# Patient Record
Sex: Male | Born: 1950 | Race: Black or African American | Hispanic: No | Marital: Married | Smoking: Former smoker
Health system: Southern US, Community
[De-identification: ages and names within clinical notes are randomized; demographics above are authoritative.]

## PROBLEM LIST (undated history)

## (undated) DIAGNOSIS — N189 Chronic kidney disease, unspecified: Secondary | ICD-10-CM

## (undated) DIAGNOSIS — I5030 Unspecified diastolic (congestive) heart failure: Secondary | ICD-10-CM

## (undated) DIAGNOSIS — G4733 Obstructive sleep apnea (adult) (pediatric): Secondary | ICD-10-CM

## (undated) DIAGNOSIS — I10 Essential (primary) hypertension: Secondary | ICD-10-CM

## (undated) DIAGNOSIS — I2510 Atherosclerotic heart disease of native coronary artery without angina pectoris: Secondary | ICD-10-CM

## (undated) DIAGNOSIS — R9431 Abnormal electrocardiogram [ECG] [EKG]: Secondary | ICD-10-CM

## (undated) DIAGNOSIS — R37 Sexual dysfunction, unspecified: Secondary | ICD-10-CM

## (undated) HISTORY — PX: CARDIAC CATHETERIZATION: SHX172

## (undated) HISTORY — DX: Unspecified diastolic (congestive) heart failure: I50.30

## (undated) HISTORY — DX: Essential (primary) hypertension: I10

## (undated) HISTORY — DX: Obstructive sleep apnea (adult) (pediatric): G47.33

## (undated) HISTORY — DX: Atherosclerotic heart disease of native coronary artery without angina pectoris: I25.10

## (undated) HISTORY — DX: Abnormal electrocardiogram (ECG) (EKG): R94.31

## (undated) HISTORY — DX: Sexual dysfunction, unspecified: R37

## (undated) HISTORY — PX: CORONARY ANGIOPLASTY: SHX604

## (undated) HISTORY — DX: Chronic kidney disease, unspecified: N18.9

---
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
## 2004-08-20 ENCOUNTER — Ambulatory Visit: Payer: Self-pay | Admitting: Family Medicine

## 2009-03-04 DIAGNOSIS — R9431 Abnormal electrocardiogram [ECG] [EKG]: Secondary | ICD-10-CM

## 2009-03-04 HISTORY — DX: Abnormal electrocardiogram (ECG) (EKG): R94.31

## 2009-03-06 ENCOUNTER — Ambulatory Visit: Payer: Self-pay | Admitting: Cardiovascular Disease

## 2010-04-15 ENCOUNTER — Encounter: Payer: Self-pay | Admitting: Cardiovascular Disease

## 2010-06-16 NOTE — Assessment & Plan Note (Signed)
[facility] OFFICE NOTE   Villa, Douglas                    MRN:          [id number]  DATE:03/06/2009                            DOB:          1950-05-31    CHIEF COMPLAINT:  Shortness of breath and abnormal EKG.   HISTORY OF PRESENT ILLNESS:  Mr. Douglas Villa is a 60 year old black male  with past medical history significant for hypertension, hyperlipidemia,  presenting upon referral from primary care with abnormal EKG and  occasional shortness of breath with exertion.  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  There is no associated chest  discomfort, diaphoresis, or nausea.  He was seen by primary care, who  noticed T-wave inversions in some of the precordial leads and  questionable Q-waves inferiorly.   PAST MEDICAL HISTORY:  As above in HPI.  The patient has hypogonadism  and is pre-diabetic.   SOCIAL HISTORY:  The patient smokes about a pack of cigarettes a day.  He states he was able to quit for 5 years in the past, but then  restarted smoking.  Alcohol; minimal alcohol use.   FAMILY HISTORY:  Negative for premature coronary artery disease.   ALLERGIES:  PENICILLIN.   MEDICATIONS:  1. Aspirin 325 daily.  2. Zocor 40 mg nightly.  3. Hydrochlorothiazide 25 mg daily.  4. Amlodipine/?10/20 mg daily.  5. Potassium 10 mg daily.  6. Multivitamins daily.   REVIEW OF SYSTEMS:  As in HPI.  The patient also has endorsed erectile  problems and poor diet.  Other systems are negative.   PHYSICAL EXAMINATION:  VITAL SIGNS:  His blood pressure 157/96 in the  right arm, 149/80 in the left arm, pulse 63, sating 98% on room air, and  he weighs 217 pounds.  GENERAL:  No acute distress.  HEENT:  Normocephalic, atraumatic.  NECK:  Supple.  HEART:  Regular rate and rhythm without murmur, rub, or gallop.  LUNGS:   Clear bilaterally.  ABDOMEN:  Soft, nontender, nondistended.  EXTREMITIES:  Without edema.  SKIN:  Warm and dry.   EKG independently interpreted by myself demonstrates normal sinus  rhythm.  There is T-wave inversion in V4-V5 and baseline artifact in V6.   ASSESSMENT:  This is a 60 year old gentleman with a multiple risk  factors for coronary artery disease presenting with a nonspecific  findings on EKG that could represent ischemia.  Symptomatically, the  patient has some dyspnea which is likely secondary to deconditioning.   PLAN:  We will proceed with a exercise stress echocardiogram in order to  rule out inducible ischemia and ensure normal left ventricular systolic  function.  The patient is advised that if he were to experience any new-  onset chest, arm, or jaw discomfort or if he were to become acutely  short of breath, he should call 9-1-1.  We will contact the patient once  the results of the stress test are obtained.     Arlee Muslim, MD  Electronically Signed    SGA/MedQ  DD: 03/06/2009  DT: 03/07/2009  Job #: [id number]

## 2010-06-16 NOTE — Letter (Signed)
March 06, 2009    Dr. Reinaldo Meeker  [address], [place]  [address]   RE:  Douglas Villa, Douglas Villa  MRN:  [id number]  /  DOB:  05-Sep-1950   Dear Dr. Henrene Pastor:   I had the pleasure of seeing Douglas Villa in clinic this morning.  As  you know he is a 60 year old gentleman with multiple risk factors for  coronary disease who has some T-wave inversions in the precordial leads  on his EKG.  In speaking with the patient he does get dyspneic with  exertion but denies any chest discomfort.  I suspect that his dyspnea is  from physical deconditioning.  Because of the abnormal EKG I will  proceed with exercise echocardiogram in order to assure normal left  ventricular systolic function and rule out inducible ischemia.   Thank you for the referral of this patient and please contact my office  if I can be of further assistance.    Sincerely,      Arlee Muslim, MD  Electronically Signed    SGA/MedQ  DD: 03/06/2009  DT: 03/06/2009  Job #: [id number]

## 2017-06-03 ENCOUNTER — Ambulatory Visit (INDEPENDENT_AMBULATORY_CARE_PROVIDER_SITE_OTHER): Payer: Self-pay | Admitting: Cardiology

## 2017-06-03 ENCOUNTER — Encounter: Payer: Self-pay | Admitting: Cardiology

## 2017-06-03 VITALS — BP 118/72 | HR 50 | Ht 67.0 in | Wt 198.1 lb

## 2017-06-03 DIAGNOSIS — E785 Hyperlipidemia, unspecified: Secondary | ICD-10-CM | POA: Insufficient documentation

## 2017-06-03 DIAGNOSIS — Z87891 Personal history of nicotine dependence: Secondary | ICD-10-CM

## 2017-06-03 DIAGNOSIS — N183 Chronic kidney disease, stage 3 unspecified: Secondary | ICD-10-CM | POA: Insufficient documentation

## 2017-06-03 DIAGNOSIS — C61 Malignant neoplasm of prostate: Secondary | ICD-10-CM

## 2017-06-03 DIAGNOSIS — I10 Essential (primary) hypertension: Secondary | ICD-10-CM | POA: Insufficient documentation

## 2017-06-03 DIAGNOSIS — R0602 Shortness of breath: Secondary | ICD-10-CM

## 2017-06-03 DIAGNOSIS — E782 Mixed hyperlipidemia: Secondary | ICD-10-CM | POA: Insufficient documentation

## 2017-06-03 DIAGNOSIS — I5023 Acute on chronic systolic (congestive) heart failure: Secondary | ICD-10-CM

## 2017-06-03 DIAGNOSIS — I2510 Atherosclerotic heart disease of native coronary artery without angina pectoris: Secondary | ICD-10-CM

## 2017-06-03 DIAGNOSIS — I25110 Atherosclerotic heart disease of native coronary artery with unstable angina pectoris: Secondary | ICD-10-CM | POA: Insufficient documentation

## 2017-06-03 NOTE — Addendum Note (Signed)
Addended by: Mattie Marlin on: 06/03/2017 10:48 AM   Modules accepted: Orders

## 2017-06-03 NOTE — Patient Instructions (Signed)
Medication Instructions:  Your physician recommends that you continue on your current medications as directed. Please refer to the Current Medication list given to you today.  Labwork: None  Testing/Procedures: None  Follow-Up: Your physician recommends that you schedule a follow-up appointment in: 4 weeks  Any Other Special Instructions Will Be Listed Below (If Applicable).   Referral order has been placed for pulmonary. Their office should be contacting you regarding an appointment. If you do not hear from their office within one week please feel free to call our office.  If you need a refill on your cardiac medications before your next appointment, please call your pharmacy.   [place], RN, BSN

## 2017-06-03 NOTE — Progress Notes (Signed)
Cardiology Office Note:    Date:  06/03/2017   ID:  Douglas Villa, DOB May 04, 1950, MRN [id number]  PCP:  Lillard Anes, MD  Cardiologist:  Jenean Lindau, MD   Referring MD: Lillard Anes,*    ASSESSMENT:    1. Hyperlipidemia, unspecified hyperlipidemia type   2. Hypercholesteremia   3. Hypertension, unspecified type   4. Acute on chronic systolic heart failure ([place])   5. Chronic kidney disease, stage 3 ([place])   6. Hypokalemia   7. 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[place])    PLAN:    In order of problems listed above:  1. Patient has multiple complex medical problems.  Secondary prevention stressed to the patient.  Importance of compliance with diet and medications stressed and he vocalized understanding.  His blood pressure is stable.  I told him never to go back to smoking.  Diet was discussed with dyslipidemia diabetes mellitus and obesity she was followed by his primary care physician. 2. He is chronically insufficiency again this is monitored closely by his primary care doctor.  The patient has had his diuretic changed to furosemide for shortness of breath and pedal edema and is already feeling much better. 3. At this time I do not see any reason for any acute intervention.  I will try to get copy of the cardiac records from the hospital where he underwent stenting.  I would also like to see if there is an echocardiogram to assess his wall motion and ejection fraction. 4. He will be seen in follow-up appointment in 3 to 4 weeks or earlier if he has any concerns.  Congestive heart failure education was given and also diet and salt indications were discussed.  He knows to go to the nearest emergency room for any concerning symptoms. 5. He has a long-standing history of smoking and has shortness of breath on exertion and that would like to see a pulmonologist and we will make him an appointment with pulmonologist..  He is agreeable to this.   Medication  Adjustments/Labs and Tests Ordered: Current medicines are reviewed at length with the patient today.  Concerns regarding medicines are outlined above.  No orders of the defined types were placed in this encounter.  No orders of the defined types were placed in this encounter.    History of Present Illness:    Douglas Villa is a 67 y.o. male who is being seen today for the evaluation of coronary artery disease post recent coronary stenting in March of this year.  At the request of Lillard Anes,*.  Patient is a pleasant 67 year old male.  He has past medical history of essential hypertension, dyslipidemia, diabetes mellitus and smoking.  He tells me that he quit smoking in the month of February or March.  He went to the hospital admitted and underwent coronary stenting.  He also has significant renal insufficiency.  He is here for follow-up for this and to get established.  No chest pain or orthopnea or PND.  He has long-standing bilateral pedal edema.  He has some shortness of breath on exertion.  His medicine was changed from hydrochlorothiazide to furosemide and he is taken his first dose this morning and feels much better.  At the time of my evaluation, the patient is alert awake oriented and in no distress.  Past Medical History:  Diagnosis Date  . Abnormal EKG 03/2009  . Hypertension   . Sexual dysfunction   . SOB (shortness of breath)  Past Surgical History:  Procedure Laterality Date  . APPENDECTOMY  1969    Current Medications: Current Meds  Medication Sig  . ACCU-CHEK FASTCLIX LANCETS MISC by Does not apply route.  Marland Kitchen aspirin 325 MG tablet Take 325 mg by mouth daily.    Marland Kitchen atorvastatin (LIPITOR) 40 MG tablet Take 1 tablet by mouth daily.  . Blood Glucose Monitoring Suppl (ACCU-CHEK AVIVA PLUS) w/Device KIT by Does not apply route.  . carvedilol (COREG) 12.5 MG tablet Take 1 tablet by mouth daily.  . furosemide (LASIX) 40 MG tablet Take 1 tablet by mouth 2 (two)  times daily.   Marland Kitchen glucose blood test strip 1 each by Other route as needed. Use as instructed  . Multiple Vitamin (MULTIVITAMIN) capsule Take 1 capsule by mouth daily.    . nitroGLYCERIN (NITROSTAT) 0.4 MG SL tablet Place 0.4 mg under the tongue every 5 (five) minutes as needed for chest pain.  . [DISCONTINUED] amLODipine-benazepril (LOTREL) 10-20 MG per capsule Take 1 capsule by mouth daily.    . [DISCONTINUED] hydrochlorothiazide (HYDRODIURIL) 25 MG tablet Take 25 mg by mouth daily.  . [DISCONTINUED] lisinopril (PRINIVIL,ZESTRIL) 10 MG tablet Take 10 mg by mouth daily.     Allergies:   Penicillins   Social History   Socioeconomic History  . Marital status: Single    Spouse name: Not on file  . Number of children: Not on file  . Years of education: Not on file  . Highest education level: Not on file  Occupational History  . Not on file  Social Needs  . Financial resource strain: Not on file  . Food insecurity:    Worry: Not on file    Inability: Not on file  . Transportation needs:    Medical: Not on file    Non-medical: Not on file  Tobacco Use  . Smoking status: Former Smoker    Packs/day: 1.00    Years: 40.00    Pack years: 40.00    Types: Cigarettes  . Smokeless tobacco: Never Used  Substance and Sexual Activity  . Alcohol use: No  . Drug use: No  . Sexual activity: Not on file  Lifestyle  . Physical activity:    Days per week: Not on file    Minutes per session: Not on file  . Stress: Not on file  Relationships  . Social connections:    Talks on phone: Not on file    Gets together: Not on file    Attends religious service: Not on file    Active member of club or organization: Not on file    Attends meetings of clubs or organizations: Not on file    Relationship status: Not on file  Other Topics Concern  . Not on file  Social History Narrative  . Not on file     Family History: The patient's family history includes Cancer (age of onset: 58) in his  mother; Stroke (age of onset: 78) in his father.  ROS:   Please see the history of present illness.    All other systems reviewed and are negative.  EKGs/Labs/Other Studies Reviewed:    The following studies were reviewed today: EKG revealed sinus rhythm and nonspecific ST-T changes.   Recent Labs: No results found for requested labs within last 8760 hours.  Recent Lipid Panel No results found for: CHOL, TRIG, HDL, CHOLHDL, VLDL, LDLCALC, LDLDIRECT  Physical Exam:    VS:  BP 118/72 (BP Location: Left Arm, Patient Position: Sitting, Cuff  Size: Normal)   Pulse (!) 50   Ht _0  (1.702 m)   Wt 198 lb 1.9 oz (89.9 kg)   SpO2 98%   BMI 31.03 kg/m     Wt Readings from Last 3 Encounters:  06/03/17 198 lb 1.9 oz (89.9 kg)     GEN: Patient is in no acute distress HEENT: Normal NECK: No JVD; No carotid bruits LYMPHATICS: No lymphadenopathy CARDIAC: S1 S2 regular, 2/6 systolic murmur at the apex. RESPIRATORY:  Clear to auscultation without rales, wheezing or rhonchi  ABDOMEN: Soft, non-tender, non-distended MUSCULOSKELETAL:  No edema; No deformity  SKIN: Warm and dry NEUROLOGIC:  Alert and oriented x 3 PSYCHIATRIC:  Normal affect    Signed, Jenean Lindau, MD  06/03/2017 10:38 AM    [facility]

## 2017-06-30 ENCOUNTER — Ambulatory Visit (INDEPENDENT_AMBULATORY_CARE_PROVIDER_SITE_OTHER): Payer: Self-pay | Admitting: Pulmonary Disease

## 2017-06-30 ENCOUNTER — Encounter: Payer: Self-pay | Admitting: Pulmonary Disease

## 2017-06-30 ENCOUNTER — Ambulatory Visit (HOSPITAL_BASED_OUTPATIENT_CLINIC_OR_DEPARTMENT_OTHER)
Admission: RE | Admit: 2017-06-30 | Discharge: 2017-06-30 | Disposition: A | Discharge status: Home / Self Care | Payer: Self-pay | Source: Ambulatory Visit | Attending: Pulmonary Disease | Admitting: Pulmonary Disease

## 2017-06-30 VITALS — BP 112/74 | HR 81 | Ht 67.0 in | Wt 184.0 lb

## 2017-06-30 DIAGNOSIS — R0609 Other forms of dyspnea: Secondary | ICD-10-CM

## 2017-06-30 DIAGNOSIS — J90 Pleural effusion, not elsewhere classified: Secondary | ICD-10-CM | POA: Insufficient documentation

## 2017-06-30 DIAGNOSIS — R0602 Shortness of breath: Secondary | ICD-10-CM | POA: Insufficient documentation

## 2017-06-30 DIAGNOSIS — Z87891 Personal history of nicotine dependence: Secondary | ICD-10-CM | POA: Insufficient documentation

## 2017-06-30 DIAGNOSIS — I509 Heart failure, unspecified: Secondary | ICD-10-CM | POA: Insufficient documentation

## 2017-06-30 DIAGNOSIS — R918 Other nonspecific abnormal finding of lung field: Secondary | ICD-10-CM | POA: Insufficient documentation

## 2017-06-30 NOTE — Patient Instructions (Signed)
Will arrange for chest xray and pulmonary function test Please get a copy of your sleep study and most recent CPAP report Follow up in 2 weeks with Dr. Halford Chessman or Nurse Practitioner

## 2017-06-30 NOTE — Progress Notes (Signed)
   Subjective:    Patient ID: Douglas Villa, male    DOB: 1950/03/02, 67 y.o.   MRN: [id number]  HPI    Review of Systems  Constitutional: Positive for appetite change and unexpected weight change. Negative for fever.  HENT: Positive for congestion, sinus pressure and sneezing. Negative for dental problem, ear pain, nosebleeds, postnasal drip, rhinorrhea, sore throat and trouble swallowing.   Eyes: Negative for redness and itching.  Respiratory: Positive for cough, chest tightness, shortness of breath and wheezing.   Cardiovascular: Positive for leg swelling. Negative for palpitations.  Gastrointestinal: Negative for nausea and vomiting.  Genitourinary: Negative for dysuria.  Musculoskeletal: Positive for joint swelling.  Skin: Negative for rash.  Allergic/Immunologic: Positive for environmental allergies. Negative for food allergies and immunocompromised state.  Neurological: Negative for headaches.  Hematological: Does not bruise/bleed easily.  Psychiatric/Behavioral: Negative for dysphoric mood. The patient is not nervous/anxious.        Objective:   Physical Exam        Assessment & Plan:

## 2017-06-30 NOTE — Progress Notes (Signed)
[facility]  Chief Complaint  Patient presents with  . pulm consult    Pt referred by Dr. Jyl Heinz MD for SOB. Pt has increase of SOB, wheezing, productive cough- not sure of color, and some chest tightness.     Vital signs: BP 112/74 (BP Location: Left Arm, Cuff Size: Normal)   Pulse 81   Ht _0  (1.702 m)   Wt 184 lb (83.5 kg)   SpO2 98%   BMI 28.82 kg/m   History of Present Illness: Douglas Villa is a 67 y.o. male former smoker with dyspnea.  He has noticed trouble with his breathing for a while.  He used to work as Administrator.  He was in [place].  He developed severe shortness of breath.  He was hospitalized for CHF from March 1 through the beginning of April.  He had cardiac cath with stent placement.  He is still having trouble with his breathing after returning home.  He quit smoking when he was in hospital.  He is not aware of history of asthma or allergies.  Hasn't had PFT before.  He gets episodes of cough, wheeze, and chest tightness.  He brings up milky sputum.  Denies hemoptysis.  Hasn't tried inhalers before.  He is from [place].  Never in [employer] [place]  Has pet dog.  Had pneumonia with "lung collapse" about 2 yrs ago.  No history of TB.  He had sleep study through his work about 5 yrs ago.  Found to have sleep apnea.  He has been using CPAP.  He isn't sure about settings.  He tries to use nightly.  He still wakes up at night feeling like he can't catch his breath.   Physical Exam:  General - pleasant Eyes - pupils reactive ENT - no sinus tenderness, no oral exudate, no LAN Cardiac - regular, no murmur Chest - no wheeze, rales Abd - soft, non tender Ext - 2+ edema, wearing compression stockings Skin - no rashes Neuro - normal strength Psych - normal mood   Discussion: He has prior history of smoking.  He has persistent symptoms of cough, wheeze, sputum, and dyspnea on exertion.  I am concerned he could  have obstructive lung disease.  He also has history of sleep apnea on CPAP.  He is still having difficulty with his breathing while asleep.    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 - will arrange for Chest xray and pulmonary function testing  Obstructive sleep apnea. - will try to get copy of his sleep study and most recent CPAP download  HFpEF, CAD, HLD. - he has f/u with cardiology later this month   Patient Instructions  Will arrange for chest xray and pulmonary function test Please get a copy of your sleep study and most recent CPAP report Follow up in 2 weeks with Dr. Halford Chessman or Nurse Practitioner    Chesley Mires, MD [place] 06/30/2017, 10:47 AM  Flow Sheet  Pulmonary tests:  Sleep tests:  Cardiac tests:  Review of Systems: Constitutional: Positive for appetite change and unexpected weight change. Negative for fever.  HENT: Positive for congestion, sinus pressure and sneezing. Negative for dental problem, ear pain, nosebleeds, postnasal drip, rhinorrhea, sore throat and trouble swallowing.   Eyes: Negative for redness and itching.  Respiratory: Positive for cough, chest tightness, shortness of breath and wheezing.   Cardiovascular: Positive for leg swelling. Negative for palpitations.  Gastrointestinal: Negative  for nausea and vomiting.  Genitourinary: Negative for dysuria.  Musculoskeletal: Positive for joint swelling.  Skin: Negative for rash.  Allergic/Immunologic: Positive for environmental allergies. Negative for food allergies and immunocompromised state.  Neurological: Negative for headaches.  Hematological: Does not bruise/bleed easily.  Psychiatric/Behavioral: Negative for dysphoric mood. The patient is not nervous/anxious.    Past Medical History: He  has a past medical history of Abnormal EKG (03/2009), CAD (coronary artery disease), CKD (chronic kidney disease), Heart failure with  preserved left ventricular function (HFpEF) ([place]), Hypertension, OSA (obstructive sleep apnea), and Sexual dysfunction.  Past Surgical History: He  has a past surgical history that includes Appendectomy (1969).  Family History: His family history includes Cancer (age of onset: 31) in his mother; Stroke (age of onset: 55) in his father.  Social History: He  reports that he quit smoking about 2 months ago. His smoking use included cigarettes. He has a 50.00 pack-year smoking history. He has never used smokeless tobacco. He reports that he does not drink alcohol or use drugs.  Medications: Allergies as of 06/30/2017      Reactions   Penicillins    Sweating      Medication List        Accurate as of 06/30/17 10:47 AM. Always use your most recent med list.          ACCU-CHEK AVIVA PLUS w/Device Kit by Does not apply route.   ACCU-CHEK FASTCLIX LANCETS Misc by Does not apply route.   aspirin 325 MG tablet Take 325 mg by mouth daily.   atorvastatin 40 MG tablet Commonly known as:  LIPITOR Take 1 tablet by mouth daily.   carvedilol 12.5 MG tablet Commonly known as:  COREG Take 1 tablet by mouth daily.   furosemide 40 MG tablet Commonly known as:  LASIX Take 1 tablet by mouth 2 (two) times daily.   glucose blood test strip 1 each by Other route as needed. Use as instructed   multivitamin capsule Take 1 capsule by mouth daily.   nitroGLYCERIN 0.4 MG SL tablet Commonly known as:  NITROSTAT Place 0.4 mg under the tongue every 5 (five) minutes as needed for chest pain.

## 2017-07-04 ENCOUNTER — Ambulatory Visit (INDEPENDENT_AMBULATORY_CARE_PROVIDER_SITE_OTHER): Payer: Self-pay | Admitting: Cardiology

## 2017-07-04 ENCOUNTER — Encounter: Payer: Self-pay | Admitting: Cardiology

## 2017-07-04 VITALS — BP 130/82 | HR 72 | Ht 67.0 in | Wt 190.8 lb

## 2017-07-04 DIAGNOSIS — I5023 Acute on chronic systolic (congestive) heart failure: Secondary | ICD-10-CM

## 2017-07-04 DIAGNOSIS — I10 Essential (primary) hypertension: Secondary | ICD-10-CM

## 2017-07-04 DIAGNOSIS — Z87891 Personal history of nicotine dependence: Secondary | ICD-10-CM

## 2017-07-04 DIAGNOSIS — E782 Mixed hyperlipidemia: Secondary | ICD-10-CM

## 2017-07-04 DIAGNOSIS — N183 Chronic kidney disease, stage 3 unspecified: Secondary | ICD-10-CM

## 2017-07-04 DIAGNOSIS — I2510 Atherosclerotic heart disease of native coronary artery without angina pectoris: Secondary | ICD-10-CM

## 2017-07-04 NOTE — Progress Notes (Signed)
Cardiology Office Note:    Date:  07/04/2017   ID:  Douglas Villa, DOB 12-04-1950, MRN [id number]  PCP:  Lillard Anes, MD  Cardiologist:  Jenean Lindau, MD   Referring MD: Lillard Anes,*    ASSESSMENT:    1. Acute on chronic systolic heart failure ([place])   2. Essential hypertension   3. Coronary artery disease involving native coronary artery of native heart without angina pectoris   4. Chronic kidney disease, stage 3 ([place])   5. Mixed hyperlipidemia   6. Mixed dyslipidemia   7. Ex-smoker    PLAN:    In order of problems listed above:  1. Secondary prevention stressed with the patient.  Importance of compliance with diet and medications stressed and he vocalized understanding.  Congestive heart failure education was given to the patient. 2. After the patient left my office I received records from the hospital in [place] and reviewed them at length.  He has undergone coronary stenting for coronary artery disease.  His systolic function is severely depressed at 20 to 25%.  We will get the patient back for an echocardiogram to assess if there is any improvement from the month of March.  Based on this we will evaluate him for referral for electrophysiologic studies. 3. Be back for a Chem-7, blood pressure and weight check on Friday.  He knows to go to the nearest emergency room for any significant concerns.   Medication Adjustments/Labs and Tests Ordered: Current medicines are reviewed at length with the patient today.  Concerns regarding medicines are outlined above.  Orders Placed This Encounter  Procedures  . ECHOCARDIOGRAM COMPLETE   No orders of the defined types were placed in this encounter.    Chief Complaint  Patient presents with  . Follow-up    1 month follow up      History of Present Illness:    CHRSITOPHER Villa is a 67 y.o. male.  The patient was evaluated by me for symptoms suggesting of congestive heart failure.  I saw the patient  today.  He went to the emergency room and his diuretic was increased.  He had some issues of shortness of breath he took his first dose today and is feeling better.  He denies any chest pain orthopnea or PND.  He clinically appears comfortable.  Past Medical History:  Diagnosis Date  . Abnormal EKG 03/2009  . CAD (coronary artery disease)   . CKD (chronic kidney disease)   . Heart failure with preserved left ventricular function (HFpEF) ([place])   . Hypertension   . OSA (obstructive sleep apnea)   . Sexual dysfunction     Past Surgical History:  Procedure Laterality Date  . APPENDECTOMY  1969  . CARDIAC CATHETERIZATION    . CORONARY ANGIOPLASTY      Current Medications: Current Meds  Medication Sig  . ACCU-CHEK FASTCLIX LANCETS MISC by Does not apply route.  Marland Kitchen aspirin 325 MG tablet Take 325 mg by mouth daily.    Marland Kitchen atorvastatin (LIPITOR) 40 MG tablet Take 1 tablet by mouth daily.  . Blood Glucose Monitoring Suppl (ACCU-CHEK AVIVA PLUS) w/Device KIT by Does not apply route.  . carvedilol (COREG) 12.5 MG tablet Take 1 tablet by mouth daily.  . furosemide (LASIX) 40 MG tablet Take 2 tablets by mouth 2 (two) times daily.   Marland Kitchen glucose blood test strip 1 each by Other route as needed. Use as instructed  . Multiple Vitamin (MULTIVITAMIN) capsule Take 1 capsule  by mouth daily.    . nitroGLYCERIN (NITROSTAT) 0.4 MG SL tablet Place 0.4 mg under the tongue every 5 (five) minutes as needed for chest pain.  . potassium chloride SA (K-DUR,KLOR-CON) 20 MEQ tablet Take 40 mEq by mouth daily.     Allergies:   Penicillins   Social History   Socioeconomic History  . Marital status: Single    Spouse name: Not on file  . Number of children: Not on file  . Years of education: Not on file  . Highest education level: Not on file  Occupational History  . Not on file  Social Needs  . Financial resource strain: Not on file  . Food insecurity:    Worry: Not on file    Inability: Not on file  .  Transportation needs:    Medical: Not on file    Non-medical: Not on file  Tobacco Use  . Smoking status: Former Smoker    Packs/day: 1.00    Years: 50.00    Pack years: 50.00    Types: Cigarettes    Last attempt to quit: 03/31/2017    Years since quitting: 0.2  . Smokeless tobacco: Never Used  Substance and Sexual Activity  . Alcohol use: No  . Drug use: No  . Sexual activity: Not on file  Lifestyle  . Physical activity:    Days per week: Not on file    Minutes per session: Not on file  . Stress: Not on file  Relationships  . Social connections:    Talks on phone: Not on file    Gets together: Not on file    Attends religious service: Not on file    Active member of club or organization: Not on file    Attends meetings of clubs or organizations: Not on file    Relationship status: Not on file  Other Topics Concern  . Not on file  Social History Narrative  . Not on file     Family History: The patient's family history includes Cancer (age of onset: 31) in his mother; Stroke (age of onset: 77) in his father.  ROS:   Please see the history of present illness.    All other systems reviewed and are negative.  EKGs/Labs/Other Studies Reviewed:    The following studies were reviewed today: I discussed the findings of my evaluation with the patient today.   Recent Labs: No results found for requested labs within last 8760 hours.  Recent Lipid Panel No results found for: CHOL, TRIG, HDL, CHOLHDL, VLDL, LDLCALC, LDLDIRECT  Physical Exam:    VS:  BP 130/82 (BP Location: Left Arm, Patient Position: Sitting, Cuff Size: Normal)   Pulse 72   Ht '5\' 7"'$  (1.702 m)   Wt 190 lb 12.8 oz (86.5 kg)   SpO2 99%   BMI 29.88 kg/m     Wt Readings from Last 3 Encounters:  07/04/17 190 lb 12.8 oz (86.5 kg)  06/30/17 184 lb (83.5 kg)  06/03/17 198 lb 1.9 oz (89.9 kg)     GEN: Patient is in no acute distress HEENT: Normal NECK: No JVD; No carotid bruits LYMPHATICS: No  lymphadenopathy CARDIAC: Hear sounds regular, 2/6 systolic murmur at the apex. RESPIRATORY:  Clear to auscultation without rales, wheezing or rhonchi  ABDOMEN: Soft, non-tender, non-distended MUSCULOSKELETAL:  No edema; No deformity  SKIN: Warm and dry NEUROLOGIC:  Alert and oriented x 3 PSYCHIATRIC:  Normal affect   Signed, Jenean Lindau, MD  07/04/2017 11:33 AM  [facility]

## 2017-07-04 NOTE — Patient Instructions (Addendum)
Medication Instructions:  Your physician recommends that you continue on your current medications as directed. Please refer to the Current Medication list given to you today.  Labwork: None  Testing/Procedures: Your physician has requested that you have an echocardiogram. Echocardiography is a painless test that uses sound waves to create images of your heart. It provides your doctor with information about the size and shape of your heart and how well your heart's chambers and valves are working. This procedure takes approximately one hour. There are no restrictions for this procedure.  Follow-Up: Your physician recommends that you schedule a follow-up appointment in: 1 month  Please return on Thursday for pulse, blood pressure, weight, and BMP  Any Other Special Instructions Will Be Listed Below (If Applicable).     If you need a refill on your cardiac medications before your next appointment, please call your pharmacy.   [place], RN, BSN  Echocardiogram An echocardiogram, or echocardiography, uses sound waves (ultrasound) to produce an image of your heart. The echocardiogram is simple, painless, obtained within a short period of time, and offers valuable information to your health care provider. The images from an echocardiogram can provide information such as:  Evidence of coronary artery disease (CAD).  Heart size.  Heart muscle function.  Heart valve function.  Aneurysm detection.  Evidence of a past heart attack.  Fluid buildup around the heart.  Heart muscle thickening.  Assess heart valve function.  Tell a health care provider about:  Any allergies you have.  All medicines you are taking, including vitamins, herbs, eye drops, creams, and over-the-counter medicines.  Any problems you or family members have had with anesthetic medicines.  Any blood disorders you have.  Any surgeries you have had.  Any medical conditions you  have.  Whether you are pregnant or may be pregnant. What happens before the procedure? No special preparation is needed. Eat and drink normally. What happens during the procedure?  In order to produce an image of your heart, gel will be applied to your chest and a wand-like tool (transducer) will be moved over your chest. The gel will help transmit the sound waves from the transducer. The sound waves will harmlessly bounce off your heart to allow the heart images to be captured in real-time motion. These images will then be recorded.  You may need an IV to receive a medicine that improves the quality of the pictures. What happens after the procedure? You may return to your normal schedule including diet, activities, and medicines, unless your health care provider tells you otherwise. This information is not intended to replace advice given to you by your health care provider. Make sure you discuss any questions you have with your health care provider. Document Released: 01/16/2000 Document Revised: 09/06/2015 Document Reviewed: 09/25/2012 Elsevier Interactive Patient Education  2017 [employer].

## 2017-07-07 ENCOUNTER — Ambulatory Visit (INDEPENDENT_AMBULATORY_CARE_PROVIDER_SITE_OTHER): Payer: Self-pay | Admitting: Cardiology

## 2017-07-07 ENCOUNTER — Ambulatory Visit: Payer: Self-pay

## 2017-07-07 VITALS — BP 126/72 | HR 82 | Wt 191.0 lb

## 2017-07-07 DIAGNOSIS — I10 Essential (primary) hypertension: Secondary | ICD-10-CM

## 2017-07-07 NOTE — Patient Instructions (Signed)
Medication Instructions:  Your physician recommends that you continue on your current medications as directed. Please refer to the Current Medication list given to you today.   Labwork: Your physician recommends that you return for lab work today: BMP   Testing/Procedures: None  Follow-Up: Your physician recommends that you schedule a follow-up appointment in: Keep current appointment    Any Other Special Instructions Will Be Listed Below (If Applicable).     If you need a refill on your cardiac medications before your next appointment, please call your pharmacy.     Cooking With Less Salt Cooking with less salt is one way to reduce the amount of sodium you get from food. Depending on your condition and overall health, your health care provider or diet and nutrition specialist (dietitian) may recommend that you reduce your sodium intake. Most people should have less than 2,300 milligrams (mg) of sodium each day. If you have high blood pressure (hypertension), you may need to limit your sodium to 1,500 mg each day. Follow the tips below to help reduce your sodium intake. What do I need to know about cooking with less salt? Shopping  Buy sodium-free or low-sodium products. Look for the following words on food labels: ? Low-sodium. ? Sodium-free. ? Reduced-sodium. ? No salt added. ? Unsalted.  Buy fresh or frozen vegetables. Avoid canned vegetables.  Avoid buying meats or protein foods that have been injected with broth or saline solution.  Avoid cured or smoked meats, such as hot dogs, bacon, salami, ham, and bologna. Reading food labels  Check the food label before buying or using packaged ingredients.  Look for products with no more than 140 mg of sodium in one serving.  Do not choose foods with salt as one of the first three ingredients on the ingredients list. If salt is one of the first three ingredients, it usually means the item is high in sodium, because  ingredients are listed in order of amount in the food item. Cooking  Use herbs, seasonings without salt, and spices as substitutes for salt in foods.  Use sodium-free baking soda when baking.  Grill, braise, or roast foods to add flavor with less salt.  Avoid adding salt to pasta, rice, or hot cereals while cooking.  Drain and rinse canned vegetables before use.  Avoid adding salt when cooking sweets and desserts.  Cook with low-sodium ingredients. What are some salt alternatives? The following are herbs, seasonings, and spices that can be used instead of salt to give taste to your food. Herbs should be fresh or dried. Do not choose packaged mixes. Next to the name of the herb, spice, or seasoning are some examples of foods you can pair it with. Herbs  Bay leaves - Soups, meat and vegetable dishes, and spaghetti sauce.  Basil - [employer], soups, pasta, and fish dishes.  Cilantro - Meat, poultry, and vegetable dishes.  Chili powder - Marinades and Mexican dishes.  Chives - Salad dressings and potato dishes.  Cumin - Mexican dishes, couscous, and meat dishes.  Dill - Fish dishes, sauces, and salads.  Fennel - Meat and vegetable dishes, breads, and cookies.  Garlic (do not use garlic salt) - [place] dishes, meat dishes, salad dressings, and sauces.  Marjoram - Soups, potato dishes, and meat dishes.  Oregano - Pizza and spaghetti sauce.  Parsley - Salads, soups, pasta, and meat dishes.  Rosemary - [place] dishes, salad dressings, soups, and red meats.  Saffron - Fish dishes, pasta, and some poultry  dishes.  Sage - Stuffings and sauces.  Tarragon - Fish and [employer].  Thyme - Stuffing, meat, and fish dishes. Seasonings  Lemon juice - Fish dishes, poultry dishes, vegetables, and salads.  Vinegar - Salad dressings, vegetables, and fish dishes. Spices  Cinnamon - Sweet dishes, such as cakes, cookies, and puddings.  Cloves - Gingerbread, puddings, and  marinades for meats.  Curry - Vegetable dishes, fish and poultry dishes, and stir-fry dishes.  Ginger - Vegetables dishes, fish dishes, and stir-fry dishes.  Nutmeg - Pasta, vegetables, poultry, fish dishes, and custard. What are some low-sodium ingredients and foods?  Fresh or frozen fruits and vegetables with no sauce added.  Fresh or frozen whole meats, poultry, and fish with no sauce added.  Eggs.  Noodles, pasta, quinoa, rice.  Shredded or puffed wheat or puffed rice.  Regular or quick oats.  Milk, yogurt, hard cheeses, and low-sodium cheeses. Good cheese choices include Swiss, [place]. Always check the label for the serving size and sodium content.  Unsalted butter or margarine.  Unsalted nuts.  Sherbet or ice cream (keep to  cup per serving).  Homemade pudding.  Sodium-free baking soda and baking powder. This is not a complete list of low-sodium ingredients and foods. Contact your dietitian for more options. Summary  Cooking with less salt is one way to reduce the amount of sodium that you get from food.  Buy sodium-free or low-sodium products.  Check the food label before using or buying packaged ingredients.  Use herbs, seasonings without salt, and spices as substitutes for salt in foods. This information is not intended to replace advice given to you by your health care provider. Make sure you discuss any questions you have with your health care provider. Document Released: 01/18/2005 Document Revised: 01/27/2016 Document Reviewed: 01/27/2016 Elsevier Interactive Patient Education  2017 [place] DASH stands for "Dietary Approaches to Stop Hypertension." The DASH eating plan is a healthy eating plan that has been shown to reduce high blood pressure (hypertension). It may also reduce your risk for type 2 diabetes, heart disease, and stroke. The DASH eating plan may also help with weight loss. What are tips for  following this plan? General guidelines  Avoid eating more than 2,300 mg (milligrams) of salt (sodium) a day. If you have hypertension, you may need to reduce your sodium intake to 1,500 mg a day.  Limit alcohol intake to no more than 1 drink a day for nonpregnant women and 2 drinks a day for men. One drink equals 12 oz of beer, 5 oz of wine, or 1 oz of hard liquor.  Work with your health care provider to maintain a healthy body weight or to lose weight. Ask what an ideal weight is for you.  Get at least 30 minutes of exercise that causes your heart to beat faster (aerobic exercise) most days of the week. Activities may include walking, swimming, or biking.  Work with your health care provider or diet and nutrition specialist (dietitian) to adjust your eating plan to your individual calorie needs. Reading food labels  Check food labels for the amount of sodium per serving. Choose foods with less than 5 percent of the Daily Value of sodium. Generally, foods with less than 300 mg of sodium per serving fit into this eating plan.  To find whole grains, look for the word "whole" as the first word in the ingredient list. Shopping  Buy products labeled as "low-sodium" or "  no salt added."  Buy fresh foods. Avoid canned foods and premade or frozen meals. Cooking  Avoid adding salt when cooking. Use salt-free seasonings or herbs instead of table salt or sea salt. Check with your health care provider or pharmacist before using salt substitutes.  Do not fry foods. Cook foods using healthy methods such as baking, boiling, grilling, and broiling instead.  Cook with heart-healthy oils, such as olive, canola, soybean, or sunflower oil. Meal planning   Eat a balanced diet that includes: ? 5 or more servings of fruits and vegetables each day. At each meal, try to fill half of your plate with fruits and vegetables. ? Up to 6-8 servings of whole grains each day. ? Less than 6 oz of lean meat,  poultry, or fish each day. A 3-oz serving of meat is about the same size as a deck of cards. One egg equals 1 oz. ? 2 servings of low-fat dairy each day. ? A serving of nuts, seeds, or beans 5 times each week. ? Heart-healthy fats. Healthy fats called Omega-3 fatty acids are found in foods such as flaxseeds and coldwater fish, like sardines, salmon, and mackerel.  Limit how much you eat of the following: ? Canned or prepackaged foods. ? Food that is high in trans fat, such as fried foods. ? Food that is high in saturated fat, such as fatty meat. ? Sweets, desserts, sugary drinks, and other foods with added sugar. ? Full-fat dairy products.  Do not salt foods before eating.  Try to eat at least 2 vegetarian meals each week.  Eat more home-cooked food and less restaurant, buffet, and fast food.  When eating at a restaurant, ask that your food be prepared with less salt or no salt, if possible. What foods are recommended? The items listed may not be a complete list. Talk with your dietitian about what dietary choices are best for you. Grains Whole-grain or whole-wheat bread. Whole-grain or whole-wheat pasta. Brown rice. [place]. Bulgur. Whole-grain and low-sodium cereals. Pita bread. Low-fat, low-sodium crackers. Whole-wheat flour tortillas. Vegetables Fresh or frozen vegetables (raw, steamed, roasted, or grilled). Low-sodium or reduced-sodium tomato and vegetable juice. Low-sodium or reduced-sodium tomato sauce and tomato paste. Low-sodium or reduced-sodium canned vegetables. Fruits All fresh, dried, or frozen fruit. Canned fruit in natural juice (without added sugar). Meat and other protein foods Skinless chicken or [place]. Ground chicken or [place]. Pork with fat trimmed off. Fish and seafood. Egg whites. Dried beans, peas, or lentils. Unsalted nuts, nut butters, and seeds. Unsalted canned beans. Lean cuts of beef with fat trimmed off. Low-sodium, lean deli meat. Dairy Low-fat  (1%) or fat-free (skim) milk. Fat-free, low-fat, or reduced-fat cheeses. Nonfat, low-sodium ricotta or cottage cheese. Low-fat or nonfat yogurt. Low-fat, low-sodium cheese. Fats and oils Soft margarine without trans fats. Vegetable oil. Low-fat, reduced-fat, or light mayonnaise and salad dressings (reduced-sodium). Canola, safflower, olive, soybean, and sunflower oils. Avocado. Seasoning and other foods Herbs. Spices. Seasoning mixes without salt. Unsalted popcorn and pretzels. Fat-free sweets. What foods are not recommended? The items listed may not be a complete list. Talk with your dietitian about what dietary choices are best for you. Grains Baked goods made with fat, such as croissants, muffins, or some breads. Dry pasta or rice meal packs. Vegetables Creamed or fried vegetables. Vegetables in a cheese sauce. Regular canned vegetables (not low-sodium or reduced-sodium). Regular canned tomato sauce and paste (not low-sodium or reduced-sodium). Regular tomato and vegetable juice (not low-sodium or reduced-sodium). Angie Fava. Olives.  Fruits Canned fruit in a light or heavy syrup. Fried fruit. Fruit in cream or butter sauce. Meat and other protein foods Fatty cuts of meat. Ribs. Fried meat. Berniece Salines. Sausage. Bologna and other processed lunch meats. Salami. Fatback. Hotdogs. Bratwurst. Salted nuts and seeds. Canned beans with added salt. Canned or smoked fish. Whole eggs or egg yolks. Chicken or [place] with skin. Dairy Whole or 2% milk, cream, and half-and-half. Whole or full-fat cream cheese. Whole-fat or sweetened yogurt. Full-fat cheese. Nondairy creamers. Whipped toppings. Processed cheese and cheese spreads. Fats and oils Butter. Stick margarine. Lard. Shortening. Ghee. Bacon fat. Tropical oils, such as coconut, palm kernel, or palm oil. Seasoning and other foods Salted popcorn and pretzels. Onion salt, garlic salt, seasoned salt, table salt, and sea salt. Worcestershire sauce. Tartar sauce.  Barbecue sauce. Teriyaki sauce. Soy sauce, including reduced-sodium. Steak sauce. Canned and packaged gravies. Fish sauce. Oyster sauce. Cocktail sauce. Horseradish that you find on the shelf. Ketchup. Mustard. Meat flavorings and tenderizers. Bouillon cubes. Hot sauce and Tabasco sauce. Premade or packaged marinades. Premade or packaged taco seasonings. Relishes. Regular salad dressings. Where to find more information:  National Heart, Lung, and [place]: https://wilson-eaton.com/  American Heart Association: www.heart.org Summary  The DASH eating plan is a healthy eating plan that has been shown to reduce high blood pressure (hypertension). It may also reduce your risk for type 2 diabetes, heart disease, and stroke.  With the DASH eating plan, you should limit salt (sodium) intake to 2,300 mg a day. If you have hypertension, you may need to reduce your sodium intake to 1,500 mg a day.  When on the DASH eating plan, aim to eat more fresh fruits and vegetables, whole grains, lean proteins, low-fat dairy, and heart-healthy fats.  Work with your health care provider or diet and nutrition specialist (dietitian) to adjust your eating plan to your individual calorie needs. This information is not intended to replace advice given to you by your health care provider. Make sure you discuss any questions you have with your health care provider. Document Released: 01/07/2011 Document Revised: 01/12/2016 Document Reviewed: 01/12/2016 Elsevier Interactive Patient Education  [employer].

## 2017-07-07 NOTE — Progress Notes (Signed)
Patient was present today for blood pressure, Pulse and weight check due to lasix therapy. Reviewed with Dr. Bennye Alm he  would like to keep current therapy and also patient received education about salt restriction and keep his current follow up appointment and labs were also drawn today.

## 2017-07-08 ENCOUNTER — Telehealth: Payer: Self-pay

## 2017-07-08 ENCOUNTER — Telehealth: Payer: Self-pay | Admitting: Pulmonary Disease

## 2017-07-08 DIAGNOSIS — R0609 Other forms of dyspnea: Principal | ICD-10-CM

## 2017-07-08 DIAGNOSIS — I2510 Atherosclerotic heart disease of native coronary artery without angina pectoris: Secondary | ICD-10-CM

## 2017-07-08 LAB — BASIC METABOLIC PANEL
BUN/Creatinine Ratio: 11 (ref 10–24)
BUN: 23 mg/dL (ref 8–27)
CHLORIDE: 102 mmol/L (ref 96–106)
CO2: 29 mmol/L (ref 20–29)
CREATININE: 2.06 mg/dL — AB (ref 0.76–1.27)
Calcium: 8.8 mg/dL (ref 8.6–10.2)
GFR calc Af Amer: 37 mL/min/{1.73_m2} — ABNORMAL LOW (ref >59)
GFR calc non Af Amer: 32 mL/min/{1.73_m2} — ABNORMAL LOW (ref >59)
Glucose: 96 mg/dL (ref 65–99)
POTASSIUM: 3.3 mmol/L — AB (ref 3.5–5.2)
SODIUM: 146 mmol/L — AB (ref 134–144)

## 2017-07-08 NOTE — Telephone Encounter (Signed)
Left message to return call for lab results.

## 2017-07-08 NOTE — Telephone Encounter (Signed)
Dg Chest 2 View  Result Date: 06/30/2017 CLINICAL DATA:  Shortness of breath. EXAM: CHEST - 2 VIEW COMPARISON:  Radiograph of May 27, 2017. FINDINGS: Stable cardiomegaly. No pneumothorax is noted. Mild right basilar subsegmental atelectasis or scarring is noted. Small pleural effusion is noted. Left lung is clear. Bony thorax is unremarkable. IMPRESSION: Mild right basilar subsegmental atelectasis or scarring. Small right pleural effusion is noted. Electronically Signed   By: Marijo Conception, M.D.   On: 06/30/2017 13:56     Please let him know that CXR shows small fluid build up around right lung.  Likely related to heart failure.  Will need f/u CXR at next visit.

## 2017-07-08 NOTE — Telephone Encounter (Signed)
-----   Message from Jenean Lindau, MD sent at 07/08/2017  8:32 AM EDT ----- The results of the study is unremarkable. Please inform patient.  Let him increase his potassium to 40 mEq one day alternating with 20 mEq the next day.  Chem-7 in 1 week.  I will discuss in detail at next appointment. Cc  primary care/referring physician Jenean Lindau, MD 07/08/2017 8:32 AM

## 2017-07-11 MED ORDER — POTASSIUM CHLORIDE CRYS ER 20 MEQ PO TBCR: 80.0000 meq | EXTENDED_RELEASE_TABLET | Freq: Every day | ORAL | 3 refills | Status: DC

## 2017-07-11 NOTE — Telephone Encounter (Signed)
Patient states that his primary care physician told him about 2 weeks ago to start taking 2 tablets a day. Sometimes patient remembered to do that and sometimes he did not. Patient states that he will take potassium 2 tablets daily, and recheck his lab work in 1 week at Dr. Blanch Media office. Patient wanted to go with Dr. Blanch Media instruction first. Patient verbalized understanding of taking medication as prescribed. No further questions.

## 2017-07-11 NOTE — Addendum Note (Signed)
Addended by: Warner Mccreedy E on: 07/11/2017 04:27 PM   Modules accepted: Orders

## 2017-07-12 ENCOUNTER — Other Ambulatory Visit: Payer: Self-pay | Admitting: Pulmonary Disease

## 2017-07-12 ENCOUNTER — Other Ambulatory Visit: Payer: Self-pay

## 2017-07-12 DIAGNOSIS — J90 Pleural effusion, not elsewhere classified: Secondary | ICD-10-CM

## 2017-07-12 DIAGNOSIS — R0609 Other forms of dyspnea: Principal | ICD-10-CM

## 2017-07-12 NOTE — Progress Notes (Signed)
_0  ID: Douglas Villa, male    DOB: Jun 25, 1950, 67 y.o.   MRN: [id number]  Chief Complaint  Patient presents with  . Follow-up    ER 6/1 CHF. Cant sleep lying flat due to SOB everytime he lays flat. Feels worse on left side.     Referring provider: Lillard Villa  HPI: 67 year old male patient of Dr. Tamala Villa being followed for obstructive sleep apnea Past medical history- chronic kidney disease, heart failure with preserved ejection fraction, hypertension  Former smoker quit in February/2019.  50-pack-year history.  Recent Douglas Villa Pulmonary Encounters:   06/30/17-intial-Dr. Halford Villa Will arrange for chest xray and pulmonary function test Please get a copy of your sleep study and most recent CPAP report Follow up in 2 weeks with Dr. Halford Villa or Nurse Practitioner  Tests:  Imaging:   Cardiac:   Labs:   Micro:   Chart Review:   07/13/17  OV  Pleasant 67 year old patient seen in office today for follow-up visit.  Unfortunately patient got lost trying to find our office and was unable to complete his pulmonary function test which she was scheduled to do today.  Patient still to be seen as patient was recently in the emergency room on 07/02/2017.  The patient still reporting orthopnea, lower extremity swelling.  Patient admits he is adherent to his Lasix but needs improvement with low salt diet.  Patient is also not weighing himself daily.  Chart review showing extensive discussion with cardiologist at last week's appointment about lifestyle modifications needed to manage his congestive heart failure.  Patient reporting he will see his cardiologist next week and will also complete an echocardiogram at that time.   Allergies  Allergen Reactions  . Penicillins     Sweating     There is no immunization history on file for this patient.  Past Medical History:  Diagnosis Date  . Abnormal EKG 03/2009  . CAD (coronary artery disease)   . CKD (chronic kidney disease)    . Heart failure with preserved left ventricular function (HFpEF) ([place])   . Hypertension   . OSA (obstructive sleep apnea)   . Sexual dysfunction     Tobacco History: Social History   Tobacco Use  Smoking Status Former Smoker  . Packs/day: 1.00  . Years: 45.00  . Pack years: 45.00  . Types: Cigarettes  . Last attempt to quit: 03/31/2017  . Years since quitting: 0.2  Smokeless Tobacco Never Used   Counseling given: Yes Continue not smoking.  Reviewed smoking history with patient.  Patient knows that they smoked for about 50 years with 5 of those when he quit.  Edited tobacco history today.  Will refer patient to lung cancer screening program based off of his 45-pack-year history.  Outpatient Encounter Medications as of 07/13/2017  Medication Sig  . ACCU-CHEK FASTCLIX LANCETS MISC by Does not apply route.  Marland Kitchen aspirin 325 MG tablet Take 325 mg by mouth daily.    Marland Kitchen atorvastatin (LIPITOR) 40 MG tablet Take 1 tablet by mouth daily.  . Blood Glucose Monitoring Suppl (ACCU-CHEK AVIVA PLUS) w/Device KIT by Does not apply route.  . carvedilol (COREG) 12.5 MG tablet Take 1 tablet by mouth daily.  . furosemide (LASIX) 40 MG tablet Take 2 tablets by mouth 2 (two) times daily.   Marland Kitchen glucose blood test strip 1 each by Other route as needed. Use as instructed  . Multiple Vitamin (MULTIVITAMIN) capsule Take 1 capsule by mouth daily.    . nitroGLYCERIN (NITROSTAT) 0.4  MG SL tablet Place 0.4 mg under the tongue every 5 (five) minutes as needed for chest pain.  . potassium chloride SA (K-DUR,KLOR-CON) 20 MEQ tablet Take 4 tablets (80 mEq total) by mouth daily.   No facility-administered encounter medications on file as of 07/13/2017.      Review of Systems  Constitutional:  +fatigue  No  weight loss, night sweats,  fevers, chills HEENT:   No headaches,  Difficulty swallowing,  Tooth/dental problems, or  Sore throat, No sneezing, itching, ear ache, nasal congestion, post nasal drip  CV: +orthopnea,  LE swelling bilaterally No chest pain,  orthopnea, PND, swelling in lower extremities, anasarca, dizziness, palpitations, syncope  GI: No heartburn, indigestion, abdominal pain, nausea, vomiting, diarrhea, change in bowel habits, loss of appetite, bloody stools Resp: +sob when laying flat  No excess mucus, no productive cough,  No non-productive cough,  No coughing up of blood.  No change in color of mucus.  No wheezing.  No chest wall deformity Skin: no rash, lesions, no skin changes. GU: no dysuria, change in color of urine, no urgency or frequency.  No flank pain, no hematuria  MS:  No joint pain or swelling.  No decreased range of motion.  No back pain. Psych:  No change in mood or affect. No depression or anxiety.  No memory loss.   Physical Exam  BP 132/68   Pulse 64   Ht _0  (1.702 m)   Wt 189 lb 9.6 oz (86 kg)   SpO2 98%   BMI 29.70 kg/m    Wt Readings from Last 3 Encounters:  07/13/17 189 lb 9.6 oz (86 kg)  07/07/17 191 lb (86.6 kg)  07/04/17 190 lb 12.8 oz (86.5 kg)     GEN: A/Ox3; pleasant , NAD, well nourished   HEENT:  Douglas Villa/AT,  EACs-clear, TMs-wnl, NOSE-clear, THROAT-clear, no lesions, no postnasal drip or exudate noted.  NECK:  Supple w/ fair ROM; no JVD; normal carotid impulses w/o bruits;  no lymphadenopathy.   RESP:  Clear  P & A; w/o, wheezes/ rales/ or rhonchi. no accessory muscle use, no dullness to percussion CARD:  +LE bilateral swelling - pt wearing compression stockings, systolic murmur RRR, a, pulses intact, no cyanosis or clubbing. GI:   Soft & nt; nml bowel sounds; no organomegaly or masses detected.  Musco: Warm bil, no deformities or joint swelling noted. Ambulates freely in the room Neuro: alert, no focal deficits noted.   Skin: Warm, no lesions or rashes    Lab Results:  CBC No results found for: WBC, RBC, HGB, HCT, PLT, MCV, MCH, MCHC, RDW, LYMPHSABS, MONOABS, EOSABS, BASOSABS  BMET    Component Value Date/Time   NA 146 (H) 07/07/2017  1405   K 3.3 (L) 07/07/2017 1405   CL 102 07/07/2017 1405   CO2 29 07/07/2017 1405   GLUCOSE 96 07/07/2017 1405   BUN 23 07/07/2017 1405   CREATININE 2.06 (H) 07/07/2017 1405   CALCIUM 8.8 07/07/2017 1405   GFRNONAA 32 (L) 07/07/2017 1405   GFRAA 37 (L) 07/07/2017 1405    BNP No results found for: BNP  ProBNP No results found for: PROBNP  Imaging: Dg Chest 2 View  Result Date: 07/13/2017 CLINICAL DATA:  Pleural effusion, follow-up, dyspnea, recent stent placement, history CHF, hypertension, coronary artery disease, former smoker EXAM: CHEST - 2 VIEW COMPARISON:  07/02/2017 FINDINGS: Enlargement of cardiac silhouette. Calcified tortuous aorta. Mediastinal contours and pulmonary vascularity otherwise normal. Small RIGHT pleural effusion with atelectasis at  mid to lower RIGHT chest unchanged. Portion of the opacity at the lower RIGHT chest is somewhat nodular in appearance but unchanged. No acute infiltrate, LEFT pleural effusion or pneumothorax. Bones demineralized. IMPRESSION: Enlargement of cardiac silhouette with small RIGHT pleural effusion and chronic RIGHT basilar atelectasis. **Due to the somewhat nodular appearance of the opacity at the RIGHT lung base, recommend follow-up radiographs until resolution to exclude underlying pulmonary nodule.** Electronically Signed   By: Lavonia Dana M.D.   On: 07/13/2017 11:10   Dg Chest 2 View  Result Date: 06/30/2017 CLINICAL DATA:  Shortness of breath. EXAM: CHEST - 2 VIEW COMPARISON:  Radiograph of May 27, 2017. FINDINGS: Stable cardiomegaly. No pneumothorax is noted. Mild right basilar subsegmental atelectasis or scarring is noted. Small pleural effusion is noted. Left lung is clear. Bony thorax is unremarkable. IMPRESSION: Mild right basilar subsegmental atelectasis or scarring. Small right pleural effusion is noted. Electronically Signed   By: Marijo Conception, M.D.   On: 06/30/2017 13:56     Assessment & Plan:   67 year old patient seen  in office today.  Continued lower extremity edema being followed by cardiology.  Will see cardiology next week as well as get an echocardiogram performed.  Emphasized the importance of sticking to a low-sodium diet as well as daily weights, adherence to diuretic regimen, leg elevation, and compression stockings.  We will reschedule pulmonary function test with office visit.  Patient agrees.  Due to 45-pack-year smoking history we will also refer to low-dose CT/lung cancer screening program with Eric Form NP.  Patient slightly apprehensive with padding another appointment on.  I discussed with patient and said that we can place the referral today and we can focus on pulmonary function testing try to get this completed ideally in August.  Patient agreed.    Patient to follow-up with primary care and double check to see if they have had the pneumonia vaccines.  If he has not received patient can receive these in office at next visit.  Acute on chronic systolic heart failure (HCC) Continue Lasix  Follow up with Cardiology next week as scheduled on 07/20/17 >>>echocardiogram  >>>discuss nutritionist referral  >>> Follow-up with cardiology to discuss your orthopnea (trouble breathing when lying flat)  Start weighing yourself daily, do this in the morning try to go to the bathroom, the knob scale, before eating or drinking anything. >>> Record these results and track how your weight is doing >>> If you notice a 24h weight increase greater than 3 pounds, or weight increase greater than 5 pounds over 3 days notify your cardiologist  Chronic kidney disease, stage 3 ([place]) Discussed and reviewed lab results with patient and chart.  Showing chronic kidney disease stage IIIb based off of GFR.  Patient can decide and discuss with cardiology if they would like to start seeing a nephrologist for outpatient management of his chronic kidney disease.  Continue Lasix as prescribed.  Ex-smoker  We will refer  to Lung Cancer Screening Program due to you 45 pack year history  >>>With our Nurse Practitioner Eric Form NP   We will reschedule your pulmonary function testing >>> We will schedule for follow-up appointment after this      Lauraine Rinne, NP 07/13/2017

## 2017-07-13 ENCOUNTER — Encounter: Payer: Self-pay | Admitting: Pulmonary Disease

## 2017-07-13 ENCOUNTER — Ambulatory Visit (INDEPENDENT_AMBULATORY_CARE_PROVIDER_SITE_OTHER)
Admission: RE | Admit: 2017-07-13 | Discharge: 2017-07-13 | Disposition: A | Discharge status: Home / Self Care | Payer: Self-pay | Source: Ambulatory Visit | Attending: Pulmonary Disease | Admitting: Pulmonary Disease

## 2017-07-13 ENCOUNTER — Ambulatory Visit (INDEPENDENT_AMBULATORY_CARE_PROVIDER_SITE_OTHER): Payer: Self-pay | Admitting: Pulmonary Disease

## 2017-07-13 DIAGNOSIS — J90 Pleural effusion, not elsewhere classified: Secondary | ICD-10-CM

## 2017-07-13 DIAGNOSIS — Z87891 Personal history of nicotine dependence: Secondary | ICD-10-CM

## 2017-07-13 DIAGNOSIS — I5023 Acute on chronic systolic (congestive) heart failure: Secondary | ICD-10-CM

## 2017-07-13 DIAGNOSIS — N183 Chronic kidney disease, stage 3 unspecified: Secondary | ICD-10-CM

## 2017-07-13 NOTE — Assessment & Plan Note (Signed)
  We will refer to Lung Cancer Screening Program due to you 45 pack year history  >>>With our Nurse Practitioner Eric Form NP   We will reschedule your pulmonary function testing >>> We will schedule for follow-up appointment after this

## 2017-07-13 NOTE — Telephone Encounter (Signed)
Attempted to call pt. I did not receive an answer. I have left a message for pt to return our call.  

## 2017-07-13 NOTE — Assessment & Plan Note (Signed)
Discussed and reviewed lab results with patient and chart.  Showing chronic kidney disease stage IIIb based off of GFR.  Patient can decide and discuss with cardiology if they would like to start seeing a nephrologist for outpatient management of his chronic kidney disease.  Continue Lasix as prescribed.

## 2017-07-13 NOTE — Assessment & Plan Note (Signed)
Continue Lasix  Follow up with Cardiology next week as scheduled on 07/20/17 >>>echocardiogram  >>>discuss nutritionist referral  >>> Follow-up with cardiology to discuss your orthopnea (trouble breathing when lying flat)  Start weighing yourself daily, do this in the morning try to go to the bathroom, the knob scale, before eating or drinking anything. >>> Record these results and track how your weight is doing >>> If you notice a 24h weight increase greater than 3 pounds, or weight increase greater than 5 pounds over 3 days notify your cardiologist

## 2017-07-13 NOTE — Progress Notes (Signed)
Reviewed and agree with assessment/plan.   Aunica Dauphinee, MD [facility] 01/28/2016, 12:24 PM Pager:  [phone]  

## 2017-07-13 NOTE — Patient Instructions (Addendum)
Follow up with Cardiology next week as scheduled on 07/20/17 >>>echocardiogram  >>>discuss nutritionist referral  >>> Follow-up with cardiology to discuss your orthopnea (trouble breathing when lying flat)  We will refer to Lung Cancer Screening Program due to you 45 pack year history  >>>With our Nurse Practitioner Eric Form NP   We will reschedule your pulmonary function testing >>> We will schedule for follow-up appointment after this    Start weighing yourself daily, do this in the morning try to go to the bathroom, the knob scale, before eating or drinking anything. >>> Record these results and track how your weight is doing >>> If you notice a 24h weight increase greater than 3 pounds, or weight increase greater than 5 pounds over 3 days notify your cardiologist     Please contact the office if your symptoms worsen or you have concerns that you are not improving.   Thank you for choosing [facility] for your healthcare, and for allowing [place] to partner with you on your healthcare journey. I am thankful to be able to provide care to you today.   Wyn Quaker FNP-C    DASH Eating Plan DASH stands for "Dietary Approaches to Stop Hypertension." The DASH eating plan is a healthy eating plan that has been shown to reduce high blood pressure (hypertension). It may also reduce your risk for type 2 diabetes, heart disease, and stroke. The DASH eating plan may also help with weight loss. What are tips for following this plan? General guidelines  Avoid eating more than 2,300 mg (milligrams) of salt (sodium) a day. If you have hypertension, you may need to reduce your sodium intake to 1,500 mg a day.  Limit alcohol intake to no more than 1 drink a day for nonpregnant women and 2 drinks a day for men. One drink equals 12 oz of beer, 5 oz of wine, or 1 oz of hard liquor.  Work with your health care provider to maintain a healthy body weight or to lose weight. Ask what an ideal  weight is for you.  Get at least 30 minutes of exercise that causes your heart to beat faster (aerobic exercise) most days of the week. Activities may include walking, swimming, or biking.  Work with your health care provider or diet and nutrition specialist (dietitian) to adjust your eating plan to your individual calorie needs. Reading food labels  Check food labels for the amount of sodium per serving. Choose foods with less than 5 percent of the Daily Value of sodium. Generally, foods with less than 300 mg of sodium per serving fit into this eating plan.  To find whole grains, look for the word "whole" as the first word in the ingredient list. Shopping  Buy products labeled as "low-sodium" or "no salt added."  Buy fresh foods. Avoid canned foods and premade or frozen meals. Cooking  Avoid adding salt when cooking. Use salt-free seasonings or herbs instead of table salt or sea salt. Check with your health care provider or pharmacist before using salt substitutes.  Do not fry foods. Cook foods using healthy methods such as baking, boiling, grilling, and broiling instead.  Cook with heart-healthy oils, such as olive, canola, soybean, or sunflower oil. Meal planning   Eat a balanced diet that includes: ? 5 or more servings of fruits and vegetables each day. At each meal, try to fill half of your plate with fruits and vegetables. ? Up to 6-8 servings of whole grains each day. ?  Less than 6 oz of lean meat, poultry, or fish each day. A 3-oz serving of meat is about the same size as a deck of cards. One egg equals 1 oz. ? 2 servings of low-fat dairy each day. ? A serving of nuts, seeds, or beans 5 times each week. ? Heart-healthy fats. Healthy fats called Omega-3 fatty acids are found in foods such as flaxseeds and coldwater fish, like sardines, salmon, and mackerel.  Limit how much you eat of the following: ? Canned or prepackaged foods. ? Food that is high in trans fat, such as  fried foods. ? Food that is high in saturated fat, such as fatty meat. ? Sweets, desserts, sugary drinks, and other foods with added sugar. ? Full-fat dairy products.  Do not salt foods before eating.  Try to eat at least 2 vegetarian meals each week.  Eat more home-cooked food and less restaurant, buffet, and fast food.  When eating at a restaurant, ask that your food be prepared with less salt or no salt, if possible. What foods are recommended? The items listed may not be a complete list. Talk with your dietitian about what dietary choices are best for you. Grains Whole-grain or whole-wheat bread. Whole-grain or whole-wheat pasta. Brown rice. [place]. Bulgur. Whole-grain and low-sodium cereals. Pita bread. Low-fat, low-sodium crackers. Whole-wheat flour tortillas. Vegetables Fresh or frozen vegetables (raw, steamed, roasted, or grilled). Low-sodium or reduced-sodium tomato and vegetable juice. Low-sodium or reduced-sodium tomato sauce and tomato paste. Low-sodium or reduced-sodium canned vegetables. Fruits All fresh, dried, or frozen fruit. Canned fruit in natural juice (without added sugar). Meat and other protein foods Skinless chicken or [place]. Ground chicken or [place]. Pork with fat trimmed off. Fish and seafood. Egg whites. Dried beans, peas, or lentils. Unsalted nuts, nut butters, and seeds. Unsalted canned beans. Lean cuts of beef with fat trimmed off. Low-sodium, lean deli meat. Dairy Low-fat (1%) or fat-free (skim) milk. Fat-free, low-fat, or reduced-fat cheeses. Nonfat, low-sodium ricotta or cottage cheese. Low-fat or nonfat yogurt. Low-fat, low-sodium cheese. Fats and oils Soft margarine without trans fats. Vegetable oil. Low-fat, reduced-fat, or light mayonnaise and salad dressings (reduced-sodium). Canola, safflower, olive, soybean, and sunflower oils. Avocado. Seasoning and other foods Herbs. Spices. Seasoning mixes without salt. Unsalted popcorn and pretzels.  Fat-free sweets. What foods are not recommended? The items listed may not be a complete list. Talk with your dietitian about what dietary choices are best for you. Grains Baked goods made with fat, such as croissants, muffins, or some breads. Dry pasta or rice meal packs. Vegetables Creamed or fried vegetables. Vegetables in a cheese sauce. Regular canned vegetables (not low-sodium or reduced-sodium). Regular canned tomato sauce and paste (not low-sodium or reduced-sodium). Regular tomato and vegetable juice (not low-sodium or reduced-sodium). Angie Fava. Olives. Fruits Canned fruit in a light or heavy syrup. Fried fruit. Fruit in cream or butter sauce. Meat and other protein foods Fatty cuts of meat. Ribs. Fried meat. Berniece Salines. Sausage. Bologna and other processed lunch meats. Salami. Fatback. Hotdogs. Bratwurst. Salted nuts and seeds. Canned beans with added salt. Canned or smoked fish. Whole eggs or egg yolks. Chicken or [place] with skin. Dairy Whole or 2% milk, cream, and half-and-half. Whole or full-fat cream cheese. Whole-fat or sweetened yogurt. Full-fat cheese. Nondairy creamers. Whipped toppings. Processed cheese and cheese spreads. Fats and oils Butter. Stick margarine. Lard. Shortening. Ghee. Bacon fat. Tropical oils, such as coconut, palm kernel, or palm oil. Seasoning and other foods Salted popcorn and pretzels. Onion salt,  garlic salt, seasoned salt, table salt, and sea salt. Worcestershire sauce. Tartar sauce. Barbecue sauce. Teriyaki sauce. Soy sauce, including reduced-sodium. Steak sauce. Canned and packaged gravies. Fish sauce. Oyster sauce. Cocktail sauce. Horseradish that you find on the shelf. Ketchup. Mustard. Meat flavorings and tenderizers. Bouillon cubes. Hot sauce and Tabasco sauce. Premade or packaged marinades. Premade or packaged taco seasonings. Relishes. Regular salad dressings. Where to find more information:  National Heart, Lung, and [place]:  https://wilson-eaton.com/  American Heart Association: www.heart.org Summary  The DASH eating plan is a healthy eating plan that has been shown to reduce high blood pressure (hypertension). It may also reduce your risk for type 2 diabetes, heart disease, and stroke.  With the DASH eating plan, you should limit salt (sodium) intake to 2,300 mg a day. If you have hypertension, you may need to reduce your sodium intake to 1,500 mg a day.  When on the DASH eating plan, aim to eat more fresh fruits and vegetables, whole grains, lean proteins, low-fat dairy, and heart-healthy fats.  Work with your health care provider or diet and nutrition specialist (dietitian) to adjust your eating plan to your individual calorie needs. This information is not intended to replace advice given to you by your health care provider. Make sure you discuss any questions you have with your health care provider. Document Released: 01/07/2011 Document Revised: 01/12/2016 Document Reviewed: 01/12/2016 Elsevier Interactive Patient Education  [employer].

## 2017-07-14 NOTE — Addendum Note (Signed)
Addended by: Lauraine Rinne on: 07/14/2017 01:41 PM   Modules accepted: Orders

## 2017-07-14 NOTE — Progress Notes (Signed)
Chest x-ray from yesterday still showing a right pleural effusion.  There is a slight portion of the right chest that shows a somewhat nodular appearance.  Radiology is requesting that we repeat this chest x-ray.  We will repeat it at your 08/01/2017 appointment.  You can present to the basement to complete this chest x-ray prior to your pulmonary function test and our appointment.   Wyn Quaker FNP

## 2017-07-14 NOTE — Telephone Encounter (Signed)
Called and spoke with patient regarding results.  Informed the patient of results and recommendations today. Placed order for CXR; explained to pt to have CXR prior to [place] on 08/01/2017. Placed note on appt note to remind front desk pt needs CXR prior to OV same day.  Pt verbalized understanding and denied any questions or concerns at this time.  Nothing further needed.

## 2017-07-21 ENCOUNTER — Ambulatory Visit (HOSPITAL_BASED_OUTPATIENT_CLINIC_OR_DEPARTMENT_OTHER)
Admission: RE | Admit: 2017-07-21 | Discharge: 2017-07-21 | Disposition: A | Discharge status: Home / Self Care | Payer: Self-pay | Source: Ambulatory Visit | Attending: Legal Medicine | Admitting: Legal Medicine

## 2017-07-21 DIAGNOSIS — Z87891 Personal history of nicotine dependence: Secondary | ICD-10-CM | POA: Insufficient documentation

## 2017-07-21 DIAGNOSIS — I2510 Atherosclerotic heart disease of native coronary artery without angina pectoris: Secondary | ICD-10-CM | POA: Insufficient documentation

## 2017-07-21 DIAGNOSIS — I5023 Acute on chronic systolic (congestive) heart failure: Secondary | ICD-10-CM | POA: Insufficient documentation

## 2017-07-21 DIAGNOSIS — I130 Hypertensive heart and chronic kidney disease with heart failure and stage 1 through stage 4 chronic kidney disease, or unspecified chronic kidney disease: Secondary | ICD-10-CM | POA: Insufficient documentation

## 2017-07-21 DIAGNOSIS — I272 Pulmonary hypertension, unspecified: Secondary | ICD-10-CM | POA: Insufficient documentation

## 2017-07-21 DIAGNOSIS — I081 Rheumatic disorders of both mitral and tricuspid valves: Secondary | ICD-10-CM | POA: Insufficient documentation

## 2017-07-21 DIAGNOSIS — E785 Hyperlipidemia, unspecified: Secondary | ICD-10-CM | POA: Insufficient documentation

## 2017-07-21 NOTE — Progress Notes (Signed)
  Echocardiogram 2D Echocardiogram has been performed.  Douglas Villa 07/21/2017, 1:45 PM

## 2017-07-22 ENCOUNTER — Other Ambulatory Visit: Payer: Self-pay

## 2017-07-22 DIAGNOSIS — I5023 Acute on chronic systolic (congestive) heart failure: Secondary | ICD-10-CM

## 2017-08-01 ENCOUNTER — Ambulatory Visit: Payer: Self-pay | Admitting: Pulmonary Disease

## 2017-08-03 NOTE — Progress Notes (Signed)
$'@Patient'Y$  ID: Douglas Villa, male    DOB: 1950-05-25, 67 y.o.   MRN: [id number]  Chief Complaint  Patient presents with  . Follow-up    PFT today, difficulty breathing when laying down, SOB with exertion and stress    Referring provider: Lillard Villa  HPI:  67 year old male patient of Douglas Villa being followed for obstructive sleep apnea Past medical history- chronic kidney disease, heart failure with preserved ejection fraction, hypertension  Former smoker quit in February/2019.  50-pack-year history.  Recent [facility] Encounters:   06/30/17-intial-Douglas Villa Will arrange for chest xray and pulmonary function test Please get a copy of your sleep study and most recent CPAP report Follow up in 2 weeks with Douglas Villa or Nurse Practitioner  07/13/17  OV Pleasant 67 year old patient seen in office today for follow-up visit.  Unfortunately patient got lost trying to find our office and was unable to complete his pulmonary function test which she was scheduled to do today.  Patient still to be seen as patient was recently in the emergency room on 07/02/2017. The patient still reporting orthopnea, lower extremity swelling.  Patient admits he is adherent to his Lasix but needs improvement with low salt diet.  Patient is also not weighing himself daily.  Chart review showing extensive discussion with cardiologist at last week's appointment about lifestyle modifications needed to manage his congestive heart failure. Patient reporting he will see his cardiologist next week and will also complete an echocardiogram at that time. Plan: Follow-up to complete pulmonary function test that was missed today, continue close follow-up with cardiology and follow low-sodium diet, referral to lung cancer screening program, discussed kidney functioning with cardiology, follow-up with primary care regarding pneumonia vaccines   Tests:   Imaging:  07/13/2017-chest x-ray- enlargement of  cardiac silhouette with small right pleural effusion chronic right basilar atelectasis, due to somewhat nodular appearance of the opacity at the right lung base recommend follow-up radiographs until resolution to exclude underlying pulmonary nodule  Cardiac:  07/21/2017-echocardiogram-LV ejection fraction 25 to 86%, systolic function was severely reduced, grade 3 diastolic dysfunction moderate pulmonary hypertension, PA peak pressure 57  Labs:   Micro:   Chart Review:        08/05/17 OV - With PFT  Patient presenting today for office visit after completing pulmonary function test.  Patient reporting continued to have issues with his breathing and feeling increasingly short of breath.  Patient is also brought his CPAP to our office for compliance download.    Pulmonary function test today showing moderate obstruction, bronchodilator response, small airway disease with reversibility, and severely diminished DLCO of 17.  Concavity and flow volume loops.  Patient is not currently on any controller therapies at this time.  Patient does not have a rescue inhaler at this time.  CPAP compliance report showing 19 of the last 30 days with CPAP use.  For an average of 63%.  Average usage on days used is 4 hours and 35 minutes.  Only 33% of the time is patient getting greater than 4 hours to use based off of report.  AHI is 7.6.   Patient walked in office today.  Oxygen saturations remained above 98%.  On room air.  Patient reporting he is continuing to not smoke.  Unfortunately patient has friends and family who still smoke.     Allergies  Allergen Reactions  . Penicillins     Sweating      There is no immunization history on file  for this patient. Patient has not had pneumonia vaccine before.  Patient does not have insurance at this time.  Unsure of cost to patient.  Will research.  Patient may need to go to health department to obtain this vaccine at a more reasonable cost.   Past  Medical History:  Diagnosis Date  . Abnormal EKG 03/2009  . CAD (coronary artery disease)   . CKD (chronic kidney disease)   . Heart failure with preserved left ventricular function (HFpEF) ([place])   . Hypertension   . OSA (obstructive sleep apnea)   . Sexual dysfunction     Tobacco History: Social History   Tobacco Use  Smoking Status Former Smoker  . Packs/day: 1.00  . Years: 45.00  . Pack years: 45.00  . Types: Cigarettes  . Last attempt to quit: 03/31/2017  . Years since quitting: 0.3  Smokeless Tobacco Never Used   Counseling given: Yes Patient continues to not smoke.  Patient's niece does smoke.  Discussed at length with patient and patient's niece to limit tobacco exposure, not to smoke in the house, not to smoke in the car, continue to smoke smoke outside.  Encourage patient's niece to also stop smoking.  Outpatient Encounter Medications as of 08/05/2017  Medication Sig  . ACCU-CHEK FASTCLIX LANCETS MISC by Does not apply route.  Marland Kitchen aspirin 325 MG tablet Take 325 mg by mouth daily.    Marland Kitchen atorvastatin (LIPITOR) 40 MG tablet Take 1 tablet by mouth daily.  . Blood Glucose Monitoring Suppl (ACCU-CHEK AVIVA PLUS) w/Device KIT by Does not apply route.  . carvedilol (COREG) 12.5 MG tablet Take 1 tablet by mouth daily.  . furosemide (LASIX) 40 MG tablet Take 2 tablets by mouth 2 (two) times daily.   Marland Kitchen glucose blood test strip 1 each by Other route as needed. Use as instructed  . Multiple Vitamin (MULTIVITAMIN) capsule Take 1 capsule by mouth daily.    . nitroGLYCERIN (NITROSTAT) 0.4 MG SL tablet Place 0.4 mg under the tongue every 5 (five) minutes as needed for chest pain.  . potassium chloride SA (K-DUR,KLOR-CON) 20 MEQ tablet Take 4 tablets (80 mEq total) by mouth daily.  Marland Kitchen albuterol (PROVENTIL HFA;VENTOLIN HFA) 108 (90 Base) MCG/ACT inhaler Inhale 2 puffs into the lungs every 6 (six) hours as needed for wheezing or shortness of breath.  . umeclidinium-vilanterol (ANORO ELLIPTA)  62.5-25 MCG/INH AEPB Inhale 1 puff into the lungs daily.   No facility-administered encounter medications on file as of 08/05/2017.      Review of Systems  Review of Systems  Constitutional: Positive for fatigue. Negative for fever and unexpected weight change.  HENT: Negative for congestion, sinus pressure, sinus pain, sneezing and sore throat.   Respiratory: Positive for cough and shortness of breath. Negative for choking, chest tightness and wheezing.   Cardiovascular: Positive for leg swelling (chronic, improved since last exam). Negative for chest pain and palpitations.  Gastrointestinal: Negative for abdominal pain, diarrhea, nausea and vomiting.  Genitourinary: Negative for difficulty urinating and hematuria.  Musculoskeletal: Negative for back pain, gait problem and joint swelling.  Skin: Negative for pallor.  Allergic/Immunologic: Positive for environmental allergies.  Neurological: Negative for dizziness, light-headedness and headaches.  Psychiatric/Behavioral: Negative for dysphoric mood. The patient is not nervous/anxious.        Physical Exam  BP 128/84 (BP Location: Right Arm, Cuff Size: Normal)   Pulse 70   Ht 5' 4.96" (1.65 m)   Wt 190 lb 6.4 oz (86.4 kg)   SpO2  100%   BMI 31.72 kg/m   Wt Readings from Last 5 Encounters:  08/05/17 190 lb 6.4 oz (86.4 kg)  07/13/17 189 lb 9.6 oz (86 kg)  07/07/17 191 lb (86.6 kg)  07/04/17 190 lb 12.8 oz (86.5 kg)  06/30/17 184 lb (83.5 kg)     Physical Exam  Constitutional: He is oriented to person, place, and time and well-developed, well-nourished, and in no distress. No distress.  HENT:  Head: Normocephalic.  Right Ear: External ear normal.  Left Ear: External ear normal.  Nose: Nose normal.  Mouth/Throat: Oropharynx is clear and moist. No oropharyngeal exudate.  Eyes: EOM are normal. Right eye exhibits no discharge. Left eye exhibits no discharge.  Neck: Normal range of motion. Neck supple. No JVD present.    Cardiovascular: Normal rate, regular rhythm and normal heart sounds.  Pulmonary/Chest: Effort normal. No accessory muscle usage or stridor. No respiratory distress. He has no decreased breath sounds. He has no wheezes.  Abdominal: Soft. Bowel sounds are normal. He exhibits no distension. There is no tenderness.  Musculoskeletal: Normal range of motion. He exhibits edema (LE bilaterally, improved from prev exams - 1+ today ).  Lymphadenopathy:    He has no cervical adenopathy.  Neurological: He is alert and oriented to person, place, and time. Gait normal.  Skin: Skin is warm and dry. He is not diaphoretic.  Psychiatric: Mood, memory, affect and judgment normal.  Nursing note and vitals reviewed.       Lab Results:  CBC No results found for: WBC, RBC, HGB, HCT, PLT, MCV, MCH, MCHC, RDW, LYMPHSABS, MONOABS, EOSABS, BASOSABS  BMET    Component Value Date/Time   NA 146 (H) 07/07/2017 1405   K 3.3 (L) 07/07/2017 1405   CL 102 07/07/2017 1405   CO2 29 07/07/2017 1405   GLUCOSE 96 07/07/2017 1405   BUN 23 07/07/2017 1405   CREATININE 2.06 (H) 07/07/2017 1405   CALCIUM 8.8 07/07/2017 1405   GFRNONAA 32 (L) 07/07/2017 1405   GFRAA 37 (L) 07/07/2017 1405    BNP No results found for: BNP  ProBNP No results found for: PROBNP  Imaging: Dg Chest 2 View  Result Date: 08/05/2017 CLINICAL DATA:  Shortness of breath for 6 months. EXAM: CHEST - 2 VIEW COMPARISON:  07/13/2017, 07/02/2017, 06/30/2017 and 05/27/2017 FINDINGS: Chronic cardiomegaly. Pulmonary vascularity is normal. There are multiple old right lateral rib fractures with adjacent pleural thickening and chronic blunting of the right costophrenic angle. The nodular appearing density at the right lung base is less prominent I suspect this represents loculated pleural fluid. Left lung is clear. Small pericardial fat pad at the left ventricular apex. IMPRESSION: No significant change in the appearance of the chest. Multiple old right  lateral rib fractures with adjacent pleural thickening. Chronic cardiomegaly. Electronically Signed   By: Lorriane Shire M.D.   On: 08/05/2017 10:59   Dg Chest 2 View  Result Date: 07/13/2017 CLINICAL DATA:  Pleural effusion, follow-up, dyspnea, recent stent placement, history CHF, hypertension, coronary artery disease, former smoker EXAM: CHEST - 2 VIEW COMPARISON:  07/02/2017 FINDINGS: Enlargement of cardiac silhouette. Calcified tortuous aorta. Mediastinal contours and pulmonary vascularity otherwise normal. Small RIGHT pleural effusion with atelectasis at mid to lower RIGHT chest unchanged. Portion of the opacity at the lower RIGHT chest is somewhat nodular in appearance but unchanged. No acute infiltrate, LEFT pleural effusion or pneumothorax. Bones demineralized. IMPRESSION: Enlargement of cardiac silhouette with small RIGHT pleural effusion and chronic RIGHT basilar atelectasis. **Due to  the somewhat nodular appearance of the opacity at the RIGHT lung base, recommend follow-up radiographs until resolution to exclude underlying pulmonary nodule.** Electronically Signed   By: Lavonia Dana M.D.   On: 07/13/2017 11:10     Assessment & Plan:   Pleasant 67 year old patient seen office today.  Based off pulmonary function test results we will give patient therapeutic trial of Anoro Ellipta inhaler.  Patient to call when completed with 1 pack of the Anoro Ellipta to let us know how he is doing.  Then we can place an order for more if this is going well.   With bronchodilator response we will also ensure patient has rescue inhaler to use.  Patient does not have insurance so printed good Rx coupon for patient to use.  Patient to follow-up with our office if there is any issues obtaining this medication.  Emphasized the importance of patient following up with cardiology, they report that his next appointment is later on this month.  Patient agrees to make sure they follow his kidney  functioning.  Follow-up in our office in 6 weeks.  Can further address CPAP use at that time.  Requested previous home sleep study results.  Patient does need to obtain pneumonia vaccines.  Due to obstructive lung disease as well as congestive heart failure with reduced EF.  Unfortunately patient does not have any insurance.  Will follow-up with our billing and patient care coordinators in our office to discover which is the best option for him.  I suspect he may need to go to the health department in order to obtain these vaccines at the most cost effective price.  Obstructive sleep apnea Continue CPAP use   . Keep up the hard work using your device.  . Do not drive or operate heavy machinery if tired or drowsy.  . Please notify the supply company and office if you are unable to use your device regularly due to missing supplies or machine being broken.  . Work on maintaining a healthy weight and following your recommended nutrition plan  . Maintain proper daily exercise and movement  . Maintaining proper use of your device can also help improve management of other chronic illnesses such as: Blood pressure, blood sugars, and weight management.    Will request home sleep study results.  Not on file in our system.  Hard to address AHI without knowing what initial AHI was.  Follow-up with our office in 6 weeks  COPD (chronic obstructive pulmonary disease) (HCC)   Anoro Ellipta Inhaler today  >>>1 puff daily no matter what when you wake up   Continue follow up to Cardiology  >>> repeat kidney lab work   We will place a prescription for rescue inhaler >>> Coupon printed today >>> Prescription printed >>> Use this as needed every 6 hours for shortness of breath and wheezing  Follow-up with our office in 6 weeks  Ex-smoker Continue not smoking  Avoid being around others who smoke, exam smoke outside your house, do not allow them to smoke in your car  With your lung and cardiac  history.  As well as being a former smoker.  I do highly recommend that we get you vaccinated with the pneumonia vaccines.  We will look to see which would be the most affordable option for you.  I suspect since you are without insurance that it would be the health department.  We will follow-up with you that way you can obtain these vaccines.  Acute on  chronic systolic heart failure (HCC) Discussed ejection fraction with patient today.  Encourage patient to keep follow-up appointment with cardiology.  Continue to avoid high sodium meals, continue to follow low-sodium diet Continue daily weights     Lauraine Rinne, NP 08/05/2017

## 2017-08-05 ENCOUNTER — Ambulatory Visit (INDEPENDENT_AMBULATORY_CARE_PROVIDER_SITE_OTHER): Payer: Self-pay | Admitting: Pulmonary Disease

## 2017-08-05 ENCOUNTER — Ambulatory Visit (INDEPENDENT_AMBULATORY_CARE_PROVIDER_SITE_OTHER)
Admission: RE | Admit: 2017-08-05 | Discharge: 2017-08-05 | Disposition: A | Discharge status: Home / Self Care | Payer: Self-pay | Source: Ambulatory Visit | Attending: Pulmonary Disease | Admitting: Pulmonary Disease

## 2017-08-05 ENCOUNTER — Encounter: Payer: Self-pay | Admitting: Pulmonary Disease

## 2017-08-05 ENCOUNTER — Telehealth: Payer: Self-pay | Admitting: Pulmonary Disease

## 2017-08-05 DIAGNOSIS — R0609 Other forms of dyspnea: Secondary | ICD-10-CM

## 2017-08-05 DIAGNOSIS — G4733 Obstructive sleep apnea (adult) (pediatric): Secondary | ICD-10-CM

## 2017-08-05 DIAGNOSIS — J449 Chronic obstructive pulmonary disease, unspecified: Secondary | ICD-10-CM

## 2017-08-05 DIAGNOSIS — Z87891 Personal history of nicotine dependence: Secondary | ICD-10-CM

## 2017-08-05 DIAGNOSIS — I5023 Acute on chronic systolic (congestive) heart failure: Secondary | ICD-10-CM

## 2017-08-05 LAB — PULMONARY FUNCTION TEST
DL/VA % pred: 96 %
DL/VA: 4.08 ml/min/mmHg/L
DLCO unc % pred: 17 %
DLCO unc: 4.58 ml/min/mmHg
FEF 25-75 Post: 1.25 L/sec
FEF 25-75 Pre: 0.77 L/sec
FEF2575-%CHANGE-POST: 61 %
FEF2575-%PRED-PRE: 37 %
FEF2575-%Pred-Post: 60 %
FEV1-%CHANGE-POST: 11 %
FEV1-%PRED-POST: 61 %
FEV1-%PRED-PRE: 55 %
FEV1-PRE: 1.29 L
FEV1-Post: 1.44 L
FEV1FVC-%CHANGE-POST: -2 %
FEV1FVC-%Pred-Pre: 91 %
FEV6-%Change-Post: 15 %
FEV6-%PRED-PRE: 61 %
FEV6-%Pred-Post: 71 %
FEV6-Post: 2.1 L
FEV6-Pre: 1.83 L
FEV6FVC-%Change-Post: 0 %
FEV6FVC-%Pred-Post: 104 %
FEV6FVC-%Pred-Pre: 104 %
FVC-%Change-Post: 15 %
FVC-%PRED-PRE: 58 %
FVC-%Pred-Post: 67 %
FVC-POST: 2.11 L
FVC-PRE: 1.83 L
POST FEV6/FVC RATIO: 99 %
PRE FEV6/FVC RATIO: 100 %
Post FEV1/FVC ratio: 68 %
Pre FEV1/FVC ratio: 70 %
RV % pred: 256 %
RV: 5.54 L
TLC % pred: 123 %
TLC: 7.43 L

## 2017-08-05 MED ORDER — ALBUTEROL SULFATE HFA 108 (90 BASE) MCG/ACT IN AERS: 2.0000 | INHALATION_SPRAY | Freq: Four times a day (QID) | RESPIRATORY_TRACT | 2 refills | Status: AC | PRN

## 2017-08-05 MED ORDER — UMECLIDINIUM-VILANTEROL 62.5-25 MCG/INH IN AEPB: 1.0000 | INHALATION_SPRAY | Freq: Every day | RESPIRATORY_TRACT | 0 refills | Status: DC

## 2017-08-05 NOTE — Assessment & Plan Note (Signed)
   Anoro Ellipta Inhaler today  >>>1 puff daily no matter what when you wake up   Continue follow up to Cardiology  >>> repeat kidney lab work   We will place a prescription for rescue inhaler >>> Coupon printed today >>> Prescription printed >>> Use this as needed every 6 hours for shortness of breath and wheezing  Follow-up with our office in 6 weeks

## 2017-08-05 NOTE — Assessment & Plan Note (Signed)
Continue not smoking  Avoid being around others who smoke, exam smoke outside your house, do not allow them to smoke in your car  With your lung and cardiac history.  As well as being a former smoker.  I do highly recommend that we get you vaccinated with the pneumonia vaccines.  We will look to see which would be the most affordable option for you.  I suspect since you are without insurance that it would be the health department.  We will follow-up with you that way you can obtain these vaccines.

## 2017-08-05 NOTE — Assessment & Plan Note (Signed)
Continue CPAP use   . Keep up the hard work using your device.  . Do not drive or operate heavy machinery if tired or drowsy.  . Please notify the supply company and office if you are unable to use your device regularly due to missing supplies or machine being broken.  . Work on maintaining a healthy weight and following your recommended nutrition plan  . Maintain proper daily exercise and movement  . Maintaining proper use of your device can also help improve management of other chronic illnesses such as: Blood pressure, blood sugars, and weight management.    Will request home sleep study results.  Not on file in our system.  Hard to address AHI without knowing what initial AHI was.  Follow-up with our office in 6 weeks

## 2017-08-05 NOTE — Progress Notes (Signed)
PFT completed today. 08/05/17  

## 2017-08-05 NOTE — Telephone Encounter (Signed)
Noted.  We will route to Brittney so she is aware as well.  Wyn Quaker FNP

## 2017-08-05 NOTE — Telephone Encounter (Signed)
Attempted to contact patient to make him aware that he will need to have a sleep study, a family member stated he was not home and to try back later.

## 2017-08-05 NOTE — Progress Notes (Signed)
Reviewed and agree with assessment/plan.   Sabrina Keough, MD [facility] 01/28/2016, 12:24 PM Pager:  [phone]  

## 2017-08-05 NOTE — Assessment & Plan Note (Signed)
Discussed ejection fraction with patient today.  Encourage patient to keep follow-up appointment with cardiology.  Continue to avoid high sodium meals, continue to follow low-sodium diet Continue daily weights

## 2017-08-05 NOTE — Patient Instructions (Addendum)
Continue CPAP use   . Keep up the hard work using your device.  . Do not drive or operate heavy machinery if tired or drowsy.  . Please notify the supply company and office if you are unable to use your device regularly due to missing supplies or machine being broken.  . Work on maintaining a healthy weight and following your recommended nutrition plan  . Maintain proper daily exercise and movement  . Maintaining proper use of your device can also help improve management of other chronic illnesses such as: Blood pressure, blood sugars, and weight management.     Anoro Ellipta Inhaler today  >>>1 puff daily no matter what when you wake up   Continue follow up to Cardiology  >>> repeat kidney lab work   We will place a prescription for rescue inhaler >>> Coupon printed today >>> Prescription printed >>> Use this as needed every 6 hours for shortness of breath and wheezing  Follow-up with our office in 6 weeks  Please contact the office if your symptoms worsen or you have concerns that you are not improving.   Thank you for choosing [facility] for your healthcare, and for allowing [place] to partner with you on your healthcare journey. I am thankful to be able to provide care to you today.   Wyn Quaker FNP-C

## 2017-08-05 NOTE — Telephone Encounter (Signed)
[facility] and spoke with Mel Almond who stated a different company was at the same location as [facility] before they were at their current location and pt might have had a sleep study done at a different location.  Per Mel Almond, pt had a cpap compliance report done but did not have a sleep study done at [facility].  Routing to [employer] as an [employer]

## 2017-08-08 ENCOUNTER — Ambulatory Visit: Payer: Self-pay | Admitting: Cardiology

## 2017-08-09 ENCOUNTER — Telehealth: Payer: Self-pay | Admitting: Pulmonary Disease

## 2017-08-09 NOTE — Telephone Encounter (Signed)
Called patient and made aware that when requesting records for his sleep study from [facility], we were informed the company was no longer there and had resolved and that he would need to do another sleep study. Patient expressed that he had concerns regarding not having insurance and he would like to call and see if he would be able to get more information and then he would give [place] a call back. Patient was informed there are patient assistance programs for Cpap machine and supplies. Patient voices that he will contact office with more information or how he will proceed forward.   Douglas Villa P: [phone] F; [phone]

## 2017-08-17 ENCOUNTER — Telehealth: Payer: Self-pay | Admitting: Pulmonary Disease

## 2017-08-17 MED ORDER — UMECLIDINIUM-VILANTEROL 62.5-25 MCG/INH IN AEPB: 1.0000 | INHALATION_SPRAY | Freq: Every day | RESPIRATORY_TRACT | 5 refills | Status: DC

## 2017-08-17 NOTE — Telephone Encounter (Signed)
Spoke with pt. He states that Anoro is working well. Pt would like to have a prescription sent in. Rx has been sent in. Nothing further was needed.

## 2017-08-19 ENCOUNTER — Encounter: Payer: Self-pay | Admitting: Cardiology

## 2017-08-19 ENCOUNTER — Ambulatory Visit (INDEPENDENT_AMBULATORY_CARE_PROVIDER_SITE_OTHER): Payer: Self-pay | Admitting: Cardiology

## 2017-08-19 VITALS — BP 120/80 | HR 71 | Ht 64.96 in | Wt 191.0 lb

## 2017-08-19 DIAGNOSIS — I10 Essential (primary) hypertension: Secondary | ICD-10-CM

## 2017-08-19 DIAGNOSIS — N183 Chronic kidney disease, stage 3 unspecified: Secondary | ICD-10-CM

## 2017-08-19 DIAGNOSIS — G4733 Obstructive sleep apnea (adult) (pediatric): Secondary | ICD-10-CM

## 2017-08-19 DIAGNOSIS — I429 Cardiomyopathy, unspecified: Secondary | ICD-10-CM

## 2017-08-19 DIAGNOSIS — I2510 Atherosclerotic heart disease of native coronary artery without angina pectoris: Secondary | ICD-10-CM

## 2017-08-19 DIAGNOSIS — E782 Mixed hyperlipidemia: Secondary | ICD-10-CM

## 2017-08-19 DIAGNOSIS — J431 Panlobular emphysema: Secondary | ICD-10-CM

## 2017-08-19 LAB — BASIC METABOLIC PANEL
BUN / CREAT RATIO: 12 (ref 10–24)
BUN: 24 mg/dL (ref 8–27)
CALCIUM: 8.9 mg/dL (ref 8.6–10.2)
CHLORIDE: 102 mmol/L (ref 96–106)
CO2: 24 mmol/L (ref 20–29)
CREATININE: 2 mg/dL — AB (ref 0.76–1.27)
GFR calc Af Amer: 38 mL/min/{1.73_m2} — ABNORMAL LOW (ref >59)
GFR calc non Af Amer: 33 mL/min/{1.73_m2} — ABNORMAL LOW (ref >59)
GLUCOSE: 95 mg/dL (ref 65–99)
Potassium: 3.5 mmol/L (ref 3.5–5.2)
Sodium: 145 mmol/L — ABNORMAL HIGH (ref 134–144)

## 2017-08-19 LAB — LIPID PANEL
CHOLESTEROL TOTAL: 110 mg/dL (ref 100–199)
Chol/HDL Ratio: 2.5 ratio (ref 0.0–5.0)
HDL: 44 mg/dL (ref >39)
LDL CALC: 52 mg/dL (ref 0–99)
Triglycerides: 68 mg/dL (ref 0–149)
VLDL CHOLESTEROL CAL: 14 mg/dL (ref 5–40)

## 2017-08-19 LAB — HEPATIC FUNCTION PANEL
ALK PHOS: 89 IU/L (ref 39–117)
ALT: 12 IU/L (ref 0–44)
AST: 14 IU/L (ref 0–40)
Albumin: 4.1 g/dL (ref 3.6–4.8)
BILIRUBIN TOTAL: 1.6 mg/dL — AB (ref 0.0–1.2)
BILIRUBIN, DIRECT: 0.78 mg/dL — AB (ref 0.00–0.40)
Total Protein: 6.8 g/dL (ref 6.0–8.5)

## 2017-08-19 NOTE — Patient Instructions (Signed)
Medication Instructions:  Your physician recommends that you continue on your current medications as directed. Please refer to the Current Medication list given to you today.  Labwork: Your physician recommends that you have the following labs drawn: BMP, liver and lipid panel.  Testing/Procedures: None  Follow-Up: Your physician recommends that you schedule a follow-up appointment in: 3 months  Any Other Special Instructions Will Be Listed Below (If Applicable).   Referral Orders     Ambulatory referral to Pulmonology Their office should be contacting you for an appointment within one week, please call our office if they do not.   If you need a refill on your cardiac medications before your next appointment, please call your pharmacy.   [place], RN, BSN

## 2017-08-19 NOTE — Progress Notes (Signed)
Cardiology Office Note:    Date:  08/19/2017   ID:  Douglas Villa, DOB [birth year], MRN [id number]  PCP:  Lillard Anes, MD  Cardiologist:  Jenean Lindau, MD   Referring MD: Lillard Anes,*    ASSESSMENT:    1. Coronary artery disease involving native coronary artery of native heart without angina pectoris   2. Essential hypertension   3. Chronic kidney disease, stage 3 (HCC)   4. Panlobular emphysema ([place])   5. Mixed dyslipidemia   6. Obstructive sleep apnea    PLAN:    In order of problems listed above:  1. Secondary prevention stressed with the patient.  Importance of compliance with diet and medications stressed and he vocalized understanding. 2. Congestive heart failure education was given to the patient.  Diet was discussed.  He was advised to weigh himself on a regular basis. 3. Echocardiogram report was discussed.  The patient's ejection fraction continues to be low.  He is not taking great care of himself.  He has not exercised on a regular basis he has had history of sleep apnea but I am not sure that has been addressed I discussed with him these issues at extensive length.  For this reason I will refer him to my colleague who is a pulmonologist and a sleep study specialist for attending to these 2 issues. 4. He will have a Chem-7 liver lipid check today.  In view of depressed ejection fraction I discussed with him sudden cardiac death primary prevention.  He is not interested.  Benefits of this therapy discussed.  Potential risks of not having it including sudden cardiac death discussed he vocalized understanding and he is still not working.  He feels changes his mind he will let us know.  He was advised never to go back to smoking. 5. Patient will be seen in follow-up appointment in 6 months or earlier if the patient has any concerns    Medication Adjustments/Labs and Tests Ordered: Current medicines are reviewed at length with the patient today.   Concerns regarding medicines are outlined above.  No orders of the defined types were placed in this encounter.  No orders of the defined types were placed in this encounter.    Chief Complaint  Patient presents with  . Follow-up     History of Present Illness:    Douglas Villa is a 67 y.o. male.  The patient has history of advanced cardiomyopathy.  He denies any problems at this time and takes care of activities of daily living.  No chest pain orthopnea or PND.  He leads a sedentary lifestyle.  Past Medical History:  Diagnosis Date  . Abnormal EKG 03/2009  . CAD (coronary artery disease)   . CKD (chronic kidney disease)   . Heart failure with preserved left ventricular function (HFpEF) ([place])   . Hypertension   . OSA (obstructive sleep apnea)   . Sexual dysfunction     Past Surgical History:  Procedure Laterality Date  . APPENDECTOMY  1969  . CARDIAC CATHETERIZATION    . CORONARY ANGIOPLASTY      Current Medications: Current Meds  Medication Sig  . ACCU-CHEK FASTCLIX LANCETS MISC by Does not apply route.  Marland Kitchen albuterol (PROVENTIL HFA;VENTOLIN HFA) 108 (90 Base) MCG/ACT inhaler Inhale 2 puffs into the lungs every 6 (six) hours as needed for wheezing or shortness of breath.  Marland Kitchen aspirin 325 MG tablet Take 325 mg by mouth daily.    Marland Kitchen atorvastatin (LIPITOR)  40 MG tablet Take 1 tablet by mouth daily.  . Blood Glucose Monitoring Suppl (ACCU-CHEK AVIVA PLUS) w/Device KIT by Does not apply route.  . carvedilol (COREG) 12.5 MG tablet Take 1 tablet by mouth daily.  . furosemide (LASIX) 40 MG tablet Take 2 tablets by mouth 2 (two) times daily.   . glucose blood test strip 1 each by Other route as needed. Use as instructed  . Multiple Vitamin (MULTIVITAMIN) capsule Take 1 capsule by mouth daily.    . nitroGLYCERIN (NITROSTAT) 0.4 MG SL tablet Place 0.4 mg under the tongue every 5 (five) minutes as needed for chest pain.  . potassium chloride SA (K-DUR,KLOR-CON) 20 MEQ tablet Take 4  tablets (80 mEq total) by mouth daily.  . umeclidinium-vilanterol (ANORO ELLIPTA) 62.5-25 MCG/INH AEPB Inhale 1 puff into the lungs daily.     Allergies:   Penicillins   Social History   Socioeconomic History  . Marital status: Single    Spouse name: Not on file  . Number of children: Not on file  . Years of education: Not on file  . Highest education level: Not on file  Occupational History  . Not on file  Social Needs  . Financial resource strain: Not on file  . Food insecurity:    Worry: Not on file    Inability: Not on file  . Transportation needs:    Medical: Not on file    Non-medical: Not on file  Tobacco Use  . Smoking status: Former Smoker    Packs/day: 1.00    Years: 45.00    Pack years: 45.00    Types: Cigarettes    Last attempt to quit: 03/31/2017    Years since quitting: 0.3  . Smokeless tobacco: Never Used  Substance and Sexual Activity  . Alcohol use: No  . Drug use: No  . Sexual activity: Not on file  Lifestyle  . Physical activity:    Days per week: Not on file    Minutes per session: Not on file  . Stress: Not on file  Relationships  . Social connections:    Talks on phone: Not on file    Gets together: Not on file    Attends religious service: Not on file    Active member of club or organization: Not on file    Attends meetings of clubs or organizations: Not on file    Relationship status: Not on file  Other Topics Concern  . Not on file  Social History Narrative  . Not on file     Family History: The patient's family history includes Cancer (age of onset: 73) in his mother; Stroke (age of onset: 64) in his father.  ROS:   Please see the history of present illness.    All other systems reviewed and are negative.  EKGs/Labs/Other Studies Reviewed:    The following studies were reviewed today: Echocardiogram report was discussed with the patient at length.   Recent Labs: 07/07/2017: BUN 23; Creatinine, Ser 2.06; Potassium 3.3; Sodium  146  Recent Lipid Panel No results found for: CHOL, TRIG, HDL, CHOLHDL, VLDL, LDLCALC, LDLDIRECT  Physical Exam:    VS:  BP 120/80 (BP Location: Right Arm, Patient Position: Sitting, Cuff Size: Normal)   Pulse 71   Ht 5' 4.96" (1.65 m)   Wt 191 lb (86.6 kg)   SpO2 98%   BMI 31.82 kg/m     Wt Readings from Last 3 Encounters:  08/19/17 191 lb (86.6 kg)  08/05/17   190 lb 6.4 oz (86.4 kg)  07/13/17 189 lb 9.6 oz (86 kg)     GEN: Patient is in no acute distress HEENT: Normal NECK: No JVD; No carotid bruits LYMPHATICS: No lymphadenopathy CARDIAC: Hear sounds regular, 2/6 systolic murmur at the apex. RESPIRATORY:  Clear to auscultation without rales, wheezing or rhonchi  ABDOMEN: Soft, non-tender, non-distended MUSCULOSKELETAL:  No edema; No deformity  SKIN: Warm and dry NEUROLOGIC:  Alert and oriented x 3 PSYCHIATRIC:  Normal affect   Signed, Jenean Lindau, MD  08/19/2017 8:34 AM    [place]

## 2017-08-22 ENCOUNTER — Encounter: Payer: Self-pay | Admitting: Pulmonary Disease

## 2017-08-30 ENCOUNTER — Telehealth (HOSPITAL_COMMUNITY): Payer: Self-pay | Admitting: Vascular Surgery

## 2017-08-30 NOTE — Telephone Encounter (Signed)
Returned pt call to reschedule NEW PT appt 09/02/17

## 2017-09-02 ENCOUNTER — Encounter (HOSPITAL_COMMUNITY): Payer: Self-pay | Admitting: Cardiology

## 2017-09-02 ENCOUNTER — Ambulatory Visit (HOSPITAL_COMMUNITY)
Admission: RE | Admit: 2017-09-02 | Discharge: 2017-09-02 | Disposition: A | Discharge status: Home / Self Care | Payer: Self-pay | Source: Ambulatory Visit | Attending: Cardiology | Admitting: Cardiology

## 2017-09-02 ENCOUNTER — Telehealth (HOSPITAL_COMMUNITY): Payer: Self-pay

## 2017-09-02 VITALS — BP 116/78 | HR 69 | Wt 188.2 lb

## 2017-09-02 DIAGNOSIS — I2510 Atherosclerotic heart disease of native coronary artery without angina pectoris: Secondary | ICD-10-CM | POA: Insufficient documentation

## 2017-09-02 DIAGNOSIS — Z955 Presence of coronary angioplasty implant and graft: Secondary | ICD-10-CM | POA: Insufficient documentation

## 2017-09-02 DIAGNOSIS — Z79899 Other long term (current) drug therapy: Secondary | ICD-10-CM | POA: Insufficient documentation

## 2017-09-02 DIAGNOSIS — G4733 Obstructive sleep apnea (adult) (pediatric): Secondary | ICD-10-CM | POA: Insufficient documentation

## 2017-09-02 DIAGNOSIS — Z87891 Personal history of nicotine dependence: Secondary | ICD-10-CM | POA: Insufficient documentation

## 2017-09-02 DIAGNOSIS — I429 Cardiomyopathy, unspecified: Secondary | ICD-10-CM | POA: Insufficient documentation

## 2017-09-02 DIAGNOSIS — E785 Hyperlipidemia, unspecified: Secondary | ICD-10-CM | POA: Insufficient documentation

## 2017-09-02 DIAGNOSIS — Z8673 Personal history of transient ischemic attack (TIA), and cerebral infarction without residual deficits: Secondary | ICD-10-CM | POA: Insufficient documentation

## 2017-09-02 DIAGNOSIS — N183 Chronic kidney disease, stage 3 unspecified: Secondary | ICD-10-CM

## 2017-09-02 DIAGNOSIS — I130 Hypertensive heart and chronic kidney disease with heart failure and stage 1 through stage 4 chronic kidney disease, or unspecified chronic kidney disease: Secondary | ICD-10-CM | POA: Insufficient documentation

## 2017-09-02 DIAGNOSIS — I5022 Chronic systolic (congestive) heart failure: Secondary | ICD-10-CM | POA: Insufficient documentation

## 2017-09-02 DIAGNOSIS — Z7902 Long term (current) use of antithrombotics/antiplatelets: Secondary | ICD-10-CM | POA: Insufficient documentation

## 2017-09-02 DIAGNOSIS — Z7982 Long term (current) use of aspirin: Secondary | ICD-10-CM | POA: Insufficient documentation

## 2017-09-02 DIAGNOSIS — J449 Chronic obstructive pulmonary disease, unspecified: Secondary | ICD-10-CM | POA: Insufficient documentation

## 2017-09-02 LAB — BASIC METABOLIC PANEL
Anion gap: 10 (ref 5–15)
BUN: 25 mg/dL — ABNORMAL HIGH (ref 8–23)
CHLORIDE: 103 mmol/L (ref 98–111)
CO2: 27 mmol/L (ref 22–32)
CREATININE: 1.85 mg/dL — AB (ref 0.61–1.24)
Calcium: 8.4 mg/dL — ABNORMAL LOW (ref 8.9–10.3)
GFR, EST AFRICAN AMERICAN: 41 mL/min — AB (ref >60)
GFR, EST NON AFRICAN AMERICAN: 36 mL/min — AB (ref >60)
Glucose, Bld: 216 mg/dL — ABNORMAL HIGH (ref 70–99)
POTASSIUM: 2.7 mmol/L — AB (ref 3.5–5.1)
SODIUM: 140 mmol/L (ref 135–145)

## 2017-09-02 LAB — BRAIN NATRIURETIC PEPTIDE: B NATRIURETIC PEPTIDE 5: 1943.8 pg/mL — AB (ref 0.0–100.0)

## 2017-09-02 MED ORDER — ASPIRIN 81 MG PO TABS: 81.0000 mg | ORAL_TABLET | Freq: Every day | ORAL | 3 refills | Status: DC

## 2017-09-02 MED ORDER — TORSEMIDE 20 MG PO TABS: 80.0000 mg | ORAL_TABLET | Freq: Two times a day (BID) | ORAL | 11 refills | Status: DC

## 2017-09-02 MED ORDER — ISOSORBIDE MONONITRATE ER 30 MG PO TB24: 30.0000 mg | ORAL_TABLET | Freq: Every day | ORAL | 11 refills | Status: DC

## 2017-09-02 MED ORDER — HYDRALAZINE HCL 25 MG PO TABS: 25.0000 mg | ORAL_TABLET | Freq: Three times a day (TID) | ORAL | 11 refills | Status: DC

## 2017-09-02 MED ORDER — CARVEDILOL 6.25 MG PO TABS: 6.2500 mg | ORAL_TABLET | Freq: Every day | ORAL | 11 refills | Status: DC

## 2017-09-02 MED ORDER — ATORVASTATIN CALCIUM 80 MG PO TABS: 80.0000 mg | ORAL_TABLET | Freq: Every day | ORAL | 3 refills | Status: DC

## 2017-09-02 MED ORDER — CLOPIDOGREL BISULFATE 75 MG PO TABS: 75.0000 mg | ORAL_TABLET | Freq: Every day | ORAL | 3 refills | Status: DC

## 2017-09-02 NOTE — Telephone Encounter (Signed)
Critical serum K received from patient's visit with Dr. Aundra Dubin in [place] clinic today. Patient does report missing past two days. Educated on importance of med compliance and potassium levels. Patient will take 40 TID per Dr. Aundra Dubin VO, then 40 bid until recheck next week.  Renee Pain, RN

## 2017-09-02 NOTE — Patient Instructions (Signed)
STOP Aspirin 325 mg tablet.  START Aspirin 81 mg tablet once daily.  START Clopidogril (Plavix) 75 mg tablet once daily.  CHANGE Carvedilol (Coreg) to 6.25 mg twice daily. Can half current 12.5 mg tablets you have at home (Take 1/2 tablet twice daily). New Rx has been sent to your pharmacy for 6.25 mg tablets (Take 1 tab twice daily).  CONTINUE Atorvastattin (Lipitor) 80 mg once daily. Can continue taking two of the 40 mg tablets you have at home. Will send in 80 mg tablet to your pharmacy (Take 1 tab once daily).  START Hydralazine 25 mg tablet three times daily (Once every 8 hours).\ Set alarm on your smart phone to help remind you of each dose due time.  START Imdur 30 mg tablet once daily.  STOP Lasix.  START Torsemide 80 mg (4 tabs) once every morning.  Will refer you to electrophysiology at [facility]. Address: [address] ([place]), [place], [place] [address]  Phone: [phone] Their office will call you to schedule.  Follow up 1-2 weeks with Dr. Claris Gladden NP/PA.  _______________________________________________________________ [place] Roberts Code: 1500  Follow up next month with Dr. Aundra Dubin.  ________________________________________________________________ Marin Roberts Code: 8882  Take all medication as prescribed the day of your appointment. Bring all medications with you to your appointment.  Do the following things EVERYDAY: 1) Weigh yourself in the morning before breakfast. Write it down and keep it in a log. 2) Take your medicines as prescribed 3) Eat low salt foods-Limit salt (sodium) to 2000 mg per day.  4) Stay as active as you can everyday 5) Limit all fluids for the day to less than 2 liters

## 2017-09-03 LAB — HIV ANTIBODY (ROUTINE TESTING W REFLEX): HIV SCREEN 4TH GENERATION: NONREACTIVE

## 2017-09-03 NOTE — Progress Notes (Signed)
PCP: Dr. Henrene Pastor Cardiology: Dr. Geraldo Pitter HF Cardiology: Dr. Aundra Dubin  67 yo with history of CAD and cardiomyopathy referred by Dr. Geraldo Pitter for evaluation of CHF.  Patient has had a history of HTN and smoking (quit smoking in 2/19) with COPD.  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  He had had one prior episode of CHF in 2016 where he was admitted at [facility] (no records available).  At the hospital in [place], he had LHC showed occluded PLV and 80% stenosis proximal LCx.  EF was 25-35% by LV-gram.  He had DES placed to pLCx.  He is not talking clopidogrel or ticagrelor.  He does not think he ever got it after his stent was placed.   Echo was done in 6/19.  This showed EF 25-30% with moderate-severe MR, moderate-severe TR.    Since 3/19, he has had exertional dyspnea.  He is short of breath walking for 50-100 yards or walking up an incline.  Stairs are difficult.  He has orthopnea, cannot lie flat in bed.  Occasional episodes of PND.  No chest pain or tightness. No palpitations.  At times, short of breath with dressing/showering.    Labs (7/19): LDL 52, HDL 44, K 3.5, creatinine 2  ECG (personally reviewed): NSR, 1st degree AVB, nonspecific TW flattening, QRS 114 msec  PMH: 1. HTN 2. CKD stage 3 3. COPD: Quit smoking in 2/19.  4. OSA 5. Hyperlipidemia 6. 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   - LHC (3/19): Done in [place].  Totally occluded PLV, 80% pLCx stenosis => DES to LCx.  LV-gram with EF 25-35%. - Echo (6/19): EF 25-30%, grade III diastolic dysfunction, moderate-severe MR, moderate-severe TR, PASP 57 mmHg.   SH: Lives alone in [place], [place].  Quit smoking in 2/19. Rare ETOH.   Family History  Problem Relation Age of Onset  . Cancer Mother 36  . Stroke Father 80   ROS: All systems reviewed and negative except as per  HPI.  Current Outpatient Medications  Medication Sig Dispense Refill  . ACCU-CHEK FASTCLIX LANCETS MISC by Does not apply route.    Marland Kitchen albuterol (PROVENTIL HFA;VENTOLIN HFA) 108 (90 Base) MCG/ACT inhaler Inhale 2 puffs into the lungs every 6 (six) hours as needed for wheezing or shortness of breath. 1 Inhaler 2  . aspirin 81 MG tablet Take 1 tablet (81 mg total) by mouth daily. 90 tablet 3  . atorvastatin (LIPITOR) 80 MG tablet Take 1 tablet (80 mg total) by mouth daily. 90 tablet 3  . Blood Glucose Monitoring Suppl (ACCU-CHEK AVIVA PLUS) w/Device KIT by Does not apply route.    . carvedilol (COREG) 6.25 MG tablet Take 1 tablet (6.25 mg total) by mouth daily. 60 tablet 11  . glucose blood test strip 1 each by Other route as needed. Use as instructed    . Multiple Vitamin (MULTIVITAMIN) capsule Take 1 capsule by mouth daily.      . nitroGLYCERIN (NITROSTAT) 0.4 MG SL tablet Place 0.4 mg under the tongue every 5 (five) minutes as needed for chest pain.    . potassium chloride SA (K-DUR,KLOR-CON) 20 MEQ tablet Take 4 tablets (80 mEq total) by mouth daily. 90 tablet 3  . umeclidinium-vilanterol (ANORO ELLIPTA) 62.5-25 MCG/INH AEPB Inhale 1 puff into the lungs daily. 60 each 5  . clopidogrel (PLAVIX) 75 MG tablet Take 1 tablet (75 mg total) by  mouth daily. 90 tablet 3  . hydrALAZINE (APRESOLINE) 25 MG tablet Take 1 tablet (25 mg total) by mouth 3 (three) times daily. 90 tablet 11  . isosorbide mononitrate (IMDUR) 30 MG 24 hr tablet Take 1 tablet (30 mg total) by mouth daily. 30 tablet 11  . torsemide (DEMADEX) 20 MG tablet Take 4 tablets (80 mg total) by mouth 2 (two) times daily. 120 tablet 11   No current facility-administered medications for this encounter.    BP 116/78   Pulse 69   Wt 188 lb 3.2 oz (85.4 kg)   SpO2 100%   BMI 31.36 kg/m  General: NAD Neck: JVP 12 cm, no thyromegaly or thyroid nodule.  Lungs: Distant breath sounds. CV: Nondisplaced PMI.  Heart regular S1/S2, no S3/S4, 2/6  HSM LLSB/apex.  1+ edema to knees bilaterally.  No carotid bruit.  Normal pedal pulses.  Abdomen: Soft, nontender, no hepatosplenomegaly, no distention.  Skin: Intact without lesions or rashes.  Neurologic: Alert and oriented x 3.  Psych: Normal affect. Extremities: No clubbing or cyanosis.  HEENT: Normal.   Assessment/Plan: 1. Chronic systolic CHF: Echo 3/34 with EF 25-30%.  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  LV dysfunction is out of proportion to degree of coronary disease.  NYHA class III symptoms. He is volume overloaded on exam.  - Stop Lasix and start torsemide 80 mg daily.  BMET today and repeat in 10 days.  - Change Coreg to 6.25 mg bid (rather than 12.5 mg daily).  - Start hydralazine 25 mg tid and Imdur 30 daily (he does not have insurance coverage for Bidil). - Watch creatinine closely, hopefully can eventually start Entresto and spironolactone but want to recheck creatinine first, was 2 most recently.  - Narrow QRS, not CRT candidate. With persistently low EF, will refer to EP for ICD consideration.  - We discussed 1500 mg Na restriction and < 2L po fluid.  - Check HIV and myeloma panel.  - CPX when medications and volume optimized.  2. CAD: DES to pLCx in 3/19.  He has not been on Plavix or ticagrelor.  - He needs to start Plavix, will give him prescription to start today.  - He can decrease ASA to 81 mg daily.   - Continue atorvastatin 80 mg daily, good lipids in 7/19.  3. CKD: Stage 3.  BMET today, will need to watch closely with diuresis.   Followup 1 week with APP and 1 month with me. Will need close followup.   Loralie Champagne 09/03/2017

## 2017-09-06 LAB — MULTIPLE MYELOMA PANEL, SERUM
ALBUMIN/GLOB SERPL: 1.1 (ref 0.7–1.7)
ALPHA 1: 0.3 g/dL (ref 0.0–0.4)
ALPHA2 GLOB SERPL ELPH-MCNC: 0.5 g/dL (ref 0.4–1.0)
Albumin SerPl Elph-Mcnc: 3.4 g/dL (ref 2.9–4.4)
B-Globulin SerPl Elph-Mcnc: 0.9 g/dL (ref 0.7–1.3)
Gamma Glob SerPl Elph-Mcnc: 1.4 g/dL (ref 0.4–1.8)
Globulin, Total: 3.1 g/dL (ref 2.2–3.9)
IGG (IMMUNOGLOBIN G), SERUM: 1627 mg/dL — AB (ref 700–1600)
IgA: 289 mg/dL (ref 61–437)
IgM (Immunoglobulin M), Srm: 30 mg/dL (ref 20–172)
TOTAL PROTEIN ELP: 6.5 g/dL (ref 6.0–8.5)

## 2017-09-09 NOTE — Progress Notes (Signed)
Advanced Heart Failure Clinic Note   PCP: Dr. Henrene Pastor Cardiology: Dr. Geraldo Pitter HF Cardiology: Dr. Vivi Ferns is a 67 y.o. male with history of CAD and cardiomyopathy referred by Dr. Geraldo Pitter for evaluation of CHF.  Patient has had a history of HTN and smoking (quit smoking in 2/19) with COPD.  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  He had had one prior episode of CHF in 2016 where he was admitted at [facility] (no records available).  At the hospital in [place], he had LHC showed occluded PLV and 80% stenosis proximal LCx.  EF was 25-35% by LV-gram.  He had DES placed to pLCx.  He was not talking clopidogrel or ticagrelor.  He does not think he ever got it after his stent was placed.   Echo was done in 6/19.  This showed EF 25-30% with moderate-severe MR, moderate-severe TR.    He presents today for 1 week follow up. Last visit was volume overloaded. Lasix was changed to torsemide 80 mg daily. Coreg was decreased and plavix was started. He was also started on hydralazine and imdur. Overall doing much better. Torsemide was called in as 80 mg BID instead of daily, so he has been taking BID. Weight is down 16 lbs in 10 days. SOB is much improved. Able to walk uphill with only mild SOB. No problems with grocery shopping. No orthopnea, PND, or edema. Denies CP or dizziness. He still has a productive cough with white/clear sputum. No fever/chills. He is not weighing at home. Taking all medications.   Labs (7/19): LDL 52, HDL 44, K 3.5, creatinine 2  ECG (personally reviewed): NSR, 1st degree AVB, nonspecific TW flattening, QRS 114 msec  PMH: 1. HTN 2. CKD stage 3 3. COPD: Quit smoking in 2/19.  4. OSA 5. Hyperlipidemia 6. 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   - LHC (3/19): Done in [place].  Totally occluded PLV, 80% pLCx stenosis => DES  to LCx.  LV-gram with EF 25-35%. - Echo (6/19): EF 25-30%, grade III diastolic dysfunction, moderate-severe MR, moderate-severe TR, PASP 57 mmHg.   SH: Lives alone in [place], [place].  Quit smoking in 2/19. Rare ETOH.   Family History  Problem Relation Age of Onset  . Cancer Mother 33  . Stroke Father 53   Review of systems complete and found to be negative unless listed in HPI.   Current Outpatient Medications  Medication Sig Dispense Refill  . ACCU-CHEK FASTCLIX LANCETS MISC by Does not apply route.    Marland Kitchen albuterol (PROVENTIL HFA;VENTOLIN HFA) 108 (90 Base) MCG/ACT inhaler Inhale 2 puffs into the lungs every 6 (six) hours as needed for wheezing or shortness of breath. 1 Inhaler 2  . aspirin 81 MG tablet Take 1 tablet (81 mg total) by mouth daily. 90 tablet 3  . atorvastatin (LIPITOR) 80 MG tablet Take 1 tablet (80 mg total) by mouth daily. 90 tablet 3  . Blood Glucose Monitoring Suppl (ACCU-CHEK AVIVA PLUS) w/Device KIT by Does not apply route.    . carvedilol (COREG) 6.25 MG tablet Take 1 tablet (6.25 mg total) by mouth daily. 60 tablet 11  . clopidogrel (PLAVIX) 75 MG tablet Take 1 tablet (75 mg total) by mouth daily. 90 tablet 3  . glucose blood test strip 1 each by Other route as needed. Use as instructed    . hydrALAZINE (APRESOLINE)  25 MG tablet Take 25 mg by mouth. 50 mg in the AM and 25 mg in the PM    . isosorbide mononitrate (IMDUR) 30 MG 24 hr tablet Take 1 tablet (30 mg total) by mouth daily. 30 tablet 11  . Multiple Vitamin (MULTIVITAMIN) capsule Take 1 capsule by mouth daily.      . nitroGLYCERIN (NITROSTAT) 0.4 MG SL tablet Place 0.4 mg under the tongue every 5 (five) minutes as needed for chest pain.    . potassium chloride SA (K-DUR,KLOR-CON) 20 MEQ tablet Take 4 tablets (80 mEq total) by mouth daily. 90 tablet 3  . torsemide (DEMADEX) 20 MG tablet Take 4 tablets (80 mg total) by mouth 2 (two) times daily. 120 tablet 11  . umeclidinium-vilanterol (ANORO ELLIPTA)  62.5-25 MCG/INH AEPB Inhale 1 puff into the lungs daily. 60 each 5   No current facility-administered medications for this encounter.    BP 120/74   Pulse 82   Wt 78 kg (172 lb)   SpO2 98%   BMI 28.66 kg/m   Wt Readings from Last 3 Encounters:  09/12/17 78 kg (172 lb)  09/02/17 85.4 kg (188 lb 3.2 oz)  08/19/17 86.6 kg (191 lb)   General:  No resp difficulty. HEENT: Normal Neck: Supple. JVP 6-7. Carotids 2+ bilat; no bruits. No thyromegaly or nodule noted. Cor: PMI nondisplaced. RRR, 2/6 HSM LLSB/apex Lungs: diminished Abdomen: Soft, non-tender, non-distended, no HSM. No bruits or masses. +BS  Extremities: No cyanosis, clubbing, or rash. R and LLE no edema.  Neuro: Alert & orientedx3, cranial nerves grossly intact. moves all 4 extremities w/o difficulty. Affect pleasant  ReDS clip: 42%  Assessment/Plan: 1. Chronic systolic CHF: Echo 5/72 with EF 25-30%.  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  LV dysfunction is out of proportion to degree of coronary disease.   - Improved NYHA class II symptoms.  - Volume status much improved, but still elevated on ReDS clip (42%) - Continue torsemide 80 mg BID (was supposed to take torsemide 80 mg daily, but was called in as 80 mg BID). Continue for now. BMET today. - Continue Coreg 6.25 mg bid  - Continue hydralazine 25 mg TID and Imdur 30 daily. - Consider spiro and Entresto once creatinine improves.   - With persistently low EF, has been referred to EP for ICD consideration. Narrow QRS, not CRT candidate. I do not see that he has seen EP yet. Will need to follow up on this next visit.  - HIV and myeloma panel negative.  - CPX when medications and volume optimized. Consider scheduling next visit.  2. CAD: DES to pLCx in 3/19.   - Continue plavix, ASA - Continue atorvastatin 80 mg daily, good lipids in 7/19. - No s/s ischemia.   3. CKD: Stage 3.   - BMET today 4. Hypokalemia - K 2.7 on 8/2. Supp increased. Check BMET  today  BMET today Start spiro 12.5 mg qHS if BMET comes back okay. Will call and let him know either way. Creatinine 1.85 on 8/2 BMET 7 days Keep torsemide at 80 mg BID. May have to cut back if creatinine worse.  Follow up in 3 weeks with APP Keep follow up with Dr Aundra Dubin end of September  Georgiana Shore, NP 09/12/2017  Greater than 50% of the 25 minute visit was spent in counseling/coordination of care regarding disease state education, salt/fluid restriction, sliding scale diuretics, and medication compliance.

## 2017-09-12 ENCOUNTER — Ambulatory Visit (HOSPITAL_COMMUNITY)
Admission: RE | Admit: 2017-09-12 | Discharge: 2017-09-12 | Disposition: A | Discharge status: Home / Self Care | Payer: Self-pay | Source: Ambulatory Visit | Attending: Cardiology | Admitting: Cardiology

## 2017-09-12 ENCOUNTER — Encounter (HOSPITAL_COMMUNITY): Payer: Self-pay

## 2017-09-12 VITALS — BP 120/74 | HR 82 | Wt 172.0 lb

## 2017-09-12 DIAGNOSIS — Z7902 Long term (current) use of antithrombotics/antiplatelets: Secondary | ICD-10-CM | POA: Insufficient documentation

## 2017-09-12 DIAGNOSIS — I130 Hypertensive heart and chronic kidney disease with heart failure and stage 1 through stage 4 chronic kidney disease, or unspecified chronic kidney disease: Secondary | ICD-10-CM | POA: Insufficient documentation

## 2017-09-12 DIAGNOSIS — Z09 Encounter for follow-up examination after completed treatment for conditions other than malignant neoplasm: Secondary | ICD-10-CM | POA: Insufficient documentation

## 2017-09-12 DIAGNOSIS — E785 Hyperlipidemia, unspecified: Secondary | ICD-10-CM | POA: Insufficient documentation

## 2017-09-12 DIAGNOSIS — Z7982 Long term (current) use of aspirin: Secondary | ICD-10-CM | POA: Insufficient documentation

## 2017-09-12 DIAGNOSIS — G4733 Obstructive sleep apnea (adult) (pediatric): Secondary | ICD-10-CM | POA: Insufficient documentation

## 2017-09-12 DIAGNOSIS — N183 Chronic kidney disease, stage 3 unspecified: Secondary | ICD-10-CM

## 2017-09-12 DIAGNOSIS — I5022 Chronic systolic (congestive) heart failure: Secondary | ICD-10-CM | POA: Insufficient documentation

## 2017-09-12 DIAGNOSIS — Z809 Family history of malignant neoplasm, unspecified: Secondary | ICD-10-CM | POA: Insufficient documentation

## 2017-09-12 DIAGNOSIS — E876 Hypokalemia: Secondary | ICD-10-CM | POA: Insufficient documentation

## 2017-09-12 DIAGNOSIS — Z79899 Other long term (current) drug therapy: Secondary | ICD-10-CM | POA: Insufficient documentation

## 2017-09-12 DIAGNOSIS — I2510 Atherosclerotic heart disease of native coronary artery without angina pectoris: Secondary | ICD-10-CM | POA: Insufficient documentation

## 2017-09-12 DIAGNOSIS — I429 Cardiomyopathy, unspecified: Secondary | ICD-10-CM | POA: Insufficient documentation

## 2017-09-12 DIAGNOSIS — Z823 Family history of stroke: Secondary | ICD-10-CM | POA: Insufficient documentation

## 2017-09-12 DIAGNOSIS — Z87891 Personal history of nicotine dependence: Secondary | ICD-10-CM | POA: Insufficient documentation

## 2017-09-12 DIAGNOSIS — J449 Chronic obstructive pulmonary disease, unspecified: Secondary | ICD-10-CM | POA: Insufficient documentation

## 2017-09-12 LAB — BASIC METABOLIC PANEL
Anion gap: 10 (ref 5–15)
BUN: 22 mg/dL (ref 8–23)
CHLORIDE: 105 mmol/L (ref 98–111)
CO2: 27 mmol/L (ref 22–32)
CREATININE: 1.76 mg/dL — AB (ref 0.61–1.24)
Calcium: 8.7 mg/dL — ABNORMAL LOW (ref 8.9–10.3)
GFR calc Af Amer: 44 mL/min — ABNORMAL LOW (ref >60)
GFR calc non Af Amer: 38 mL/min — ABNORMAL LOW (ref >60)
GLUCOSE: 144 mg/dL — AB (ref 70–99)
Potassium: 3.5 mmol/L (ref 3.5–5.1)
Sodium: 142 mmol/L (ref 135–145)

## 2017-09-12 MED ORDER — SPIRONOLACTONE 25 MG PO TABS: 12.5000 mg | ORAL_TABLET | Freq: Every day | ORAL | 3 refills | Status: DC

## 2017-09-12 NOTE — Progress Notes (Signed)
ReDS Vest - 09/12/17 1100      ReDS Vest   Fitting Posture  Sitting    Height Marker  --   Station C   Ruler Value  27    ReDS Value  42

## 2017-09-12 NOTE — Patient Instructions (Addendum)
Start Spironolactone 12.5 mg (1/2 tab) daily  Labs today  Labs in 1 week  Your physician recommends that you schedule a follow-up appointment in: 3 weeks

## 2017-09-15 NOTE — Progress Notes (Signed)
$'@Patient'O$  ID: Douglas Villa, male    DOB: Sep 21, 1950, 67 y.o.   MRN: [id number]  Chief Complaint  Patient presents with  . Follow-up    SOB / Follow up     Referring provider: Lillard Anes  HPI: 67 year old male former smoker followed in our office for obstructive sleep apnea managed with CPAP.  PMH: Chronic kidney disease, heart failure with reduced ejection fraction (25 to 30%), hypertension Smoker/ Smoking History: Former smoker.  Quit in February/2019.  50-pack-year smoking history. Maintenance: Anoro Ellipta Pt of: Dr. Halford Chessman  Recent [facility] Encounters:   08/05/17 OV - With PFT  Patient presenting today for office visit after completing pulmonary function test.  Patient reporting continued to have issues with his breathing and feeling increasingly short of breath.  Patient is also brought his CPAP to our office for compliance download.  Pulmonary function test today showing moderate obstruction, bronchodilator response, small airway disease with reversibility, and severely diminished DLCO of 17.  Concavity and flow volume loops.  Patient is not currently on any controller therapies at this time.  Patient does not have a rescue inhaler at this time.CPAP compliance report showing 19 of the last 30 days with CPAP use.  For an average of 63%.  Average usage on days used is 4 hours and 35 minutes.  Only 33% of the time is patient getting greater than 4 hours to use based off of report.  AHI is 7.6.  Patient walked in office today.  Oxygen saturations remained above 98%.  On room air.Patient reporting he is continuing to not smoke.  Unfortunately patient has friends and family who still smoke.  Plan: Continue CPAP, therapeutic trial of Anoro Ellipta patient needs pneumonia vaccines    Tests:   08/05/2017-pulmonary function test- ratio 68, FEV1 61, significant bronchodilator response, FVC 58 (+15 postbronchodilator to 67), DLCO 17 >>>moderate obstruction with small  airway disease, positive bronchodilator response, severe diffusion defect  Imaging:  07/13/2017-chest x-ray- enlargement of cardiac silhouette with small right pleural effusion chronic right basilar atelectasis, due to somewhat nodular appearance of the opacity at the right lung base recommend follow-up radiographs until resolution to exclude underlying pulmonary nodule  Cardiac:  07/21/2017-echocardiogram-LV ejection fraction 25 to 18%, systolic function was severely reduced, grade 3 diastolic dysfunction, moderate pulmonary hypertension, PA peak pressure 57  Labs:   Micro:    Chart Review:  >>>We have been unable to obtain sleep study records.  Explained to patient he will probably need a sleep study.  Patient states he does not have insurance and cannot afford this.      09/16/17 OV  67 year old patient following up with our office.  Patient has not seen heart failure specialist as well as cardiologist.  New changes to his diuretic regimen as well as his cardiac medications have the patient feeling quite well.  Patient reports he is lost 24 pounds since last being seen in the office visit.  Patient also reports that Anoro Ellipta inhaler has worked well for him.  Patient is requesting samples and financial assistance with paying for Anoro Ellipta.  Patient reports Anoro Ellipta cost $400.  Patient has been adherent to Anoro Ellipta.  Patient has not been wearing his CPAP.  Patient reports that with his weight loss he has been sleeping much better.  Attempts to obtain CPAP downloads as well as sleep study records have been unsuccessful.    Allergies  Allergen Reactions  . Penicillins     Sweating  There is no immunization history on file for this patient. Patient needs pneumonia vaccines as well as flu vaccine.  Discussed with patient he will need to follow-up with health department in order to obtain these vaccines.  As he has no health insurance.  Patient will need Prevnar  13 first and Pneumovax 23.  Patient can get high-dose flu vaccine when available.   Past Medical History:  Diagnosis Date  . Abnormal EKG 03/2009  . CAD (coronary artery disease)   . CKD (chronic kidney disease)   . Heart failure with preserved left ventricular function (HFpEF) ([place])   . Hypertension   . OSA (obstructive sleep apnea)   . Sexual dysfunction     Tobacco History: Social History   Tobacco Use  Smoking Status Former Smoker  . Packs/day: 1.00  . Years: 45.00  . Pack years: 45.00  . Types: Cigarettes  . Last attempt to quit: 03/31/2017  . Years since quitting: 0.4  Smokeless Tobacco Never Used   Counseling given: Yes Continue not smoking.  Outpatient Encounter Medications as of 09/16/2017  Medication Sig  . ACCU-CHEK FASTCLIX LANCETS MISC by Does not apply route.  Marland Kitchen albuterol (PROVENTIL HFA;VENTOLIN HFA) 108 (90 Base) MCG/ACT inhaler Inhale 2 puffs into the lungs every 6 (six) hours as needed for wheezing or shortness of breath.  Marland Kitchen aspirin 81 MG tablet Take 1 tablet (81 mg total) by mouth daily.  Marland Kitchen atorvastatin (LIPITOR) 80 MG tablet Take 1 tablet (80 mg total) by mouth daily.  . Blood Glucose Monitoring Suppl (ACCU-CHEK AVIVA PLUS) w/Device KIT by Does not apply route.  . carvedilol (COREG) 6.25 MG tablet Take 1 tablet (6.25 mg total) by mouth daily.  . clopidogrel (PLAVIX) 75 MG tablet Take 1 tablet (75 mg total) by mouth daily.  Marland Kitchen glucose blood test strip 1 each by Other route as needed. Use as instructed  . hydrALAZINE (APRESOLINE) 25 MG tablet Take 25 mg by mouth. 50 mg in the AM and 25 mg in the PM  . isosorbide mononitrate (IMDUR) 30 MG 24 hr tablet Take 1 tablet (30 mg total) by mouth daily.  . Multiple Vitamin (MULTIVITAMIN) capsule Take 1 capsule by mouth daily.    . nitroGLYCERIN (NITROSTAT) 0.4 MG SL tablet Place 0.4 mg under the tongue every 5 (five) minutes as needed for chest pain.  . potassium chloride SA (K-DUR,KLOR-CON) 20 MEQ tablet Take 4 tablets  (80 mEq total) by mouth daily.  Marland Kitchen spironolactone (ALDACTONE) 25 MG tablet Take 0.5 tablets (12.5 mg total) by mouth daily.  Marland Kitchen torsemide (DEMADEX) 20 MG tablet Take 4 tablets (80 mg total) by mouth 2 (two) times daily.  Marland Kitchen umeclidinium-vilanterol (ANORO ELLIPTA) 62.5-25 MCG/INH AEPB Inhale 1 puff into the lungs daily.  . [DISCONTINUED] umeclidinium-vilanterol (ANORO ELLIPTA) 62.5-25 MCG/INH AEPB Inhale 1 puff into the lungs daily.  . [DISCONTINUED] umeclidinium-vilanterol (ANORO ELLIPTA) 62.5-25 MCG/INH AEPB Inhale 1 puff into the lungs daily.   No facility-administered encounter medications on file as of 09/16/2017.      Review of Systems  Review of Systems  Constitutional: Negative for activity change, chills, fatigue, fever and unexpected weight change.  HENT: Negative for postnasal drip, rhinorrhea, sinus pressure, sinus pain, sneezing and sore throat.   Respiratory: Negative for cough, shortness of breath and wheezing.   Cardiovascular: Negative for chest pain and palpitations.  Gastrointestinal: Negative for constipation, diarrhea, nausea and vomiting.  Genitourinary: Negative for hematuria and urgency.  Musculoskeletal: Negative for arthralgias.  Skin: Negative for color  change.  Neurological: Negative for dizziness and headaches.  Psychiatric/Behavioral: Negative for dysphoric mood and sleep disturbance (sleeping better ). The patient is not nervous/anxious.   All other systems reviewed and are negative.   MMRC - Breathlessness Score 2 - on level ground, I walk slower than people of the same age because of breathlessness, or have to stop for breathe when walking to my own pace   Physical Exam  BP 126/74 (BP Location: Left Arm, Cuff Size: Normal)   Pulse 80   Ht '5\' 5"'$  (1.651 m)   Wt 166 lb 3.2 oz (75.4 kg)   SpO2 100%   BMI 27.66 kg/m   Wt Readings from Last 5 Encounters:  09/16/17 166 lb 3.2 oz (75.4 kg)  09/12/17 172 lb (78 kg)  09/02/17 188 lb 3.2 oz (85.4 kg)    08/19/17 191 lb (86.6 kg)  08/05/17 190 lb 6.4 oz (86.4 kg)     Physical Exam  Constitutional: He is oriented to person, place, and time and well-developed, well-nourished, and in no distress. No distress.  HENT:  Head: Normocephalic and atraumatic.  Right Ear: Hearing, tympanic membrane, external ear and ear canal normal.  Left Ear: Hearing, tympanic membrane, external ear and ear canal normal.  Nose: Nose normal. Right sinus exhibits no maxillary sinus tenderness and no frontal sinus tenderness. Left sinus exhibits no maxillary sinus tenderness and no frontal sinus tenderness.  Mouth/Throat: Uvula is midline and oropharynx is clear and moist. No oropharyngeal exudate.  Eyes: Pupils are equal, round, and reactive to light.  Neck: Normal range of motion. Neck supple. No JVD present.  Cardiovascular: Normal rate, regular rhythm and normal heart sounds.  Pulmonary/Chest: Effort normal and breath sounds normal. No accessory muscle usage. No respiratory distress. He has no decreased breath sounds. He has no wheezes. He has no rhonchi.  Abdominal: Soft. Bowel sounds are normal. There is no tenderness.  Musculoskeletal: Normal range of motion. He exhibits no edema.  Lymphadenopathy:    He has no cervical adenopathy.  Neurological: He is alert and oriented to person, place, and time. Gait normal.  Skin: Skin is warm and dry. He is not diaphoretic. No erythema.  Psychiatric: Mood, memory, affect and judgment normal.  Nursing note and vitals reviewed.     Lab Results:  CBC No results found for: WBC, RBC, HGB, HCT, PLT, MCV, MCH, MCHC, RDW, LYMPHSABS, MONOABS, EOSABS, BASOSABS  BMET    Component Value Date/Time   NA 142 09/12/2017 1142   NA 145 (H) 08/19/2017 0848   K 3.5 09/12/2017 1142   CL 105 09/12/2017 1142   CO2 27 09/12/2017 1142   GLUCOSE 144 (H) 09/12/2017 1142   BUN 22 09/12/2017 1142   BUN 24 08/19/2017 0848   CREATININE 1.76 (H) 09/12/2017 1142   CALCIUM 8.7 (L)  09/12/2017 1142   GFRNONAA 38 (L) 09/12/2017 1142   GFRAA 44 (L) 09/12/2017 1142    BNP    Component Value Date/Time   BNP 1,943.8 (H) 09/02/2017 1127    ProBNP No results found for: PROBNP  Imaging: No results found.   Assessment & Plan:   Pleasant 67 year old patient seen for office visit today.  This is a very stable interval for Douglas Villa.  Patient is doing quite well.  Patient needs to resume using CPAP.  Discussed that with the patient he agrees.  We will have patient follow-up in 6 weeks to see Dr. Halford Chessman.  Patient to continue follow-up with cardiology as well as  heart failure clinic.  GOLD COPD II B Continue Anoro Ellipta  >>> Take 1 puff daily in the morning right when you wake up >>>Rinse your mouth out after use >>>This is a daily maintenance inhaler, NOT a rescue inhaler >>>Contact our office if you are having difficulties affording or obtaining this medication >>>It is important for you to be able to take this daily and not miss any doses   >>> coupon, samples, and patient assitance provided today   FINANCIAL RESOURCES:  [place] [phone]   Patient Financial Assistance [phone]    Obstructive sleep apnea  We recommend that you continue using your CPAP daily >>>Keep up the hard work using your device >>> Goal should be wearing this for the entire night that you are sleeping, at least 4 to 6 hours  Remember:  . Do not drive or operate heavy machinery if tired or drowsy.  . Please notify the supply company and office if you are unable to use your device regularly due to missing supplies or machine being broken.  . Work on maintaining a healthy weight and following your recommended nutrition plan  . Maintain proper daily exercise and movement  . Maintaining proper use of your device can also help improve management of other chronic illnesses such as: Blood pressure, blood sugars, and weight management.    FINANCIAL  RESOURCES:  [place] [phone]   Patient Financial Assistance [phone]   Follow up with Dr. Halford Chessman in 6 weeks  >>> Bring CPAP with you   Acute on chronic systolic heart failure ([place])  Keep follow up with HF team - 09/19/17 >>>complete BMET lab work   Keep follow Cards - SEPT/2019  Continue low-sodium diet Continue with daily weights   FINANCIAL RESOURCES:  [place] [phone]   Patient Financial Assistance [phone]   Ex-smoker Continue non-smoking  Healthcare maintenance Instructed patient to present to health department to receive pneumonia vaccines.  Patient needs Prevnar 13 in a year later will need Pneumovax 23  Patient will also need flu vaccines when they are available  Continue low-sodium diet Continue with daily weights     Lauraine Rinne, NP 09/16/2017

## 2017-09-16 ENCOUNTER — Encounter (HOSPITAL_COMMUNITY): Payer: Self-pay | Admitting: Cardiology

## 2017-09-16 ENCOUNTER — Encounter: Payer: Self-pay | Admitting: Pulmonary Disease

## 2017-09-16 ENCOUNTER — Ambulatory Visit (INDEPENDENT_AMBULATORY_CARE_PROVIDER_SITE_OTHER): Payer: Self-pay | Admitting: Pulmonary Disease

## 2017-09-16 VITALS — BP 126/74 | HR 80 | Ht 65.0 in | Wt 166.2 lb

## 2017-09-16 DIAGNOSIS — Z0000 Encounter for general adult medical examination without abnormal findings: Secondary | ICD-10-CM

## 2017-09-16 DIAGNOSIS — Z87891 Personal history of nicotine dependence: Secondary | ICD-10-CM

## 2017-09-16 DIAGNOSIS — I5023 Acute on chronic systolic (congestive) heart failure: Secondary | ICD-10-CM

## 2017-09-16 DIAGNOSIS — G4733 Obstructive sleep apnea (adult) (pediatric): Secondary | ICD-10-CM

## 2017-09-16 DIAGNOSIS — J449 Chronic obstructive pulmonary disease, unspecified: Secondary | ICD-10-CM

## 2017-09-16 MED ORDER — UMECLIDINIUM-VILANTEROL 62.5-25 MCG/INH IN AEPB: 1.0000 | INHALATION_SPRAY | Freq: Every day | RESPIRATORY_TRACT | 5 refills | Status: DC

## 2017-09-16 NOTE — Assessment & Plan Note (Addendum)
  Keep follow up with HF team - 09/19/17 >>>complete BMET lab work   Keep follow Cards - SEPT/2019  Continue low-sodium diet Continue with daily weights   FINANCIAL RESOURCES:  [place] [phone]   Patient Financial Assistance [phone]

## 2017-09-16 NOTE — Assessment & Plan Note (Signed)
Instructed patient to present to health department to receive pneumonia vaccines.  Patient needs Prevnar 13 in a year later will need Pneumovax 23  Patient will also need flu vaccines when they are available  Continue low-sodium diet Continue with daily weights

## 2017-09-16 NOTE — Progress Notes (Signed)
Reviewed and agree with assessment/plan.   Tymel Conely, MD [facility] 01/28/2016, 12:24 PM Pager:  [phone]  

## 2017-09-16 NOTE — Assessment & Plan Note (Signed)
Continue Anoro Ellipta  >>> Take 1 puff daily in the morning right when you wake up >>>Rinse your mouth out after use >>>This is a daily maintenance inhaler, NOT a rescue inhaler >>>Contact our office if you are having difficulties affording or obtaining this medication >>>It is important for you to be able to take this daily and not miss any doses   >>> coupon, samples, and patient assitance provided today   FINANCIAL RESOURCES:  [place] [phone]   Patient Financial Assistance [phone]

## 2017-09-16 NOTE — Patient Instructions (Addendum)
Continue Anoro Ellipta  >>> Take 1 puff daily in the morning right when you wake up >>>Rinse your mouth out after use >>>This is a daily maintenance inhaler, NOT a rescue inhaler >>>Contact our office if you are having difficulties affording or obtaining this medication >>>It is important for you to be able to take this daily and not miss any doses   >>> coupon, samples, and patient assitance provided today    We recommend that you continue using your CPAP daily >>>Keep up the hard work using your device >>> Goal should be wearing this for the entire night that you are sleeping, at least 4 to 6 hours  Remember:  . Do not drive or operate heavy machinery if tired or drowsy.  . Please notify the supply company and office if you are unable to use your device regularly due to missing supplies or machine being broken.  . Work on maintaining a healthy weight and following your recommended nutrition plan  . Maintain proper daily exercise and movement  . Maintaining proper use of your device can also help improve management of other chronic illnesses such as: Blood pressure, blood sugars, and weight management.   BiPAP/ CPAP Cleaning:  >>>Clean weekly, with Dawn soap, and bottle brush.  Set up to air dry.    Keep follow up with HF team - 09/19/17 >>>complete BMET lab work   Keep follow Cards - SEPT/2019   FINANCIAL RESOURCES:  [place] [phone]   Patient Financial Assistance [phone]   Follow up with Dr. Halford Chessman in 6 weeks  >>> Bring CPAP with you    Please contact the office if your symptoms worsen or you have concerns that you are not improving.   Thank you for choosing [facility] for your healthcare, and for allowing [place] to partner with you on your healthcare journey. I am thankful to be able to provide care to you today.   Wyn Quaker FNP-C

## 2017-09-16 NOTE — Assessment & Plan Note (Signed)
  We recommend that you continue using your CPAP daily >>>Keep up the hard work using your device >>> Goal should be wearing this for the entire night that you are sleeping, at least 4 to 6 hours  Remember:  . Do not drive or operate heavy machinery if tired or drowsy.  . Please notify the supply company and office if you are unable to use your device regularly due to missing supplies or machine being broken.  . Work on maintaining a healthy weight and following your recommended nutrition plan  . Maintain proper daily exercise and movement  . Maintaining proper use of your device can also help improve management of other chronic illnesses such as: Blood pressure, blood sugars, and weight management.    FINANCIAL RESOURCES:  [place] [phone]   Patient Financial Assistance [phone]   Follow up with Dr. Halford Chessman in 6 weeks  >>> Bring CPAP with you

## 2017-09-16 NOTE — Assessment & Plan Note (Signed)
Continue non-smoking

## 2017-09-19 ENCOUNTER — Ambulatory Visit (HOSPITAL_COMMUNITY)
Admission: RE | Admit: 2017-09-19 | Discharge: 2017-09-19 | Disposition: A | Discharge status: Home / Self Care | Payer: Self-pay | Source: Ambulatory Visit | Attending: Internal Medicine | Admitting: Internal Medicine

## 2017-09-19 ENCOUNTER — Telehealth: Payer: Self-pay | Admitting: Pulmonary Disease

## 2017-09-19 DIAGNOSIS — I5022 Chronic systolic (congestive) heart failure: Secondary | ICD-10-CM | POA: Insufficient documentation

## 2017-09-19 LAB — BASIC METABOLIC PANEL
Anion gap: 9 (ref 5–15)
BUN: 35 mg/dL — ABNORMAL HIGH (ref 8–23)
CHLORIDE: 104 mmol/L (ref 98–111)
CO2: 26 mmol/L (ref 22–32)
Calcium: 9.1 mg/dL (ref 8.9–10.3)
Creatinine, Ser: 2.11 mg/dL — ABNORMAL HIGH (ref 0.61–1.24)
GFR calc Af Amer: 35 mL/min — ABNORMAL LOW (ref >60)
GFR, EST NON AFRICAN AMERICAN: 30 mL/min — AB (ref >60)
Glucose, Bld: 107 mg/dL — ABNORMAL HIGH (ref 70–99)
POTASSIUM: 4.1 mmol/L (ref 3.5–5.1)
SODIUM: 139 mmol/L (ref 135–145)

## 2017-09-19 NOTE — Telephone Encounter (Signed)
cpap download printed and placed in VS's cubby- pt notes that he has only worn the cpap machine the past 2 nights.  Pt also wants to know if his pressure can be adjusted.  Pt is not set up through a DME company- pt has this cpap through his previous employer.  Pt also notes that he does not have health insurance, so he would prefer to keep same machine and avoid a new sleep study if possible.  VS please advise.  Thanks.

## 2017-09-21 NOTE — Telephone Encounter (Signed)
Attempted to call pt. I did not receive an answer. I have left a message for pt to return our call.  

## 2017-09-21 NOTE — Telephone Encounter (Signed)
Auto CPAP 03/23/17 to 09/18/17 >> used on 34 of 180 nights with average 4 hrs 44 min.  Average AHI 12.1 with mean CPAP 8 and 90 th percentile CPAP 10 cm H2O.    Please let him know his CPAP download shows reasonable control of sleep apnea with current settings.  Please find out why he hasn't applied for medicare.  Since he is over 67 years old, he should be eligible.

## 2017-09-22 NOTE — Telephone Encounter (Signed)
Spoke with pt. He is aware of his download report. States that he has Medicare part A but not part B or D. Pt is going to pursue getting these added. Nothing further was needed.

## 2017-09-30 ENCOUNTER — Encounter: Payer: Self-pay | Admitting: Internal Medicine

## 2017-09-30 ENCOUNTER — Ambulatory Visit (INDEPENDENT_AMBULATORY_CARE_PROVIDER_SITE_OTHER): Payer: Self-pay | Admitting: Internal Medicine

## 2017-09-30 VITALS — BP 128/88 | HR 68 | Ht 65.0 in | Wt 164.4 lb

## 2017-09-30 DIAGNOSIS — I5022 Chronic systolic (congestive) heart failure: Secondary | ICD-10-CM

## 2017-09-30 DIAGNOSIS — I493 Ventricular premature depolarization: Secondary | ICD-10-CM

## 2017-09-30 NOTE — Patient Instructions (Addendum)
Medication Instructions:  Your physician recommends that you continue on your current medications as directed. Please refer to the Current Medication list given to you today.  * If you need a refill on your cardiac medications before your next appointment, please call your pharmacy.   Labwork: None ordered  Testing/Procedures: Your physician has recommended that you wear a 24 hour holter monitor. Holter monitors are medical devices that record the heart's electrical activity. Doctors most often use these monitors to diagnose arrhythmias. Arrhythmias are problems with the speed or rhythm of the heartbeat. The monitor is a small, portable device. You can wear one while you do your normal daily activities. This is usually used to diagnose what is causing palpitations/syncope (passing out).    Follow-Up: To be determined  Keep your scheduled follow up appointments, next month,  with the heart failure clinic.    Thank you for choosing [facility]!!     Any Other Special Instructions Will Be Listed Below (If Applicable).   Cardioverter Defibrillator Implantation An implantable cardioverter defibrillator (ICD) is a small device that is placed under the skin in the chest or abdomen. An ICD consists of a battery, a small computer (pulse generator), and wires (leads) that go into the heart. An ICD is used to detect and correct two types of dangerous irregular heartbeats (arrhythmias):  A rapid heart rhythm (tachycardia).  An arrhythmia in which the lower chambers of the heart (ventricles) contract in an uncoordinated way (fibrillation).  When an ICD detects tachycardia, it sends a low-energy shock to the heart to restore the heartbeat to normal (cardioversion). This signal is usually painless. If cardioversion does not work or if the ICD detects fibrillation, it delivers a high-energy shock to the heart (defibrillation) to restart the heart. This shock may feel like a strong jolt in the  chest. Your health care provider may prescribe an ICD if:  You have had an arrhythmia that originated in the ventricles.  Your heart has been damaged by a disease or heart condition.  Sometimes, ICDs are programmed to act as a device called a pacemaker. Pacemakers can be used to treat a slow heartbeat (bradycardia) or tachycardia by taking over the heart rate with electrical impulses. Tell a health care provider about:  Any allergies you have.  All medicines you are taking, including vitamins, herbs, eye drops, creams, and over-the-counter medicines.  Any problems you or family members have had with anesthetic medicines.  Any blood disorders you have.  Any surgeries you have had.  Any medical conditions you have.  Whether you are pregnant or may be pregnant. What are the risks? Generally, this is a safe procedure. However, problems may occur, including:  Swelling, bleeding, or bruising.  Infection.  Blood clots.  Damage to other structures or organs, such as nerves, blood vessels, or the heart.  Allergic reactions to medicines used during the procedure.  What happens before the procedure? Staying hydrated Follow instructions from your health care provider about hydration, which may include:  Up to 2 hours before the procedure - you may continue to drink clear liquids, such as water, clear fruit juice, black coffee, and plain tea.  Eating and drinking restrictions Follow instructions from your health care provider about eating and drinking, which may include:  8 hours before the procedure - stop eating heavy meals or foods such as meat, fried foods, or fatty foods.  6 hours before the procedure - stop eating light meals or foods, such as toast or cereal.  6 hours before the procedure - stop drinking milk or drinks that contain milk.  2 hours before the procedure - stop drinking clear liquids.  Medicine Ask your health care provider about:  Changing or stopping  your normal medicines. This is important if you take diabetes medicines or blood thinners.  Taking medicines such as aspirin and ibuprofen. These medicines can thin your blood. Do not take these medicines before your procedure if your doctor tells you not to.  Tests  You may have blood tests.  You may have a test to check the electrical signals in your heart (electrocardiogram, ECG).  You may have imaging tests, such as a chest X-ray. General instructions  For 24 hours before the procedure, stop using products that contain nicotine or tobacco, such as cigarettes and e-cigarettes. If you need help quitting, ask your health care provider.  Plan to have someone take you home from the hospital or clinic.  You may be asked to shower with a germ-killing soap. What happens during the procedure?  To reduce your risk of infection: ? Your health care team will wash or sanitize their hands. ? Your skin will be washed with soap. ? Hair may be removed from the surgical area.  Small monitors will be put on your body. They will be used to check your heart, blood pressure, and oxygen level.  An IV tube will be inserted into one of your veins.  You will be given one or more of the following: ? A medicine to help you relax (sedative). ? A medicine to numb the area (local anesthetic). ? A medicine to make you fall asleep (general anesthetic).  Leads will be guided through a blood vessel into your heart and attached to your heart muscles. Depending on the ICD, the leads may go into one ventricle or they may go into both ventricles and into an upper chamber of the heart. An X-ray machine (fluoroscope) will be usedto help guide the leads.  A small incision will be made to create a deep pocket under your skin.  The pulse generator will be placed into the pocket.  The ICD will be tested.  The incision will be closed with stitches (sutures), skin glue, or staples.  A bandage (dressing) will be  placed over the incision. This procedure may vary among health care providers and hospitals. What happens after the procedure?  Your blood pressure, heart rate, breathing rate, and blood oxygen level will be monitored often until the medicines you were given have worn off.  A chest X-ray will be taken to check that the ICD is in the right place.  You will need to stay in the hospital for 1-2 days so your health care provider can make sure your ICD is working.  Do not drive for 24 hours if you received a sedative. Ask your health care provider when it is safe for you to drive.  You may be given an identification card explaining that you have an ICD. Summary  An implantable cardioverter defibrillator (ICD) is a small device that is placed under the skin in the chest or abdomen. It is used to detect and correct dangerous irregular heartbeats (arrhythmias).  An ICD consists of a battery, a small computer (pulse generator), and wires (leads) that go into the heart.  When an ICD detects rapid heart rhythm (tachycardia), it sends a low-energy shock to the heart to restore the heartbeat to normal (cardioversion). If cardioversion does not work or if the ICD  detects uncoordinated heart contractions (fibrillation), it delivers a high-energy shock to the heart (defibrillation) to restart the heart.  You will need to stay in the hospital for 1-2 days to make sure your ICD is working. This information is not intended to replace advice given to you by your health care provider. Make sure you discuss any questions you have with your health care provider. Document Released: 10/10/2001 Document Revised: 01/28/2016 Document Reviewed: 01/28/2016 Elsevier Interactive Patient Education  2017 [employer]      Subcutaneous Cardioverter Defibrillator Implantation A subcutaneous implantable cardioverter defibrillator (S-ICD) is a device that identifies and corrects abnormal heart rhythms. Subcutaneous  cardioverter defibrillator implantation is a surgery to place an S-ICD under the skin in the chest. An S-ICD has a battery, a small computer (pulse generator), and a device that moves electrical currents (electrode). The S-ICD detects and corrects two types of dangerous irregular heartbeats (arrhythmias):  A rapid heart rhythm (ventricular tachycardia).  The lower chambers of the heart (ventricles) contracting in an uncoordinated way (ventricular fibrillation).  When the S-ICD detects tachycardia or fibrillation, it sends a shock to the heart that attempts to restore the heartbeat to normal (defibrillation). The shock may feel like a strong jolt in the chest. Your health care provider may recommend S-ICD implantation if:  You had an arrhythmia that started in the ventricles.  Your heart has damage from disease or a heart condition.  Your heart muscle is weak.  There is another type of ICD called the traditional ICD (transvenous ICD) that you and your health care provider may consider. With a transvenous ICD, electrodes are placed through a large blood vessel and into the heart wall, and the shock is delivered directly to the heart tissue. This is different from an S-ICD. With an S-ICD, electrodes do not touch the blood vessels or heart, and the shock is absorbed through the chest. Your health care provider may recommend implantation of an S-ICD instead of a traditional ICD if:  You have vascular disease (blood vessel disorder).  You have medical conditions that increase your risk for infection.  You do not need a pacemaker.  Talk with your health care provider about the benefits of S-ICD. Tell a health care provider about:  Any allergies you have.  All medicines you are taking, including vitamins, herbs, eye drops, creams, and over-the-counter medicines.  Any problems you or family members have had with anesthetic medicines.  Any blood disorders you have.  Any surgeries you have  had.  Any medical conditions you have.  Whether you are pregnant or may be pregnant. What are the risks? Generally, this is a safe procedure. However, problems may occur, including:  Infection.  Bleeding.  Allergic reactions to medicines or dyes.  Failure to shock or correct the arrhythmia.  Swelling and bruising.  Blood clots.  What happens before the procedure? Staying hydrated Follow instructions from your health care provider about hydration.  Up to 2 hours before the procedure - you may continue to drink clear liquids, such as water, clear fruit juice, black coffee, and plain tea.  Eating and drinking restrictions Follow instructions from your health care provider about eating and drinking, which may include:  8 hours before the procedure - stop eating heavy meals or foods such as meat, fried foods, or fatty foods.  6 hours before the procedure - stop eating light meals or foods, such as toast or cereal.  6 hours before the procedure - stop drinking milk or drinks that  contain milk.  2 hours before the procedure - stop drinking clear liquids.  Medicines  Ask your health care provider about: ? Changing or stopping your regular medicines. This is especially important if you are taking diabetes medicines or blood thinners. ? Taking over-the-counter medicines, vitamins, herbs, and supplements. ? Taking medicines such as aspirin and ibuprofen. These medicines can thin your blood. Do not take these medicines unless your health care provider tells you to take them.  You may be prescribed antibiotic medicine to take before the procedure to help prevent infection. If so, take the antibiotic as told by your health care provider. Do not stop taking the antibiotic even if you start to feel better. General instructions  Do not use any products that contain nicotine or tobacco, such as cigarettes and e-cigarettes. If you need help quitting, ask your health care provider.  You  may have blood tests.  You may have a test to check the electrical signals in your heart (electrocardiogram, ECG).  You may have imaging tests, such as a chest X-ray.  You may be asked to shower with a germ-killing soap.  Plan to have a responsible adult care for you for at least 24 hours after you leave the hospital or clinic. This is important. What happens during the procedure?  To lower your risk of infection: ? Your health care team will wash or sanitize their hands. ? Your skin will be washed with soap. ? Hair may be removed from the surgical area.  An IV will be inserted into one of your veins.  You will be given one or more of the following: ? A medicine to help you relax (sedative). ? A medicine to make you fall asleep (general anesthetic).  A small incision will be made on the left side of your chest near your rib cage, under your arm.  A pocket (pouch) will be made in the skin on the lower part of your chest. The S-ICD pulse generator will be inserted into this pocket.  An electrode will be placed under the skin in your chest, near your breastbone (sternum).  The electrode will be attached to the S-ICD pulse generator.  The S-ICD will be tested to make sure it is working.  Your incision will be closed with stitches (sutures).  A bandage (dressing) may be placed over your incision. The procedure may vary among health care providers and hospitals. What happens after the procedure?  Your blood pressure, heart rate, breathing rate, and blood oxygen level will be monitored until the medicines you were given have worn off.  You will be given pain medicine as needed.  You may have a chest X-ray to check the S-ICD.  You will get an identification card explaining that you have an S-ICD. You will need to keep this card with you at all times.  Do not drive until your heart care provider approves. Summary  A subcutaneous cardioverter defibrillator implantation is a  type of surgery to implant a device that corrects dangerous irregular heartbeats (arrhythmias).  During the surgery, a subcutaneous implantable cardioverter defibrillator (S-ICD) is placed under the skin in the chest.  The S-ICD electrodes rest in the chest, but they do not directly touch the heart or blood vessels. This information is not intended to replace advice given to you by your health care provider. Make sure you discuss any questions you have with your health care provider. Document Released: 04/22/2016 Document Revised: 04/22/2016 Document Reviewed: 04/22/2016 Elsevier Interactive Patient Education  2018 Elsevier Inc.  

## 2017-09-30 NOTE — Progress Notes (Signed)
ELECTROPHYSIOLOGY CONSULT NOTE  Patient ID: Douglas Villa, MRN: [id number], DOB/AGE: [birth year] 67 y.o. Admit date: (Not on file) Date of Consult: 09/30/2017  Primary Physician: Lillard Anes, MD Primary Cardiologist: DM     Douglas Villa is a 67 y.o. male who is being seen today for the evaluation of ICD at the request of DM .    HPI Douglas Villa is a 67 y.o. 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  He was hospitalized.  Catheterization demonstrated two-vessel coronary disease and he underwent stenting of his circumflex.  LV dysfunction was thought to be out of proportion To his coronary disease.  He had significant problems with volume overload and has undergone a 40 pound interval diuresis.  Following his first visit with Dr. DM, medication adjustments resulted in marked improvement in exercise tolerance.  He is now able to walk up and down the hill in his driveway without dyspnea.  He has no nocturnal dyspnea orthopnea.  Does have some peripheral edema which is mostly scant  No chest pain.  No history of syncope.  No palpitations.  DATE TEST EF   3/19 LHC  25-35 % PLV occ Cx 80%>>DES  6/19 Echo   25-30 %  MR moderate-severe TR moderate-severe        Date Cr K Hgb  3/19   12.5  8/19 2.11 4.1              Past Medical History:  Diagnosis Date  . Abnormal EKG 03/2009  . CAD (coronary artery disease)   . CKD (chronic kidney disease)   . Heart failure with preserved left ventricular function (HFpEF) ([place])   . Hypertension   . OSA (obstructive sleep apnea)   . Sexual dysfunction       Surgical History:  Past Surgical History:  Procedure Laterality Date  . APPENDECTOMY  1969  . CARDIAC CATHETERIZATION    . CORONARY ANGIOPLASTY       Home Meds: Prior to Admission medications   Medication Sig Start Date End Date Taking? Authorizing  Provider  ACCU-CHEK FASTCLIX LANCETS MISC by Does not apply route.   Yes [provider]  albuterol (PROVENTIL HFA;VENTOLIN HFA) 108 (90 Base) MCG/ACT inhaler Inhale 2 puffs into the lungs every 6 (six) hours as needed for wheezing or shortness of breath. 08/05/17  Yes Lauraine Rinne, NP  aspirin 81 MG tablet Take 1 tablet (81 mg total) by mouth daily. 09/02/17  Yes Larey Dresser, MD  atorvastatin (LIPITOR) 80 MG tablet Take 1 tablet (80 mg total) by mouth daily. 09/02/17  Yes Larey Dresser, MD  Blood Glucose Monitoring Suppl (ACCU-CHEK AVIVA PLUS) w/Device KIT by Does not apply route.   Yes [provider]  carvedilol (COREG) 6.25 MG tablet Take 1 tablet (6.25 mg total) by mouth daily. 09/02/17  Yes Larey Dresser, MD  clopidogrel (PLAVIX) 75 MG tablet Take 1 tablet (75 mg total) by mouth daily. 09/02/17  Yes Larey Dresser, MD  glucose blood test strip 1 each by Other route as needed. Use as instructed   Yes [provider]  hydrALAZINE (APRESOLINE) 25 MG tablet Take 25 mg by mouth. 50 mg in the AM and 25 mg in the PM   Yes [provider]  isosorbide mononitrate (IMDUR) 30 MG 24 hr tablet Take 1 tablet (30 mg total) by  mouth daily. 09/02/17 12/01/17 Yes Larey Dresser, MD  Multiple Vitamin (MULTIVITAMIN) capsule Take 1 capsule by mouth daily.     Yes [provider]  nitroGLYCERIN (NITROSTAT) 0.4 MG SL tablet Place 0.4 mg under the tongue every 5 (five) minutes as needed for chest pain.   Yes [provider]  potassium chloride SA (K-DUR,KLOR-CON) 20 MEQ tablet Take 4 tablets (80 mEq total) by mouth daily. 07/11/17  Yes Revankar, Reita Cliche, MD  spironolactone (ALDACTONE) 25 MG tablet Take 0.5 tablets (12.5 mg total) by mouth daily. 09/12/17 12/11/17 Yes Georgiana Shore, NP  torsemide (DEMADEX) 20 MG tablet Take 4 tablets (80 mg total) by mouth 2 (two) times daily. 09/02/17 12/01/17 Yes Larey Dresser, MD  umeclidinium-vilanterol [facility] ELLIPTA)  62.5-25 MCG/INH AEPB Inhale 1 puff into the lungs daily. 09/16/17  Yes Lauraine Rinne, NP    Allergies:  Allergies  Allergen Reactions  . Penicillins     Sweating     Social History   Socioeconomic History  . Marital status: Single    Spouse name: Not on file  . Number of children: Not on file  . Years of education: Not on file  . Highest education level: Not on file  Occupational History  . Not on file  Social Needs  . Financial resource strain: Not on file  . Food insecurity:    Worry: Not on file    Inability: Not on file  . Transportation needs:    Medical: Not on file    Non-medical: Not on file  Tobacco Use  . Smoking status: Former Smoker    Packs/day: 1.00    Years: 45.00    Pack years: 45.00    Types: Cigarettes    Last attempt to quit: 03/31/2017    Years since quitting: 0.5  . Smokeless tobacco: Never Used  Substance and Sexual Activity  . Alcohol use: No  . Drug use: No  . Sexual activity: Not on file  Lifestyle  . Physical activity:    Days per week: Not on file    Minutes per session: Not on file  . Stress: Not on file  Relationships  . Social connections:    Talks on phone: Not on file    Gets together: Not on file    Attends religious service: Not on file    Active member of club or organization: Not on file    Attends meetings of clubs or organizations: Not on file    Relationship status: Not on file  . Intimate partner violence:    Fear of current or ex partner: Not on file    Emotionally abused: Not on file    Physically abused: Not on file    Forced sexual activity: Not on file  Other Topics Concern  . Not on file  Social History Narrative  . Not on file     Family History  Problem Relation Age of Onset  . Cancer Mother 25  . Stroke Father 60     ROS:  Please see the history of present illness.     All other systems reviewed and negative.    Physical Exam:  Blood pressure 128/88, pulse 68, height '5\' 5"'$  (1.651 m), weight 164  lb 6.4 oz (74.6 kg). General: Well developed, well nourished male in no acute distress. Head: Normocephalic, atraumatic, sclera non-icteric, no xanthomas, nares are without discharge. EENT: normal  Lymph Nodes:  none Neck: Negative for carotid bruits. JVD not elevated.  Back:without scoliosis kyphosis  Lungs: Clear bilaterally to auscultation without wheezes, rales, or rhonchi. Breathing is unlabored. Heart: irregular RR with S1 S2.  2/6 systolic  murmur . No rubs, or gallops appreciated. Abdomen: Soft, non-tender, non-distended with normoactive bowel sounds. No hepatomegaly. No rebound/guarding. No obvious abdominal masses. Msk:  Strength and tone appear normal for age. Extremities: No clubbing or cyanosis. No  edema.  Distal pedal pulses are 2+ and equal bilaterally. Skin: Warm and Dry Neuro: Alert and oriented X 3. CN III-XII intact Grossly normal sensory and motor function . Psych:  Responds to questions appropriately with a normal affect.      Labs: Cardiac Enzymes No results for input(s): CKTOTAL, CKMB, TROPONINI in the last 72 hours. CBC No results found for: WBC, HGB, HCT, MCV, PLT PROTIME: No results for input(s): LABPROT, INR in the last 72 hours. Chemistry No results for input(s): NA, K, CL, CO2, BUN, CREATININE, CALCIUM, PROT, BILITOT, ALKPHOS, ALT, AST, GLUCOSE in the last 168 hours.  Invalid input(s): LABALBU Lipids Lab Results  Component Value Date   CHOL 110 08/19/2017   HDL 44 08/19/2017   LDLCALC 52 08/19/2017   TRIG 68 08/19/2017   BNP No results found for: PROBNP Thyroid Function Tests: No results for input(s): TSH, T4TOTAL, T3FREE, THYROIDAB in the last 72 hours.  Invalid input(s): FREET3 Miscellaneous No results found for: DDIMER  Radiology/Studies:  No results found.  EKG: Sinus at 68 Intervals 20/11/45 Axis XX 9 Low voltage PVC isolated   Assessment and Plan:  Cardiomyopathy ischemic/nonischemic  Coronary artery disease status post  stenting  PVC  CHF class 2b  Chronic kidney disease class 3  GFR > 34   2 out of 3 ECGs demonstrated isolated PVC.  Furthermore, ECG from [facility] 6/19 also reports PVCs.  It is worth quantitating PVC burden to see if these could potentially be contributing to his nonischemic cardiomyopathy.  We will do Holter.    In the event that there is a significant burden, elimination of PVCs prior to making a decision regarding ICD implantation would be appropriate given the nonischemic component of his cardiomyopathy  He is appropriately considered for an ICD given his mixed cardiomyopathy.  The [place] trial would suggest that the benefits are reduced to the degree that this is nonischemic.   We have discussed the relative benefits and merits of transvenous versus subcutaneous ICD implantation.  The advantages of the former including battery longevity, history derived from use in randomized controlled trials and perhaps a somewhat lower rate of inappropriate ICD discharges. Advantages of the latter  include the fact that it is extravascular,  Resulting different implications of device infection and it being non-transvalvular.    He would like to think about this and discussed with Dr. Aundra Dubin prior to a final decision   Virl Axe

## 2017-10-05 ENCOUNTER — Ambulatory Visit (INDEPENDENT_AMBULATORY_CARE_PROVIDER_SITE_OTHER): Payer: Self-pay

## 2017-10-05 ENCOUNTER — Encounter (HOSPITAL_COMMUNITY): Payer: Self-pay

## 2017-10-05 DIAGNOSIS — I493 Ventricular premature depolarization: Secondary | ICD-10-CM

## 2017-10-06 NOTE — Progress Notes (Signed)
Advanced Heart Failure Clinic Note   PCP: Dr. Henrene Pastor Cardiology: Dr. Geraldo Pitter HF Cardiology: Dr. Vivi Ferns is a 67 y.o. male with history of CAD and cardiomyopathy referred by Dr. Geraldo Pitter for evaluation of CHF.  Patient has had a history of HTN and smoking (quit smoking in 2/19) with COPD.  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  He had had one prior episode of CHF in 2016 where he was admitted at [facility] (no records available).  At the hospital in [place], he had LHC showed occluded PLV and 80% stenosis proximal LCx.  EF was 25-35% by LV-gram.  He had DES placed to pLCx.  He was not talking clopidogrel or ticagrelor.  He does not think he ever got it after his stent was placed.   Echo was done in 6/19.  This showed EF 25-30% with moderate-severe MR, moderate-severe TR.    He presents today for regular follow up. Last visit spiro was added. Overall doing well. Had a few missed medications over the last few days due to dealing with family issues, but plans to get back on track. Denies SOB. Stays busy taking care of sister and niece, but not much formal exercise. No orthopnea, PND, or edema. No dizziness or CP. Wearing CPAP qHS. No bleeding on ASA/plavix. Not weighing at home, but weight is down 7 lbs on our scale.   Labs (7/19): LDL 52, HDL 44, K 3.5, creatinine 2  ECG (personally reviewed): NSR, 1st degree AVB, nonspecific TW flattening, QRS 114 msec  PMH: 1. HTN 2. CKD stage 3 3. COPD: Quit smoking in 2/19.  4. OSA 5. Hyperlipidemia 6. 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   - LHC (3/19): Done in [place].  Totally occluded PLV, 80% pLCx stenosis => DES to LCx.  LV-gram with EF 25-35%. - Echo (6/19): EF 25-30%, grade III diastolic dysfunction, moderate-severe MR, moderate-severe TR, PASP 57 mmHg.   SH: Lives alone in  [place], [place].  Quit smoking in 2/19. Rare ETOH.   Review of systems complete and found to be negative unless listed in HPI.   Family History  Problem Relation Age of Onset  . Cancer Mother 39  . Stroke Father 99    Current Outpatient Medications  Medication Sig Dispense Refill  . ACCU-CHEK FASTCLIX LANCETS MISC by Does not apply route.    Marland Kitchen albuterol (PROVENTIL HFA;VENTOLIN HFA) 108 (90 Base) MCG/ACT inhaler Inhale 2 puffs into the lungs every 6 (six) hours as needed for wheezing or shortness of breath. 1 Inhaler 2  . aspirin 81 MG tablet Take 1 tablet (81 mg total) by mouth daily. 90 tablet 3  . atorvastatin (LIPITOR) 80 MG tablet Take 1 tablet (80 mg total) by mouth daily. 90 tablet 3  . Blood Glucose Monitoring Suppl (ACCU-CHEK AVIVA PLUS) w/Device KIT by Does not apply route.    . carvedilol (COREG) 6.25 MG tablet Take 1 tablet (6.25 mg total) by mouth daily. 60 tablet 11  . clopidogrel (PLAVIX) 75 MG tablet Take 1 tablet (75 mg total) by mouth daily. 90 tablet 3  . glucose blood test strip 1 each by Other route as needed. Use as instructed    . hydrALAZINE (APRESOLINE) 25 MG tablet Take 25 mg by mouth 3 (three) times daily.    . isosorbide mononitrate (IMDUR) 30 MG 24 hr tablet Take 1 tablet (30  mg total) by mouth daily. 30 tablet 11  . Multiple Vitamin (MULTIVITAMIN) capsule Take 1 capsule by mouth daily.      . nitroGLYCERIN (NITROSTAT) 0.4 MG SL tablet Place 0.4 mg under the tongue every 5 (five) minutes as needed for chest pain.    . potassium chloride SA (K-DUR,KLOR-CON) 20 MEQ tablet Take 4 tablets (80 mEq total) by mouth daily. 90 tablet 3  . spironolactone (ALDACTONE) 25 MG tablet Take 0.5 tablets (12.5 mg total) by mouth daily. 15 tablet 3  . torsemide (DEMADEX) 20 MG tablet Take 4 tablets (80 mg total) by mouth 2 (two) times daily. 120 tablet 11  . umeclidinium-vilanterol (ANORO ELLIPTA) 62.5-25 MCG/INH AEPB Inhale 1 puff into the lungs daily. 60 each 5   No  current facility-administered medications for this encounter.    BP 138/82   Pulse 80   Wt 74.8 kg (165 lb)   SpO2 99%   BMI 27.46 kg/m   Wt Readings from Last 3 Encounters:  10/07/17 74.8 kg (165 lb)  09/30/17 74.6 kg (164 lb 6.4 oz)  09/16/17 75.4 kg (166 lb 3.2 oz)   General: No resp difficulty. HEENT: Normal Neck: Supple. JVP flat. Carotids 2+ bilat; no bruits. No thyromegaly or nodule noted. Cor: PMI nondisplaced. RRR, 2/6 HSM LLSB/apex  Lungs: CTAB, normal effort. Abdomen: Soft, non-tender, non-distended, no HSM. No bruits or masses. +BS  Extremities: No cyanosis, clubbing, or rash. R and LLE no edema.  Neuro: Alert & orientedx3, cranial nerves grossly intact. moves all 4 extremities w/o difficulty. Affect pleasant  ReDS clip: 28% (42% last time)  Assessment/Plan: 1. Chronic systolic CHF: Echo 9/76 with EF 25-30%.  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  LV dysfunction is out of proportion to degree of coronary disease.   - Improved NYHA class II symptoms.  - Volume status dry on exam.  - Decrease torsemide to 80 mg daily. Okay to take an extra 40 mg PRN. BMET today.  - Continue Coreg 6.25 mg bid  - Continue hydralazine 25 mg TID and Imdur 30 daily. - Continue spiro 12.5 mg daily. - Hold off on ARB/Entresto with creatinine 2.11 - Dr Caryl Comes saw him last week. He noticed that he has frequent PVCs and would like to quantify burden and treat, if indicated, prior to implanting ICD. He has ordered a Holter monitor to quantify PVC burden. - HIV and myeloma panel negative.  - Schedule CPX prior to appointment with Dr Aundra Dubin. 2. CAD: DES to pLCx in 3/19.   - Continue plavix, ASA - Continue atorvastatin 80 mg daily, good lipids in 7/19. - No s/s ischemia 3. CKD: Stage 3.   - BMET today 4. Hypokalemia - K 4.1 on 8/19. Resolved.  5. Frequent PVCs - Dr Caryl Comes has ordered Holter monitor to see if PVCs are contributing to cardiomyopathy. If >10% PVC burden,  would benefit from treatment and seeing if EF increases.  Will not adjust HF meds since he has not taken yet today.   Keep follow up with Dr Aundra Dubin end of September Set up CPX BMET today Clip 28%. Cut torsemide back to 80 mg daily with extra 40 mg PRN  Georgiana Shore, NP 10/07/2017  Greater than 50% of the 25 minute visit was spent in counseling/coordination of care regarding disease state education, salt/fluid restriction, sliding scale diuretics, and medication compliance.

## 2017-10-07 ENCOUNTER — Ambulatory Visit (HOSPITAL_COMMUNITY)
Admission: RE | Admit: 2017-10-07 | Discharge: 2017-10-07 | Disposition: A | Discharge status: Home / Self Care | Payer: Self-pay | Source: Ambulatory Visit | Attending: Cardiology | Admitting: Cardiology

## 2017-10-07 ENCOUNTER — Telehealth (HOSPITAL_COMMUNITY): Payer: Self-pay

## 2017-10-07 VITALS — BP 138/82 | HR 80 | Wt 165.0 lb

## 2017-10-07 DIAGNOSIS — E785 Hyperlipidemia, unspecified: Secondary | ICD-10-CM | POA: Insufficient documentation

## 2017-10-07 DIAGNOSIS — I429 Cardiomyopathy, unspecified: Secondary | ICD-10-CM | POA: Insufficient documentation

## 2017-10-07 DIAGNOSIS — I493 Ventricular premature depolarization: Secondary | ICD-10-CM | POA: Insufficient documentation

## 2017-10-07 DIAGNOSIS — G4733 Obstructive sleep apnea (adult) (pediatric): Secondary | ICD-10-CM | POA: Insufficient documentation

## 2017-10-07 DIAGNOSIS — Z7902 Long term (current) use of antithrombotics/antiplatelets: Secondary | ICD-10-CM | POA: Insufficient documentation

## 2017-10-07 DIAGNOSIS — N183 Chronic kidney disease, stage 3 unspecified: Secondary | ICD-10-CM

## 2017-10-07 DIAGNOSIS — Z79899 Other long term (current) drug therapy: Secondary | ICD-10-CM | POA: Insufficient documentation

## 2017-10-07 DIAGNOSIS — I130 Hypertensive heart and chronic kidney disease with heart failure and stage 1 through stage 4 chronic kidney disease, or unspecified chronic kidney disease: Secondary | ICD-10-CM | POA: Insufficient documentation

## 2017-10-07 DIAGNOSIS — E876 Hypokalemia: Secondary | ICD-10-CM | POA: Insufficient documentation

## 2017-10-07 DIAGNOSIS — I5022 Chronic systolic (congestive) heart failure: Secondary | ICD-10-CM | POA: Insufficient documentation

## 2017-10-07 DIAGNOSIS — J449 Chronic obstructive pulmonary disease, unspecified: Secondary | ICD-10-CM | POA: Insufficient documentation

## 2017-10-07 DIAGNOSIS — Z87891 Personal history of nicotine dependence: Secondary | ICD-10-CM | POA: Insufficient documentation

## 2017-10-07 DIAGNOSIS — I2510 Atherosclerotic heart disease of native coronary artery without angina pectoris: Secondary | ICD-10-CM | POA: Insufficient documentation

## 2017-10-07 DIAGNOSIS — Z7982 Long term (current) use of aspirin: Secondary | ICD-10-CM | POA: Insufficient documentation

## 2017-10-07 LAB — BASIC METABOLIC PANEL
Anion gap: 10 (ref 5–15)
BUN: 35 mg/dL — AB (ref 8–23)
CHLORIDE: 103 mmol/L (ref 98–111)
CO2: 27 mmol/L (ref 22–32)
CREATININE: 2.09 mg/dL — AB (ref 0.61–1.24)
Calcium: 9.2 mg/dL (ref 8.9–10.3)
GFR calc Af Amer: 36 mL/min — ABNORMAL LOW (ref >60)
GFR calc non Af Amer: 31 mL/min — ABNORMAL LOW (ref >60)
Glucose, Bld: 251 mg/dL — ABNORMAL HIGH (ref 70–99)
Potassium: 3 mmol/L — ABNORMAL LOW (ref 3.5–5.1)
SODIUM: 140 mmol/L (ref 135–145)

## 2017-10-07 MED ORDER — TORSEMIDE 20 MG PO TABS: 80.0000 mg | ORAL_TABLET | Freq: Every day | ORAL | 11 refills | Status: DC

## 2017-10-07 MED ORDER — POTASSIUM CHLORIDE CRYS ER 20 MEQ PO TBCR: 40.0000 meq | EXTENDED_RELEASE_TABLET | Freq: Two times a day (BID) | ORAL | 11 refills | Status: DC

## 2017-10-07 NOTE — Progress Notes (Signed)
ReDS Vest - 10/07/17 1000      ReDS Vest   Fitting Posture  Sitting    Height Marker  --   station A   Ruler Value  30    ReDS Value  28

## 2017-10-07 NOTE — Patient Instructions (Addendum)
Decrease Torsemide to 80 mg daily, can take an extra 40 mg if needed for swelling  Lab today  Your physician has recommended that you have a cardiopulmonary stress test (CPX). CPX testing is a non-invasive measurement of heart and lung function. It replaces a traditional treadmill stress test. This type of test provides a tremendous amount of information that relates not only to your present condition but also for future outcomes. This test combines measurements of you ventilation, respiratory gas exchange in the lungs, electrocardiogram (EKG), blood pressure and physical response before, during, and following an exercise protocol.  As scheduled with Dr Aundra Dubin in 3 weeks

## 2017-10-07 NOTE — Telephone Encounter (Signed)
Result Notes for Basic metabolic panel   Notes recorded by Effie Berkshire, RN on 10/07/2017 at 3:53 PM EDT Done, lab appt added on to cpx appt in 10 days ------  Notes recorded by Georgiana Shore, NP on 10/07/2017 at 1:45 PM EDT Please call and make sure he is taking Potassium 80 meq daily. Have him take an additional 60 meq today. We decreased torsemide today, so I think he can continue previous dose of potassium, but see if he can split it up to 40 meq BID for better absorption. Recheck BMET 1 week. Thanks

## 2017-10-17 ENCOUNTER — Other Ambulatory Visit (HOSPITAL_COMMUNITY): Payer: Self-pay

## 2017-10-17 ENCOUNTER — Institutional Professional Consult (permissible substitution): Payer: Self-pay | Admitting: Internal Medicine

## 2017-10-19 ENCOUNTER — Other Ambulatory Visit (HOSPITAL_COMMUNITY): Payer: Self-pay | Admitting: *Deleted

## 2017-10-19 ENCOUNTER — Ambulatory Visit (HOSPITAL_COMMUNITY)
Admission: RE | Admit: 2017-10-19 | Discharge: 2017-10-19 | Disposition: A | Discharge status: Home / Self Care | Payer: Self-pay | Source: Ambulatory Visit | Attending: Internal Medicine | Admitting: Internal Medicine

## 2017-10-19 ENCOUNTER — Ambulatory Visit (HOSPITAL_COMMUNITY): Payer: Self-pay

## 2017-10-19 DIAGNOSIS — I5022 Chronic systolic (congestive) heart failure: Secondary | ICD-10-CM

## 2017-10-19 DIAGNOSIS — I5023 Acute on chronic systolic (congestive) heart failure: Secondary | ICD-10-CM | POA: Insufficient documentation

## 2017-10-19 LAB — BASIC METABOLIC PANEL
ANION GAP: 9 (ref 5–15)
BUN: 55 mg/dL — ABNORMAL HIGH (ref 8–23)
CALCIUM: 9.7 mg/dL (ref 8.9–10.3)
CO2: 28 mmol/L (ref 22–32)
Chloride: 104 mmol/L (ref 98–111)
Creatinine, Ser: 2.51 mg/dL — ABNORMAL HIGH (ref 0.61–1.24)
GFR calc Af Amer: 28 mL/min — ABNORMAL LOW (ref >60)
GFR calc non Af Amer: 25 mL/min — ABNORMAL LOW (ref >60)
GLUCOSE: 108 mg/dL — AB (ref 70–99)
Potassium: 4.5 mmol/L (ref 3.5–5.1)
Sodium: 141 mmol/L (ref 135–145)

## 2017-10-25 ENCOUNTER — Telehealth (HOSPITAL_COMMUNITY): Payer: Self-pay | Admitting: Cardiology

## 2017-10-25 MED ORDER — TORSEMIDE 20 MG PO TABS: 60.0000 mg | ORAL_TABLET | Freq: Every day | ORAL | 11 refills | Status: DC

## 2017-10-25 MED ORDER — POTASSIUM CHLORIDE CRYS ER 20 MEQ PO TBCR: EXTENDED_RELEASE_TABLET | ORAL | 11 refills | Status: DC

## 2017-10-25 NOTE — Telephone Encounter (Signed)
Recheck labs at follow up x 1 week    Notes recorded by Kerry Dory, CMA on 10/25/2017 at 2:14 PM EDT Patient aware. Patient voiced understanding.   ------  Notes recorded by Georgiana Shore, NP on 10/25/2017 at 12:19 PM EDT Please try calling again. Have him also hold his potassium when he holds the torsemide. Thhanks ------  Notes recorded by Kerry Dory, CMA on 10/20/2017 at 3:34 PM EDT Left message for patient to call back.  [phone] (H) ------  Notes recorded by Kerry Dory, CMA on 10/19/2017 at 1:45 PM EDT Left message for patient to call back.  [phone] (H) ------  Notes recorded by Georgiana Shore, NP on 10/19/2017 at 11:57 AM EDT Potassium is stable now. Creatinine is up. Please make sure he decreased torsemide to 80 mg daily. Have him told torsemide and spiro for 2 days, then resume torsemide at 60 mg daily and spiro at previous dose 12.5 mg daily. He should also decrease potassium to 40 meq am, 20 meq pm. Need to recheck BMET in 1 week.

## 2017-10-28 ENCOUNTER — Ambulatory Visit (HOSPITAL_COMMUNITY)
Admission: RE | Admit: 2017-10-28 | Discharge: 2017-10-28 | Disposition: A | Discharge status: Home / Self Care | Payer: Self-pay | Source: Ambulatory Visit | Attending: Cardiology | Admitting: Cardiology

## 2017-10-28 VITALS — BP 110/70 | HR 68 | Wt 168.2 lb

## 2017-10-28 DIAGNOSIS — I130 Hypertensive heart and chronic kidney disease with heart failure and stage 1 through stage 4 chronic kidney disease, or unspecified chronic kidney disease: Secondary | ICD-10-CM | POA: Insufficient documentation

## 2017-10-28 DIAGNOSIS — Z79899 Other long term (current) drug therapy: Secondary | ICD-10-CM | POA: Insufficient documentation

## 2017-10-28 DIAGNOSIS — N183 Chronic kidney disease, stage 3 unspecified: Secondary | ICD-10-CM

## 2017-10-28 DIAGNOSIS — G4733 Obstructive sleep apnea (adult) (pediatric): Secondary | ICD-10-CM | POA: Insufficient documentation

## 2017-10-28 DIAGNOSIS — J449 Chronic obstructive pulmonary disease, unspecified: Secondary | ICD-10-CM | POA: Insufficient documentation

## 2017-10-28 DIAGNOSIS — I493 Ventricular premature depolarization: Secondary | ICD-10-CM | POA: Insufficient documentation

## 2017-10-28 DIAGNOSIS — I2510 Atherosclerotic heart disease of native coronary artery without angina pectoris: Secondary | ICD-10-CM | POA: Insufficient documentation

## 2017-10-28 DIAGNOSIS — Z7982 Long term (current) use of aspirin: Secondary | ICD-10-CM | POA: Insufficient documentation

## 2017-10-28 DIAGNOSIS — Z7902 Long term (current) use of antithrombotics/antiplatelets: Secondary | ICD-10-CM | POA: Insufficient documentation

## 2017-10-28 DIAGNOSIS — I5022 Chronic systolic (congestive) heart failure: Secondary | ICD-10-CM | POA: Insufficient documentation

## 2017-10-28 DIAGNOSIS — E785 Hyperlipidemia, unspecified: Secondary | ICD-10-CM | POA: Insufficient documentation

## 2017-10-28 DIAGNOSIS — Z87891 Personal history of nicotine dependence: Secondary | ICD-10-CM | POA: Insufficient documentation

## 2017-10-28 LAB — BASIC METABOLIC PANEL
Anion gap: 9 (ref 5–15)
BUN: 27 mg/dL — AB (ref 8–23)
CHLORIDE: 108 mmol/L (ref 98–111)
CO2: 23 mmol/L (ref 22–32)
Calcium: 9.7 mg/dL (ref 8.9–10.3)
Creatinine, Ser: 1.82 mg/dL — ABNORMAL HIGH (ref 0.61–1.24)
GFR calc Af Amer: 42 mL/min — ABNORMAL LOW (ref >60)
GFR calc non Af Amer: 36 mL/min — ABNORMAL LOW (ref >60)
GLUCOSE: 96 mg/dL (ref 70–99)
POTASSIUM: 5.1 mmol/L (ref 3.5–5.1)
Sodium: 140 mmol/L (ref 135–145)

## 2017-10-28 MED ORDER — CARVEDILOL 6.25 MG PO TABS: 9.3750 mg | ORAL_TABLET | Freq: Two times a day (BID) | ORAL | 6 refills | Status: DC

## 2017-10-28 NOTE — Patient Instructions (Signed)
Increase Carvedilol to 9.375 mg (1 & 1/2 tabs) Twice daily   Take Torsemide 60 mg (3 tabs) DAILY, ALL TOGETHER AT THE SAME TIME  Labs today  Labs in 1 week  You have been referred to [facility], they will contact you to schedule an appointment  Your physician recommends that you schedule a follow-up appointment in: 3 weeks with APP  Your physician recommends that you schedule a follow-up appointment in: 3 months with Dr Aundra Dubin

## 2017-10-29 NOTE — Progress Notes (Signed)
PCP: Dr. Henrene Pastor Cardiology: Dr. Geraldo Pitter HF Cardiology: Dr. Aundra Dubin  67 yo with history of CAD and cardiomyopathy referred by Dr. Geraldo Pitter for evaluation of CHF.  Patient has had a history of HTN and smoking (quit smoking in 2/19) with COPD.  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  He had had one prior episode of CHF in 2016 where he was admitted at [facility] (no records available).  At the hospital in [place], he had LHC showed occluded PLV and 80% stenosis proximal LCx.  EF was 25-35% by LV-gram.  He had DES placed to pLCx.  He is not talking clopidogrel or ticagrelor.  He does not think he ever got it after his stent was placed.   Echo was done in 6/19.  This showed EF 25-30% with moderate-severe MR, moderate-severe TR. He saw Dr. Caryl Comes for ICD consideration, given frequent PVCs a holter was placed in 9/19 => 2% PVCs.  CPX was done in 9/19 showing moderate functional impairment from heart failure.    Patient returns for followup of CHF.  He has been doing well symptomatically.  He is not short of breath walking on flat ground.  He has no problems walking through [employer].  No chest pain. No lightheadedness/syncope.  No palpitations.  Creatinine was up on last labs, he has not taken torsemide for 3 days.    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  Labs (9/19): K 4.5, creatinine 2.5  PMH: 1. HTN 2. CKD stage 3 3. COPD: Quit smoking in 2/19.  4. OSA 5. Hyperlipidemia 6. 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   - LHC (3/19): Done in [place].  Totally occluded PLV, 80% pLCx stenosis => DES to LCx.  LV-gram with EF 25-35%. - Echo (6/19): EF 25-30%, grade III diastolic dysfunction, moderate-severe MR, moderate-severe TR, PASP 57 mmHg.  - CPX (9/19): with peak VO2 17.8, VE/VCO2  slope 38, RER 1.11 => moderate functional impairment.  7. CAD: LHC (3/19): Done in [place].  Totally occluded PLV, 80% pLCx stenosis => DES to LCx.  8. PVCs: Holter 9/19 with 2% PVCs.   SH: Lives alone in [place], [place].  Quit smoking in 2/19. Rare ETOH.   Family History  Problem Relation Age of Onset  . Cancer Mother 47  . Stroke Father 44   ROS: All systems reviewed and negative except as per HPI.  Current Outpatient Medications  Medication Sig Dispense Refill  . ACCU-CHEK FASTCLIX LANCETS MISC by Does not apply route.    Marland Kitchen albuterol (PROVENTIL HFA;VENTOLIN HFA) 108 (90 Base) MCG/ACT inhaler Inhale 2 puffs into the lungs every 6 (six) hours as needed for wheezing or shortness of breath. 1 Inhaler 2  . aspirin 81 MG tablet Take 1 tablet (81 mg total) by mouth daily. 90 tablet 3  . atorvastatin (LIPITOR) 80 MG tablet Take 1 tablet (80 mg total) by mouth daily. 90 tablet 3  . Blood Glucose Monitoring Suppl (ACCU-CHEK AVIVA PLUS) w/Device KIT by Does not apply route.    . carvedilol (COREG) 6.25 MG tablet Take 1.5 tablets (9.375 mg total) by mouth 2 (two) times daily. 90 tablet 6  . clopidogrel (PLAVIX) 75 MG tablet Take 1 tablet (75 mg total) by mouth daily. 90 tablet 3  . glucose blood test strip 1 each by Other route as  needed. Use as instructed    . hydrALAZINE (APRESOLINE) 25 MG tablet Take 25 mg by mouth 3 (three) times daily.    . isosorbide mononitrate (IMDUR) 30 MG 24 hr tablet Take 1 tablet (30 mg total) by mouth daily. 30 tablet 11  . Multiple Vitamin (MULTIVITAMIN) capsule Take 1 capsule by mouth daily.      . nitroGLYCERIN (NITROSTAT) 0.4 MG SL tablet Place 0.4 mg under the tongue every 5 (five) minutes as needed for chest pain.    . potassium chloride SA (K-DUR,KLOR-CON) 20 MEQ tablet Take 2 tablets (40 mEq total) by mouth every morning AND 1 tablet (20 mEq total) every evening. 120 tablet 11  . spironolactone (ALDACTONE) 25 MG tablet Take 0.5 tablets (12.5 mg total)  by mouth daily. 15 tablet 3  . torsemide (DEMADEX) 20 MG tablet Take 3 tablets (60 mg total) by mouth daily. Can take an extra 40 mg as as needed for swelling 120 tablet 11  . umeclidinium-vilanterol (ANORO ELLIPTA) 62.5-25 MCG/INH AEPB Inhale 1 puff into the lungs daily. 60 each 5   No current facility-administered medications for this encounter.    BP 110/70   Pulse 68   Wt 76.3 kg (168 lb 3.2 oz)   SpO2 97%   BMI 27.99 kg/m  General: NAD Neck: JVP 7-8 cm, no thyromegaly or thyroid nodule.  Lungs: Clear to auscultation bilaterally with normal respiratory effort. CV: Nondisplaced PMI.  Heart regular S1/S2, no S3/S4, no murmur.  1+ ankle edema.   Abdomen: Soft, nontender, no hepatosplenomegaly, no distention.  Skin: Intact without lesions or rashes.  Neurologic: Alert and oriented x 3.  Psych: Normal affect. Extremities: No clubbing or cyanosis.  HEENT: Normal.   Assessment/Plan: 1. Chronic systolic CHF: Echo 0/17 with EF 25-30%.  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  LV dysfunction is out of proportion to degree of coronary disease.  Moderate functional impairment on 9/19 CPX.  NYHA class II symptoms. He is mildly volume overloaded on exam after holding torsemide for 3 days.  - Restart torsemide at 60 mg daily (had been taking torsemide 80 mg daily).  BMET today and in 10 days.   - Increase Coreg to 9.375 mg bid. - Continue hydralazine 25 mg tid and Imdur 30 daily (he does not have insurance coverage for Bidil). - Continue spironolactone 12.5 daily.  - Creatinine too high for ACEI/ARB/ARNI.   - Narrow QRS, not CRT candidate. With persistently low EF, I referred to EP for ICD.  He wants to think further about ICD before committing, will readdress next appt.  2. CAD: DES to pLCx in 3/19.   - Continue ASA 81 and Plavix.   - Continue atorvastatin 80 mg daily, good lipids in 7/19.  3. CKD: Stage 3.  BMET today, will need to watch closely with diuresis.  - Will make  nephrology referral.  4. PVCs: Not enough PVCs on Holter to cause cardiomyopathy.   Followup 3 weeks with APP and 3 months with me.   Loralie Champagne 10/29/2017

## 2017-11-02 ENCOUNTER — Telehealth (HOSPITAL_COMMUNITY): Payer: Self-pay | Admitting: *Deleted

## 2017-11-02 ENCOUNTER — Encounter (HOSPITAL_COMMUNITY): Payer: Self-pay | Admitting: *Deleted

## 2017-11-02 MED ORDER — POTASSIUM CHLORIDE CRYS ER 20 MEQ PO TBCR: 40.0000 meq | EXTENDED_RELEASE_TABLET | Freq: Every day | ORAL | 11 refills | Status: DC

## 2017-11-02 NOTE — Telephone Encounter (Signed)
Notes recorded by Scarlette Calico, RN on 11/02/2017 at 1:46 PM EDT Pt aware, agreeable and verbalizes understanding ------  Notes recorded by Kerry Dory, CMA on 10/28/2017 at 3:49 PM EDT Left message for patient to call back.  [phone] (H) ------  Notes recorded by Larey Dresser, MD on 10/28/2017 at 3:36 PM EDT Decrease KCl to 40 daily. BMET 1 week.      Pt already sch for bmet on 10/7

## 2017-11-02 NOTE — Progress Notes (Signed)
Referral and records faxed to [facility]

## 2017-11-07 ENCOUNTER — Ambulatory Visit (HOSPITAL_COMMUNITY)
Admission: RE | Admit: 2017-11-07 | Discharge: 2017-11-07 | Disposition: A | Discharge status: Home / Self Care | Payer: Self-pay | Source: Ambulatory Visit | Attending: Internal Medicine | Admitting: Internal Medicine

## 2017-11-07 ENCOUNTER — Telehealth (HOSPITAL_COMMUNITY): Payer: Self-pay | Admitting: *Deleted

## 2017-11-07 DIAGNOSIS — I5022 Chronic systolic (congestive) heart failure: Secondary | ICD-10-CM | POA: Insufficient documentation

## 2017-11-07 LAB — BASIC METABOLIC PANEL
ANION GAP: 10 (ref 5–15)
BUN: 27 mg/dL — ABNORMAL HIGH (ref 8–23)
CO2: 24 mmol/L (ref 22–32)
Calcium: 8.6 mg/dL — ABNORMAL LOW (ref 8.9–10.3)
Chloride: 105 mmol/L (ref 98–111)
Creatinine, Ser: 2.03 mg/dL — ABNORMAL HIGH (ref 0.61–1.24)
GFR calc non Af Amer: 32 mL/min — ABNORMAL LOW (ref >60)
GFR, EST AFRICAN AMERICAN: 37 mL/min — AB (ref >60)
GLUCOSE: 186 mg/dL — AB (ref 70–99)
POTASSIUM: 3.1 mmol/L — AB (ref 3.5–5.1)
Sodium: 139 mmol/L (ref 135–145)

## 2017-11-07 MED ORDER — POTASSIUM CHLORIDE CRYS ER 20 MEQ PO TBCR: 60.0000 meq | EXTENDED_RELEASE_TABLET | Freq: Every day | ORAL | 3 refills | Status: DC

## 2017-11-07 NOTE — Telephone Encounter (Signed)
-----   Message from Larey Dresser, MD sent at 11/07/2017  1:53 PM EDT ----- Needs to add 20 mEq daily KCl to current regimen.  BMET 1 week.

## 2017-11-07 NOTE — Telephone Encounter (Signed)
Notes recorded by Harvie Junior, CMA on 11/07/2017 at 3:52 PM EDT Pt aware and agreeable with plan. Meds updated in chart and new rx sent to pharmacy. ------  Notes recorded by Larey Dresser, MD on 11/07/2017 at 1:53 PM EDT Needs to add 20 mEq daily KCl to current regimen. BMET 1 week.

## 2017-11-09 ENCOUNTER — Ambulatory Visit (INDEPENDENT_AMBULATORY_CARE_PROVIDER_SITE_OTHER): Payer: Self-pay | Admitting: Pulmonary Disease

## 2017-11-09 ENCOUNTER — Encounter: Payer: Self-pay | Admitting: Pulmonary Disease

## 2017-11-09 VITALS — BP 110/78 | HR 68 | Ht 65.0 in | Wt 168.0 lb

## 2017-11-09 DIAGNOSIS — J439 Emphysema, unspecified: Secondary | ICD-10-CM

## 2017-11-09 DIAGNOSIS — Z720 Tobacco use: Secondary | ICD-10-CM

## 2017-11-09 DIAGNOSIS — G4733 Obstructive sleep apnea (adult) (pediatric): Secondary | ICD-10-CM

## 2017-11-09 NOTE — Progress Notes (Signed)
[facility]  Chief Complaint  Patient presents with  . Follow-up    Pt is doing well since last ov.     Constitutional:  BP 110/78 (BP Location: Left Arm, Cuff Size: Normal)   Pulse 68   Ht _0  (1.651 m)   Wt 168 lb (76.2 kg)   SpO2 98%   BMI 27.96 kg/m   Past Medical History:  CAD, CKD, Systolic CHF, Mitral regurgitation, HTN  Brief Summary:  Douglas Villa is a 67 y.o. male smoker with COPD/emphysema, and obstructive sleep apnea.  He started smoking again a few weeks.  He was able to quit on his own before, and plans to do so again.  His niece is going to quit smoking and he will try quitting with her.  He is not having cough, wheeze, sputum, chest pain, fever, hemoptysis, or leg swelling.  He uses anoro and this helps.  Doesn't need to use albuterol much.  He is using CPAP.  No luck in tracking down prior sleep studies.  He isn't sure if he can get supplies from his previous DME based out of [place].     Physical Exam:   Appearance - well kempt  ENMT - clear nasal mucosa, midline nasal septum, no oral exudates, no LAN, trachea midline Respiratory - normal chest wall, normal respiratory effort, no accessory muscle use, no wheeze/rales CV - s1s2 regular rate and rhythm, 2/6 systolic murmur, no peripheral edema, radial pulses symmetric GI - soft, non tender, no masses Lymph - no adenopathy noted in neck and axillary areas MSK - normal muscle strength and tone, normal gait Ext - no cyanosis, clubbing, or joint inflammation noted Skin - no rashes, lesions, or ulcers Neuro - oriented to person, place, and time Psych - normal mood and affect   Assessment/Plan:   COPD with emphysema. - continue anoro and prn albuterol - had extensive discussion about benefit for influenza vaccination >> he will think about this  Tobacco abuse. - reviewed options to assist with smoking cessation - he has a plan to quit on his  own  Lung cancer screening. - he is at higher risk for lung cancer given smoking history - he will call if he wants to get set up for lung cancer screening program  Obstructive sleep apnea. - he will call if he wants to repeat home sleep study so he can get new DME in [place] set up    Patient Instructions  Call if you want to set up another sleep study, lung cancer screening program or get a flu shot  Follow up in 6 months    Chesley Mires, MD [place] Pager: [phone] 11/09/2017, 11:44 AM  Flow Sheet     Pulmonary tests:  PFT 08/05/17 >> FEV1 1.44 (61%), FEV1% 68, TLC 7.43 (123%), DLCO 17%, +BD  Sleep tests:    Cardiac tests:  Echo 07/21/17 >> EF 25 to 30%, grade 3 DD, PAS 57 mmHg, mod/severe MR   Medications:   Allergies as of 11/09/2017      Reactions   Penicillins    Sweating      Medication List        Accurate as of 11/09/17 11:44 AM. Always use your most recent med list.          ACCU-CHEK AVIVA PLUS w/Device Kit by Does not apply route.   ACCU-CHEK FASTCLIX LANCETS Misc by Does not apply route.   albuterol 108 (90  Base) MCG/ACT inhaler Commonly known as:  PROVENTIL HFA;VENTOLIN HFA Inhale 2 puffs into the lungs every 6 (six) hours as needed for wheezing or shortness of breath.   aspirin 81 MG tablet Take 1 tablet (81 mg total) by mouth daily.   atorvastatin 80 MG tablet Commonly known as:  LIPITOR Take 1 tablet (80 mg total) by mouth daily.   carvedilol 6.25 MG tablet Commonly known as:  COREG Take 1.5 tablets (9.375 mg total) by mouth 2 (two) times daily.   clopidogrel 75 MG tablet Commonly known as:  PLAVIX Take 1 tablet (75 mg total) by mouth daily.   glucose blood test strip 1 each by Other route as needed. Use as instructed   hydrALAZINE 25 MG tablet Commonly known as:  APRESOLINE Take 25 mg by mouth 3 (three) times daily.   isosorbide mononitrate 30 MG 24 hr tablet Commonly known as:  IMDUR Take 1  tablet (30 mg total) by mouth daily.   multivitamin capsule Take 1 capsule by mouth daily.   nitroGLYCERIN 0.4 MG SL tablet Commonly known as:  NITROSTAT Place 0.4 mg under the tongue every 5 (five) minutes as needed for chest pain.   potassium chloride SA 20 MEQ tablet Commonly known as:  K-DUR,KLOR-CON Take 3 tablets (60 mEq total) by mouth daily.   spironolactone 25 MG tablet Commonly known as:  ALDACTONE Take 0.5 tablets (12.5 mg total) by mouth daily.   torsemide 20 MG tablet Commonly known as:  DEMADEX Take 3 tablets (60 mg total) by mouth daily. Can take an extra 40 mg as as needed for swelling   umeclidinium-vilanterol 62.5-25 MCG/INH Aepb Commonly known as:  ANORO ELLIPTA Inhale 1 puff into the lungs daily.       Past Surgical History:  He  has a past surgical history that includes Appendectomy (1969); Cardiac catheterization; and Coronary angioplasty.  Family History:  His family history includes Cancer (age of onset: 42) in his mother; Stroke (age of onset: 47) in his father.  Social History:  He  reports that he has been smoking cigarettes. He has a 45.00 pack-year smoking history. He has never used smokeless tobacco. He reports that he does not drink alcohol or use drugs.

## 2017-11-09 NOTE — Patient Instructions (Signed)
Call if you want to set up another sleep study, lung cancer screening program or get a flu shot  Follow up in 6 months

## 2017-11-17 NOTE — Progress Notes (Signed)
Advanced Heart Failure Clinic Note  PCP: Dr. Henrene Pastor Cardiology: Dr. Geraldo Pitter HF Cardiology: Dr. Aundra Dubin  67 y.o. with history of CAD and cardiomyopathy referred by Dr. Geraldo Pitter for evaluation of CHF.  Patient has had a history of HTN and smoking (quit smoking in 2/19) with COPD.  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  He had had one prior episode of CHF in 2016 where he was admitted at [facility] (no records available).  At the hospital in [place], he had LHC showed occluded PLV and 80% stenosis proximal LCx.  EF was 25-35% by LV-gram.  He had DES placed to pLCx.  He is not talking clopidogrel or ticagrelor.  He does not think he ever got it after his stent was placed.   Echo was done in 6/19.  This showed EF 25-30% with moderate-severe MR, moderate-severe TR. He saw Dr. Caryl Comes for ICD consideration, given frequent PVCs a holter was placed in 9/19 => 2% PVCs.  CPX was done in 9/19 showing moderate functional impairment from heart failure.    He returns today for HF follow up. Last visit coreg was increased and torsemide was restarted at lower dose. Overall doing "great". He had some dizziness on the first night after increasing his coreg, but none since. Denies SOB. Able to walk up stairs and hills with no problems. No orthopnea, PND, or edema. He can tell when his fluid goes up and down based on how well his ring fits him and it is loose today. No CP. He is still unsure whether he wants an ICD. Sees nephrology next week. Misses a medication every once in a while, but typically compliant. Not weighing regularly.   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  Labs (9/19): K 4.5, creatinine 2.5  PMH: 1. HTN 2. CKD stage 3 3. COPD: Quit smoking in 2/19.  4. OSA 5. Hyperlipidemia 6. 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   - LHC (3/19): Done in [place].  Totally occluded PLV, 80% pLCx stenosis => DES to LCx.  LV-gram with EF 25-35%. - Echo (6/19): EF 25-30%, grade III diastolic dysfunction, moderate-severe MR, moderate-severe TR, PASP 57 mmHg.  - CPX (9/19): with peak VO2 17.8, VE/VCO2 slope 38, RER 1.11 => moderate functional impairment.  7. CAD: LHC (3/19): Done in [place].  Totally occluded PLV, 80% pLCx stenosis => DES to LCx.  8. PVCs: Holter 9/19 with 2% PVCs.   SH: Lives alone in [place], [place].  Quit smoking in 2/19. Rare ETOH.   Family History  Problem Relation Age of Onset  . Cancer Mother 8  . Stroke Father 77   Review of systems complete and found to be negative unless listed in HPI.   Current Outpatient Medications  Medication Sig Dispense Refill  . ACCU-CHEK FASTCLIX LANCETS MISC by Does not apply route.    Marland Kitchen albuterol (PROVENTIL HFA;VENTOLIN HFA) 108 (90 Base) MCG/ACT inhaler Inhale 2 puffs into the lungs every 6 (six) hours as needed for wheezing or shortness of breath. 1 Inhaler 2  . aspirin 81 MG tablet Take 1 tablet (81 mg total) by mouth daily. 90 tablet 3  . atorvastatin (LIPITOR) 80 MG tablet Take 1 tablet (80 mg total) by mouth daily. 90 tablet 3  . Blood Glucose Monitoring Suppl (ACCU-CHEK AVIVA PLUS) w/Device KIT by  Does not apply route.    . carvedilol (COREG) 6.25 MG tablet Take 1.5 tablets (9.375 mg total) by mouth 2 (two) times daily. 90 tablet 6  . clopidogrel (PLAVIX) 75 MG tablet Take 1 tablet (75 mg total) by mouth daily. 90 tablet 3  . glucose blood test strip 1 each by Other route as needed. Use as instructed    . hydrALAZINE (APRESOLINE) 25 MG tablet Take 25 mg by mouth 3 (three) times daily.    . isosorbide mononitrate (IMDUR) 30 MG 24 hr tablet Take 1 tablet (30 mg total) by mouth daily. 30 tablet 11  . Multiple Vitamin (MULTIVITAMIN) capsule Take 1 capsule by mouth daily.      . nitroGLYCERIN (NITROSTAT) 0.4 MG SL  tablet Place 0.4 mg under the tongue every 5 (five) minutes as needed for chest pain.    . potassium chloride SA (K-DUR,KLOR-CON) 20 MEQ tablet Take 3 tablets (60 mEq total) by mouth daily. 90 tablet 3  . spironolactone (ALDACTONE) 25 MG tablet Take 0.5 tablets (12.5 mg total) by mouth daily. 15 tablet 3  . torsemide (DEMADEX) 20 MG tablet Take 3 tablets (60 mg total) by mouth daily. Can take an extra 40 mg as as needed for swelling 120 tablet 11  . umeclidinium-vilanterol (ANORO ELLIPTA) 62.5-25 MCG/INH AEPB Inhale 1 puff into the lungs daily. 60 each 5   No current facility-administered medications for this encounter.    BP 110/70   Pulse 71   Wt 76.9 kg (169 lb 9.6 oz)   SpO2 96%   BMI 28.22 kg/m  General: Well appearing. No resp difficulty. HEENT: Normal Neck: Supple. JVP flat. Carotids 2+ bilat; no bruits. No thyromegaly or nodule noted. Cor: PMI nondisplaced. RRR, No M/G/R noted Lungs: CTAB, normal effort. Abdomen: Soft, non-tender, non-distended, no HSM. No bruits or masses. +BS  Extremities: No cyanosis, clubbing, or rash. R and LLE no edema.  Neuro: Alert & orientedx3, cranial nerves grossly intact. moves all 4 extremities w/o difficulty. Affect pleasant  Assessment/Plan: 1. Chronic systolic CHF: Echo 8/78 with EF 25-30%.  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  LV dysfunction is out of proportion to degree of coronary disease.  Moderate functional impairment on 9/19 CPX.  NYHA class II symptoms. Volume stable on exam.  - Continue torsemide 60 mg daily. BMET today.    - Continue Coreg 9.375 mg bid. - Continue hydralazine 25 mg tid and Imdur 30 daily (he does not have insurance coverage for Bidil). - Continue spironolactone 12.5 daily. Will not increase with recent AKI to 2.5.  - Creatinine too high for ACEI/ARB/ARNI.   - Narrow QRS, not CRT candidate. With persistently low EF, would like to refer to EP for ICD.  He wants to think further about ICD before  committing. We discussed again today. He is still unsure, but will let us know if he wants referral. Discussed benefit again today.  - Encouraged him to monitor daily weights and to call us for weight gain or change in symptoms. 2. CAD: DES to pLCx in 3/19.   - Continue ASA 81 and Plavix. Continue BB. - Continue atorvastatin 80 mg daily, good lipids in 7/19.  - No s/s ischemia 3. CKD: Stage 3.  BMET today. - He has first appointment with [facility] on Monday. Sees Dr Jonnie Finner and asks that labwork be sent to him. I will forward today once resulted. 4. PVCs: Not enough PVCs on Holter to cause cardiomyopathy. No change. 5. COPD - Follows with Dr  Sood   Keep 2 month f/u with Dr Aundra Dubin BMET today. Will forward to Dr Bonnita Nasuti [place] 11/18/2017  Greater than 50% of the 25 minute visit was spent in counseling/coordination of care regarding disease state education, salt/fluid restriction, sliding scale diuretics, and medication compliance.

## 2017-11-18 ENCOUNTER — Other Ambulatory Visit: Payer: Self-pay

## 2017-11-18 ENCOUNTER — Ambulatory Visit (HOSPITAL_COMMUNITY)
Admission: RE | Admit: 2017-11-18 | Discharge: 2017-11-18 | Disposition: A | Discharge status: Home / Self Care | Payer: Self-pay | Source: Ambulatory Visit | Attending: Internal Medicine | Admitting: Internal Medicine

## 2017-11-18 ENCOUNTER — Encounter (HOSPITAL_COMMUNITY): Payer: Self-pay

## 2017-11-18 VITALS — BP 110/70 | HR 71 | Wt 169.6 lb

## 2017-11-18 DIAGNOSIS — I130 Hypertensive heart and chronic kidney disease with heart failure and stage 1 through stage 4 chronic kidney disease, or unspecified chronic kidney disease: Secondary | ICD-10-CM | POA: Insufficient documentation

## 2017-11-18 DIAGNOSIS — G4733 Obstructive sleep apnea (adult) (pediatric): Secondary | ICD-10-CM | POA: Insufficient documentation

## 2017-11-18 DIAGNOSIS — N183 Chronic kidney disease, stage 3 unspecified: Secondary | ICD-10-CM

## 2017-11-18 DIAGNOSIS — Z87891 Personal history of nicotine dependence: Secondary | ICD-10-CM | POA: Insufficient documentation

## 2017-11-18 DIAGNOSIS — I5022 Chronic systolic (congestive) heart failure: Secondary | ICD-10-CM | POA: Insufficient documentation

## 2017-11-18 DIAGNOSIS — I493 Ventricular premature depolarization: Secondary | ICD-10-CM

## 2017-11-18 DIAGNOSIS — J449 Chronic obstructive pulmonary disease, unspecified: Secondary | ICD-10-CM | POA: Insufficient documentation

## 2017-11-18 DIAGNOSIS — Z7902 Long term (current) use of antithrombotics/antiplatelets: Secondary | ICD-10-CM | POA: Insufficient documentation

## 2017-11-18 DIAGNOSIS — Z7982 Long term (current) use of aspirin: Secondary | ICD-10-CM | POA: Insufficient documentation

## 2017-11-18 DIAGNOSIS — E785 Hyperlipidemia, unspecified: Secondary | ICD-10-CM | POA: Insufficient documentation

## 2017-11-18 DIAGNOSIS — Z79899 Other long term (current) drug therapy: Secondary | ICD-10-CM | POA: Insufficient documentation

## 2017-11-18 DIAGNOSIS — I2510 Atherosclerotic heart disease of native coronary artery without angina pectoris: Secondary | ICD-10-CM | POA: Insufficient documentation

## 2017-11-18 LAB — BASIC METABOLIC PANEL
Anion gap: 8 (ref 5–15)
BUN: 22 mg/dL (ref 8–23)
CALCIUM: 9.1 mg/dL (ref 8.9–10.3)
CHLORIDE: 105 mmol/L (ref 98–111)
CO2: 28 mmol/L (ref 22–32)
CREATININE: 1.92 mg/dL — AB (ref 0.61–1.24)
GFR calc non Af Amer: 34 mL/min — ABNORMAL LOW (ref >60)
GFR, EST AFRICAN AMERICAN: 39 mL/min — AB (ref >60)
Glucose, Bld: 161 mg/dL — ABNORMAL HIGH (ref 70–99)
Potassium: 3.7 mmol/L (ref 3.5–5.1)
Sodium: 141 mmol/L (ref 135–145)

## 2017-11-18 NOTE — Patient Instructions (Addendum)
Labs today We will only contact you if something comes back abnormal or we need to make some changes. Otherwise no news is good news!   Your physician recommends that you schedule a follow-up appointment in: Keep follow up that's scheduled in 2 months with Dr Aundra Dubin   Do the following things EVERYDAY: 1) Weigh yourself in the morning before breakfast. Write it down and keep it in a log. 2) Take your medicines as prescribed 3) Eat low salt foods-Limit salt (sodium) to 2000 mg per day.  4) Stay as active as you can everyday 5) Limit all fluids for the day to less than 2 liters

## 2018-01-30 ENCOUNTER — Ambulatory Visit (HOSPITAL_COMMUNITY)
Admission: RE | Admit: 2018-01-30 | Discharge: 2018-01-30 | Disposition: A | Discharge status: Home / Self Care | Payer: Self-pay | Source: Ambulatory Visit | Attending: Cardiology | Admitting: Cardiology

## 2018-01-30 VITALS — BP 122/88 | HR 69 | Wt 174.0 lb

## 2018-01-30 DIAGNOSIS — I130 Hypertensive heart and chronic kidney disease with heart failure and stage 1 through stage 4 chronic kidney disease, or unspecified chronic kidney disease: Secondary | ICD-10-CM | POA: Insufficient documentation

## 2018-01-30 DIAGNOSIS — I429 Cardiomyopathy, unspecified: Secondary | ICD-10-CM | POA: Insufficient documentation

## 2018-01-30 DIAGNOSIS — I493 Ventricular premature depolarization: Secondary | ICD-10-CM | POA: Insufficient documentation

## 2018-01-30 DIAGNOSIS — E785 Hyperlipidemia, unspecified: Secondary | ICD-10-CM | POA: Insufficient documentation

## 2018-01-30 DIAGNOSIS — Z7982 Long term (current) use of aspirin: Secondary | ICD-10-CM | POA: Insufficient documentation

## 2018-01-30 DIAGNOSIS — N183 Chronic kidney disease, stage 3 unspecified: Secondary | ICD-10-CM

## 2018-01-30 DIAGNOSIS — G4733 Obstructive sleep apnea (adult) (pediatric): Secondary | ICD-10-CM | POA: Insufficient documentation

## 2018-01-30 DIAGNOSIS — F17210 Nicotine dependence, cigarettes, uncomplicated: Secondary | ICD-10-CM | POA: Insufficient documentation

## 2018-01-30 DIAGNOSIS — J449 Chronic obstructive pulmonary disease, unspecified: Secondary | ICD-10-CM | POA: Insufficient documentation

## 2018-01-30 DIAGNOSIS — I5022 Chronic systolic (congestive) heart failure: Secondary | ICD-10-CM | POA: Insufficient documentation

## 2018-01-30 DIAGNOSIS — Z7902 Long term (current) use of antithrombotics/antiplatelets: Secondary | ICD-10-CM | POA: Insufficient documentation

## 2018-01-30 DIAGNOSIS — Z79899 Other long term (current) drug therapy: Secondary | ICD-10-CM | POA: Insufficient documentation

## 2018-01-30 DIAGNOSIS — I2510 Atherosclerotic heart disease of native coronary artery without angina pectoris: Secondary | ICD-10-CM | POA: Insufficient documentation

## 2018-01-30 DIAGNOSIS — I5023 Acute on chronic systolic (congestive) heart failure: Secondary | ICD-10-CM | POA: Insufficient documentation

## 2018-01-30 LAB — BASIC METABOLIC PANEL
ANION GAP: 12 (ref 5–15)
BUN: 24 mg/dL — AB (ref 8–23)
CO2: 23 mmol/L (ref 22–32)
Calcium: 8.8 mg/dL — ABNORMAL LOW (ref 8.9–10.3)
Chloride: 104 mmol/L (ref 98–111)
Creatinine, Ser: 1.81 mg/dL — ABNORMAL HIGH (ref 0.61–1.24)
GFR calc Af Amer: 43 mL/min — ABNORMAL LOW (ref >60)
GFR calc non Af Amer: 37 mL/min — ABNORMAL LOW (ref >60)
GLUCOSE: 116 mg/dL — AB (ref 70–99)
POTASSIUM: 3.7 mmol/L (ref 3.5–5.1)
Sodium: 139 mmol/L (ref 135–145)

## 2018-01-30 MED ORDER — CARVEDILOL 6.25 MG PO TABS: 12.5000 mg | ORAL_TABLET | Freq: Two times a day (BID) | ORAL | 6 refills | Status: DC

## 2018-01-30 MED ORDER — SPIRONOLACTONE 25 MG PO TABS: 25.0000 mg | ORAL_TABLET | Freq: Every day | ORAL | 3 refills | Status: DC

## 2018-01-30 MED ORDER — CLOPIDOGREL BISULFATE 75 MG PO TABS: 75.0000 mg | ORAL_TABLET | Freq: Every day | ORAL | 3 refills | Status: DC

## 2018-01-30 NOTE — Patient Instructions (Signed)
Today you have been seen at the Heart failure clinic at [facility]   Medication changes: Increase Coreg to  12.5mg  BID  Increase spironolactone to 25mg  daily   You had an EKG done today   You had  Lab work done today:  Scheduled Lab work: in 10days  We will call you if your lab work is abnormal.. No news is good news!!   Follow up with Dr. Aundra Dubin in 6 weeks  You have the following test scheduled for: ECHO    Your physician has requested that you have an echocardiogram. Echocardiography is a painless test that uses sound waves to create images of your heart. It provides your doctor with information about the size and shape of your heart and how well your heart's chambers and valves are working. This procedure takes approximately one hour. There are no restrictions for this procedure.

## 2018-01-30 NOTE — Progress Notes (Signed)
PCP: Dr. Henrene Pastor Cardiology: Dr. Geraldo Pitter HF Cardiology: Dr. Aundra Dubin  67 yo with history of CAD and cardiomyopathy referred by Dr. Geraldo Pitter for evaluation of CHF.  Patient has had a history of HTN and smoking (quit smoking in 2/19) with COPD.  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  He had had one prior episode of CHF in 2016 where he was admitted at [facility] (no records available).  At the hospital in [place], he had LHC showed occluded PLV and 80% stenosis proximal LCx.  EF was 25-35% by LV-gram.  He had DES placed to pLCx.  He is not talking clopidogrel or ticagrelor.  He does not think he ever got it after his stent was placed.   Echo was done in 6/19.  This showed EF 25-30% with moderate-severe MR, moderate-severe TR. He saw Dr. Caryl Comes for ICD consideration, given frequent PVCs a holter was placed in 9/19 => 2% PVCs.  CPX was done in 9/19 showing moderate functional impairment from heart failure.    Patient returns for followup of CHF.  He still smokes 1 ppd.  Says he would have a hard time stopping right now due to stress.  No chest pain.  Breathing is doing ok, dyspneic only with heavy exertion.  No orthopnea/PND.  No lightheadedness.  Weight is up about 5 lbs.   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  Labs (9/19): K 4.5, creatinine 2.5 Labs (10/19): K 3.7, creatinine 1.92  ECG (personally reviewed): NSR, nonspecific T wave flattening  PMH: 1. HTN 2. CKD stage 3 3. COPD: Still smoking.   4. OSA 5. Hyperlipidemia 6. 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   - LHC (3/19): Done in [place].  Totally occluded PLV, 80% pLCx stenosis => DES to LCx.  LV-gram with EF 25-35%. - Echo (6/19): EF 25-30%, grade III diastolic dysfunction, moderate-severe MR, moderate-severe TR,  PASP 57 mmHg.  - CPX (9/19): with peak VO2 17.8, VE/VCO2 slope 38, RER 1.11 => moderate functional impairment.  7. CAD: LHC (3/19): Done in [place].  Totally occluded PLV, 80% pLCx stenosis => DES to LCx.  8. PVCs: Holter 9/19 with 2% PVCs.   SH: Lives alone in [place], [place].  Quit smoking in 2/19 but restarted and now smokes 1 ppd. Rare ETOH.   Family History  Problem Relation Age of Onset  . Cancer Mother 81  . Stroke Father 55   ROS: All systems reviewed and negative except as per HPI.  Current Outpatient Medications  Medication Sig Dispense Refill  . ACCU-CHEK FASTCLIX LANCETS MISC by Does not apply route.    Marland Kitchen albuterol (PROVENTIL HFA;VENTOLIN HFA) 108 (90 Base) MCG/ACT inhaler Inhale 2 puffs into the lungs every 6 (six) hours as needed for wheezing or shortness of breath. 1 Inhaler 2  . aspirin 81 MG tablet Take 1 tablet (81 mg total) by mouth daily. 90 tablet 3  . atorvastatin (LIPITOR) 80 MG tablet Take 1 tablet (80 mg total) by mouth daily. 90 tablet 3  . clopidogrel (PLAVIX) 75 MG tablet Take 1 tablet (75 mg total) by mouth daily. 90 tablet 3  . hydrALAZINE (APRESOLINE) 25 MG tablet Take 25 mg by mouth 3 (three) times daily.    . Multiple Vitamin (MULTIVITAMIN) capsule Take 1 capsule by mouth daily.      Marland Kitchen  potassium chloride SA (K-DUR,KLOR-CON) 20 MEQ tablet Take 3 tablets (60 mEq total) by mouth daily. 90 tablet 3  . spironolactone (ALDACTONE) 25 MG tablet Take 1 tablet (25 mg total) by mouth daily. 15 tablet 3  . torsemide (DEMADEX) 20 MG tablet Take 3 tablets (60 mg total) by mouth daily. Can take an extra 40 mg as as needed for swelling 120 tablet 11  . Blood Glucose Monitoring Suppl (ACCU-CHEK AVIVA PLUS) w/Device KIT by Does not apply route.    . carvedilol (COREG) 6.25 MG tablet Take 2 tablets (12.5 mg total) by mouth 2 (two) times daily. 90 tablet 6  . glucose blood test strip 1 each by Other route as needed. Use as instructed    . isosorbide mononitrate  (IMDUR) 30 MG 24 hr tablet Take 1 tablet (30 mg total) by mouth daily. 30 tablet 11  . nitroGLYCERIN (NITROSTAT) 0.4 MG SL tablet Place 0.4 mg under the tongue every 5 (five) minutes as needed for chest pain.    Marland Kitchen umeclidinium-vilanterol (ANORO ELLIPTA) 62.5-25 MCG/INH AEPB Inhale 1 puff into the lungs daily. (Patient not taking: Reported on 01/30/2018) 60 each 5   No current facility-administered medications for this encounter.    BP 122/88   Pulse 69   Wt 78.9 kg (174 lb)   SpO2 99%   BMI 28.96 kg/m  General: NAD Neck: No JVD, no thyromegaly or thyroid nodule.  Lungs: Clear to auscultation bilaterally with normal respiratory effort. CV: Nondisplaced PMI.  Heart regular S1/S2, no S3/S4, no murmur.  No peripheral edema.  No carotid bruit.  Normal pedal pulses.  Abdomen: Soft, nontender, no hepatosplenomegaly, no distention.  Skin: Intact without lesions or rashes.  Neurologic: Alert and oriented x 3.  Psych: Normal affect. Extremities: No clubbing or cyanosis.  HEENT: Normal.   Assessment/Plan: 1. Chronic systolic CHF: Echo 5/40 with EF 25-30%.  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  LV dysfunction is out of proportion to degree of coronary disease.  Moderate functional impairment on 9/19 CPX.  NYHA class II symptoms. Weight is up a bit but he is not volume overloaded on exam.  - Continue torsemide 60 mg daily. - Increase Coreg to 18.75 mg bid. - Continue hydralazine 25 mg tid and Imdur 30 daily (he does not have insurance coverage for Bidil). - Increase spironolactone to 25 mg daily.  BMET today and again in 10 days.   - Have held off on ACEI/ARB/ARNI with elevated creatinine.   - Narrow QRS, not CRT candidate. He is still reluctant to get an ICD.  I will repeat an echo, if EF remains low, would encourage ICD (has component of ischemic cardiomyopathy).  2. CAD: DES to pLCx in 3/19.   - Continue ASA 81 and Plavix.  He is not totally sure that he is taking  clopidogrel.  I will send in a refill and he will start if he is not taking.  - Continue atorvastatin 80 mg daily, good lipids in 7/19.  3. CKD: Stage 3.  He follows with nephrology. 4. PVCs: Not enough PVCs on Holter to cause cardiomyopathy.  5. COPD: He is still smoking 1 ppd.  I encourage him to quit and offered Chantix if he decides that he wants to stop. He says that he will contact me when ready.  Followup in 6 wks with me.  He has some trouble keeping track of his meds.  We went over his meds extensively today and he will have a list at discharge.  Unfortunately, unable to participate in paramedicine program as he lives in [place].    Loralie Champagne 01/30/2018

## 2018-02-09 ENCOUNTER — Ambulatory Visit (HOSPITAL_COMMUNITY)
Admission: RE | Admit: 2018-02-09 | Discharge: 2018-02-09 | Disposition: A | Discharge status: Home / Self Care | Payer: Self-pay | Source: Ambulatory Visit | Attending: Internal Medicine | Admitting: Internal Medicine

## 2018-02-09 ENCOUNTER — Ambulatory Visit (HOSPITAL_COMMUNITY)
Admission: RE | Admit: 2018-02-09 | Discharge: 2018-02-09 | Disposition: A | Discharge status: Home / Self Care | Payer: Self-pay | Source: Ambulatory Visit | Attending: Cardiology | Admitting: Cardiology

## 2018-02-09 DIAGNOSIS — E782 Mixed hyperlipidemia: Secondary | ICD-10-CM

## 2018-02-09 DIAGNOSIS — I5023 Acute on chronic systolic (congestive) heart failure: Secondary | ICD-10-CM | POA: Insufficient documentation

## 2018-02-09 LAB — BASIC METABOLIC PANEL
Anion gap: 12 (ref 5–15)
BUN: 32 mg/dL — ABNORMAL HIGH (ref 8–23)
CHLORIDE: 101 mmol/L (ref 98–111)
CO2: 22 mmol/L (ref 22–32)
CREATININE: 2.35 mg/dL — AB (ref 0.61–1.24)
Calcium: 9.2 mg/dL (ref 8.9–10.3)
GFR calc Af Amer: 32 mL/min — ABNORMAL LOW (ref >60)
GFR calc non Af Amer: 27 mL/min — ABNORMAL LOW (ref >60)
Glucose, Bld: 121 mg/dL — ABNORMAL HIGH (ref 70–99)
Potassium: 3.7 mmol/L (ref 3.5–5.1)
Sodium: 135 mmol/L (ref 135–145)

## 2018-02-09 NOTE — Progress Notes (Signed)
  Echocardiogram 2D Echocardiogram has been performed.  Douglas Villa 02/09/2018, 3:52 PM

## 2018-02-10 ENCOUNTER — Telehealth (HOSPITAL_COMMUNITY): Payer: Self-pay

## 2018-02-10 DIAGNOSIS — I5022 Chronic systolic (congestive) heart failure: Secondary | ICD-10-CM

## 2018-02-10 MED ORDER — TORSEMIDE 20 MG PO TABS: 40.0000 mg | ORAL_TABLET | Freq: Every day | ORAL | 11 refills | Status: DC

## 2018-02-10 NOTE — Telephone Encounter (Signed)
-----   Message from Larey Dresser, MD sent at 02/10/2018  4:16 PM EST ----- Creatinine higher, decrease torsemide to 40 mg daily.  With persistently low EF, recommend referral to EP to get ICD.  We talked about this last appointment.

## 2018-02-10 NOTE — Telephone Encounter (Signed)
Spoke with Niece Verdene Lennert as patient not available. Given results of blood work. Advised to decrease torsemide to 40mg  daily. She was able to repeat back instructions.  Referral placed for EP.  Niece was made aware of same as well. States understanding.

## 2018-02-13 ENCOUNTER — Telehealth (HOSPITAL_COMMUNITY): Payer: Self-pay

## 2018-02-13 NOTE — Telephone Encounter (Signed)
error 

## 2018-02-13 NOTE — Telephone Encounter (Signed)
Pt is not available at this time, message was left for pt to call back to get ECHO results

## 2018-03-21 ENCOUNTER — Encounter (HOSPITAL_COMMUNITY): Payer: Self-pay | Admitting: Cardiology

## 2018-04-21 ENCOUNTER — Other Ambulatory Visit (HOSPITAL_COMMUNITY): Payer: Self-pay | Admitting: Cardiology

## 2018-05-05 ENCOUNTER — Other Ambulatory Visit (HOSPITAL_COMMUNITY): Payer: Self-pay | Admitting: *Deleted

## 2018-05-05 MED ORDER — SPIRONOLACTONE 25 MG PO TABS: 25.0000 mg | ORAL_TABLET | Freq: Every day | ORAL | 6 refills | Status: DC

## 2018-05-12 ENCOUNTER — Encounter (HOSPITAL_COMMUNITY): Payer: Self-pay | Admitting: Cardiology

## 2018-05-16 ENCOUNTER — Other Ambulatory Visit: Payer: Self-pay

## 2018-05-16 ENCOUNTER — Ambulatory Visit (HOSPITAL_COMMUNITY)
Admission: RE | Admit: 2018-05-16 | Discharge: 2018-05-16 | Disposition: A | Discharge status: Home / Self Care | Payer: Self-pay | Source: Ambulatory Visit | Attending: Cardiology | Admitting: Cardiology

## 2018-05-16 DIAGNOSIS — Z7902 Long term (current) use of antithrombotics/antiplatelets: Secondary | ICD-10-CM | POA: Insufficient documentation

## 2018-05-16 DIAGNOSIS — F17210 Nicotine dependence, cigarettes, uncomplicated: Secondary | ICD-10-CM | POA: Insufficient documentation

## 2018-05-16 DIAGNOSIS — I5022 Chronic systolic (congestive) heart failure: Secondary | ICD-10-CM

## 2018-05-16 DIAGNOSIS — I493 Ventricular premature depolarization: Secondary | ICD-10-CM | POA: Insufficient documentation

## 2018-05-16 DIAGNOSIS — I2510 Atherosclerotic heart disease of native coronary artery without angina pectoris: Secondary | ICD-10-CM

## 2018-05-16 DIAGNOSIS — N183 Chronic kidney disease, stage 3 unspecified: Secondary | ICD-10-CM

## 2018-05-16 DIAGNOSIS — M109 Gout, unspecified: Secondary | ICD-10-CM | POA: Insufficient documentation

## 2018-05-16 DIAGNOSIS — Z7982 Long term (current) use of aspirin: Secondary | ICD-10-CM | POA: Insufficient documentation

## 2018-05-16 DIAGNOSIS — Z955 Presence of coronary angioplasty implant and graft: Secondary | ICD-10-CM

## 2018-05-16 DIAGNOSIS — J449 Chronic obstructive pulmonary disease, unspecified: Secondary | ICD-10-CM | POA: Insufficient documentation

## 2018-05-16 DIAGNOSIS — Z880 Allergy status to penicillin: Secondary | ICD-10-CM | POA: Insufficient documentation

## 2018-05-16 DIAGNOSIS — I5023 Acute on chronic systolic (congestive) heart failure: Secondary | ICD-10-CM

## 2018-05-16 DIAGNOSIS — G4733 Obstructive sleep apnea (adult) (pediatric): Secondary | ICD-10-CM | POA: Insufficient documentation

## 2018-05-16 DIAGNOSIS — I429 Cardiomyopathy, unspecified: Secondary | ICD-10-CM | POA: Insufficient documentation

## 2018-05-16 DIAGNOSIS — E785 Hyperlipidemia, unspecified: Secondary | ICD-10-CM | POA: Insufficient documentation

## 2018-05-16 DIAGNOSIS — I130 Hypertensive heart and chronic kidney disease with heart failure and stage 1 through stage 4 chronic kidney disease, or unspecified chronic kidney disease: Secondary | ICD-10-CM | POA: Insufficient documentation

## 2018-05-16 DIAGNOSIS — Z79899 Other long term (current) drug therapy: Secondary | ICD-10-CM | POA: Insufficient documentation

## 2018-05-16 MED ORDER — COLCHICINE 0.6 MG PO CAPS: 0.6000 mg | ORAL_CAPSULE | Freq: Every day | ORAL | 0 refills | Status: DC

## 2018-05-16 MED ORDER — TORSEMIDE 20 MG PO TABS: 40.0000 mg | ORAL_TABLET | Freq: Every day | ORAL | 0 refills | Status: DC

## 2018-05-16 MED ORDER — HYDRALAZINE HCL 25 MG PO TABS: 37.5000 mg | ORAL_TABLET | Freq: Three times a day (TID) | ORAL | 0 refills | Status: DC

## 2018-05-16 NOTE — Patient Instructions (Signed)
1. Increase hydralazine to 37.5 mg tid /script sent 2. Increase torsemide to 60 daily alternating with 40 daily. /script sent 3. BMET + uric acid level in 1 week. See if this can be done at PCP's office and faxed to [place]. Kayleen Memos places 4. Refer to EP for ICD evaluation. /ref placed 5. Prescription for colchicine 0.6 mg bid x 2 days then daily after that as long as he has toe pain (for gout flare)./script sent

## 2018-05-17 NOTE — Progress Notes (Signed)
Heart Failure TeleHealth Note  Due to national recommendations of social distancing due to [place] 19, Audio/video telehealth visit is felt to be most appropriate for this patient at this time.  See MyChart message from today for patient consent regarding telehealth for [facility].  Date:  05/17/2018   ID:  Douglas Villa, DOB 1950-10-29, MRN [id number]  Location: Home  Provider location: [place] [facility] Type of Visit: Established patient  PCP:  Lillard Anes, MD  Cardiologist:  Dr. Aundra Dubin  Chief Complaint: Exertional shortness of breath   History of Present Illness: Douglas Villa is a 68 y.o. male who presents via audio/video conferencing for a telehealth visit today.  We attempted to get a video conference organized but were unable to coordinate the sound and picture on his device so switched to phone only.   he denies symptoms worrisome for COVID 19.   Patient has a history of CAD, cardiomyopathy, HTN, and smoking (quit smoking in 2/19) with COPD.  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  He had had one prior episode of CHF in 2016 where he was admitted at [facility] (no records available).  At the hospital in [place], he had LHC showed occluded PLV and 80% stenosis proximal LCx.  EF was 25-35% by LV-gram.  He had DES placed to pLCx.    Echo was done in 6/19.  This showed EF 25-30% with moderate-severe MR, moderate-severe TR. He saw Dr. Caryl Comes for ICD consideration, given frequent PVCs a holter was placed in 9/19 => 2% PVCs.  CPX was done in 9/19 showing moderate functional impairment from heart failure. Repeat echo in 1/20 showed EF 20-25% with severe LVH, mild MR, normal RV.   He continues to smoke 1 ppd.  He gets short of breath with heavy lifting/carrying as well as doing yardwork on a hot day, dyspnea mildly worse recently.  No dyspnea walking on flat ground.  No chest  pain.  No claudication.  He has left 1st MTP joint pain, the joint is warm as well. No edema.  Weight is up about 6 lbs.   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  Labs (9/19): K 4.5, creatinine 2.5 Labs (10/19): K 3.7, creatinine 1.92 Labs (1/20): K 3.7, creatinine 2.35  PMH: 1. HTN 2. CKD stage 3 3. COPD: Still smoking.   4. OSA 5. Hyperlipidemia 6. 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   - LHC (3/19): Done in [place].  Totally occluded PLV, 80% pLCx stenosis => DES to LCx.  LV-gram with EF 25-35%. - Echo (6/19): EF 25-30%, grade III diastolic dysfunction, moderate-severe MR, moderate-severe TR, PASP 57 mmHg.  - CPX (9/19): with peak VO2 17.8, VE/VCO2 slope 38, RER 1.11 => moderate functional impairment.  - Echo (1/20): EF 20-25%, severe LVH, mild MR, normal RV size and systolic function, PASP 33 mmHg.  7. CAD: LHC (3/19): Done in [place].  Totally occluded PLV, 80% pLCx stenosis => DES to LCx.  8. PVCs: Holter 9/19 with 2% PVCs.  9. Gout   Current Outpatient Medications  Medication Sig Dispense Refill  . ACCU-CHEK FASTCLIX LANCETS MISC by Does not apply route.    Marland Kitchen albuterol (PROVENTIL HFA;VENTOLIN HFA) 108 (90 Base) MCG/ACT inhaler Inhale 2 puffs into the lungs every 6 (six) hours  as needed for wheezing or shortness of breath. 1 Inhaler 2  . aspirin 81 MG tablet Take 1 tablet (81 mg total) by mouth daily. 90 tablet 3  . atorvastatin (LIPITOR) 80 MG tablet Take 1 tablet (80 mg total) by mouth daily. 90 tablet 3  . Blood Glucose Monitoring Suppl (ACCU-CHEK AVIVA PLUS) w/Device KIT by Does not apply route.    . carvedilol (COREG) 6.25 MG tablet Take 2 tablets (12.5 mg total) by mouth 2 (two) times daily. 90 tablet 6  . clopidogrel (PLAVIX) 75 MG tablet Take 1 tablet (75 mg total) by mouth daily. 90 tablet 3  . Colchicine 0.6 MG CAPS  Take 0.6 mg by mouth daily. Take 0.6 (1 tab) TWICE A DAY FOR TWO DAYS, then 0.6 mg (1 tab) each day as needed for pain 60 capsule 0  . glucose blood test strip 1 each by Other route as needed. Use as instructed    . hydrALAZINE (APRESOLINE) 25 MG tablet Take 1.5 tablets (37.5 mg total) by mouth 3 (three) times daily. 405 tablet 0  . isosorbide mononitrate (IMDUR) 30 MG 24 hr tablet Take 1 tablet (30 mg total) by mouth daily. 30 tablet 11  . Multiple Vitamin (MULTIVITAMIN) capsule Take 1 capsule by mouth daily.      . nitroGLYCERIN (NITROSTAT) 0.4 MG SL tablet Place 0.4 mg under the tongue every 5 (five) minutes as needed for chest pain.    . potassium chloride SA (K-DUR,KLOR-CON) 20 MEQ tablet Take 3 tablets (60 mEq total) by mouth daily. 90 tablet 3  . spironolactone (ALDACTONE) 25 MG tablet Take 1 tablet (25 mg total) by mouth daily. 30 tablet 6  . torsemide (DEMADEX) 20 MG tablet Take 2 tablets (40 mg total) by mouth daily. Alternate 60 mg (3 tabs) and 40 mg (2 tabs) every other day 270 tablet 0  . umeclidinium-vilanterol (ANORO ELLIPTA) 62.5-25 MCG/INH AEPB Inhale 1 puff into the lungs daily. (Patient not taking: Reported on 01/30/2018) 60 each 5   No current facility-administered medications for this encounter.     Allergies:   Penicillins   Social History:  The patient  reports that he has been smoking cigarettes. He has a 45.00 pack-year smoking history. He has never used smokeless tobacco. He reports that he does not drink alcohol or use drugs.   Family History:  The patient's family history includes Cancer (age of onset: 18) in his mother; Stroke (age of onset: 47) in his father.   ROS:  Please see the history of present illness.   All other systems are personally reviewed and negative.   Exam:  (Video/Tele Health Call; Exam is subjective and or/visual.) General:  Speaks in full sentences. No resp difficulty. Lungs: Normal respiratory effort with conversation.  Abdomen:  Non-distended per patient report Extremities: Pt denies edema. Neuro: Alert & oriented x 3.   Recent Labs: 08/19/2017: ALT 12 09/02/2017: B Natriuretic Peptide 1,943.8 02/09/2018: BUN 32; Creatinine, Ser 2.35; Potassium 3.7; Sodium 135  Personally reviewed   Wt Readings from Last 3 Encounters:  01/30/18 78.9 kg (174 lb)  11/18/17 76.9 kg (169 lb 9.6 oz)  11/09/17 76.2 kg (168 lb)      ASSESSMENT AND PLAN:  1. Chronic systolic CHF: Echo 1/61 with EF 25-30%.  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  LV dysfunction is out of proportion to degree of coronary disease.  Moderate functional impairment on 9/19 CPX.  Echo in 1/20 with EF 20-25%.  NYHA class II symptoms. Weight is  up, he thinks that he may be carrying some extra fluid as dyspnea is mildly worse.  - Increase torsemide to 60 daily alternating with 40 daily. BMET in 1 week.  - Continue Coreg 18.75 mg bid.  - Increase hydralazine to 37.5 mg tid and continue Imdur 30 daily (he does not have insurance coverage for Bidil). - Continue spironolactone 25 mg daily.   - Have held off on ACEI/ARB/ARNI with elevated creatinine.   - Narrow QRS, not CRT candidate. I will refer him to EP for ICD consideration.  2. CAD: DES to pLCx in 3/19.   - Continue ASA 81 and Plavix.   - Continue atorvastatin 80 mg daily, good lipids in 7/19.  3. CKD: Stage 3.  He follows with nephrology.  BMET 1 week.  4. PVCs: Not enough PVCs on Holter to cause cardiomyopathy.  5. COPD: He is still smoking 1 ppd.  I encourage him to quit and offered Chantix if he decides that he wants to stop. He says that he will contact me when ready. 6. Gout: Patient has classic gout in the left 1st MTP joint.   - Check uric acid with next labs.  - Start colchicine, continue until symptoms subside.   COVID screen The patient does not have any symptoms that suggest any further testing/ screening at this time.  Social distancing reinforced today.  Relevant cardiac  medications were reviewed at length with the patient today.   The patient does not have concerns regarding their medications at this time.   Recommended follow-up:  Followup 2 months  Today, I have spent 21 minutes with the patient with telehealth technology discussing the above issues .    Signed, Loralie Champagne, MD  05/17/2018 9:07 PM  [facility] [address] [facility] [place] [address] [phone] (office) [phone] (fax)

## 2018-05-24 ENCOUNTER — Telehealth (HOSPITAL_COMMUNITY): Payer: Self-pay

## 2018-05-24 ENCOUNTER — Telehealth (INDEPENDENT_AMBULATORY_CARE_PROVIDER_SITE_OTHER): Payer: Self-pay | Admitting: Internal Medicine

## 2018-05-24 ENCOUNTER — Other Ambulatory Visit: Payer: Self-pay

## 2018-05-24 VITALS — BP 147/90 | HR 73 | Ht 65.0 in | Wt 170.0 lb

## 2018-05-24 DIAGNOSIS — I5022 Chronic systolic (congestive) heart failure: Secondary | ICD-10-CM

## 2018-05-24 DIAGNOSIS — Z955 Presence of coronary angioplasty implant and graft: Secondary | ICD-10-CM

## 2018-05-24 DIAGNOSIS — I493 Ventricular premature depolarization: Secondary | ICD-10-CM

## 2018-05-24 DIAGNOSIS — I10 Essential (primary) hypertension: Secondary | ICD-10-CM

## 2018-05-24 DIAGNOSIS — I2510 Atherosclerotic heart disease of native coronary artery without angina pectoris: Secondary | ICD-10-CM

## 2018-05-24 DIAGNOSIS — I5023 Acute on chronic systolic (congestive) heart failure: Secondary | ICD-10-CM

## 2018-05-24 DIAGNOSIS — I428 Other cardiomyopathies: Secondary | ICD-10-CM

## 2018-05-24 DIAGNOSIS — N183 Chronic kidney disease, stage 3 (moderate): Secondary | ICD-10-CM

## 2018-05-24 DIAGNOSIS — I429 Cardiomyopathy, unspecified: Secondary | ICD-10-CM

## 2018-05-24 DIAGNOSIS — I5030 Unspecified diastolic (congestive) heart failure: Secondary | ICD-10-CM

## 2018-05-24 NOTE — Progress Notes (Signed)
Electrophysiology TeleHealth Note   Due to national recommendations of social distancing due to COVID 19, an audio/video telehealth visit is felt to be most appropriate for this patient at this time. The patient did not have access to video technology/had technical difficulties with video requiring transitioning to audio format only (telephone).  All issues noted in this document were discussed and addressed.  No physical exam could be performed with this format.     See MyChart message from today for the patient's consent to telehealth for [facility].   Date:  05/24/2018   ID:  Douglas Villa, DOB [birth year], MRN [id number]  Location: patient's home  Provider location: [address], [place]  Evaluation Performed: Follow-up visit  PCP:  Lillard Anes, MD  Cardiologist:   DM Electrophysiologist:  SK   Chief Complaint reconsideration of an ICD  History of Present Illness:    Douglas Villa is a 68 y.o. male who presents via audio/video conferencing for a telehealth visit today.  Since last being seen in our clinic for *consideration of an ICD the patient reports doing quite well  Denies chest pain.  No edema.  Some abdominal swelling.  Modest dyspnea.  No syncope or palpitations.  Telehealth visit with Dr. DM last week.  Recommended reconsideration of an ICD.  Patient has a number of concerns including cost and potential benefit   DATE TEST EF   3/19 LHC  25-35 % PLV occ Cx 80%>>DES  6/19 Echo   25-30 %  MR moderate-severe TR moderate-severe  1/20 Echo  20-25% LVH severe (19/15)   Date Cr K Hgb  3/19   12.5  8/19 2.11 4.1           9/19 Holter PVCs >> 2%    The patient denies symptoms of fevers, chills, cough, or new SOB worrisome for COVID 19.    Past Medical History:  Diagnosis Date  . Abnormal EKG 03/2009  . CAD (coronary artery disease)   . CKD (chronic kidney disease)   . Heart failure with preserved left ventricular  function (HFpEF) ([place])   . Hypertension   . OSA (obstructive sleep apnea)   . Sexual dysfunction     Past Surgical History:  Procedure Laterality Date  . APPENDECTOMY  1969  . CARDIAC CATHETERIZATION    . CORONARY ANGIOPLASTY      Current Outpatient Medications  Medication Sig Dispense Refill  . ACCU-CHEK FASTCLIX LANCETS MISC by Does not apply route.    Marland Kitchen albuterol (PROVENTIL HFA;VENTOLIN HFA) 108 (90 Base) MCG/ACT inhaler Inhale 2 puffs into the lungs every 6 (six) hours as needed for wheezing or shortness of breath. 1 Inhaler 2  . aspirin 81 MG tablet Take 1 tablet (81 mg total) by mouth daily. 90 tablet 3  . atorvastatin (LIPITOR) 80 MG tablet Take 1 tablet (80 mg total) by mouth daily. 90 tablet 3  . Blood Glucose Monitoring Suppl (ACCU-CHEK AVIVA PLUS) w/Device KIT by Does not apply route.    . carvedilol (COREG) 6.25 MG tablet Take 2 tablets (12.5 mg total) by mouth 2 (two) times daily. 90 tablet 6  . clopidogrel (PLAVIX) 75 MG tablet Take 1 tablet (75 mg total) by mouth daily. 90 tablet 3  . Colchicine 0.6 MG CAPS Take 0.6 mg by mouth daily. Take 0.6 (1 tab) TWICE A DAY FOR TWO DAYS, then 0.6 mg (1 tab) each day as needed for pain 60 capsule 0  . glucose  blood test strip 1 each by Other route as needed. Use as instructed    . hydrALAZINE (APRESOLINE) 25 MG tablet Take 1.5 tablets (37.5 mg total) by mouth 3 (three) times daily. 405 tablet 0  . isosorbide mononitrate (IMDUR) 30 MG 24 hr tablet Take 1 tablet (30 mg total) by mouth daily. 30 tablet 11  . Multiple Vitamin (MULTIVITAMIN) capsule Take 1 capsule by mouth daily.      . nitroGLYCERIN (NITROSTAT) 0.4 MG SL tablet Place 0.4 mg under the tongue every 5 (five) minutes as needed for chest pain.    . potassium chloride SA (K-DUR,KLOR-CON) 20 MEQ tablet Take 3 tablets (60 mEq total) by mouth daily. 90 tablet 3  . spironolactone (ALDACTONE) 25 MG tablet Take 1 tablet (25 mg total) by mouth daily. 30 tablet 6  . torsemide  (DEMADEX) 20 MG tablet Take 2 tablets (40 mg total) by mouth daily. Alternate 60 mg (3 tabs) and 40 mg (2 tabs) every other day 270 tablet 0  . umeclidinium-vilanterol (ANORO ELLIPTA) 62.5-25 MCG/INH AEPB Inhale 1 puff into the lungs daily. 60 each 5   No current facility-administered medications for this visit.     Allergies:   Penicillins   Social History:  The patient  reports that he has been smoking cigarettes. He has a 45.00 pack-year smoking history. He has never used smokeless tobacco. He reports that he does not drink alcohol or use drugs.   Family History:  The patient's   family history includes Cancer (age of onset: 79) in his mother; Stroke (age of onset: 6) in his father.   ROS:  Please see the history of present illness.   All other systems are personally reviewed and negative.    Exam:    Vital Signs:  BP (!) 147/90   Pulse 73   Ht _0  (1.651 m)   Wt 170 lb (77.1 kg)   BMI 28.29 kg/m         Labs/Other Tests and Data Reviewed:    Recent Labs: 08/19/2017: ALT 12 09/02/2017: B Natriuretic Peptide 1,943.8 02/09/2018: BUN 32; Creatinine, Ser 2.35; Potassium 3.7; Sodium 135   Wt Readings from Last 3 Encounters:  05/24/18 170 lb (77.1 kg)  01/30/18 174 lb (78.9 kg)  11/18/17 169 lb 9.6 oz (76.9 kg)     Other studies personally reviewed: Additional studies/ records that were reviewed today include: *As above  Review of the above records today demonstrates: As above  Prior radiographs:      ASSESSMENT & PLAN:    Cardiomyopathy ischemic/nonischemic  Coronary artery disease status post stenting  PVC  CHF class 2b  Chronic kidney disease class 3  GFR > 34   Continue aldactone, GFR >30 %   Have discussed with Dr DM about discordant LV thickness measurement  Will review echoes--may need evaluation for amyloid or eval HCM   Still not sure what he wants to do   We will await until clarification of diagnosis and when he next talked to Dr. DM  hopefully he will be able to make a decision regarding proceeding or not   COVID 19 screen The patient denies symptoms of COVID 19 at this time.  The importance of social distancing was discussed today.  Follow-up:  *prn     Current medicines are reviewed at length with the patient today.   The patient does not have concerns regarding his medicines.  The following changes were made today:  none  Labs/ tests ordered  today include: As above  No orders of the defined types were placed in this encounter.   Future tests ( post COVID )     Patient Risk:  after full review of this patients clinical status, I feel that they are at moderate  risk at this time.  Today, I have spent21* minutes with the patient with telehealth technology discussing the above.  Signed, Virl Axe, MD  05/24/2018 11:18 AM     [facility] [address] [place] [address] [phone] (office) [phone] (fax)

## 2018-05-24 NOTE — Telephone Encounter (Signed)
Called patient to arrange lab visit for myeloma panel.  Per MD, pt needs PYP scan as well. Pt will have labs drawn at his dr's office.  Rx faxed to his Dr. Gabriel Carina. Pt advised to call office if unable to do labs there. Pt advised will get call to schedule this appt.for PYP scan

## 2018-05-24 NOTE — Telephone Encounter (Signed)
-----   Message from Larey Dresser, MD sent at 05/24/2018 11:41 AM EDT ----- I sent an earlier message about doing an MRI on this patient.  Would not do that because creatinine has been up.  Would arrange for myeloma panel to be drawn and get PYP scan to look for TTR amyloidosis. Think he discussed this today with Dr. Zoe Lan.

## 2018-05-24 NOTE — Telephone Encounter (Signed)
error 

## 2018-11-13 IMAGING — DX DG CHEST 2V
2 series · 2 of 2 positions shown · non-contrast
Comparison: Radiograph May 27, 2017.

CLINICAL DATA: Shortness of breath.

EXAM:
CHEST - 2 VIEW

[chest pa]
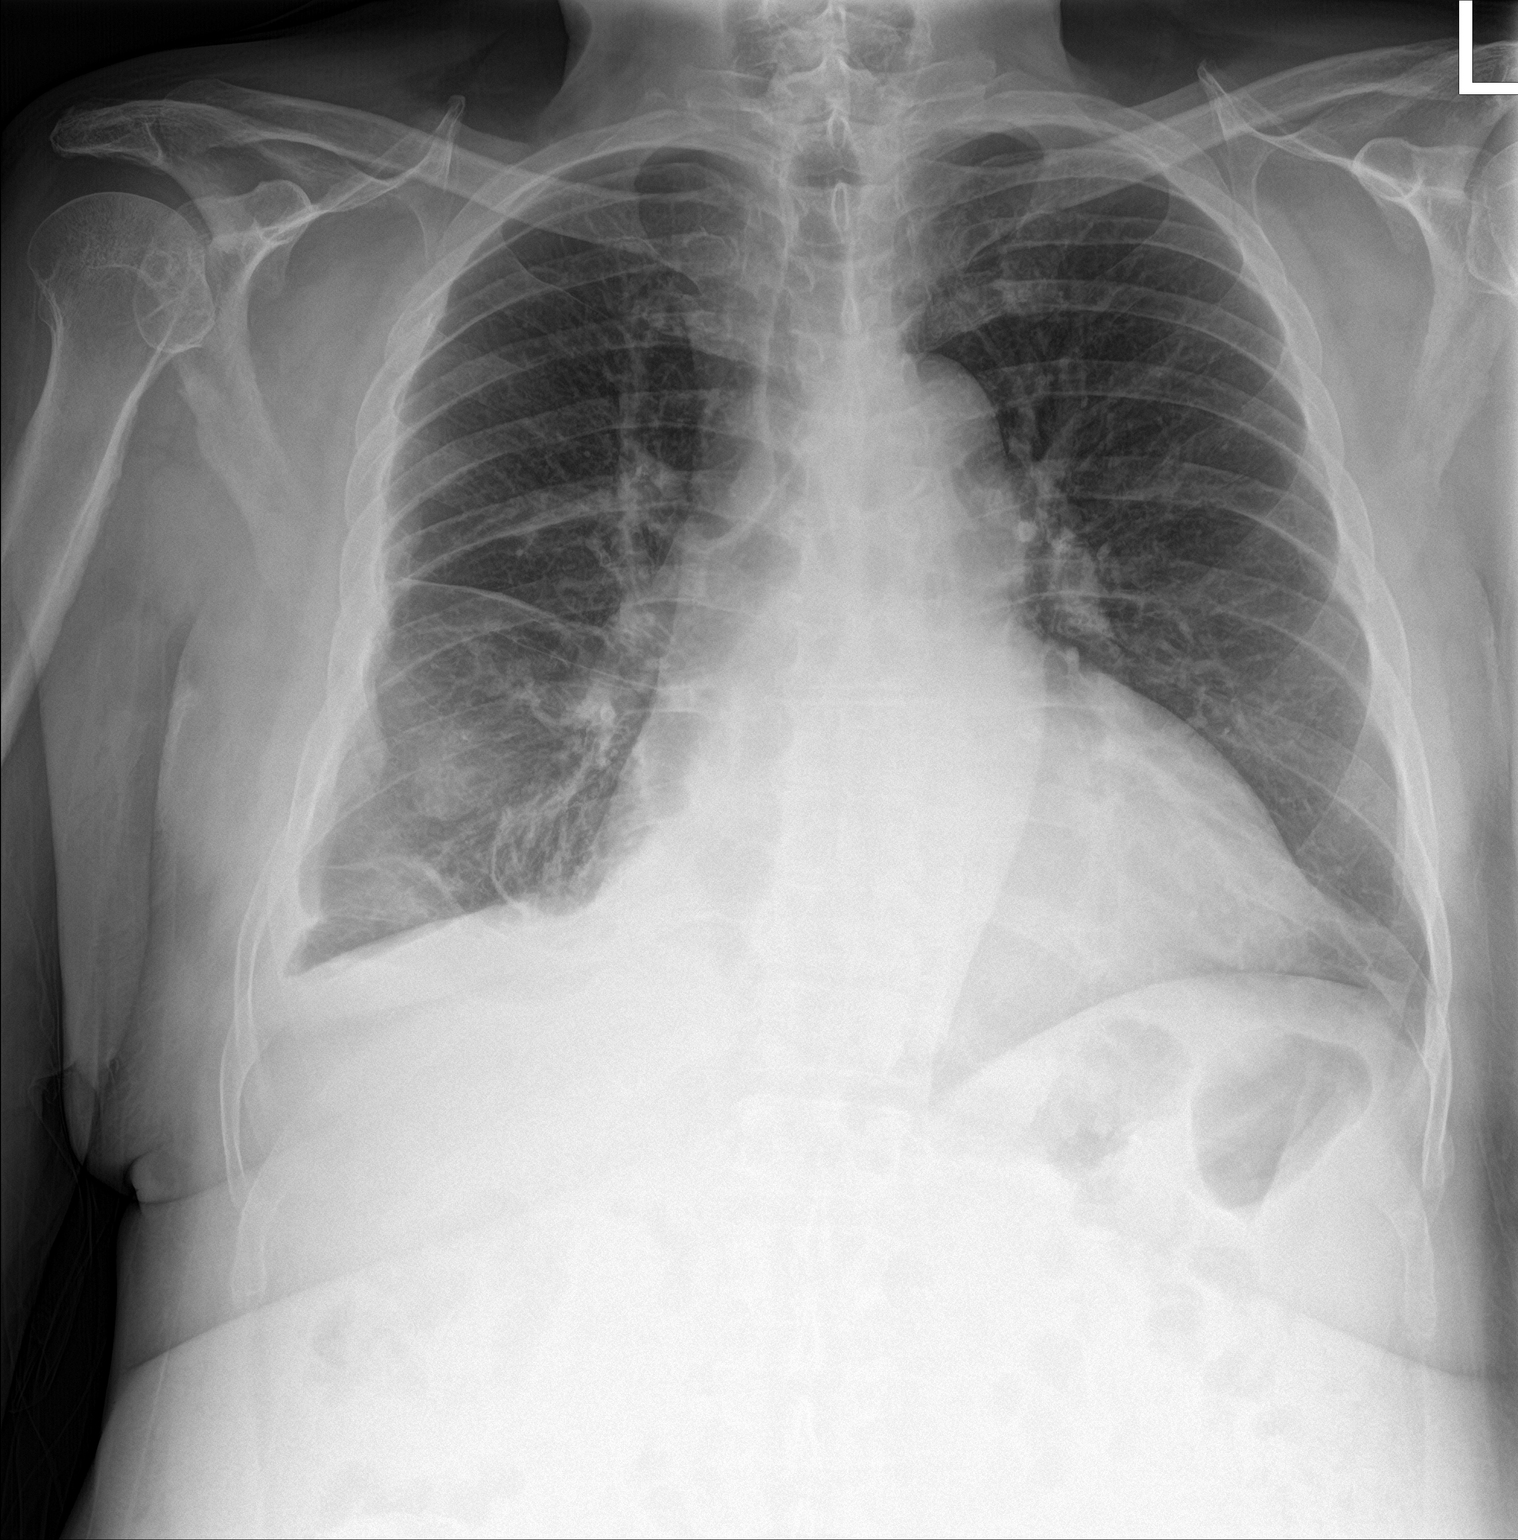

[chest lat]
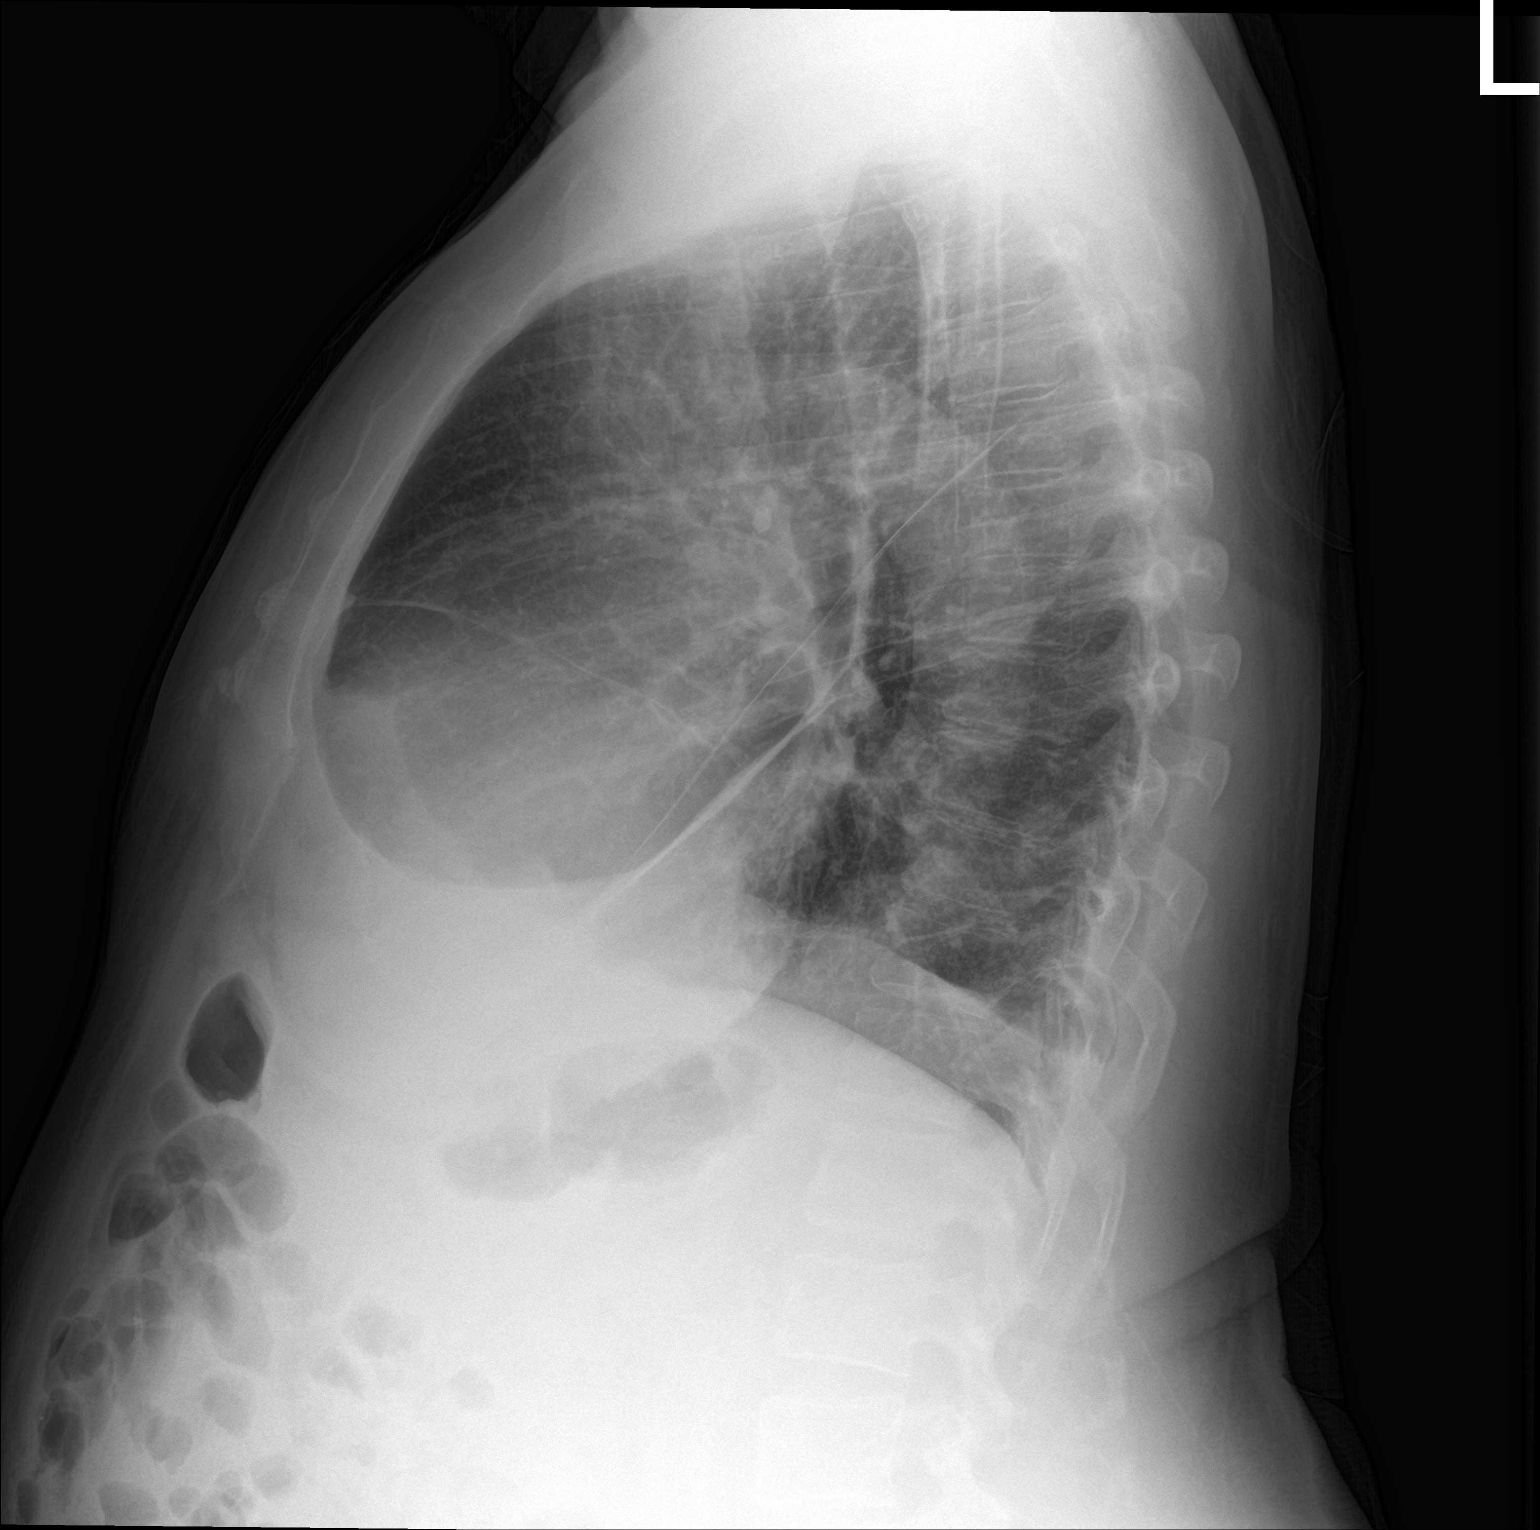

[2 of 2 positions shown; findings below may reference images not displayed]

FINDINGS: Stable cardiomegaly. No pneumothorax is noted. Mild right basilar
subsegmental atelectasis or scarring is noted. Small pleural
effusion is noted. Left lung is clear. Bony thorax is unremarkable.
IMPRESSION: Mild right basilar subsegmental atelectasis or scarring. Small right
pleural effusion is noted.

## 2018-12-19 IMAGING — DX DG CHEST 2V
2 series · 2 of 2 positions shown · non-contrast
Comparison: 07/13/2017, 07/02/2017, 06/30/2017 and 05/27/2017

CLINICAL DATA: Shortness of breath for 6 months.

EXAM:
CHEST - 2 VIEW

[chest pa]
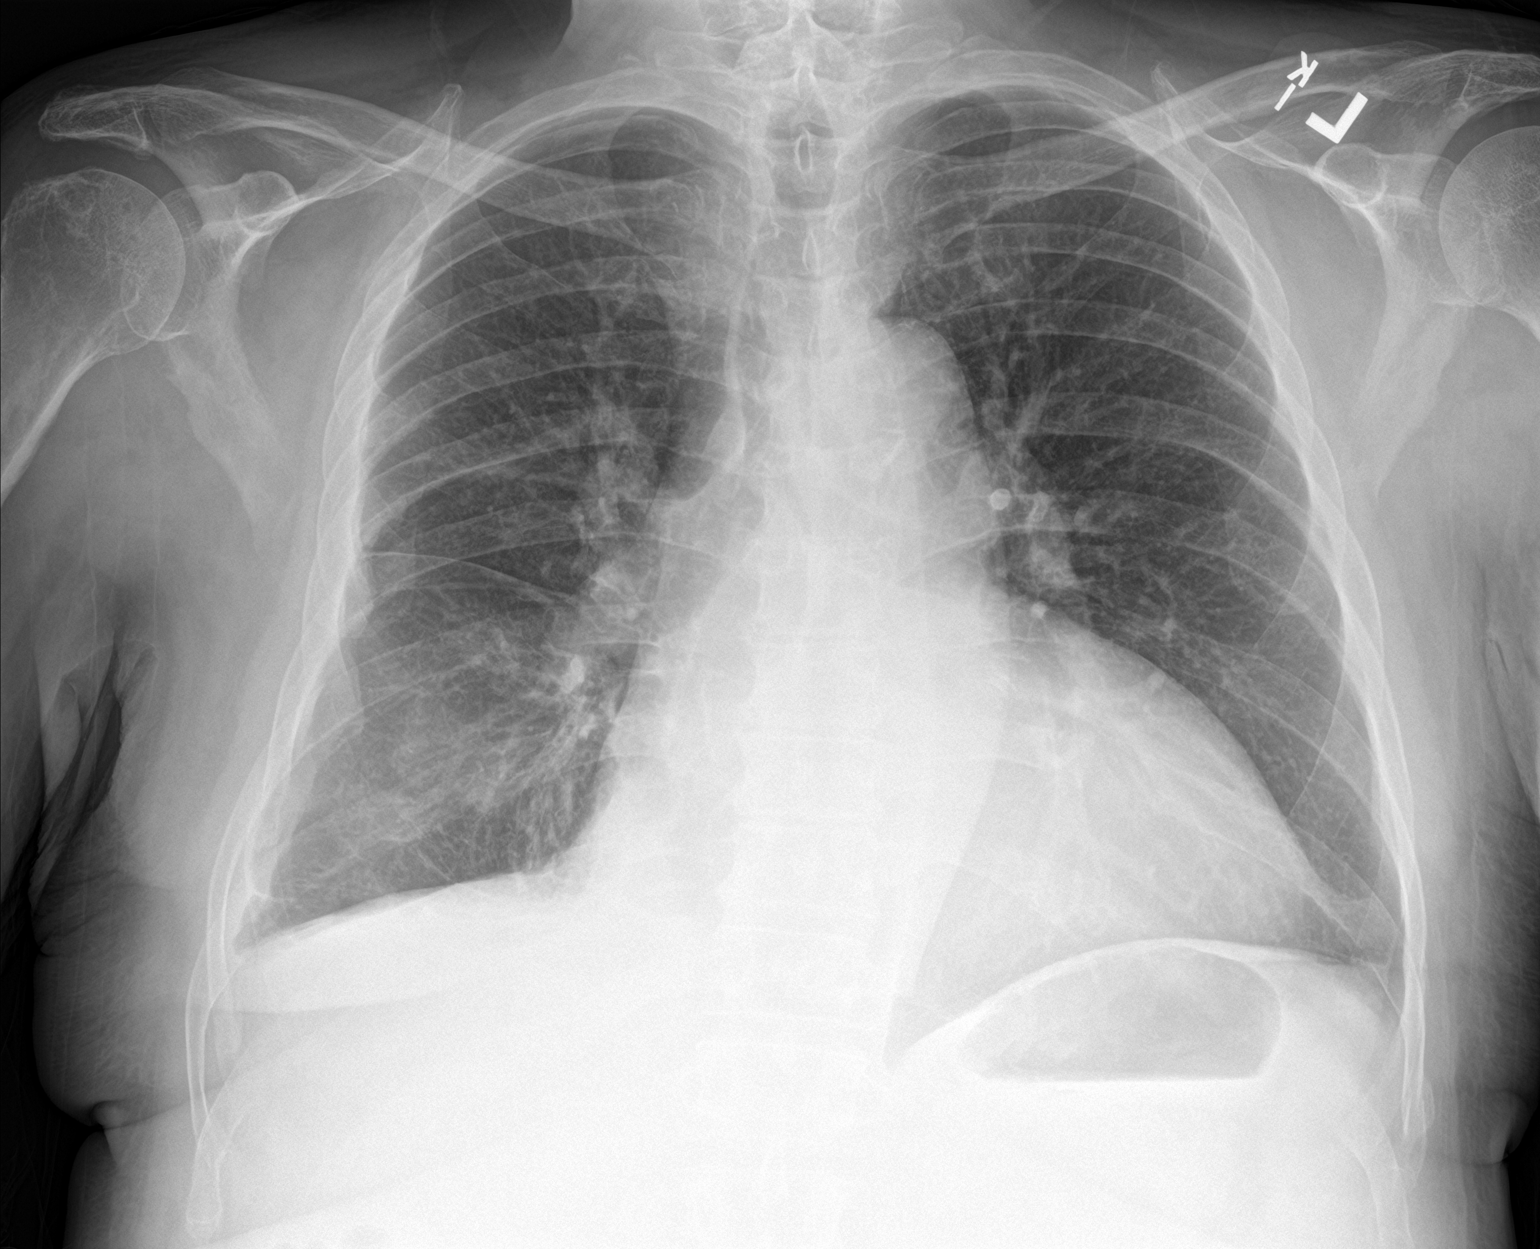

[chest lat]
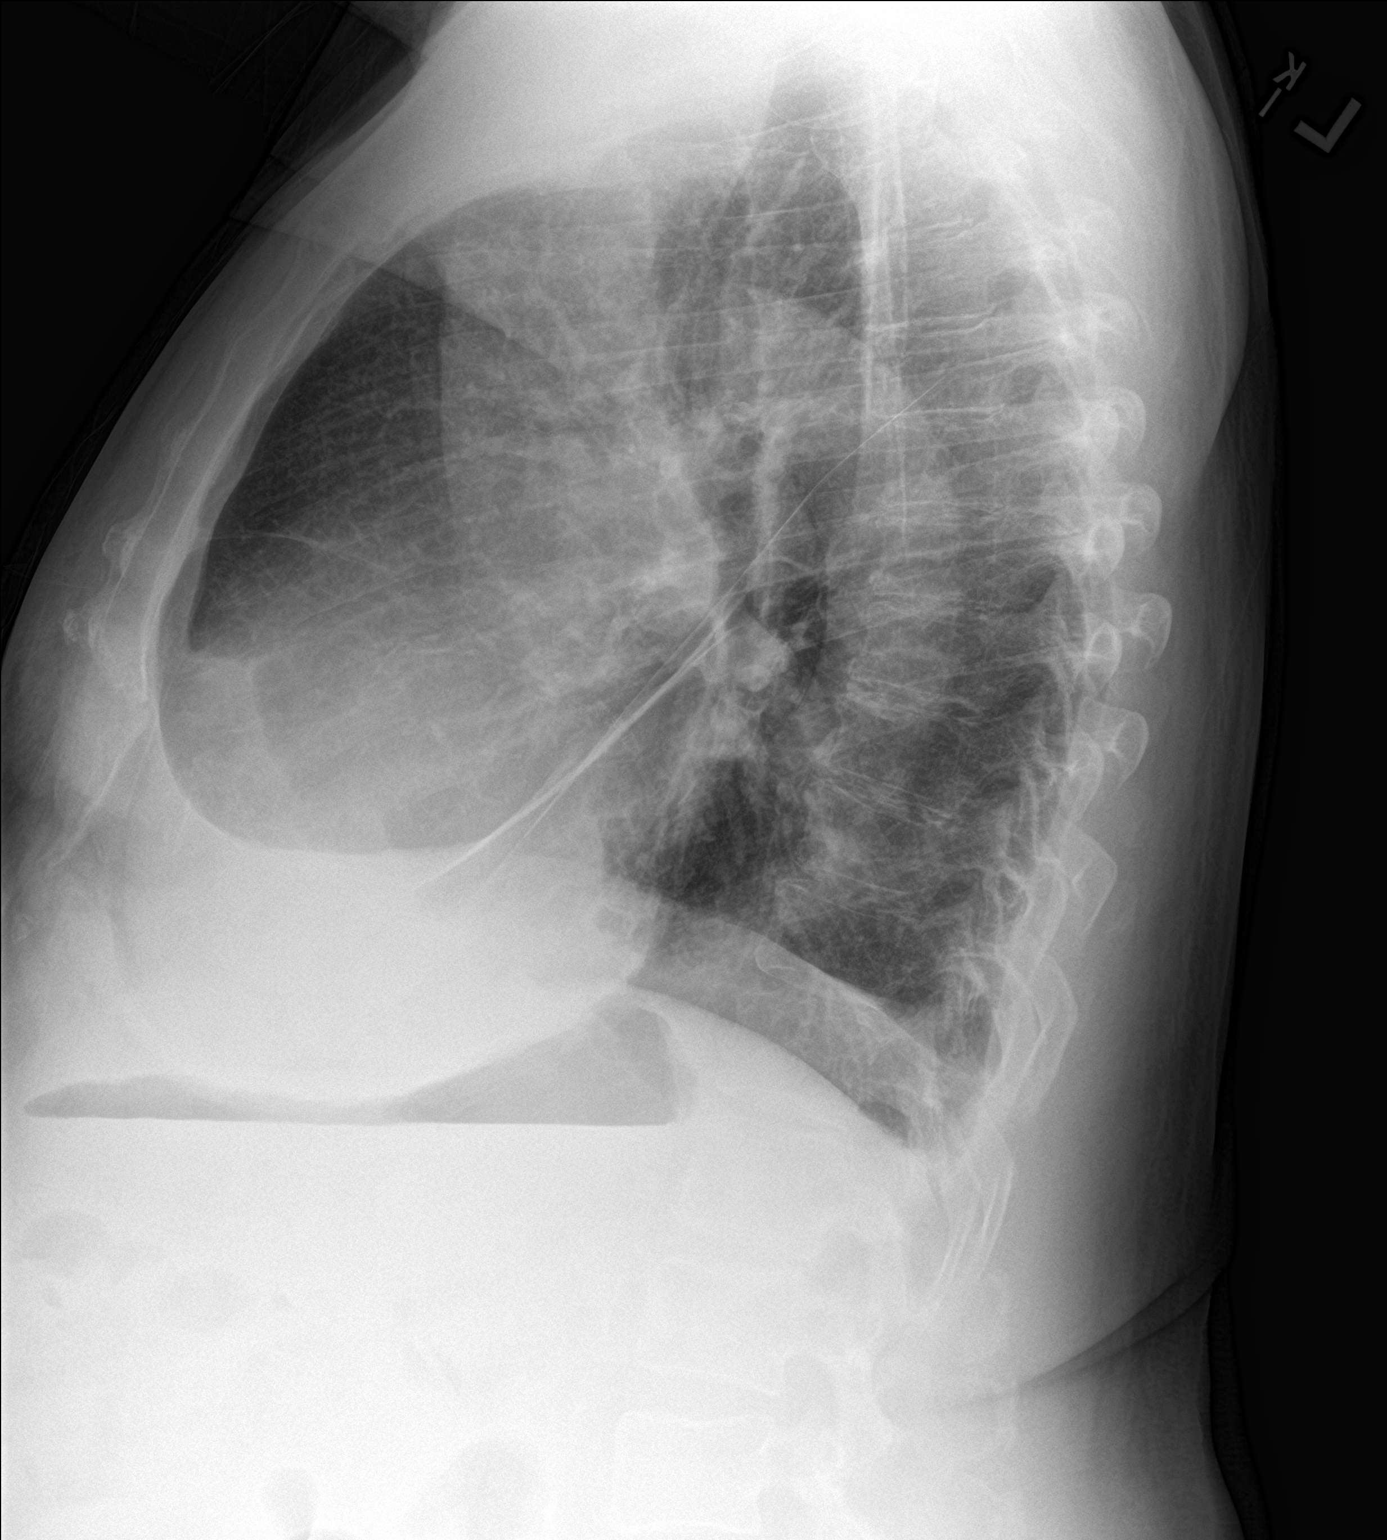

[2 of 2 positions shown; findings below may reference images not displayed]

FINDINGS: Chronic cardiomegaly. Pulmonary vascularity is normal. There are
multiple old right lateral rib fractures with adjacent pleural
thickening and chronic blunting of the right costophrenic angle. The
nodular appearing density at the right lung base is less prominent I
suspect this represents loculated pleural fluid. Left lung is clear.
Small pericardial fat pad at the left ventricular apex.
IMPRESSION: No significant change in the appearance of the chest. Multiple old
right lateral rib fractures with adjacent pleural thickening.

Chronic cardiomegaly.

## 2019-02-06 ENCOUNTER — Other Ambulatory Visit (HOSPITAL_COMMUNITY): Payer: Self-pay | Admitting: Cardiology

## 2019-02-21 ENCOUNTER — Other Ambulatory Visit (HOSPITAL_COMMUNITY): Payer: Self-pay

## 2019-02-21 MED ORDER — POTASSIUM CHLORIDE CRYS ER 20 MEQ PO TBCR: 60.0000 meq | EXTENDED_RELEASE_TABLET | Freq: Every day | ORAL | 1 refills | Status: DC

## 2019-06-01 ENCOUNTER — Other Ambulatory Visit (HOSPITAL_COMMUNITY): Payer: Self-pay | Admitting: Cardiology

## 2019-09-24 ENCOUNTER — Other Ambulatory Visit (HOSPITAL_COMMUNITY): Payer: Self-pay | Admitting: Cardiology

## 2020-01-04 ENCOUNTER — Other Ambulatory Visit (HOSPITAL_COMMUNITY): Payer: Self-pay | Admitting: Cardiology

## 2020-01-09 ENCOUNTER — Other Ambulatory Visit (HOSPITAL_COMMUNITY): Payer: Self-pay | Admitting: Cardiology

## 2020-02-11 ENCOUNTER — Other Ambulatory Visit (HOSPITAL_COMMUNITY): Payer: Self-pay | Admitting: Cardiology

## 2020-04-03 ENCOUNTER — Other Ambulatory Visit (HOSPITAL_COMMUNITY): Payer: Self-pay | Admitting: Cardiology

## 2020-06-18 ENCOUNTER — Encounter: Payer: Self-pay | Admitting: Legal Medicine

## 2020-06-18 ENCOUNTER — Other Ambulatory Visit: Payer: Self-pay

## 2020-06-18 ENCOUNTER — Ambulatory Visit (INDEPENDENT_AMBULATORY_CARE_PROVIDER_SITE_OTHER): Payer: Self-pay | Admitting: Legal Medicine

## 2020-06-18 VITALS — BP 100/80 | HR 84 | Temp 97.2°F | Resp 16 | Ht 65.0 in | Wt 195.0 lb

## 2020-06-18 DIAGNOSIS — E1121 Type 2 diabetes mellitus with diabetic nephropathy: Secondary | ICD-10-CM

## 2020-06-18 DIAGNOSIS — I5023 Acute on chronic systolic (congestive) heart failure: Secondary | ICD-10-CM

## 2020-06-18 DIAGNOSIS — N1832 Chronic kidney disease, stage 3b: Secondary | ICD-10-CM

## 2020-06-18 DIAGNOSIS — I361 Nonrheumatic tricuspid (valve) insufficiency: Secondary | ICD-10-CM

## 2020-06-18 DIAGNOSIS — I10 Essential (primary) hypertension: Secondary | ICD-10-CM

## 2020-06-18 DIAGNOSIS — C61 Malignant neoplasm of prostate: Secondary | ICD-10-CM

## 2020-06-18 DIAGNOSIS — I25110 Atherosclerotic heart disease of native coronary artery with unstable angina pectoris: Secondary | ICD-10-CM

## 2020-06-18 DIAGNOSIS — J449 Chronic obstructive pulmonary disease, unspecified: Secondary | ICD-10-CM

## 2020-06-18 DIAGNOSIS — E782 Mixed hyperlipidemia: Secondary | ICD-10-CM

## 2020-06-18 HISTORY — DX: Type 2 diabetes mellitus with diabetic nephropathy: E11.21

## 2020-06-18 NOTE — Progress Notes (Signed)
Subjective:  Patient ID: Douglas Villa, male    DOB: 1950-03-07  Age: 70 y.o. MRN: [id number]  Chief Complaint  Patient presents with  . Hyperlipidemia  . Hypertension    HPI: chronic visit- last seen in 2020  Patient presents for follow up of hypertension.  Patient tolerating carvedilol, hydralazine well with side effects.  Patient was diagnosed with hypertension 2010 so has been treated for hypertension for 10 years.Patient is working on maintaining diet and exercise regimen and follows up as directed. Complication include CHF, CAd.  Patient presents with hyperlipidemia.  Compliance with treatment has been good; patient takes medicines as directed, maintains low cholesterol diet, follows up as directed, and maintains exercise regimen.  Patient is using atorvastatin without problems.  Patient presents with diagnosis of COPD.  It is not secondary to prolonged asthma.  Diagnosis 20  Treatment includes nothing.  The diagnosis has not been hospitalized for this diagnosis. Last na.  Patient is noncompliant with regular use of medicines.  Patient presents with HFrEF 20-25%  that is stable. Diagnosis made 2020.  The course of the disease is unstable.  Current medicines include hydralazine, isordil, torsemide and spironolactone. Patient follows a low cholesterol diet and maintains a weight diary.  Patient is on low salt, low cholesterol diet and avoids alcohol.  Patient denies adverse effects of medicines. Patient is monitoring weight and has gained 25 lbs of weight.  Patient is having some pedal edema, some PND and some PND.  Patient is continuing to see cardiology.   Patient has prostate cancer 2015, no treTMENT OR FOLLOW UP Current Outpatient Medications on File Prior to Visit  Medication Sig Dispense Refill  . albuterol (PROVENTIL HFA;VENTOLIN HFA) 108 (90 Base) MCG/ACT inhaler Inhale 2 puffs into the lungs every 6 (six) hours as needed for wheezing or shortness of breath. 1 Inhaler 2  .  aspirin 81 MG tablet Take 1 tablet (81 mg total) by mouth daily. 90 tablet 3  . atorvastatin (LIPITOR) 80 MG tablet Take 1 tablet (80 mg total) by mouth daily. Needs appt for future refills 30 tablet 0  . carvedilol (COREG) 6.25 MG tablet Take 2 tablets by mouth twice daily 360 tablet 0  . clopidogrel (PLAVIX) 75 MG tablet TAKE 1 TABLET BY MOUTH ONCE DAILY . APPOINTMENT REQUIRED FOR FUTURE REFILLS 30 tablet 0  . hydrALAZINE (APRESOLINE) 25 MG tablet TAKE 1 & 1/2 (ONE & ONE-HALF) TABLETS BY MOUTH THREE TIMES DAILY. NEED AN APPOINTMENT FOR FUTURE REFILLS 135 tablet 0  . isosorbide mononitrate (IMDUR) 30 MG 24 hr tablet Take 1 tablet (30 mg total) by mouth daily. Needs appt for future refills 30 tablet 0  . nitroGLYCERIN (NITROSTAT) 0.4 MG SL tablet Place 0.4 mg under the tongue every 5 (five) minutes as needed for chest pain.    . potassium chloride SA (KLOR-CON) 20 MEQ tablet Take 3 tablets (60 mEq total) by mouth daily. Needs appt for future refills 90 tablet 0  . spironolactone (ALDACTONE) 25 MG tablet Take 1 tablet by mouth once daily 90 tablet 0  . torsemide (DEMADEX) 20 MG tablet Take 2 tabs every other day ALTERNATING with 3 tabs every other day, MUST HAVE APPT FOR FURTHER REFILLS 90 tablet 0  . ACCU-CHEK FASTCLIX LANCETS MISC by Does not apply route. (Patient not taking: Reported on 06/18/2020)    . Blood Glucose Monitoring Suppl (ACCU-CHEK AVIVA PLUS) w/Device KIT by Does not apply route. (Patient not taking: Reported on 06/18/2020)    .  glucose blood test strip 1 each by Other route as needed. Use as instructed (Patient not taking: Reported on 06/18/2020)    . umeclidinium-vilanterol (ANORO ELLIPTA) 62.5-25 MCG/INH AEPB Inhale 1 puff into the lungs daily. (Patient not taking: Reported on 06/18/2020) 60 each 5   No current facility-administered medications on file prior to visit.   Past Medical History:  Diagnosis Date  . Abnormal EKG 03/2009  . CAD (coronary artery disease)   . CKD (chronic  kidney disease)   . Heart failure with preserved left ventricular function (HFpEF) ([place])   . Hypertension   . OSA (obstructive sleep apnea)   . Sexual dysfunction   . Type 2 diabetes mellitus with diabetic nephropathy ([place]) 06/18/2020   Past Surgical History:  Procedure Laterality Date  . APPENDECTOMY  1969  . CARDIAC CATHETERIZATION    . CORONARY ANGIOPLASTY      Family History  Problem Relation Age of Onset  . Cancer Mother 48  . Stroke Father 69   Social History   Socioeconomic History  . Marital status: Single    Spouse name: Not on file  . Number of children: Not on file  . Years of education: Not on file  . Highest education level: Not on file  Occupational History  . Not on file  Tobacco Use  . Smoking status: Former Smoker    Packs/day: 1.00    Years: 45.00    Pack years: 45.00    Types: Cigarettes    Quit date: 2020    Years since quitting: 2.3  . Smokeless tobacco: Never Used  Vaping Use  . Vaping Use: Never used  Substance and Sexual Activity  . Alcohol use: No  . Drug use: No  . Sexual activity: Not Currently  Other Topics Concern  . Not on file  Social History Narrative  . Not on file   Social Determinants of Health   Financial Resource Strain: Not on file  Food Insecurity: Not on file  Transportation Needs: Not on file  Physical Activity: Not on file  Stress: Not on file  Social Connections: Not on file    Review of Systems  Constitutional: Positive for fatigue. Negative for activity change and appetite change.  HENT: Negative for congestion and sinus pain.   Eyes: Negative for visual disturbance.  Respiratory: Positive for cough, chest tightness and shortness of breath.   Cardiovascular: Positive for chest pain and leg swelling. Negative for palpitations.  Gastrointestinal: Negative for abdominal distention and abdominal pain.  Genitourinary: Negative for difficulty urinating and dysuria.  Musculoskeletal: Negative for arthralgias and  back pain.  Hematological: Negative.   Psychiatric/Behavioral: Negative.      Objective:  BP 100/80   Pulse 84   Temp (!) 97.2 F (36.2 C)   Resp 16   Ht _0  (1.651 m)   Wt 195 lb (88.5 kg)   SpO2 98%   BMI 32.45 kg/m   BP/Weight 06/18/2020 05/24/2018 56/38/7564  Systolic BP 332 951 884  Diastolic BP 80 90 88  Wt. (Lbs) 195 170 174  BMI 32.45 28.29 28.96    Physical Exam Vitals reviewed.  Constitutional:      Appearance: Normal appearance. He is obese.  HENT:     Head: Normocephalic and atraumatic.     Right Ear: Tympanic membrane normal.     Mouth/Throat:     Mouth: Mucous membranes are moist.     Pharynx: Oropharynx is clear.  Eyes:     Extraocular Movements:  Extraocular movements intact.     Conjunctiva/sclera: Conjunctivae normal.     Pupils: Pupils are equal, round, and reactive to light.  Cardiovascular:     Rate and Rhythm: Normal rate. Rhythm irregular.     Pulses: Normal pulses.     Heart sounds: No murmur heard. Gallop (s3) present.   Pulmonary:     Effort: No respiratory distress.     Comments: Dullness 3/4 up lung, no BS at bases. Abdominal:     General: Abdomen is flat. Bowel sounds are normal.     Palpations: Abdomen is soft.  Musculoskeletal:        General: Normal range of motion.     Cervical back: Normal range of motion and neck supple.     Right lower leg: Edema present.     Left lower leg: Edema present.  Skin:    General: Skin is warm and dry.     Capillary Refill: Capillary refill takes less than 2 seconds.  Neurological:     General: No focal deficit present.     Mental Status: He is alert and oriented to person, place, and time. Mental status is at baseline.     Diabetic Foot Exam - Simple   Simple Foot Form Diabetic Foot exam was performed with the following findings: Yes 06/18/2020  2:32 PM  Visual Inspection No deformities, no ulcerations, no other skin breakdown bilaterally: Yes Sensation Testing Intact to touch and  monofilament testing bilaterally: Yes Pulse Check Posterior Tibialis and Dorsalis pulse intact bilaterally: Yes Comments      Lab Results  Component Value Date   GLUCOSE 121 (H) 02/09/2018   CHOL 110 08/19/2017   TRIG 68 08/19/2017   HDL 44 08/19/2017   LDLCALC 52 08/19/2017   ALT 12 08/19/2017   AST 14 08/19/2017   NA 135 02/09/2018   K 3.7 02/09/2018   CL 101 02/09/2018   CREATININE 2.35 (H) 02/09/2018   BUN 32 (H) 02/09/2018   CO2 22 02/09/2018      Assessment & Plan:   Diagnoses and all orders for this visit: Acute on chronic systolic heart failure [facility]) An individualized care plan was established and reinforced.  The patient's disease status was assessed using clinical finding son exam today, labs, and/or other diagnostic testing such as x-rays, to determine the patient's success in meeting treatmentgoalsbased on disease-based guidelines and found to bedeteriorating. But not at goal yet. He has orthopnea and has to sleep standing.  Can walk a few step only. 25 lb weight gait Medications prescriptions no changes, transport to hosptal Laboratory tests ordered to be performed today include none. RECOMMENDATIONS: given include see cardiology.  Call physician is patient gains 3 lbs in one day or 5 lbs for one week.  Call for progressive PND, orthopnea or increased pedal edema.  Patient's ejection fraction in the past was between 20 and 25% and he did require dialysis at his last hospitalization.  Chronic obstructive pulmonary disease, unspecified COPD type ([place]) An individualize plan was formulated for care of COPD.  Treatment is evidence based.  She stopped on inhalers, avoid smoking and smoke.  Regular exercise with help with dyspnea. Routine follow ups and medication compliance is needed. He stopped mediicnes  Malignant neoplasm prostate [facility]) Patient has prostate cancer with no follow up for 2 years.  No symptoms at present  Type 2 diabetes mellitus with diabetic  nephropathy, without long-term current use of insulin ([place]) Patient has stopped all diabetes medicines at present.  Atherosclerosis of native coronary artery of native heart with unstable angina pectoris ([place])  patient has been having precordial chest pain without radiation but associated with shortness of breath and sweats al hypertension he has no nitroglycerin but has a history of coronary artery disease and has not been followed up for quite some time.  His EKG does show lateral ischemic changes.  Mixed hyperlipidemia AN INDIVIDUAL CARE PLAN for hyperlipidemia/ cholesterol was established and reinforced today.  The patient's status was assessed using clinical findings on exam, lab and other diagnostic tests. The patient's disease status was assessed based on evidence-based guidelines and found to be fair controlled. MEDICATIONS were reviewed. SELF MANAGEMENT GOALS have been discussed and patient's success at attaining the goal of low cholesterol was assessed. RECOMMENDATION given include regular exercise 3 days a week and low cholesterol/low fat diet. CLINICAL SUMMARY including written plan to identify barriers unique to the patient due to social or economic  reasons was discussed.  Stage 3b chronic kidney disease ([place]) Patient's last kidney test was stage IIIb and he has not had it tested recently.  He will need to check up in the hospital.   Patient is in acute distress from congestive heart failure gaining 25 pounds and being extremely dyspneic with positive orthopnea and PND he has 3+ pitting edema in the ankles he does have an S3 gallop on examination we will call paramedics and transfer to [facility] where his cardiologist is he will need diuresis and stabilization.  He will also need checking for further cardiac injury.    admit to hospital- CHF 17mnute visit  Follow-up: No follow-ups on file.  An After Visit Summary was printed and given to the patient.  LReinaldo Meeker MD [facility] ([phone]

## 2020-06-18 NOTE — Assessment & Plan Note (Signed)
EF 20- 25% per Dr. Caryl Comes 2D echo in 2022

## 2020-06-20 ENCOUNTER — Inpatient Hospital Stay (HOSPITAL_COMMUNITY)
Admission: AC | Admit: 2020-06-20 | Discharge: 2020-06-29 | DRG: 270 | Disposition: A | Discharge status: Home / Self Care | Payer: Medicare Other | Source: Other Acute Inpatient Hospital | Attending: Cardiovascular Disease | Admitting: Cardiovascular Disease

## 2020-06-20 ENCOUNTER — Encounter (HOSPITAL_COMMUNITY): Payer: Self-pay | Admitting: Cardiovascular Disease

## 2020-06-20 ENCOUNTER — Inpatient Hospital Stay (HOSPITAL_COMMUNITY): Payer: Medicare Other

## 2020-06-20 ENCOUNTER — Ambulatory Visit (HOSPITAL_COMMUNITY): Payer: Medicare Other

## 2020-06-20 ENCOUNTER — Inpatient Hospital Stay (HOSPITAL_COMMUNITY)
Admission: AC | Disposition: A | Discharge status: Home / Self Care | Payer: Self-pay | Source: Other Acute Inpatient Hospital | Attending: Cardiovascular Disease

## 2020-06-20 ENCOUNTER — Other Ambulatory Visit: Payer: Self-pay

## 2020-06-20 DIAGNOSIS — E782 Mixed hyperlipidemia: Secondary | ICD-10-CM | POA: Diagnosis present

## 2020-06-20 DIAGNOSIS — Z20822 Contact with and (suspected) exposure to covid-19: Secondary | ICD-10-CM | POA: Diagnosis present

## 2020-06-20 DIAGNOSIS — G4733 Obstructive sleep apnea (adult) (pediatric): Secondary | ICD-10-CM | POA: Diagnosis present

## 2020-06-20 DIAGNOSIS — I130 Hypertensive heart and chronic kidney disease with heart failure and stage 1 through stage 4 chronic kidney disease, or unspecified chronic kidney disease: Secondary | ICD-10-CM | POA: Diagnosis present

## 2020-06-20 DIAGNOSIS — I2102 ST elevation (STEMI) myocardial infarction involving left anterior descending coronary artery: Principal | ICD-10-CM | POA: Diagnosis present

## 2020-06-20 DIAGNOSIS — Z7902 Long term (current) use of antithrombotics/antiplatelets: Secondary | ICD-10-CM | POA: Diagnosis not present

## 2020-06-20 DIAGNOSIS — I493 Ventricular premature depolarization: Secondary | ICD-10-CM | POA: Diagnosis present

## 2020-06-20 DIAGNOSIS — I1310 Hypertensive heart and chronic kidney disease without heart failure, with stage 1 through stage 4 chronic kidney disease, or unspecified chronic kidney disease: Secondary | ICD-10-CM

## 2020-06-20 DIAGNOSIS — Z880 Allergy status to penicillin: Secondary | ICD-10-CM

## 2020-06-20 DIAGNOSIS — D696 Thrombocytopenia, unspecified: Secondary | ICD-10-CM

## 2020-06-20 DIAGNOSIS — R570 Cardiogenic shock: Secondary | ICD-10-CM | POA: Diagnosis present

## 2020-06-20 DIAGNOSIS — E785 Hyperlipidemia, unspecified: Secondary | ICD-10-CM | POA: Diagnosis present

## 2020-06-20 DIAGNOSIS — I2510 Atherosclerotic heart disease of native coronary artery without angina pectoris: Secondary | ICD-10-CM | POA: Diagnosis present

## 2020-06-20 DIAGNOSIS — Z7982 Long term (current) use of aspirin: Secondary | ICD-10-CM | POA: Diagnosis not present

## 2020-06-20 DIAGNOSIS — E1122 Type 2 diabetes mellitus with diabetic chronic kidney disease: Secondary | ICD-10-CM | POA: Diagnosis present

## 2020-06-20 DIAGNOSIS — J449 Chronic obstructive pulmonary disease, unspecified: Secondary | ICD-10-CM | POA: Diagnosis present

## 2020-06-20 DIAGNOSIS — I484 Atypical atrial flutter: Secondary | ICD-10-CM | POA: Diagnosis present

## 2020-06-20 DIAGNOSIS — I213 ST elevation (STEMI) myocardial infarction of unspecified site: Secondary | ICD-10-CM

## 2020-06-20 DIAGNOSIS — Z9861 Coronary angioplasty status: Secondary | ICD-10-CM

## 2020-06-20 DIAGNOSIS — I5022 Chronic systolic (congestive) heart failure: Secondary | ICD-10-CM

## 2020-06-20 DIAGNOSIS — E871 Hypo-osmolality and hyponatremia: Secondary | ICD-10-CM | POA: Diagnosis not present

## 2020-06-20 DIAGNOSIS — Z9049 Acquired absence of other specified parts of digestive tract: Secondary | ICD-10-CM | POA: Diagnosis not present

## 2020-06-20 DIAGNOSIS — Z79899 Other long term (current) drug therapy: Secondary | ICD-10-CM | POA: Diagnosis not present

## 2020-06-20 DIAGNOSIS — Z87891 Personal history of nicotine dependence: Secondary | ICD-10-CM

## 2020-06-20 DIAGNOSIS — D62 Acute posthemorrhagic anemia: Secondary | ICD-10-CM | POA: Diagnosis not present

## 2020-06-20 DIAGNOSIS — I232 Ventricular septal defect as current complication following acute myocardial infarction: Secondary | ICD-10-CM

## 2020-06-20 DIAGNOSIS — I5043 Acute on chronic combined systolic (congestive) and diastolic (congestive) heart failure: Secondary | ICD-10-CM | POA: Diagnosis present

## 2020-06-20 DIAGNOSIS — N183 Chronic kidney disease, stage 3 unspecified: Secondary | ICD-10-CM | POA: Diagnosis present

## 2020-06-20 DIAGNOSIS — E1121 Type 2 diabetes mellitus with diabetic nephropathy: Secondary | ICD-10-CM | POA: Diagnosis present

## 2020-06-20 DIAGNOSIS — I083 Combined rheumatic disorders of mitral, aortic and tricuspid valves: Secondary | ICD-10-CM | POA: Diagnosis present

## 2020-06-20 DIAGNOSIS — D649 Anemia, unspecified: Secondary | ICD-10-CM

## 2020-06-20 DIAGNOSIS — Z955 Presence of coronary angioplasty implant and graft: Secondary | ICD-10-CM

## 2020-06-20 DIAGNOSIS — N184 Chronic kidney disease, stage 4 (severe): Secondary | ICD-10-CM | POA: Diagnosis present

## 2020-06-20 DIAGNOSIS — I4891 Unspecified atrial fibrillation: Secondary | ICD-10-CM | POA: Diagnosis not present

## 2020-06-20 DIAGNOSIS — I252 Old myocardial infarction: Secondary | ICD-10-CM

## 2020-06-20 DIAGNOSIS — E1169 Type 2 diabetes mellitus with other specified complication: Secondary | ICD-10-CM

## 2020-06-20 DIAGNOSIS — I5082 Biventricular heart failure: Secondary | ICD-10-CM | POA: Diagnosis present

## 2020-06-20 DIAGNOSIS — Z452 Encounter for adjustment and management of vascular access device: Secondary | ICD-10-CM

## 2020-06-20 DIAGNOSIS — I5023 Acute on chronic systolic (congestive) heart failure: Secondary | ICD-10-CM | POA: Diagnosis present

## 2020-06-20 DIAGNOSIS — Z9119 Patient's noncompliance with other medical treatment and regimen: Secondary | ICD-10-CM

## 2020-06-20 DIAGNOSIS — I071 Rheumatic tricuspid insufficiency: Secondary | ICD-10-CM

## 2020-06-20 HISTORY — PX: RIGHT/LEFT HEART CATH AND CORONARY ANGIOGRAPHY: CATH118266

## 2020-06-20 HISTORY — PX: CENTRAL LINE INSERTION: CATH118232

## 2020-06-20 HISTORY — PX: CORONARY/GRAFT ACUTE MI REVASCULARIZATION: CATH118305

## 2020-06-20 HISTORY — PX: IABP INSERTION: CATH118242

## 2020-06-20 HISTORY — PX: ARTERIAL LINE INSERTION: CATH118227

## 2020-06-20 LAB — POCT I-STAT EG7
Acid-Base Excess: 2 mmol/L (ref 0.0–2.0)
Acid-Base Excess: 2 mmol/L (ref 0.0–2.0)
Acid-Base Excess: 2 mmol/L (ref 0.0–2.0)
Acid-Base Excess: 7 mmol/L — ABNORMAL HIGH (ref 0.0–2.0)
Acid-Base Excess: 7 mmol/L — ABNORMAL HIGH (ref 0.0–2.0)
Acid-Base Excess: 5 mmol/L — ABNORMAL HIGH (ref 0.0–2.0)
Bicarbonate: 28.5 mmol/L — ABNORMAL HIGH (ref 20.0–28.0)
Bicarbonate: 28.7 mmol/L — ABNORMAL HIGH (ref 20.0–28.0)
Bicarbonate: 28.8 mmol/L — ABNORMAL HIGH (ref 20.0–28.0)
Bicarbonate: 33 mmol/L — ABNORMAL HIGH (ref 20.0–28.0)
Bicarbonate: 33.1 mmol/L — ABNORMAL HIGH (ref 20.0–28.0)
Bicarbonate: 30.5 mmol/L — ABNORMAL HIGH (ref 20.0–28.0)
Calcium, Ion: 1.08 mmol/L — ABNORMAL LOW (ref 1.15–1.40)
Calcium, Ion: 1.08 mmol/L — ABNORMAL LOW (ref 1.15–1.40)
Calcium, Ion: 1.12 mmol/L — ABNORMAL LOW (ref 1.15–1.40)
Calcium, Ion: 1.14 mmol/L — ABNORMAL LOW (ref 1.15–1.40)
Calcium, Ion: 1.15 mmol/L (ref 1.15–1.40)
Calcium, Ion: 1.08 mmol/L — ABNORMAL LOW (ref 1.15–1.40)
HCT: 42 % (ref 39.0–52.0)
HCT: 43 % (ref 39.0–52.0)
HCT: 43 % (ref 39.0–52.0)
HCT: 43 % (ref 39.0–52.0)
HCT: 43 % (ref 39.0–52.0)
HCT: 41 % (ref 39.0–52.0)
Hemoglobin: 14.3 g/dL (ref 13.0–17.0)
Hemoglobin: 14.6 g/dL (ref 13.0–17.0)
Hemoglobin: 14.6 g/dL (ref 13.0–17.0)
Hemoglobin: 14.6 g/dL (ref 13.0–17.0)
Hemoglobin: 14.6 g/dL (ref 13.0–17.0)
Hemoglobin: 13.9 g/dL (ref 13.0–17.0)
O2 Saturation: 29 %
O2 Saturation: 30 %
O2 Saturation: 35 %
O2 Saturation: 36 %
O2 Saturation: 37 %
O2 Saturation: 41 %
Potassium: 3.8 mmol/L (ref 3.5–5.1)
Potassium: 3.8 mmol/L (ref 3.5–5.1)
Potassium: 4 mmol/L (ref 3.5–5.1)
Potassium: 4 mmol/L (ref 3.5–5.1)
Potassium: 4 mmol/L (ref 3.5–5.1)
Potassium: 3.7 mmol/L (ref 3.5–5.1)
Sodium: 137 mmol/L (ref 135–145)
Sodium: 137 mmol/L (ref 135–145)
Sodium: 138 mmol/L (ref 135–145)
Sodium: 139 mmol/L (ref 135–145)
Sodium: 139 mmol/L (ref 135–145)
Sodium: 138 mmol/L (ref 135–145)
TCO2: 30 mmol/L (ref 22–32)
TCO2: 30 mmol/L (ref 22–32)
TCO2: 30 mmol/L (ref 22–32)
TCO2: 35 mmol/L — ABNORMAL HIGH (ref 22–32)
TCO2: 35 mmol/L — ABNORMAL HIGH (ref 22–32)
TCO2: 32 mmol/L (ref 22–32)
pCO2, Ven: 51.9 mmHg (ref 44.0–60.0)
pCO2, Ven: 51.9 mmHg (ref 44.0–60.0)
pCO2, Ven: 52 mmHg (ref 44.0–60.0)
pCO2, Ven: 52.2 mmHg (ref 44.0–60.0)
pCO2, Ven: 53.1 mmHg (ref 44.0–60.0)
pCO2, Ven: 49.1 mmHg (ref 44.0–60.0)
pH, Ven: 7.348 (ref 7.250–7.430)
pH, Ven: 7.349 (ref 7.250–7.430)
pH, Ven: 7.351 (ref 7.250–7.430)
pH, Ven: 7.401 (ref 7.250–7.430)
pH, Ven: 7.41 (ref 7.250–7.430)
pH, Ven: 7.402 (ref 7.250–7.430)
pO2, Ven: 20 mmHg — CL (ref 32.0–45.0)
pO2, Ven: 21 mmHg — CL (ref 32.0–45.0)
pO2, Ven: 22 mmHg — CL (ref 32.0–45.0)
pO2, Ven: 22 mmHg — CL (ref 32.0–45.0)
pO2, Ven: 22 mmHg — CL (ref 32.0–45.0)
pO2, Ven: 24 mmHg — CL (ref 32.0–45.0)

## 2020-06-20 LAB — CBC
HCT: 35.7 % — ABNORMAL LOW (ref 39.0–52.0)
HCT: 36.8 % — ABNORMAL LOW (ref 39.0–52.0)
Hemoglobin: 11.5 g/dL — ABNORMAL LOW (ref 13.0–17.0)
Hemoglobin: 11.9 g/dL — ABNORMAL LOW (ref 13.0–17.0)
MCH: 27.4 pg (ref 26.0–34.0)
MCH: 27.2 pg (ref 26.0–34.0)
MCHC: 32.2 g/dL (ref 30.0–36.0)
MCHC: 32.3 g/dL (ref 30.0–36.0)
MCV: 85.2 fL (ref 80.0–100.0)
MCV: 84 fL (ref 80.0–100.0)
Platelets: 141 10*3/uL — ABNORMAL LOW (ref 150–400)
Platelets: 149 10*3/uL — ABNORMAL LOW (ref 150–400)
RBC: 4.19 MIL/uL — ABNORMAL LOW (ref 4.22–5.81)
RBC: 4.38 MIL/uL (ref 4.22–5.81)
RDW: 15.1 % (ref 11.5–15.5)
RDW: 14.9 % (ref 11.5–15.5)
WBC: 8.4 10*3/uL (ref 4.0–10.5)
WBC: 9 10*3/uL (ref 4.0–10.5)
nRBC: 0 % (ref 0.0–0.2)
nRBC: 0 % (ref 0.0–0.2)

## 2020-06-20 LAB — BASIC METABOLIC PANEL
Anion gap: 8 (ref 5–15)
Anion gap: 10 (ref 5–15)
Anion gap: 9 (ref 5–15)
BUN: 47 mg/dL — ABNORMAL HIGH (ref 8–23)
BUN: 44 mg/dL — ABNORMAL HIGH (ref 8–23)
BUN: 43 mg/dL — ABNORMAL HIGH (ref 8–23)
CO2: 28 mmol/L (ref 22–32)
CO2: 26 mmol/L (ref 22–32)
CO2: 28 mmol/L (ref 22–32)
Calcium: 8.3 mg/dL — ABNORMAL LOW (ref 8.9–10.3)
Calcium: 8.4 mg/dL — ABNORMAL LOW (ref 8.9–10.3)
Calcium: 8.3 mg/dL — ABNORMAL LOW (ref 8.9–10.3)
Chloride: 98 mmol/L (ref 98–111)
Chloride: 96 mmol/L — ABNORMAL LOW (ref 98–111)
Chloride: 97 mmol/L — ABNORMAL LOW (ref 98–111)
Creatinine, Ser: 2.25 mg/dL — ABNORMAL HIGH (ref 0.61–1.24)
Creatinine, Ser: 2.27 mg/dL — ABNORMAL HIGH (ref 0.61–1.24)
Creatinine, Ser: 2.16 mg/dL — ABNORMAL HIGH (ref 0.61–1.24)
GFR, Estimated: 30 mL/min — ABNORMAL LOW (ref >60)
GFR, Estimated: 30 mL/min — ABNORMAL LOW (ref >60)
GFR, Estimated: 32 mL/min — ABNORMAL LOW (ref >60)
Glucose, Bld: 226 mg/dL — ABNORMAL HIGH (ref 70–99)
Glucose, Bld: 203 mg/dL — ABNORMAL HIGH (ref 70–99)
Glucose, Bld: 138 mg/dL — ABNORMAL HIGH (ref 70–99)
Potassium: 3.3 mmol/L — ABNORMAL LOW (ref 3.5–5.1)
Potassium: 3 mmol/L — ABNORMAL LOW (ref 3.5–5.1)
Potassium: 3.5 mmol/L (ref 3.5–5.1)
Sodium: 134 mmol/L — ABNORMAL LOW (ref 135–145)
Sodium: 132 mmol/L — ABNORMAL LOW (ref 135–145)
Sodium: 134 mmol/L — ABNORMAL LOW (ref 135–145)

## 2020-06-20 LAB — COOXEMETRY PANEL
Carboxyhemoglobin: 1 % (ref 0.5–1.5)
Carboxyhemoglobin: 1.2 % (ref 0.5–1.5)
Methemoglobin: 0.9 % (ref 0.0–1.5)
Methemoglobin: 0.7 % (ref 0.0–1.5)
O2 Saturation: 47.5 %
O2 Saturation: 61.5 %
Total hemoglobin: 11.7 g/dL — ABNORMAL LOW (ref 12.0–16.0)
Total hemoglobin: 12 g/dL (ref 12.0–16.0)

## 2020-06-20 LAB — POCT I-STAT, CHEM 8
BUN: 45 mg/dL — ABNORMAL HIGH (ref 8–23)
Calcium, Ion: 1.15 mmol/L (ref 1.15–1.40)
Chloride: 97 mmol/L — ABNORMAL LOW (ref 98–111)
Creatinine, Ser: 2.1 mg/dL — ABNORMAL HIGH (ref 0.61–1.24)
Glucose, Bld: 260 mg/dL — ABNORMAL HIGH (ref 70–99)
HCT: 43 % (ref 39.0–52.0)
Hemoglobin: 14.6 g/dL (ref 13.0–17.0)
Potassium: 4 mmol/L (ref 3.5–5.1)
Sodium: 137 mmol/L (ref 135–145)
TCO2: 25 mmol/L (ref 22–32)

## 2020-06-20 LAB — POCT ACTIVATED CLOTTING TIME
Activated Clotting Time: 291 seconds
Activated Clotting Time: 761 seconds
Activated Clotting Time: 190 seconds
Activated Clotting Time: 172 seconds

## 2020-06-20 LAB — HEMOGLOBIN A1C
Hgb A1c MFr Bld: 7.4 % — ABNORMAL HIGH (ref 4.8–5.6)
Mean Plasma Glucose: 165.68 mg/dL

## 2020-06-20 LAB — TROPONIN I (HIGH SENSITIVITY)
Troponin I (High Sensitivity): >27000 ng/L (ref <18)
Troponin I (High Sensitivity): >27000 ng/L (ref <18)

## 2020-06-20 LAB — GLUCOSE, CAPILLARY
Glucose-Capillary: 168 mg/dL — ABNORMAL HIGH (ref 70–99)
Glucose-Capillary: 133 mg/dL — ABNORMAL HIGH (ref 70–99)

## 2020-06-20 LAB — MRSA PCR SCREENING: MRSA by PCR: NEGATIVE

## 2020-06-20 LAB — LACTIC ACID, PLASMA
Lactic Acid, Venous: 2 mmol/L (ref 0.5–1.9)
Lactic Acid, Venous: 1.3 mmol/L (ref 0.5–1.9)

## 2020-06-20 LAB — MAGNESIUM: Magnesium: 2.4 mg/dL (ref 1.7–2.4)

## 2020-06-20 LAB — SARS CORONAVIRUS 2 BY RT PCR (HOSPITAL ORDER, PERFORMED IN ~~LOC~~ HOSPITAL LAB): SARS Coronavirus 2: NEGATIVE

## 2020-06-20 SURGERY — RIGHT/LEFT HEART CATH AND CORONARY ANGIOGRAPHY
Anesthesia: LOCAL

## 2020-06-20 MED ORDER — MIDAZOLAM HCL 2 MG/2ML IJ SOLN
INTRAMUSCULAR | Status: DC | PRN
  Administered 2020-06-20: 1 mg via INTRAVENOUS

## 2020-06-20 MED ORDER — AMIODARONE HCL IN DEXTROSE 360-4.14 MG/200ML-% IV SOLN: 30.0000 mg/h | INTRAVENOUS | Status: DC

## 2020-06-20 MED ORDER — SODIUM CHLORIDE 0.9% FLUSH: 3.0000 mL | INTRAVENOUS | Status: DC | PRN

## 2020-06-20 MED ORDER — NOREPINEPHRINE BITARTRATE 1 MG/ML IV SOLN
INTRAVENOUS | Status: DC | PRN
  Administered 2020-06-20: 8 ug/min via INTRAVENOUS

## 2020-06-20 MED ORDER — SODIUM CHLORIDE 0.9 % IV SOLN: INTRAVENOUS | Status: DC

## 2020-06-20 MED ORDER — HEPARIN (PORCINE) 25000 UT/250ML-% IV SOLN
600.0000 [IU]/h | INTRAVENOUS | Status: DC
  Administered 2020-06-20: 900 [IU]/h via INTRAVENOUS
  Administered 2020-06-20: 600 [IU]/h via INTRAVENOUS
  Filled 2020-06-20: qty 250

## 2020-06-20 MED ORDER — SODIUM BICARBONATE 8.4 % IV SOLN
INTRAVENOUS | Status: DC | PRN
  Administered 2020-06-20: 50 meq via INTRAVENOUS

## 2020-06-20 MED ORDER — NOREPINEPHRINE 4 MG/250ML-% IV SOLN: 0.0000 ug/min | INTRAVENOUS | Status: DC

## 2020-06-20 MED ORDER — SODIUM CHLORIDE 0.9% IV SOLUTION: INTRAVENOUS | Status: DC

## 2020-06-20 MED ORDER — AMIODARONE HCL IN DEXTROSE 360-4.14 MG/200ML-% IV SOLN
60.0000 mg/h | INTRAVENOUS | Status: AC
  Administered 2020-06-20 – 2020-06-21 (×2): 60 mg/h via INTRAVENOUS
  Filled 2020-06-20 (×2): qty 200

## 2020-06-20 MED ORDER — IOHEXOL 350 MG/ML SOLN
INTRAVENOUS | Status: DC | PRN
  Administered 2020-06-20: 100 mL via INTRA_ARTERIAL

## 2020-06-20 MED ORDER — MILRINONE LACTATE IN DEXTROSE 20-5 MG/100ML-% IV SOLN
0.3750 ug/kg/min | INTRAVENOUS | Status: DC
  Administered 2020-06-20 – 2020-06-21 (×4): 0.5 ug/kg/min via INTRAVENOUS
  Administered 2020-06-22: 0.375 ug/kg/min via INTRAVENOUS
  Administered 2020-06-22: 0.5 ug/kg/min via INTRAVENOUS
  Administered 2020-06-22: 0.375 ug/kg/min via INTRAVENOUS
  Filled 2020-06-20 (×8): qty 100

## 2020-06-20 MED ORDER — LIDOCAINE HCL (PF) 1 % IJ SOLN
INTRAMUSCULAR | Status: AC
  Filled 2020-06-20: qty 30

## 2020-06-20 MED ORDER — CHLORHEXIDINE GLUCONATE CLOTH 2 % EX PADS
6.0000 | MEDICATED_PAD | Freq: Every day | CUTANEOUS | Status: DC
  Administered 2020-06-20 – 2020-06-29 (×9): 6 via TOPICAL

## 2020-06-20 MED ORDER — TICAGRELOR 90 MG PO TABS
ORAL_TABLET | ORAL | Status: AC
  Filled 2020-06-20: qty 2

## 2020-06-20 MED ORDER — FUROSEMIDE 10 MG/ML IJ SOLN
80.0000 mg | Freq: Two times a day (BID) | INTRAMUSCULAR | Status: DC
  Administered 2020-06-20: 80 mg via INTRAVENOUS
  Filled 2020-06-20: qty 8

## 2020-06-20 MED ORDER — METOPROLOL TARTRATE 5 MG/5ML IV SOLN
INTRAVENOUS | Status: DC | PRN
  Administered 2020-06-20: 2.5 mg via INTRAVENOUS

## 2020-06-20 MED ORDER — HEPARIN (PORCINE) IN NACL 1000-0.9 UT/500ML-% IV SOLN
INTRAVENOUS | Status: DC | PRN
  Administered 2020-06-20 (×2): 500 mL

## 2020-06-20 MED ORDER — AMIODARONE LOAD VIA INFUSION
INTRAVENOUS | Status: DC | PRN
  Administered 2020-06-20 (×2): 150 mg via INTRAVENOUS

## 2020-06-20 MED ORDER — POTASSIUM CHLORIDE 10 MEQ/50ML IV SOLN
10.0000 meq | INTRAVENOUS | Status: AC
  Administered 2020-06-20 (×3): 10 meq via INTRAVENOUS
  Filled 2020-06-20 (×3): qty 50

## 2020-06-20 MED ORDER — HEPARIN SODIUM (PORCINE) 1000 UNIT/ML IJ SOLN
INTRAMUSCULAR | Status: DC | PRN
  Administered 2020-06-20: 8500 [IU] via INTRAVENOUS
  Administered 2020-06-20: 2000 [IU] via INTRAVENOUS

## 2020-06-20 MED ORDER — SODIUM CHLORIDE 0.9 % IV SOLN: 250.0000 mL | INTRAVENOUS | Status: DC | PRN

## 2020-06-20 MED ORDER — TICAGRELOR 90 MG PO TABS
90.0000 mg | ORAL_TABLET | Freq: Two times a day (BID) | ORAL | Status: DC
  Administered 2020-06-20 – 2020-06-21 (×3): 90 mg via ORAL
  Filled 2020-06-20 (×3): qty 1

## 2020-06-20 MED ORDER — FENTANYL CITRATE (PF) 100 MCG/2ML IJ SOLN
INTRAMUSCULAR | Status: AC
  Filled 2020-06-20: qty 2

## 2020-06-20 MED ORDER — LABETALOL HCL 5 MG/ML IV SOLN: 10.0000 mg | INTRAVENOUS | Status: AC | PRN

## 2020-06-20 MED ORDER — SODIUM CHLORIDE 0.9 % IV SOLN
INTRAVENOUS | Status: AC | PRN
  Administered 2020-06-20: 10 mL/h via INTRAVENOUS

## 2020-06-20 MED ORDER — ASPIRIN 81 MG PO CHEW
81.0000 mg | CHEWABLE_TABLET | Freq: Every day | ORAL | Status: DC
  Administered 2020-06-21 – 2020-06-29 (×9): 81 mg via ORAL
  Filled 2020-06-20 (×9): qty 1

## 2020-06-20 MED ORDER — TICAGRELOR 90 MG PO TABS
ORAL_TABLET | ORAL | Status: DC | PRN
  Administered 2020-06-20: 180 mg via ORAL

## 2020-06-20 MED ORDER — LIDOCAINE HCL (PF) 1 % IJ SOLN
INTRAMUSCULAR | Status: DC | PRN
  Administered 2020-06-20: 5 mL
  Administered 2020-06-20: 15 mL
  Administered 2020-06-20: 2 mL
  Administered 2020-06-20: 5 mL

## 2020-06-20 MED ORDER — POTASSIUM CHLORIDE CRYS ER 20 MEQ PO TBCR
40.0000 meq | EXTENDED_RELEASE_TABLET | Freq: Once | ORAL | Status: AC
  Administered 2020-06-20: 40 meq via ORAL
  Filled 2020-06-20: qty 2

## 2020-06-20 MED ORDER — ACETAMINOPHEN 325 MG PO TABS
650.0000 mg | ORAL_TABLET | ORAL | Status: DC | PRN
  Administered 2020-06-25: 650 mg via ORAL
  Filled 2020-06-20: qty 2

## 2020-06-20 MED ORDER — AMIODARONE HCL 150 MG/3ML IV SOLN
INTRAVENOUS | Status: AC
  Filled 2020-06-20: qty 3

## 2020-06-20 MED ORDER — SODIUM CHLORIDE 0.9% FLUSH
3.0000 mL | Freq: Two times a day (BID) | INTRAVENOUS | Status: DC
  Administered 2020-06-21 – 2020-06-28 (×9): 3 mL via INTRAVENOUS

## 2020-06-20 MED ORDER — HYDRALAZINE HCL 20 MG/ML IJ SOLN: 10.0000 mg | INTRAMUSCULAR | Status: AC | PRN

## 2020-06-20 MED ORDER — "THROMBI-PAD 3""X3"" EX PADS"
1.0000 | MEDICATED_PAD | Freq: Once | CUTANEOUS | Status: AC
  Administered 2020-06-20: 1 via TOPICAL
  Filled 2020-06-20: qty 1

## 2020-06-20 MED ORDER — NOREPINEPHRINE 4 MG/250ML-% IV SOLN
INTRAVENOUS | Status: AC
  Filled 2020-06-20: qty 250

## 2020-06-20 MED ORDER — FENTANYL CITRATE (PF) 100 MCG/2ML IJ SOLN
INTRAMUSCULAR | Status: DC | PRN
  Administered 2020-06-20: 50 ug via INTRAVENOUS

## 2020-06-20 MED ORDER — ONDANSETRON HCL 4 MG/2ML IJ SOLN
4.0000 mg | Freq: Four times a day (QID) | INTRAMUSCULAR | Status: DC | PRN
Start: 2020-06-20 — End: 2020-06-30

## 2020-06-20 MED ORDER — MIDAZOLAM HCL 2 MG/2ML IJ SOLN
INTRAMUSCULAR | Status: AC
  Filled 2020-06-20: qty 2

## 2020-06-20 MED ORDER — AMIODARONE HCL IN DEXTROSE 360-4.14 MG/200ML-% IV SOLN
30.0000 mg/h | INTRAVENOUS | Status: DC
  Administered 2020-06-21 – 2020-06-22 (×4): 30 mg/h via INTRAVENOUS
  Administered 2020-06-23 (×2): 60 mg/h via INTRAVENOUS
  Administered 2020-06-23 – 2020-06-24 (×3): 30 mg/h via INTRAVENOUS
  Administered 2020-06-24: 60 mg/h via INTRAVENOUS
  Administered 2020-06-25 – 2020-06-28 (×6): 30 mg/h via INTRAVENOUS
  Filled 2020-06-20: qty 400
  Filled 2020-06-20 (×15): qty 200

## 2020-06-20 MED ORDER — INSULIN ASPART 100 UNIT/ML IJ SOLN
0.0000 [IU] | Freq: Three times a day (TID) | INTRAMUSCULAR | Status: DC
  Administered 2020-06-20: 2 [IU] via SUBCUTANEOUS
  Administered 2020-06-21: 3 [IU] via SUBCUTANEOUS
  Filled 2020-06-20: qty 0.09

## 2020-06-20 MED ORDER — SODIUM CHLORIDE 0.9% IV SOLUTION: INTRAVENOUS | Status: DC | PRN

## 2020-06-20 MED ORDER — MILRINONE LACTATE IN DEXTROSE 20-5 MG/100ML-% IV SOLN
INTRAVENOUS | Status: AC | PRN
  Administered 2020-06-20: 0.25 ug/kg/min via INTRAVENOUS

## 2020-06-20 MED ORDER — HEPARIN (PORCINE) IN NACL 1000-0.9 UT/500ML-% IV SOLN
INTRAVENOUS | Status: AC
  Filled 2020-06-20: qty 1000

## 2020-06-20 MED ORDER — ATORVASTATIN CALCIUM 80 MG PO TABS
80.0000 mg | ORAL_TABLET | Freq: Every day | ORAL | Status: DC
  Administered 2020-06-20 – 2020-06-28 (×9): 80 mg via ORAL
  Filled 2020-06-20 (×10): qty 1

## 2020-06-20 SURGICAL SUPPLY — 23 items
BALLN IABP SENSA PLUS 8F 50CC (BALLOONS) ×3
BALLN SAPPHIRE 2.0X12 (BALLOONS) ×3
BALLOON IABP SENS PLUS 8F 50CC (BALLOONS) IMPLANT
BALLOON SAPPHIRE 2.0X12 (BALLOONS) IMPLANT
CATH INFINITI 5FR MULTPACK ANG (CATHETERS) ×1 IMPLANT
CATH SWAN GANZ VIP 7.5F (CATHETERS) ×1 IMPLANT
CATH VISTA GUIDE 6FR XBLAD4 (CATHETERS) ×1 IMPLANT
DEVICE RAD COMP TR BAND LRG (VASCULAR PRODUCTS) ×1 IMPLANT
ELECT DEFIB PAD ADLT CADENCE (PAD) ×1 IMPLANT
GLIDESHEATH SLEND SS 6F .021 (SHEATH) ×1 IMPLANT
KIT ENCORE 26 ADVANTAGE (KITS) ×1 IMPLANT
KIT HEART LEFT (KITS) ×3 IMPLANT
PACK CARDIAC CATHETERIZATION (CUSTOM PROCEDURE TRAY) ×3 IMPLANT
SHEATH PINNACLE 6F 10CM (SHEATH) ×1 IMPLANT
SHEATH PINNACLE 8F 10CM (SHEATH) ×1 IMPLANT
SLEEVE REPOSITIONING LENGTH 30 (MISCELLANEOUS) ×1 IMPLANT
STENT RESOLUTE ONYX 2.0X22 (Permanent Stent) ×1 IMPLANT
TRANSDUCER W/STOPCOCK (MISCELLANEOUS) ×3 IMPLANT
TRAY CATH 3LUMEN 20C SULFAFREE (CATHETERS) ×1 IMPLANT
TUBING CIL FLEX 10 FLL-RA (TUBING) ×3 IMPLANT
WIRE COUGAR XT STRL 190CM (WIRE) ×1 IMPLANT
WIRE EMERALD 3MM-J .025X260CM (WIRE) ×1 IMPLANT
WIRE EMERALD 3MM-J .035X150CM (WIRE) ×1 IMPLANT

## 2020-06-20 NOTE — Progress Notes (Addendum)
   IABP 1:1 - oozing from site. Check CBC now.  On Milrinone 0.375 mcg  CO-OX improved with support from 29%--->47.5% Increase milrinone to 0.5 mcg.   Bardycardic in the low 50s after receiving amio bolus. Never started amio drip.   Amy Clegg NP-C  3:03 PM  Had 200 cc urine output so far.  Continues to ooze from LIJ, R femoral IABP,  A line site.   CBC sent. Hold heparin for 2 hours. Cath lab placing R TR band.   Amy Clegg NP-C  3:21 PM  Agree with above.   Glori Bickers, MD  5:03 PM

## 2020-06-20 NOTE — H&P (Addendum)
Advanced Heart Failure Team History and Physical Note   PCP:  Lillard Anes, MD  PCP-Cardiology: Dr Aundra Dubin   Reason for Admission: Cardiogenic Shock    HPI:   Douglas Villa is a 70 year old with a history of CAD, chronic systolic heart failure, HTN, COPD, OSA, and smoking (quit smoking in 2/19) with COPD.   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 He was admitted to a hospital there with CHF symptoms. He had had one prior episode of CHF in 2016 where he was admitted at [facility] (no records available). At the hospital in [place], he had LHC showed occluded PLV and 80% stenosis proximal LCx. EF was 25-35% by LV-gram. He had DES placed to pLCx.   Echo was done in 6/19. This showed EF 25-30% with moderate-severe Douglas, moderate-severe TR. He saw Dr. Caryl Comes for ICD consideration, given frequent PVCs a holter was placed in 9/19 =>2% PVCs. CPX was done in 9/19 showing moderate functional impairment from heart failure.Repeat echo in 1/20 showed EF 20-25% with severe LVH, mild Douglas, normal RV.   He had been followed by Dr Aundra Dubin and had a virtual visit back to 2020. He has not been seen since that time because he was concerned about COVID. He has been of meds for some time.   Saw PCP 2 days ago for increased shortness of breath and was sent to the ED. Prior to that he had not been seen for 2 years. Sent to ED for further evaluation. Volume overloaded.ECHO completed and showed EF 30%.  Cardiology consulted. Started on IV lasix.  EKG--with ST elevation in anterior leads. Transferred to [facility] for cath.  Shock Team consulted.   ECHO in cath lab with severe biventricular heart failure. EF < 20%.   Cath results Occluded distal LAD-->PCI.   IABP placed  Cath  RA 29 PWP 28  CO 2.2 CI 1.15 CPO .27 PAPi 1.0   Ost LAD to Prox LAD lesion is 20% stenosed.  Mid LAD lesion is 20% stenosed.  Dist LAD lesion is 100% stenosed.  Ost RCA to Prox RCA lesion is 20%  stenosed.  Previously placed 1st Mrg stent (unknown type) is widely patent.     Review of Systems: [y] = yes, _0  = no  Seen in the Cath lab.   . General: Weight gain _1 ; Weight loss _2 ; Anorexia _3 ; Fatigue _4 ; Fever _5 ; Chills _6 ; Weakness _7   . Cardiac: Chest pain/pressure _8 ; Resting SOB _9 ; Exertional SOB [ Y]; Orthopnea [ Y]; Pedal Edema [Y ]; Palpitations _10 ; Syncope _11 ; Presyncope _12 ; Paroxysmal nocturnal dyspnea_13   . Pulmonary: Cough _14 ; Wheezing_15 ; Hemoptysis_16 ; Sputum _17 ; Snoring _18   . GI: Vomiting_19 ; Dysphagia_20 ; Melena_21 ; Hematochezia _22 ; Heartburn_23 ; Abdominal pain _24 ; Constipation _25 ; Diarrhea _26 ; BRBPR _27   . GU: Hematuria_28 ; Dysuria _29 ; Nocturia_30   . Vascular: Pain in legs with walking _31 ; Pain in feet with lying flat _32 ; Non-healing sores _33 ; Stroke _34 ; TIA _35 ; Slurred speech _36 ;  . Neuro: Headaches_37 ; Vertigo_38 ; Seizures_39 ; Paresthesias_40 ;Blurred vision _41 ; Diplopia _42 ; Vision changes _43   . Ortho/Skin: Arthritis _44 ; Joint pain _45 ; Muscle pain _46 ; Joint swelling _47 ; Back Pain _48 ; Rash _49   . Psych: Depression_50 ; Anxiety_51   .  Heme: Bleeding problems _0 ; Clotting disorders _1 ; Anemia _2   . Endocrine: Diabetes [ Y]; Thyroid dysfunction_3    Home Medications Prior to Admission medications   Medication Sig Start Date End Date Taking? Authorizing Provider  ACCU-CHEK FASTCLIX LANCETS MISC by Does not apply route. Patient not taking: Reported on 06/18/2020    [provider]  albuterol (PROVENTIL HFA;VENTOLIN HFA) 108 (90 Base) MCG/ACT inhaler Inhale 2 puffs into the lungs every 6 (six) hours as needed for wheezing or shortness of breath. 08/05/17   Lauraine Rinne, Douglas Villa  aspirin 81 MG tablet Take 1 tablet (81 mg total) by mouth daily. 09/02/17   Larey Dresser, MD  atorvastatin (LIPITOR) 80 MG tablet Take 1 tablet (80 mg total) by mouth daily. Needs appt for future refills 01/09/20   Larey Dresser, MD  Blood Glucose  Monitoring Suppl (ACCU-CHEK AVIVA PLUS) w/Device KIT by Does not apply route. Patient not taking: Reported on 06/18/2020    [provider]  carvedilol (COREG) 6.25 MG tablet Take 2 tablets by mouth twice daily 09/24/19   Larey Dresser, MD  clopidogrel (PLAVIX) 75 MG tablet TAKE 1 TABLET BY MOUTH ONCE DAILY . APPOINTMENT REQUIRED FOR FUTURE REFILLS 01/09/20   Larey Dresser, MD  glucose blood test strip 1 each by Other route as needed. Use as instructed Patient not taking: Reported on 06/18/2020    [provider]  hydrALAZINE (APRESOLINE) 25 MG tablet TAKE 1 & 1/2 (ONE & ONE-HALF) TABLETS BY MOUTH THREE TIMES DAILY. NEED AN APPOINTMENT FOR FUTURE REFILLS 04/04/20   Larey Dresser, MD  isosorbide mononitrate (IMDUR) 30 MG 24 hr tablet Take 1 tablet (30 mg total) by mouth daily. Needs appt for future refills 01/09/20   Larey Dresser, MD  nitroGLYCERIN (NITROSTAT) 0.4 MG SL tablet Place 0.4 mg under the tongue every 5 (five) minutes as needed for chest pain.    [provider]  potassium chloride SA (KLOR-CON) 20 MEQ tablet Take 3 tablets (60 mEq total) by mouth daily. Needs appt for future refills 01/09/20   Larey Dresser, MD  spironolactone (ALDACTONE) 25 MG tablet Take 1 tablet by mouth once daily 09/24/19   Larey Dresser, MD  torsemide (DEMADEX) 20 MG tablet Take 2 tabs every other day ALTERNATING with 3 tabs every other day, MUST HAVE APPT FOR FURTHER REFILLS 04/04/20   Larey Dresser, MD  umeclidinium-vilanterol [facility] ELLIPTA) 62.5-25 MCG/INH AEPB Inhale 1 puff into the lungs daily. Patient not taking: Reported on 06/18/2020 09/16/17   Lauraine Rinne, Douglas Villa    Past Medical History: Past Medical History:  Diagnosis Date  . Abnormal EKG 03/2009  . CAD (coronary artery disease)   . CKD (chronic kidney disease)   . Heart failure with preserved left ventricular function (HFpEF) ([place])   . Hypertension   . OSA (obstructive sleep apnea)   . Sexual dysfunction   .  Type 2 diabetes mellitus with diabetic nephropathy ([place]) 06/18/2020    Past Surgical History: Past Surgical History:  Procedure Laterality Date  . APPENDECTOMY  1969  . CARDIAC CATHETERIZATION    . CORONARY ANGIOPLASTY      Family History:  Family History  Problem Relation Age of Onset  . Cancer Mother 45  . Stroke Father 43    Social History: Social History   Socioeconomic History  . Marital status: Single    Spouse name: Not on file  . Number of children: Not on  file  . Years of education: Not on file  . Highest education level: Not on file  Occupational History  . Not on file  Tobacco Use  . Smoking status: Former Smoker    Packs/day: 1.00    Years: 45.00    Pack years: 45.00    Types: Cigarettes    Quit date: 2020    Years since quitting: 2.3  . Smokeless tobacco: Never Used  Vaping Use  . Vaping Use: Never used  Substance and Sexual Activity  . Alcohol use: No  . Drug use: No  . Sexual activity: Not Currently  Other Topics Concern  . Not on file  Social History Narrative  . Not on file   Social Determinants of Health   Financial Resource Strain: Not on file  Food Insecurity: Not on file  Transportation Needs: Not on file  Physical Activity: Not on file  Stress: Not on file  Social Connections: Not on file    Allergies:  Allergies  Allergen Reactions  . Penicillins     Sweating     Objective:    Vital Signs:   BP: (112)/(82) 112/82 (05/20 1216) SpO2:  [100 %] 100 % (05/20 1007)   There were no vitals filed for this visit.   Physical Exam     General:  No respiratory difficulty HEENT: Normal Neck: LIJ  Supple. JVP to jaw  Carotids 2+ bilat; no bruits. No lymphadenopathy or thyromegaly appreciated. Cor: PMI nondisplaced. Regular rate & rhythm. No rubs, gallops or murmurs. Lungs: Clear Abdomen: Soft, nontender, nondistended. No hepatosplenomegaly. No bruits or masses. Good bowel sounds. Extremities: No cyanosis, clubbing, rash,  edema. R groin IABP.  Neuro: Alert & oriented x 3, cranial nerves grossly intact. moves all 4 extremities w/o difficulty. Affect flat   Telemetry   A flutter   EKG     Labs     Basic Metabolic Panel: No results for input(s): NA, K, CL, CO2, GLUCOSE, BUN, CREATININE, CALCIUM, MG, PHOS in the last 168 hours.  Liver Function Tests: No results for input(s): AST, ALT, ALKPHOS, BILITOT, PROT, ALBUMIN in the last 168 hours. No results for input(s): LIPASE, AMYLASE in the last 168 hours. No results for input(s): AMMONIA in the last 168 hours.  CBC: No results for input(s): WBC, NEUTROABS, HGB, HCT, MCV, PLT in the last 168 hours.  Cardiac Enzymes: No results for input(s): CKTOTAL, CKMB, CKMBINDEX, TROPONINI in the last 168 hours.  BNP: BNP (last 3 results) No results for input(s): BNP in the last 8760 hours.  ProBNP (last 3 results) No results for input(s): PROBNP in the last 8760 hours.   CBG: No results for input(s): GLUCAP in the last 168 hours.  Coagulation Studies: No results for input(s): LABPROT, INR in the last 72 hours.  Imaging:  No results found.    Assessment/Plan   1. STEMI--> Cardiogenic Shock  Emergent cath-Had occluded distal LAD and underwent emergent PCI.  Started on brillinta.  -Shock physiology noted during cath. IABP placed 1:1 . Started on heparin drip.  -Placed on milrinone 0.375 + norepi 6 mcg.  - Follow CO-OX/CVP  -Start IV lasix 80 mg twice a day  2. CKD Stage IV -Creatinine on 5/19 2.4  - Check BMEt   3. DMII Hgb A1C 7.5   5 /19/22 Start SSI   4. A flutter  Start on amio drip  Started on heparin drip.     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 Found to have acute occlusion of distal LAD. Underwent PCI of LAD with Dr. Claiborne Billings.   Post PCI developed shock with MV sat 29%. I was called to cath lab to assist. Luiz Blare numbers suggestive of severe biventricular failure/shock with CPO 0.44 and PAPI 1.0 with CI 1.1  State echo with LVEF 15% RV severely decreased with severe TR and moderate to severe Douglas. No VSD.   Presentation felt to be more of progressive HF/shock c/b acute shock from STEMI   Milrinone and NE titrated and IABP placed.   General:  Ill appearing. No resp difficulty HEENT: normal Neck: supple.JVP to jaw . Carotids 2+ bilat; no bruits. No lymphadenopathy or thryomegaly appreciated. Cor: PMI nondisplaced. Irregular rate & rhythm. 2/6 TR Lungs: clear Abdomen: soft, nontender, nondistended. No hepatosplenomegaly. No bruits or masses. Good bowel sounds. Extremities: no cyanosis, clubbing, rash, edema Neuro: alert & orientedx3, cranial nerves grossly intact. moves all 4 extremities w/o difficulty. Affect pleasant  He has severe shock. Likely more from progressive biventricular HF than acute post-STEMI shock.   Will continue to support with IABP and milrinone/NE. Not candidate for advanced therapies.   Total CCT 120 mins .  Glori Bickers, MD  5:02 PM

## 2020-06-20 NOTE — Progress Notes (Signed)
RN changed Left CL dressing d/t continuous oozing of blood. Abd pad that was placed under patients was saturated with blood. Two thrombipads placed as well as two guaze's in hopes to stop bleeding.  Pt right groin is still oozing blood, abd placed in between legs around 2000, and was replaced. Will reassess bleeding and compare to determine status of coagulation.

## 2020-06-20 NOTE — Progress Notes (Signed)
Date and time results received: 06/20/20 2:17 PM  (use smartphrase ".now" to insert current time)  Test: Lactic  Critical Value: 2.0  Name of Provider Notified: Clegg NP  Orders Received? Or Actions Taken?: Actions Taken: none   Kathleene Hazel RN

## 2020-06-20 NOTE — Progress Notes (Addendum)
Pt had a 12 beat run of perfused VTach. RN was present in the room during this and pt was asymptomatic. Potassium infusing at this time, no change in [place] movement form my knowledge. Pt in NSR.

## 2020-06-20 NOTE — Progress Notes (Signed)
Orthopedic Tech Progress Note Patient Details:  Douglas Villa [birth year] [id number] Could not put knee immobilizer on patient because there were bandages and also wires connected to the leg that needed the immobilizer. Nurse looked and decided knee immobilizer was no longer needed  Patient ID: Douglas Villa, male   DOB: 1950/03/16, 70 y.o.   MRN: [id number]   [name] 06/20/2020, 4:19 PM

## 2020-06-20 NOTE — Progress Notes (Signed)
Pt had a 13 beat run of perfused VTach. RN was present in the room during this and pt was asymptomatic. Potassium level is 3.5, no change in [place] movement form my knowledge. Pt in NSR.

## 2020-06-20 NOTE — CV Procedure (Signed)
Radial arterial line placement.    Emergent consent assumed. The right wrist was prepped and draped in the routine sterile fashion a single lumen radial arterial catheter was placed in the right radial artery using a modified Seldinger technique. Good blood flow and wave forms. A dressing was placed.     Glori Bickers, MD  5:03 PM

## 2020-06-20 NOTE — Progress Notes (Signed)
Pt had a 15 beat run of perfused VTach. RN was present in the room during this and pt was asymptomatic. RN believes this was in responce to the low K+ (3.0). Orders are in for K+ replacement and RN will continue to monitor ECG and K+ levels.

## 2020-06-20 NOTE — Progress Notes (Signed)
Date and time results received: 06/20/20  Critical Value: Troponin  Name of Provider Notified: Clegg NP   Actions Taken: None expected    Kathleene Hazel RN

## 2020-06-20 NOTE — Progress Notes (Signed)
Per Dr. Aundra Dubin, hold Heparin d/t bleeding.

## 2020-06-20 NOTE — Progress Notes (Addendum)
Pt had a 5 beat run of perfused VTach and then NSR, then went back into a 5 beat run of perfused VTach.  RN was present in the room during this and pt was asymptomatic. Potassium infusing at this time, no change in [place] movement form my knowledge.Pt in NSR.

## 2020-06-20 NOTE — CV Procedure (Signed)
Central Venous Catheter Insertion Procedure Note Douglas Villa [id number] 1950-07-04    Procedure: Insertion of Central Venous Catheter Indications: Drug and/or fluid administration   Procedure Details Consent: Risks of procedure as well as the alternatives and risks of each were explained to the (patient/caregiver).  Consent for procedure obtained. Time Out: Verified patient identification, verified procedure, site/side was marked, verified correct patient position, special equipment/implants available, medications/allergies/relevent history reviewed, required imaging and test results available.  Performed   Maximum sterile technique was used including antiseptics, cap, gloves, gown, hand hygiene, mask and sheet. Skin prep: Chlorhexidine; local anesthetic administered A antimicrobial bonded/coated triple lumen catheter was placed in the left internal jugular vein using the Seldinger technique and u/s guidance.   Evaluation Blood flow good Complications: No apparent complications Patient did tolerate procedure well. Placement verified on fluoro in cath lab.    Glori Bickers, MD  5:04 PM

## 2020-06-20 NOTE — Progress Notes (Signed)
ANTICOAGULATION CONSULT NOTE - Initial Consult  Pharmacy Consult for heparin drip  Indication: ACS/STEMI/IABP  Patient Measurements: Heparin Dosing Weight: 80.4kg   Vital Signs: BP: 112/82 (05/20 1216)   Assessment: 70 yo male admitted for STEMI s/p cath with PCI and insertion of IABP. Patient is not on anticoagulation prior to admission.    Labs from OSH pre-op: Hgb 12.2, Plt 138  Goal of Therapy:  Heparin level 0.3-0.5 units/ml Monitor platelets by anticoagulation protocol: Yes   Plan:  Begin heparin infusion 900 units/hr  F/u heparin level in 8hrs  F/u daily CBC, HL, and s/sx bleeding  Wilson Singer, PharmD PGY1 Pharmacy Resident 06/20/2020 12:44 PM

## 2020-06-20 NOTE — Progress Notes (Signed)
Orders placed for Amio drip w/o bolus for intermittent VTach, per Dr. Aundra Dubin.   Per Dr. Aundra Dubin, continue to monitor site for bleeding, if still oozing, keep heparin off.  RN will communicate with Pharmacy if sites stop bleeding.

## 2020-06-20 NOTE — Progress Notes (Signed)
RN called Dr. Aundra Dubin in regards to patients CI of 1.69. Per Dr. Aundra Dubin, continue to trend CI and if results trend downward, repeat CO-OX level and notify if CO-OX level is dropping.

## 2020-06-20 NOTE — Progress Notes (Signed)
Right radial a line got pulled out and site still bleeding after 45 minutes of pressure being held.  Decided to place a TR band on the site.  15cc of air placed in the band and heparin drip d/c'd for 2 hours.  ACT still 225.  Site looks good no hematoma and oozing controled.

## 2020-06-20 NOTE — Progress Notes (Addendum)
ANTICOAGULATION CONSULT NOTE - Initial Consult  Pharmacy Consult for heparin drip  Indication: ACS/STEMI/IABP  Patient Measurements: Heparin Dosing Weight: 80.4kg   Vital Signs: Temp: 95.54 F (35.3 C) (05/20 1800) BP: 142/85 (05/20 1800) Pulse Rate: 72 (05/20 1800)   Assessment: 70 yo male admitted for STEMI s/p cath with PCI and insertion of IABP. Patient is not on anticoagulation prior to admission.    Labs from OSH pre-op: Hgb 12.2, Plt 138.  Was started on 900 units/hr but was oozing significantly - decision made to hold anticoagulation and monitor. Still oozing with ACT 172 - discussed with MD and will start heparin at a reduced dose.   Goal of Therapy:  Heparin level 0.3-0.5 units/ml Monitor platelets by anticoagulation protocol: Yes   Plan:  Begin heparin infusion at 600 units/hr  F/u heparin level in 8hrs  F/u daily CBC, HL, and s/sx bleeding  Antonietta Jewel, PharmD, [place] Pharmacist  Phone: [phone] 06/20/2020 7:16 PM  Please check AMION for all [place] phone numbers After 10:00 PM, call [place] [phone]  ADDENDUM Patient still with significant oozing in neck, with TR band deflation, and at groin - discussed with MD and will hold heparin a couple hours and reassess.   Antonietta Jewel, PharmD, [facility] Clinical Pharmacist

## 2020-06-20 NOTE — Progress Notes (Signed)
  Echocardiogram 2D Echocardiogram has been performed.  Douglas Villa F 06/20/2020, 12:21 PM

## 2020-06-21 ENCOUNTER — Inpatient Hospital Stay (HOSPITAL_COMMUNITY): Payer: Medicare Other

## 2020-06-21 ENCOUNTER — Inpatient Hospital Stay: Payer: Self-pay

## 2020-06-21 DIAGNOSIS — I5021 Acute systolic (congestive) heart failure: Secondary | ICD-10-CM

## 2020-06-21 LAB — BASIC METABOLIC PANEL
Anion gap: 10 (ref 5–15)
BUN: 39 mg/dL — ABNORMAL HIGH (ref 8–23)
CO2: 28 mmol/L (ref 22–32)
Calcium: 8.6 mg/dL — ABNORMAL LOW (ref 8.9–10.3)
Chloride: 98 mmol/L (ref 98–111)
Creatinine, Ser: 2.06 mg/dL — ABNORMAL HIGH (ref 0.61–1.24)
GFR, Estimated: 34 mL/min — ABNORMAL LOW (ref >60)
Glucose, Bld: 169 mg/dL — ABNORMAL HIGH (ref 70–99)
Potassium: 3.6 mmol/L (ref 3.5–5.1)
Sodium: 136 mmol/L (ref 135–145)

## 2020-06-21 LAB — POCT ACTIVATED CLOTTING TIME
Activated Clotting Time: 225 seconds
Activated Clotting Time: 142 seconds

## 2020-06-21 LAB — CBC
HCT: 35.7 % — ABNORMAL LOW (ref 39.0–52.0)
Hemoglobin: 11.6 g/dL — ABNORMAL LOW (ref 13.0–17.0)
MCH: 27.1 pg (ref 26.0–34.0)
MCHC: 32.5 g/dL (ref 30.0–36.0)
MCV: 83.4 fL (ref 80.0–100.0)
Platelets: 144 10*3/uL — ABNORMAL LOW (ref 150–400)
RBC: 4.28 MIL/uL (ref 4.22–5.81)
RDW: 15.1 % (ref 11.5–15.5)
WBC: 8.4 10*3/uL (ref 4.0–10.5)
nRBC: 0 % (ref 0.0–0.2)

## 2020-06-21 LAB — COOXEMETRY PANEL
Carboxyhemoglobin: 1.5 % (ref 0.5–1.5)
Carboxyhemoglobin: 1.3 % (ref 0.5–1.5)
Methemoglobin: 1 % (ref 0.0–1.5)
Methemoglobin: 0.9 % (ref 0.0–1.5)
O2 Saturation: 67.2 %
O2 Saturation: 67.1 %
Total hemoglobin: 11.8 g/dL — ABNORMAL LOW (ref 12.0–16.0)
Total hemoglobin: 12 g/dL (ref 12.0–16.0)

## 2020-06-21 LAB — GLUCOSE, CAPILLARY
Glucose-Capillary: 220 mg/dL — ABNORMAL HIGH (ref 70–99)
Glucose-Capillary: 251 mg/dL — ABNORMAL HIGH (ref 70–99)
Glucose-Capillary: 177 mg/dL — ABNORMAL HIGH (ref 70–99)

## 2020-06-21 LAB — HEMOGLOBIN A1C
Hgb A1c MFr Bld: 7.7 % — ABNORMAL HIGH (ref 4.8–5.6)
Mean Plasma Glucose: 174.29 mg/dL

## 2020-06-21 LAB — ECHOCARDIOGRAM COMPLETE
Height: 65 in
S' Lateral: 5.7 cm
Weight: 2984.15 oz

## 2020-06-21 LAB — MAGNESIUM: Magnesium: 2.2 mg/dL (ref 1.7–2.4)

## 2020-06-21 MED ORDER — SODIUM CHLORIDE 0.9% FLUSH: 10.0000 mL | INTRAVENOUS | Status: DC | PRN

## 2020-06-21 MED ORDER — "THROMBI-PAD 3""X3"" EX PADS"
1.0000 | MEDICATED_PAD | Freq: Once | CUTANEOUS | Status: AC
  Administered 2020-06-21: 1 via TOPICAL
  Filled 2020-06-21: qty 1

## 2020-06-21 MED ORDER — SODIUM CHLORIDE 0.9% FLUSH
10.0000 mL | Freq: Two times a day (BID) | INTRAVENOUS | Status: DC
  Administered 2020-06-21 – 2020-06-28 (×11): 10 mL

## 2020-06-21 MED ORDER — SODIUM CHLORIDE 0.9% FLUSH
10.0000 mL | Freq: Two times a day (BID) | INTRAVENOUS | Status: DC
  Administered 2020-06-21: 10 mL

## 2020-06-21 MED ORDER — INSULIN ASPART 100 UNIT/ML IJ SOLN
0.0000 [IU] | Freq: Three times a day (TID) | INTRAMUSCULAR | Status: DC
  Administered 2020-06-21: 8 [IU] via SUBCUTANEOUS
  Administered 2020-06-21: 2 [IU] via SUBCUTANEOUS
  Administered 2020-06-22 (×2): 3 [IU] via SUBCUTANEOUS
  Administered 2020-06-23: 2 [IU] via SUBCUTANEOUS
  Administered 2020-06-23: 3 [IU] via SUBCUTANEOUS
  Administered 2020-06-23 – 2020-06-24 (×2): 5 [IU] via SUBCUTANEOUS
  Administered 2020-06-24: 2 [IU] via SUBCUTANEOUS
  Administered 2020-06-24 – 2020-06-25 (×3): 3 [IU] via SUBCUTANEOUS

## 2020-06-21 MED ORDER — POTASSIUM CHLORIDE CRYS ER 20 MEQ PO TBCR
40.0000 meq | EXTENDED_RELEASE_TABLET | Freq: Four times a day (QID) | ORAL | Status: AC
  Administered 2020-06-21 (×2): 40 meq via ORAL
  Filled 2020-06-21 (×2): qty 2

## 2020-06-21 MED ORDER — FUROSEMIDE 10 MG/ML IJ SOLN
80.0000 mg | Freq: Once | INTRAMUSCULAR | Status: AC
  Administered 2020-06-21: 80 mg via INTRAVENOUS
  Filled 2020-06-21: qty 8

## 2020-06-21 MED ORDER — HEPARIN (PORCINE) 25000 UT/250ML-% IV SOLN: 600.0000 [IU]/h | INTRAVENOUS | Status: DC

## 2020-06-21 MED ORDER — INSULIN ASPART 100 UNIT/ML IJ SOLN
0.0000 [IU] | Freq: Every day | INTRAMUSCULAR | Status: DC
  Administered 2020-06-21: 3 [IU] via SUBCUTANEOUS

## 2020-06-21 NOTE — Progress Notes (Signed)
Patient ID: Douglas Villa, male   DOB: February 03, 1950, 70 y.o.   MRN: [id number]     Advanced Heart Failure Rounding Note  PCP-Cardiologist: None   Subjective:    Oozing from IABP and left IJ CVL sites all night.  Heparin started and stopped multiple times, currently off.  Hgb ok at 11.6 this morning.   Co-ox 67% on milrinone 0.5.  Excellent diuresis (net negative 2484 cc) with Lasix 80 mg IV bid.  CVP 8-9 this morning. Breathing improved.  No chest pain.    Limited echo: EF < 20%, mod-severe RV dysfunction with mild RVE and D-shaped septum, severe TR with poor coaptation, severe biatrial enlargement, no VSD.  Mitral regurgitation not interrogated.   Swan number: CVP 8-9 PA 44/21 CI 2.7 PAPI 2.5   Objective:   Weight Range: 84.6 kg Body mass index is 31.04 kg/m.   Vital Signs:   Temp:  [95.18 F (35.1 C)-97.5 F (36.4 C)] 97.3 F (36.3 C) (05/21 0700) Pulse Rate:  [52-87] 65 (05/21 0700) Resp:  [13-36] 20 (05/21 0700) BP: (106-149)/(67-114) 116/87 (05/21 0700) SpO2:  [89 %-100 %] 100 % (05/21 0700) Arterial Line BP: (137)/(60) 137/60 (05/20 1400) Weight:  [84.6 kg-88.5 kg] 84.6 kg (05/21 0500)    Weight change: Filed Weights   06/20/20 1236 06/21/20 0500  Weight: 88.5 kg 84.6 kg    Intake/Output:   Intake/Output Summary (Last 24 hours) at 06/21/2020 0807 Last data filed at 06/21/2020 0700 Gross per 24 hour  Intake 1816.3 ml  Output 4300 ml  Net -2483.7 ml      Physical Exam    General:  Well appearing. No resp difficulty HEENT: Normal. Oozing form LIJ CVL Neck: Supple. JVP 8 cm. Carotids 2+ bilat; no bruits. No lymphadenopathy or thyromegaly appreciated. Cor: PMI nondisplaced. Irregular rate & rhythm. No rubs, gallops or murmurs.   Lungs: Clear Abdomen: Soft, nontender, nondistended. No hepatosplenomegaly. No bruits or masses. Good bowel sounds. Extremities: No cyanosis, clubbing, rash. 1+ ankle edema.  Neuro: Alert & orientedx3, cranial nerves grossly  intact. moves all 4 extremities w/o difficulty. Affect pleasant   Telemetry   Atrial flutter 90s (personally reviewed)  EKG    Atrial flutter with residual AL STE (personally reviewed)  Labs    CBC Recent Labs    06/20/20 2249 06/21/20 0352  WBC 9.0 8.4  HGB 11.9* 11.6*  HCT 36.8* 35.7*  MCV 84.0 83.4  PLT 149* 258*   Basic Metabolic Panel Recent Labs    06/20/20 1815 06/20/20 2249 06/21/20 0352  NA 132* 134* 136  K 3.0* 3.5 3.6  CL 96* 97* 98  CO2 26 28 28   GLUCOSE 203* 138* 169*  BUN 44* 43* 39*  CREATININE 2.27* 2.16* 2.06*  CALCIUM 8.4* 8.3* 8.6*  MG 2.4  --  2.2   Liver Function Tests No results for input(s): AST, ALT, ALKPHOS, BILITOT, PROT, ALBUMIN in the last 72 hours. No results for input(s): LIPASE, AMYLASE in the last 72 hours. Cardiac Enzymes No results for input(s): CKTOTAL, CKMB, CKMBINDEX, TROPONINI in the last 72 hours.  BNP: BNP (last 3 results) No results for input(s): BNP in the last 8760 hours.  ProBNP (last 3 results) No results for input(s): PROBNP in the last 8760 hours.   D-Dimer No results for input(s): DDIMER in the last 72 hours. Hemoglobin A1C Recent Labs    06/20/20 1310  HGBA1C 7.4*   Fasting Lipid Panel No results for input(s): CHOL, HDL, LDLCALC, TRIG, CHOLHDL, LDLDIRECT  in the last 72 hours. Thyroid Function Tests No results for input(s): TSH, T4TOTAL, T3FREE, THYROIDAB in the last 72 hours.  Invalid input(s): FREET3  Other results:   Imaging    CARDIAC CATHETERIZATION  Result Date: 06/20/2020  Ost LAD to Prox LAD lesion is 20% stenosed.  Mid LAD lesion is 20% stenosed.  Dist LAD lesion is 100% stenosed.  Ost RCA to Prox RCA lesion is 20% stenosed.  Previously placed 1st Mrg stent (unknown type) is widely patent.  3rd RPL lesion is 100% stenosed.  Acute anterolateral myocardial infarction secondary to mid-distal total occlusion of the LAD. The LAD had mild nonobstructive disease in the proximal to mid  segment. Widely patent stent in the circumflex vessel. Mild luminal irregularity of the RCA with total occlusion of the PLA branch posteriorly with left to right collateralization. Successful coronary intervention with PTCA/DES stenting of the mid distal LAD with insertion of a 2.0 x 22 mm Resolute Onyx stent restoration of TIMI-3 flow.  There was initial spasm proximal  and distal stent following stent implantation. Cardiogenic shock with severe biventricular failure and EF estimate approximately 10%.  There is severe tricuspid regurgitation. Cardiac power output [facility]): 0.44 Pulmonary artery pulsatility index (PAPI): 1.0 Insertion of intra-aortic balloon pump. RECOMMENDATION: DAPT for minimum of 1 year.  The patient will continue with intra-aortic balloon pump support, milrinone and levophed presently.  He is now on intravenous amiodarone drip following bolus x2 for atrial flutter with variable block.  Creatinine this morning at [facility] was 2.6.  Close follow-up of renal function will be necessary.   Port CXR  Result Date: 06/20/2020 CLINICAL DATA:  Check central line placement EXAM: PORTABLE CHEST 1 VIEW COMPARISON:  06/20/2020 FINDINGS: Cardiac shadow remains enlarged. Left jugular central line is noted with catheter tip in the proximal superior vena cava. No pneumothorax is noted. The lungs are clear. Intra-aortic balloon pump and Swan-Ganz catheter from an inferior approach are noted. IMPRESSION: No evidence of pneumothorax following left jugular central line placement. Tubes and lines as described above. Stable cardiomegaly. Electronically Signed   By: Inez Catalina M.D.   On: 06/20/2020 16:57   ECHOCARDIOGRAM LIMITED  Result Date: 06/20/2020    ECHOCARDIOGRAM LIMITED REPORT   Patient Name:   Douglas Villa Date of Exam: 06/20/2020 Medical Rec #:  [id number]         Height:       65.0 in Accession #:    [id number]        Weight:       195.0 lb Date of Birth:  1950-11-15          BSA:           1.957 m Patient Age:    37 years          BP:           112/82 mmHg Patient Gender: M                 HR:           55 bpm. Exam Location:  Inpatient Procedure: Limited Color Doppler, Color Doppler and Cardiac Doppler Indications:    I23.2 VSD following acute MI  History:        Patient has prior history of Echocardiogram examinations, most                 recent 02/09/2018. CHF; Acute MI.  Sonographer:    Merrie Roof RDCS Referring Phys: [place]  KELLY  Sonographer Comments: This was a limited echo performed in the cath lab for LV/RV function and rule out VSD and assess TR to determine next steps. IMPRESSIONS  1. Left ventricular ejection fraction, by estimation, is <20%. The left ventricle has severely decreased function. The left ventricle demonstrates global hypokinesis. There is the interventricular septum is flattened in diastole ('D' shaped left ventricle), consistent with right ventricular volume overload. Ventricular septum appears intact with no evidence of shunting by color doppler interrogation.  2. Right ventricular systolic function is severely reduced. The right ventricular size is mildly enlarged. A Swanz-Ganz catheter is visualized.  3. The tricuspid valve structure appears normal. There is severe, unobstructive tricuspid regurgtiation that appears functional due to lack of central coaptation of TV leaflets.  4. Left atrial size was severely dilated.  5. Right atrial size was severely dilated.  6. The mitral valve is degenerative. Moderate mitral annular calcification. Comparison(s): Compared to prior TTE in 2020, the LVEF is now <20% (previously 20-25%) with RV failure (previously normal) and severe TR (previoulsly mild). Conclusion(s)/Recommendation(s): There is severe biventricular failure with severe tricuspid regurgitation. No VSD visualized. FINDINGS  Left Ventricle: Ventricular septum appears intact with no evidence of shunting by color doppler interrogation. Left ventricular ejection  fraction, by estimation, is <20%. The left ventricle has severely decreased function. The left ventricle demonstrates global hypokinesis. The interventricular septum is flattened in diastole ('D' shaped left ventricle), consistent with right ventricular volume overload. Right Ventricle: The right ventricular size is mildly enlarged. Right ventricular systolic function is severely reduced. Left Atrium: Left atrial size was severely dilated. Right Atrium: Right atrial size was severely dilated. Mitral Valve: The mitral valve is degenerative in appearance. There is mild thickening of the mitral valve leaflet(s). There is mild calcification of the mitral valve leaflet(s). Moderate mitral annular calcification. Tricuspid Valve: The tricuspid valve is normal in structure. Tricuspid valve regurgitation is severe. IAS/Shunts: No atrial level shunt detected by color flow Doppler. Additional Comments: A Swanz-Ganz catheter is visualized. Gwyndolyn Kaufman MD Electronically signed by Gwyndolyn Kaufman MD Signature Date/Time: 06/20/2020/2:41:51 PM    Final    [place] EKG SITE RITE  Result Date: 06/21/2020 If Site Rite image not attached, placement could not be confirmed due to current cardiac rhythm.     Medications:     Scheduled Medications: . aspirin  81 mg Oral Daily  . atorvastatin  80 mg Oral q1800  . Chlorhexidine Gluconate Cloth  6 each Topical Daily  . furosemide  80 mg Intravenous Once  . insulin aspart  0-9 Units Subcutaneous TID WC  . sodium chloride flush  10-40 mL Intracatheter Q12H  . sodium chloride flush  3 mL Intravenous Q12H  . ticagrelor  90 mg Oral BID     Infusions: . sodium chloride 10 mL/hr at 06/20/20 1637  . sodium chloride    . sodium chloride 10 mL/hr at 06/21/20 0700  . sodium chloride 10 mL/hr at 06/21/20 0700  . amiodarone 30 mg/hr (06/21/20 0700)  . milrinone 0.5 mcg/kg/min (06/21/20 0700)  . norepinephrine (LEVOPHED) Adult infusion Stopped (06/20/20 1346)     PRN  Medications:  sodium chloride, sodium chloride, acetaminophen, ondansetron (ZOFRAN) IV, sodium chloride flush, sodium chloride flush    Assessment/Plan   1. CAD: H/o DES to LCX in 3/19, had occluded PLV at that time. Admitted with anterior STEMI and occluded mLAD, had DES to mLAD, known TO PLV with collaterals and patent LCx stent.  No chest pain.  - Continue  ASA 81, ticagrelor => eventual transition to Plavix as he will need anticoagulation with AFL.  - Continue atorvastatin 80.  2. CKD: Stage 3, creatinine seems to be around 2 at baseline.  Creatinine 2.06 this morning.  3. 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  Echo this admission incomplete, showed EF < 20%, mod-severe RV dysfunction with mild RVE and D-shaped septum, severe TR with poor coaptation, severe biatrial enlargement, no VSD.  Mitral regurgitation not interrogated.  Severe biventricular failure.  This morning, on milrinone 0.5 with IABP 1:1.  Co-ox 67% with CI 2.7.  Good diuresis yesterday, CVP 8-9 today. Oozing around CVL and IABP, have had to stop heparin gtt.  - Continue milrinone, wean IABP to 1:2.  Repeat co-ox on 1:2, if remains stable wean to 1:3 and d/c IABP.  - D/c LIJ CVL and place PICC (CVL is constantly oozing).  - Lasix 80 mg IV x 1 this morning then stop.  4. COPD: Prior smoker.  5. Type 2 diabetes: SSI for now, send hgbA1c.  6. Atrial flutter: Newly noted.  Remains in AFL with controlled rate this morning.   - Continue amiodarone gtt 30 mg/hr.  - Off heparin gtt with oozing lines, will eventually need to restart anticoagulation, DCCV eventually if remains in AFL.  7. Mitral regurgitation: Not interrogated.  Needs full echo this morning to assess.  8. Tricuspid regurgitation: Severe, poor coaptation of leaflets with RV failure.  No VSD visualized on initial limited echo.   CRITICAL CARE Performed by:  Loralie Champagne  Total critical care time: 40 minutes  Critical care time was exclusive of separately billable procedures and treating other patients.  Critical care was necessary to treat or prevent imminent or life-threatening deterioration.  Critical care was time spent personally by me on the following activities: development of treatment plan with patient and/or surrogate as well as nursing, discussions with consultants, evaluation of patient's response to treatment, examination of patient, obtaining history from patient or surrogate, ordering and performing treatments and interventions, ordering and review of laboratory studies, ordering and review of radiographic studies, pulse oximetry and re-evaluation of patient's condition.   Length of Stay: 1  Loralie Champagne, MD  06/21/2020, 8:07 AM  Advanced Heart Failure Team Pager [phone] (M-F; 7a - 5p)  Please contact [facility] for night-coverage after hours (5p -7a ) and weekends on [email or web address]

## 2020-06-21 NOTE — Plan of Care (Signed)

## 2020-06-21 NOTE — Progress Notes (Signed)
3hrs post stopping Heparin and both Central line and IABP site are still oozing. TRBand is not oozing and therefore will try to remove slowly. Not restarting heparin at this time.

## 2020-06-21 NOTE — Progress Notes (Signed)
Peripherally Inserted Central Catheter Placement  The IV Nurse has discussed with the patient and/or persons authorized to consent for the patient, the purpose of this procedure and the potential benefits and risks involved with this procedure.  The benefits include less needle sticks, lab draws from the catheter, and the patient may be discharged home with the catheter. Risks include, but not limited to, infection, bleeding, blood clot (thrombus formation), and puncture of an artery; nerve damage and irregular heartbeat and possibility to perform a PICC exchange if needed/ordered by physician.  Alternatives to this procedure were also discussed.  Bard Power PICC patient education guide, fact sheet on infection prevention and patient information card has been provided to patient /or left at bedside.    PICC Placement Documentation  PICC Double Lumen 06/21/20 PICC Right Brachial 41 cm 0 cm (Active)  Indication for Insertion or Continuance of Line Vasoactive infusions 06/21/20 1458  Exposed Catheter (cm) 0 cm 06/21/20 1458  Site Assessment Dry;Clean;Intact 06/21/20 1458  Lumen #1 Status Flushed;Saline locked;Blood return noted 06/21/20 1458  Lumen #2 Status Flushed;Saline locked;Blood return noted 06/21/20 1458  Dressing Type Transparent 06/21/20 1458  Dressing Status Clean;Dry;Intact 06/21/20 1458  Antimicrobial disc in place? Yes 06/21/20 1458  Safety Lock Not Applicable 66/59/93 5701  Line Care Connections checked and tightened 06/21/20 1458  Line Adjustment (NICU/IV Team Only) No 06/21/20 1458  Dressing Intervention New dressing 06/21/20 1458  Dressing Change Due 06/28/20 06/21/20 1458       Rolena Infante 06/21/2020, 2:58 PM

## 2020-06-21 NOTE — Progress Notes (Signed)
At bedside for PICC placement.  Pt in procedure at this time in room.

## 2020-06-21 NOTE — Progress Notes (Addendum)
ANTICOAGULATION CONSULT NOTE - Follow Up Consult  Pharmacy Consult for IV heparin Indication: afib  Allergies  Allergen Reactions  . Penicillins     Sweating     Patient Measurements: Height: 5\' 5"  (165.1 cm) Weight: 84.6 kg (186 lb 8.2 oz) IBW/kg (Calculated) : 61.5 Heparin Dosing Weight: ~ 80 kg  Vital Signs: Temp: 98.96 F (37.2 C) (05/21 2100) BP: 98/78 (05/21 2100) Pulse Rate: 92 (05/21 2100)  Labs: Recent Labs    06/20/20 1335 06/20/20 1535 06/20/20 1815 06/20/20 2249 06/21/20 0352  HGB 11.5*  --   --  11.9* 11.6*  HCT 35.7*  --   --  36.8* 35.7*  PLT 141*  --   --  149* 144*  CREATININE 2.25*  --  2.27* 2.16* 2.06*  TROPONINIHS >27,000* >27,000*  --   --   --     Estimated Creatinine Clearance: 32.4 mL/min (A) (by C-G formula based on SCr of 2.06 mg/dL (H)).   Medications:  Infusions:  . sodium chloride 10 mL/hr at 06/20/20 1637  . sodium chloride    . sodium chloride Stopped (06/21/20 1300)  . sodium chloride 20 mL/hr at 06/21/20 1900  . amiodarone 30 mg/hr (06/21/20 1900)  . milrinone 0.5 mcg/kg/min (06/21/20 1900)  . norepinephrine (LEVOPHED) Adult infusion Stopped (06/20/20 1346)    Assessment: 70 yo male admitted for STEMI s/p cath with PCI and insertion of IABP. Patient is not on anticoagulation prior to admission.    He is now s/p IABP removal. Some bleeding noted after from the site and a thrombi-pad was placed.  Bleeding is still noted  Goal of Therapy:  Heparin level 0.3-0.5 Monitor platelets by anticoagulation protocol: Yes   Plan:  -No heparin now -Will reassess in the morning  Hildred Laser, PharmD Clinical Pharmacist **Pharmacist phone directory can now be found on [email or web address] ([email or web address]).  Listed under [place].

## 2020-06-21 NOTE — Progress Notes (Signed)
ANTICOAGULATION CONSULT NOTE - Follow Up Consult  Pharmacy Consult for IV heparin Indication: IABP  Allergies  Allergen Reactions  . Penicillins     Sweating     Patient Measurements: Height: 5\' 5"  (165.1 cm) Weight: 84.6 kg (186 lb 8.2 oz) IBW/kg (Calculated) : 61.5 Heparin Dosing Weight: ~ 80 kg  Vital Signs: Temp: 97.3 F (36.3 C) (05/21 0700) Temp Source: Core (05/21 0400) BP: 116/87 (05/21 0700) Pulse Rate: 65 (05/21 0700)  Labs: Recent Labs    06/20/20 1335 06/20/20 1535 06/20/20 1815 06/20/20 2249 06/21/20 0352  HGB 11.5*  --   --  11.9* 11.6*  HCT 35.7*  --   --  36.8* 35.7*  PLT 141*  --   --  149* 144*  CREATININE 2.25*  --  2.27* 2.16* 2.06*  TROPONINIHS >27,000* >27,000*  --   --   --     Estimated Creatinine Clearance: 32.4 mL/min (A) (by C-G formula based on SCr of 2.06 mg/dL (H)).   Medications:  Infusions:  . sodium chloride 10 mL/hr at 06/20/20 1637  . sodium chloride    . sodium chloride 10 mL/hr at 06/21/20 0700  . sodium chloride 10 mL/hr at 06/21/20 0700  . amiodarone 30 mg/hr (06/21/20 0700)  . milrinone 0.5 mcg/kg/min (06/21/20 0700)  . norepinephrine (LEVOPHED) Adult infusion Stopped (06/20/20 1346)    Assessment: 70 yo male admitted for STEMI s/p cath with PCI and insertion of IABP. Patient is not on anticoagulation prior to admission.    Heparin was started yesterday afternoon for IABP, but patient then developed significant bleeding from IV sites and heparin was held at 1030 PM.  Now with plans to try to pull IABP this AM.  Goal of Therapy:  Heparin level 0.2-0.5 Monitor platelets by anticoagulation protocol: Yes   Plan:  Continue holding IV heparin for now in anticipation of IABP removal. Will need DVT prophylaxis after IABP removed.  [place] Crane, Roylene Reason, BCCP Clinical Pharmacist  06/21/2020 8:14 AM   [facility] pharmacy phone numbers are listed on [email or web address]

## 2020-06-21 NOTE — Progress Notes (Signed)
8hrs post stopping Heparin and both Central line and IABP sites are still oozing. TRBand has been removed and a pressure dressing applied. Not restarting heparin at this time.

## 2020-06-21 NOTE — Progress Notes (Signed)
IABP removal Note  Pre Assessment- 1313  VS stable 121/76 HR- Afib 86  Right DP by Doppler  IABP 7.5 sheath right femoral artery oozing at the site marked by RN ,   IABP sheath removed at 1313 manual pressure held 30 minutes ,  BP 100/67 at 1323 patient remained stable, Right DP confirmed by doppler by Loma Sousa RN, no oozing noted, site Level 0, IABP catheter removed intact  Post assessment- 1345  VS stable 121/78 HR- AFfib 83  Right DP by Doppler  Right Femoral Site- Level 0- verified with Loma Sousa RN  Post instructions given to patient, patient verbally states understanding. Tegaderm placed.

## 2020-06-21 NOTE — Progress Notes (Signed)
  Echocardiogram 2D Echocardiogram has been performed.  Douglas Villa 06/21/2020, 8:54 AM

## 2020-06-21 NOTE — Progress Notes (Addendum)
Cath lab personnel removed IABP at 1345.

## 2020-06-22 LAB — CBC
HCT: 28.4 % — ABNORMAL LOW (ref 39.0–52.0)
HCT: 29 % — ABNORMAL LOW (ref 39.0–52.0)
Hemoglobin: 9.2 g/dL — ABNORMAL LOW (ref 13.0–17.0)
Hemoglobin: 9.3 g/dL — ABNORMAL LOW (ref 13.0–17.0)
MCH: 27.5 pg (ref 26.0–34.0)
MCH: 27.2 pg (ref 26.0–34.0)
MCHC: 32.4 g/dL (ref 30.0–36.0)
MCHC: 32.1 g/dL (ref 30.0–36.0)
MCV: 84.8 fL (ref 80.0–100.0)
MCV: 84.8 fL (ref 80.0–100.0)
Platelets: DECREASED 10*3/uL (ref 150–400)
Platelets: 101 10*3/uL — ABNORMAL LOW (ref 150–400)
RBC: 3.35 MIL/uL — ABNORMAL LOW (ref 4.22–5.81)
RBC: 3.42 MIL/uL — ABNORMAL LOW (ref 4.22–5.81)
RDW: 14.9 % (ref 11.5–15.5)
RDW: 14.9 % (ref 11.5–15.5)
WBC: 6.2 10*3/uL (ref 4.0–10.5)
WBC: 7.3 10*3/uL (ref 4.0–10.5)
nRBC: 0 % (ref 0.0–0.2)
nRBC: 0 % (ref 0.0–0.2)

## 2020-06-22 LAB — COMPREHENSIVE METABOLIC PANEL
ALT: 25 U/L (ref 0–44)
AST: 50 U/L — ABNORMAL HIGH (ref 15–41)
Albumin: 2.6 g/dL — ABNORMAL LOW (ref 3.5–5.0)
Alkaline Phosphatase: 70 U/L (ref 38–126)
Anion gap: 9 (ref 5–15)
BUN: 31 mg/dL — ABNORMAL HIGH (ref 8–23)
CO2: 26 mmol/L (ref 22–32)
Calcium: 7.6 mg/dL — ABNORMAL LOW (ref 8.9–10.3)
Chloride: 96 mmol/L — ABNORMAL LOW (ref 98–111)
Creatinine, Ser: 1.89 mg/dL — ABNORMAL HIGH (ref 0.61–1.24)
GFR, Estimated: 37 mL/min — ABNORMAL LOW (ref >60)
Glucose, Bld: 307 mg/dL — ABNORMAL HIGH (ref 70–99)
Potassium: 3.7 mmol/L (ref 3.5–5.1)
Sodium: 131 mmol/L — ABNORMAL LOW (ref 135–145)
Total Bilirubin: 1.5 mg/dL — ABNORMAL HIGH (ref 0.3–1.2)
Total Protein: 5.2 g/dL — ABNORMAL LOW (ref 6.5–8.1)

## 2020-06-22 LAB — COOXEMETRY PANEL
Carboxyhemoglobin: 1.2 % (ref 0.5–1.5)
Methemoglobin: 0.8 % (ref 0.0–1.5)
O2 Saturation: 69.5 %
Total hemoglobin: 9.4 g/dL — ABNORMAL LOW (ref 12.0–16.0)

## 2020-06-22 LAB — GLUCOSE, CAPILLARY
Glucose-Capillary: 256 mg/dL — ABNORMAL HIGH (ref 70–99)
Glucose-Capillary: 162 mg/dL — ABNORMAL HIGH (ref 70–99)
Glucose-Capillary: 190 mg/dL — ABNORMAL HIGH (ref 70–99)
Glucose-Capillary: 110 mg/dL — ABNORMAL HIGH (ref 70–99)
Glucose-Capillary: 168 mg/dL — ABNORMAL HIGH (ref 70–99)

## 2020-06-22 LAB — HEPARIN LEVEL (UNFRACTIONATED): Heparin Unfractionated: <0.1 IU/mL — ABNORMAL LOW (ref 0.30–0.70)

## 2020-06-22 MED ORDER — CLOPIDOGREL BISULFATE 300 MG PO TABS
600.0000 mg | ORAL_TABLET | Freq: Once | ORAL | Status: AC
  Administered 2020-06-22: 600 mg via ORAL
  Filled 2020-06-22: qty 2

## 2020-06-22 MED ORDER — ISOSORB DINITRATE-HYDRALAZINE 20-37.5 MG PO TABS
0.5000 | ORAL_TABLET | Freq: Three times a day (TID) | ORAL | Status: DC
  Administered 2020-06-22 – 2020-06-24 (×9): 0.5 via ORAL
  Filled 2020-06-22 (×9): qty 1

## 2020-06-22 MED ORDER — TORSEMIDE 20 MG PO TABS: 40.0000 mg | ORAL_TABLET | Freq: Every day | ORAL | Status: DC

## 2020-06-22 MED ORDER — TORSEMIDE 20 MG PO TABS
40.0000 mg | ORAL_TABLET | Freq: Every day | ORAL | Status: DC
  Administered 2020-06-22: 40 mg via ORAL
  Filled 2020-06-22: qty 2

## 2020-06-22 MED ORDER — POTASSIUM CHLORIDE CRYS ER 20 MEQ PO TBCR
40.0000 meq | EXTENDED_RELEASE_TABLET | Freq: Once | ORAL | Status: AC
  Administered 2020-06-22: 40 meq via ORAL
  Filled 2020-06-22: qty 2

## 2020-06-22 MED ORDER — HEPARIN (PORCINE) 25000 UT/250ML-% IV SOLN
900.0000 [IU]/h | INTRAVENOUS | Status: DC
  Administered 2020-06-22 – 2020-06-23 (×2): 1000 [IU]/h via INTRAVENOUS
  Filled 2020-06-22 (×2): qty 250

## 2020-06-22 MED ORDER — LIDOCAINE-EPINEPHRINE 1 %-1:100000 IJ SOLN
20.0000 mL | Freq: Once | INTRAMUSCULAR | Status: AC
  Administered 2020-06-22: 10 mL
  Filled 2020-06-22: qty 1

## 2020-06-22 MED ORDER — TORSEMIDE 20 MG PO TABS: 20.0000 mg | ORAL_TABLET | Freq: Every day | ORAL | Status: DC

## 2020-06-22 MED ORDER — DIGOXIN 125 MCG PO TABS
0.1250 mg | ORAL_TABLET | Freq: Every day | ORAL | Status: DC
  Administered 2020-06-22 – 2020-06-29 (×8): 0.125 mg via ORAL
  Filled 2020-06-22 (×8): qty 1

## 2020-06-22 MED ORDER — CLOPIDOGREL BISULFATE 75 MG PO TABS
75.0000 mg | ORAL_TABLET | Freq: Every day | ORAL | Status: DC
  Administered 2020-06-23 – 2020-06-29 (×7): 75 mg via ORAL
  Filled 2020-06-22 (×7): qty 1

## 2020-06-22 NOTE — Progress Notes (Addendum)
ANTICOAGULATION CONSULT NOTE - Follow Up Consult  Pharmacy Consult for IV heparin Indication: afib  Allergies  Allergen Reactions  . Penicillins     Sweating     Patient Measurements: Height: 5\' 5"  (165.1 cm) Weight: 84.1 kg (185 lb 6.5 oz) IBW/kg (Calculated) : 61.5 Heparin Dosing Weight: ~ 80 kg  Vital Signs: Temp: 98.7 F (37.1 C) (05/22 1600) Temp Source: Oral (05/22 1600) BP: 101/72 (05/22 1600) Pulse Rate: 108 (05/22 1600)  Labs: Recent Labs    06/20/20 1335 06/20/20 1535 06/20/20 1815 06/20/20 2249 06/21/20 0352 06/22/20 0505 06/22/20 0938 06/22/20 1710  HGB 11.5*  --   --  11.9* 11.6* 9.2* 9.3*  --   HCT 35.7*  --   --  36.8* 35.7* 28.4* 29.0*  --   PLT 141*  --   --  149* 144* PLATELET CLUMPS NOTED ON SMEAR, COUNT APPEARS DECREASED 101*  --   HEPARINUNFRC  --   --   --   --   --   --   --  <0.10*  CREATININE 2.25*  --    < > 2.16* 2.06* 1.89*  --   --   TROPONINIHS >27,000* >27,000*  --   --   --   --   --   --    < > = values in this interval not displayed.    Estimated Creatinine Clearance: 35.2 mL/min (A) (by C-G formula based on SCr of 1.89 mg/dL (H)).   Medications:  Infusions:  . sodium chloride 10 mL/hr at 06/20/20 1637  . sodium chloride    . sodium chloride Stopped (06/21/20 1300)  . sodium chloride 20 mL/hr at 06/22/20 1600  . amiodarone 30 mg/hr (06/22/20 1600)  . heparin    . milrinone 0.375 mcg/kg/min (06/22/20 1600)  . norepinephrine (LEVOPHED) Adult infusion Stopped (06/20/20 1346)    Assessment: 70 yo male admitted for STEMI s/p cath with PCI and insertion of IABP. Patient is not on anticoagulation prior to admission.    He is now s/p IABP removal 5/21 and plans were to start heparin this morning. Bleeding noted at the IABP insertion site this morning and heparin was held.  Bleeding has now stopped with plans to resume heparin  Hg= 9.3, plt= 101  Goal of Therapy:  Heparin level 0.3-0.5 Monitor platelets by anticoagulation  protocol: Yes   Plan:  -Start heparin at 1000 units/hr -Heparin level and CBC in am  Hildred Laser, PharmD Clinical Pharmacist **Pharmacist phone directory can now be found on [email or web address] ([email or web address]).  Listed under [place].

## 2020-06-22 NOTE — Progress Notes (Addendum)
Patient ID: Douglas Villa, male   DOB: [birth year], 70 y.o.   MRN: [id number]     Advanced Heart Failure Rounding Note  PCP-Cardiologist: None   Subjective:    Left IJ CVL and IABP removed yesterday, ongoing oozing from IJ line site.  Eventually, had stitch placed and oozing resolved.  Heparin gtt has been off.   Co-ox 70% on milrinone 0.5.  I/Os net negative 1236 cc with Lasix 80 mg IV x 1 yesterday.  CVP 8 this morning. Breathing improved, not on oxygen.  No chest pain.    He remains in atrial fibrillation on amiodarone gtt.   Echo: EF 20-25%, moderately decreased RV function with mild RVE, moderate biatrial enlargement, PASP 34, mild MR, mild TR, IVC normal.   Swan numbers: CVP 8 PA 56/25 CI 2.8 PAPI 3.8   Objective:   Weight Range: 84.1 kg Body mass index is 30.85 kg/m.   Vital Signs:   Temp:  [97.52 F (36.4 C)-98.96 F (37.2 C)] 98.24 F (36.8 C) (05/22 0600) Pulse Rate:  [85-165] 99 (05/22 0600) Resp:  [17-30] 22 (05/22 0600) BP: (91-138)/(60-96) 138/92 (05/22 0600) SpO2:  [98 %-100 %] 99 % (05/22 0600) Weight:  [84.1 kg] 84.1 kg (05/22 0500)    Weight change: Filed Weights   06/20/20 1236 06/21/20 0500 06/22/20 0500  Weight: 88.5 kg 84.6 kg 84.1 kg    Intake/Output:   Intake/Output Summary (Last 24 hours) at 06/22/2020 0754 Last data filed at 06/22/2020 0600 Gross per 24 hour  Intake 1613.84 ml  Output 2850 ml  Net -1236.16 ml      Physical Exam    General: NAD Neck: Thick. No JVD, no thyromegaly or thyroid nodule.  Lungs: Clear to auscultation bilaterally with normal respiratory effort. CV: Nondisplaced PMI.  Heart regular S1/S2, no S3/S4, no murmur.  No peripheral edema.  Abdomen: Soft, nontender, no hepatosplenomegaly, no distention.  Skin: Intact without lesions or rashes.  Neurologic: Alert and oriented x 3.  Psych: Normal affect. Extremities: No clubbing or cyanosis.  HEENT: Normal.    Telemetry   Atrial flutter 90s-100s  (personally reviewed)   Labs    CBC Recent Labs    06/21/20 0352 06/22/20 0505  WBC 8.4 6.2  HGB 11.6* 9.2*  HCT 35.7* 28.4*  MCV 83.4 84.8  PLT 144* PLATELET CLUMPS NOTED ON SMEAR, COUNT APPEARS DECREASED   Basic Metabolic Panel Recent Labs    06/20/20 1815 06/20/20 2249 06/21/20 0352 06/22/20 0505  NA 132*   < > 136 131*  K 3.0*   < > 3.6 3.7  CL 96*   < > 98 96*  CO2 26   < > 28 26  GLUCOSE 203*   < > 169* 307*  BUN 44*   < > 39* 31*  CREATININE 2.27*   < > 2.06* 1.89*  CALCIUM 8.4*   < > 8.6* 7.6*  MG 2.4  --  2.2  --    < > = values in this interval not displayed.   Liver Function Tests Recent Labs    06/22/20 0505  AST 50*  ALT 25  ALKPHOS 70  BILITOT 1.5*  PROT 5.2*  ALBUMIN 2.6*   No results for input(s): LIPASE, AMYLASE in the last 72 hours. Cardiac Enzymes No results for input(s): CKTOTAL, CKMB, CKMBINDEX, TROPONINI in the last 72 hours.  BNP: BNP (last 3 results) No results for input(s): BNP in the last 8760 hours.  ProBNP (last 3 results) No results for  input(s): PROBNP in the last 8760 hours.   D-Dimer No results for input(s): DDIMER in the last 72 hours. Hemoglobin A1C Recent Labs    06/21/20 0918  HGBA1C 7.7*   Fasting Lipid Panel No results for input(s): CHOL, HDL, LDLCALC, TRIG, CHOLHDL, LDLDIRECT in the last 72 hours. Thyroid Function Tests No results for input(s): TSH, T4TOTAL, T3FREE, THYROIDAB in the last 72 hours.  Invalid input(s): FREET3  Other results:   Imaging    DG CHEST PORT 1 VIEW  Result Date: 06/21/2020 CLINICAL DATA:  PICC line placement.  Acute STEMI. EXAM: PORTABLE CHEST 1 VIEW COMPARISON:  06/20/2020 FINDINGS: New right arm PICC line is seen with tip overlying the superior cavoatrial junction. Left jugular central venous catheter and Swan-Ganz catheter remain in appropriate position. No evidence of pneumothorax. Mild cardiomegaly remains stable. Aortic atherosclerotic calcification noted. Both lungs  are clear. IMPRESSION: New right arm PICC line tip overlies the superior cavoatrial junction. Stable cardiomegaly.  No active lung disease. Electronically Signed   By: Marlaine Hind M.D.   On: 06/21/2020 16:09   ECHOCARDIOGRAM COMPLETE  Result Date: 06/21/2020    ECHOCARDIOGRAM REPORT   Patient Name:   Douglas Villa Date of Exam: 06/21/2020 Medical Rec #:  [id number]         Height:       65.0 in Accession #:    [id number]        Weight:       186.5 lb Date of Birth:  [birth year]          BSA:          1.920 m Patient Age:    36 years          BP:           116/87 mmHg Patient Gender: M                 HR:           88 bpm. Exam Location:  Inpatient Procedure: 2D Echo Indications:    acute systolic chf  History:        Patient has prior history of Echocardiogram examinations, most                 recent 06/20/2020. CAD, balloon pump present, COPD and chronic                 kidney disease. Shock; Risk Factors:Sleep Apnea and Diabetes.  Sonographer:    Johny Chess Referring Phys: [place]  1. Left ventricular ejection fraction, by estimation, is 20 to 25%. The left ventricle has severely decreased function. The left ventricle demonstrates regional wall motion abnormalities (see scoring diagram/findings for description). The left ventricular internal cavity size was mildly dilated. Left ventricular diastolic function could not be evaluated.  2. Right ventricular systolic function is moderately reduced. The right ventricular size is mildly enlarged. There is normal pulmonary artery systolic pressure. The estimated right ventricular systolic pressure is 17.5 mmHg.  3. Left atrial size was moderately dilated.  4. Right atrial size was moderately dilated.  5. The mitral valve is degenerative. Mild mitral valve regurgitation. No evidence of mitral stenosis.  6. The aortic valve is tricuspid. Aortic valve regurgitation is not visualized. Mild aortic valve sclerosis is present, with no  evidence of aortic valve stenosis.  7. The inferior vena cava is normal in size with greater than 50% respiratory variability, suggesting right atrial pressure of 3 mmHg. Comparison(s): Changes  from prior study are noted. LVEF has improved up to 20-25%. Apical akinesis. RV function has improved. TR has improved. FINDINGS  Left Ventricle: Left ventricular ejection fraction, by estimation, is 20 to 25%. The left ventricle has severely decreased function. The left ventricle demonstrates regional wall motion abnormalities. The left ventricular internal cavity size was mildly  dilated. There is no left ventricular hypertrophy. Left ventricular diastolic function could not be evaluated due to atrial fibrillation. Left ventricular diastolic function could not be evaluated.  LV Wall Scoring: The apical inferior segment and apex are akinetic. Right Ventricle: The right ventricular size is mildly enlarged. No increase in right ventricular wall thickness. Right ventricular systolic function is moderately reduced. There is normal pulmonary artery systolic pressure. The tricuspid regurgitant velocity is 2.80 m/s, and with an assumed right atrial pressure of 3 mmHg, the estimated right ventricular systolic pressure is 71.2 mmHg. Left Atrium: Left atrial size was moderately dilated. Right Atrium: Right atrial size was moderately dilated. Pericardium: Trivial pericardial effusion is present. Mitral Valve: The mitral valve is degenerative in appearance. Mild mitral valve regurgitation. No evidence of mitral valve stenosis. Tricuspid Valve: The tricuspid valve is grossly normal. Tricuspid valve regurgitation is mild . No evidence of tricuspid stenosis. Aortic Valve: The aortic valve is tricuspid. Aortic valve regurgitation is not visualized. Mild aortic valve sclerosis is present, with no evidence of aortic valve stenosis. Pulmonic Valve: The pulmonic valve was grossly normal. Pulmonic valve regurgitation is not visualized. No  evidence of pulmonic stenosis. Aorta: The aortic root and ascending aorta are structurally normal, with no evidence of dilitation. Venous: The inferior vena cava is normal in size with greater than 50% respiratory variability, suggesting right atrial pressure of 3 mmHg. IAS/Shunts: The atrial septum is grossly normal. Additional Comments: A venous catheter is visualized in the right atrium and right ventricle.  LEFT VENTRICLE PLAX 2D LVIDd:         6.10 cm LVIDs:         5.70 cm LV PW:         0.90 cm LV IVS:        0.90 cm LVOT diam:     1.90 cm LVOT Area:     2.84 cm  RIGHT VENTRICLE             IVC RV S prime:     11.50 cm/s  IVC diam: 1.20 cm LEFT ATRIUM              Index       RIGHT ATRIUM           Index LA diam:        4.70 cm  2.45 cm/m  RA Area:     25.60 cm LA Vol (A2C):   105.0 ml 54.68 ml/m RA Volume:   89.50 ml  46.61 ml/m LA Vol (A4C):   85.7 ml  44.63 ml/m LA Biplane Vol: 95.6 ml  49.78 ml/m   AORTA Ao Root diam: 3.40 cm Ao Asc diam:  3.40 cm TRICUSPID VALVE TR Peak grad:   31.4 mmHg TR Vmax:        280.00 cm/s  SHUNTS Systemic Diam: 1.90 cm Eleonore Chiquito MD Electronically signed by Eleonore Chiquito MD Signature Date/Time: 06/21/2020/1:20:49 PM    Final    [place] EKG SITE RITE  Result Date: 06/21/2020 If Site Rite image not attached, placement could not be confirmed due to current cardiac rhythm.    Medications:     Scheduled  Medications: . aspirin  81 mg Oral Daily  . atorvastatin  80 mg Oral q1800  . Chlorhexidine Gluconate Cloth  6 each Topical Daily  . digoxin  0.125 mg Oral Daily  . insulin aspart  0-15 Units Subcutaneous TID WC  . insulin aspart  0-5 Units Subcutaneous QHS  . isosorbide-hydrALAZINE  0.5 tablet Oral TID  . potassium chloride  40 mEq Oral Once  . sodium chloride flush  10-40 mL Intracatheter Q12H  . sodium chloride flush  10-40 mL Intracatheter Q12H  . sodium chloride flush  3 mL Intravenous Q12H  . ticagrelor  90 mg Oral BID  . torsemide  20 mg Oral Daily     Infusions: . sodium chloride 10 mL/hr at 06/20/20 1637  . sodium chloride    . sodium chloride Stopped (06/21/20 1300)  . sodium chloride 20 mL/hr at 06/22/20 0600  . amiodarone 30 mg/hr (06/22/20 0600)  . heparin    . milrinone 0.5 mcg/kg/min (06/22/20 0600)  . norepinephrine (LEVOPHED) Adult infusion Stopped (06/20/20 1346)    PRN Medications: sodium chloride, sodium chloride, acetaminophen, ondansetron (ZOFRAN) IV, sodium chloride flush, sodium chloride flush, sodium chloride flush    Assessment/Plan   1. CAD: H/o DES to LCX in 3/19, had occluded PLV at that time. Admitted with anterior STEMI and occluded mLAD, had DES to mLAD, known TO PLV with collaterals and patent LCx stent.  No chest pain.  - Continue ASA 81, ticagrelor => eventual transition to Plavix as he will need anticoagulation with AFL.  - Continue atorvastatin 80.  2. CKD: Stage 3, creatinine seems to be around 2 at baseline.  Creatinine actually down to 1.89 this morning.  3. 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  Echo this admission EF 20-25%, moderately decreased RV function with mild RVE, moderate biatrial enlargement, PASP 34, mild MR, mild TR, IVC normal.   This morning, on milrinone 0.5, IABP out.  Co-ox 70% with CI 2.4 and co-ox 70%.  Good diuresis yesterday, CVP 8 today.  - Decrease milrinone to 0.375. - Remove Swan, follow CVP and co-ox from PICC.  - Torsemide 40 mg po daily . - Bidil 1/2 tab tid. - digoxin 0.125 daily.   4. COPD: Prior smoker.  5. Type 2 diabetes: SSI for now, hgbA1c 7.7.   6. Atrial flutter: Newly noted.  Remains in AFL with controlled rate this morning.   - Continue amiodarone gtt 30 mg/hr.  - Restarting heparin gtt, needs DOAC eventually.  TEE-DCCV eventually if remains in AFL.  7. Tricuspid regurgitation: Only mild on repeat complete echo.    CRITICAL CARE Performed  by: Loralie Champagne  Total critical care time: 35 minutes  Critical care time was exclusive of separately billable procedures and treating other patients.  Critical care was necessary to treat or prevent imminent or life-threatening deterioration.  Critical care was time spent personally by me on the following activities: development of treatment plan with patient and/or surrogate as well as nursing, discussions with consultants, evaluation of patient's response to treatment, examination of patient, obtaining history from patient or surrogate, ordering and performing treatments and interventions, ordering and review of laboratory studies, ordering and review of radiographic studies, pulse oximetry and re-evaluation of patient's condition.   Length of Stay: 2  Loralie Champagne, MD  06/22/2020, 7:54 AM  Advanced Heart Failure Team Pager [phone] (M-F; 7a - 5p)  Please contact  [facility] for night-coverage after hours (5p -7a ) and weekends on [email or web address]  Femoral venous sheath removed and arterial bleeding started again from the IABP site (removed yesterday).  Nurses holding pressure, may need Femstop.  Hold off on starting heparin gtt until later today when bleeding has completely ceased.   Resend CBC, unable to get accurate platelet count on this morning's CBC.    Loralie Champagne 06/22/2020 8:38 AM

## 2020-06-22 NOTE — Progress Notes (Signed)
Paged by RN regarding ongoing bleeding from prior L CVL site. Assessed and patient with steady venous blood from puncture site saturated multiple dressings. Injected 10 cc 1:200,000 lido SQ around site and placed 6-0 stitch through hole. Hemostasis achieved without additional dressings.

## 2020-06-22 NOTE — Progress Notes (Signed)
During venous sheath pull patient's IABP insertion site began to re-bleed. Dr. Aundra Dubin notified, pressure held for 20 minutes. Bleeding stopped, site dressed.

## 2020-06-23 ENCOUNTER — Encounter (HOSPITAL_COMMUNITY): Payer: Self-pay | Admitting: Cardiovascular Disease

## 2020-06-23 LAB — CBC
HCT: 29.4 % — ABNORMAL LOW (ref 39.0–52.0)
Hemoglobin: 9.5 g/dL — ABNORMAL LOW (ref 13.0–17.0)
MCH: 27.2 pg (ref 26.0–34.0)
MCHC: 32.3 g/dL (ref 30.0–36.0)
MCV: 84.2 fL (ref 80.0–100.0)
Platelets: 111 10*3/uL — ABNORMAL LOW (ref 150–400)
RBC: 3.49 MIL/uL — ABNORMAL LOW (ref 4.22–5.81)
RDW: 14.8 % (ref 11.5–15.5)
WBC: 8.1 10*3/uL (ref 4.0–10.5)
nRBC: 0 % (ref 0.0–0.2)

## 2020-06-23 LAB — COOXEMETRY PANEL
Carboxyhemoglobin: 1.1 % (ref 0.5–1.5)
Carboxyhemoglobin: 1 % (ref 0.5–1.5)
Carboxyhemoglobin: 1 % (ref 0.5–1.5)
Methemoglobin: 1.1 % (ref 0.0–1.5)
Methemoglobin: 1 % (ref 0.0–1.5)
Methemoglobin: 0.6 % (ref 0.0–1.5)
O2 Saturation: 49.4 %
O2 Saturation: 44.7 %
O2 Saturation: 54.7 %
Total hemoglobin: 9.7 g/dL — ABNORMAL LOW (ref 12.0–16.0)
Total hemoglobin: 9.8 g/dL — ABNORMAL LOW (ref 12.0–16.0)
Total hemoglobin: 9.6 g/dL — ABNORMAL LOW (ref 12.0–16.0)

## 2020-06-23 LAB — COMPREHENSIVE METABOLIC PANEL
ALT: 26 U/L (ref 0–44)
AST: 40 U/L (ref 15–41)
Albumin: 2.6 g/dL — ABNORMAL LOW (ref 3.5–5.0)
Alkaline Phosphatase: 72 U/L (ref 38–126)
Anion gap: 6 (ref 5–15)
BUN: 28 mg/dL — ABNORMAL HIGH (ref 8–23)
CO2: 24 mmol/L (ref 22–32)
Calcium: 7.8 mg/dL — ABNORMAL LOW (ref 8.9–10.3)
Chloride: 99 mmol/L (ref 98–111)
Creatinine, Ser: 1.99 mg/dL — ABNORMAL HIGH (ref 0.61–1.24)
GFR, Estimated: 35 mL/min — ABNORMAL LOW (ref >60)
Glucose, Bld: 305 mg/dL — ABNORMAL HIGH (ref 70–99)
Potassium: 4.3 mmol/L (ref 3.5–5.1)
Sodium: 129 mmol/L — ABNORMAL LOW (ref 135–145)
Total Bilirubin: 1.6 mg/dL — ABNORMAL HIGH (ref 0.3–1.2)
Total Protein: 5.5 g/dL — ABNORMAL LOW (ref 6.5–8.1)

## 2020-06-23 LAB — GLUCOSE, CAPILLARY
Glucose-Capillary: 188 mg/dL — ABNORMAL HIGH (ref 70–99)
Glucose-Capillary: 213 mg/dL — ABNORMAL HIGH (ref 70–99)
Glucose-Capillary: 135 mg/dL — ABNORMAL HIGH (ref 70–99)
Glucose-Capillary: 148 mg/dL — ABNORMAL HIGH (ref 70–99)

## 2020-06-23 LAB — POCT I-STAT 7, (LYTES, BLD GAS, ICA,H+H)
Acid-base deficit: 7 mmol/L — ABNORMAL HIGH (ref 0.0–2.0)
Bicarbonate: 19.3 mmol/L — ABNORMAL LOW (ref 20.0–28.0)
Calcium, Ion: 1.01 mmol/L — ABNORMAL LOW (ref 1.15–1.40)
HCT: 43 % (ref 39.0–52.0)
Hemoglobin: 14.6 g/dL (ref 13.0–17.0)
O2 Saturation: 99 %
Potassium: 3.4 mmol/L — ABNORMAL LOW (ref 3.5–5.1)
Sodium: 118 mmol/L — CL (ref 135–145)
TCO2: 20 mmol/L — ABNORMAL LOW (ref 22–32)
pCO2 arterial: 38.8 mmHg (ref 32.0–48.0)
pH, Arterial: 7.304 — ABNORMAL LOW (ref 7.350–7.450)
pO2, Arterial: 169 mmHg — ABNORMAL HIGH (ref 83.0–108.0)

## 2020-06-23 LAB — HEPARIN LEVEL (UNFRACTIONATED)
Heparin Unfractionated: 0.28 IU/mL — ABNORMAL LOW (ref 0.30–0.70)
Heparin Unfractionated: 0.33 IU/mL (ref 0.30–0.70)

## 2020-06-23 MED ORDER — AMIODARONE LOAD VIA INFUSION
150.0000 mg | Freq: Once | INTRAVENOUS | Status: AC
  Administered 2020-06-23: 150 mg via INTRAVENOUS
  Filled 2020-06-23: qty 83.34

## 2020-06-23 MED ORDER — MILRINONE LACTATE IN DEXTROSE 20-5 MG/100ML-% IV SOLN
0.1250 ug/kg/min | INTRAVENOUS | Status: DC
  Administered 2020-06-23 (×2): 0.375 ug/kg/min via INTRAVENOUS
  Administered 2020-06-23: 0.5 ug/kg/min via INTRAVENOUS
  Administered 2020-06-24: 0.25 ug/kg/min via INTRAVENOUS
  Administered 2020-06-24: 0.375 ug/kg/min via INTRAVENOUS
  Administered 2020-06-25 – 2020-06-27 (×2): 0.125 ug/kg/min via INTRAVENOUS
  Filled 2020-06-23 (×6): qty 100

## 2020-06-23 MED FILL — Lidocaine HCl Local Preservative Free (PF) Inj 1%: INTRAMUSCULAR | Qty: 30 | Status: AC

## 2020-06-23 MED FILL — Amiodarone HCl Inj 150 MG/3ML (50 MG/ML): INTRAVENOUS | Qty: 3 | Status: AC

## 2020-06-23 NOTE — Progress Notes (Addendum)
Patient ID: Douglas Villa, male   DOB: March 08, 1950, 70 y.o.   MRN: [id number]     Advanced Heart Failure Rounding Note  PCP-Cardiologist: None   Subjective:    5/21 Left IJ CVL and IABP removed   Remains on amio and heparin drip. A flutter 90-120s   Earlier this morning CO-OX 44% so  milrinone increased back up to 0.5 mcg  Feels ok.   Objective:   Weight Range: 82.6 kg Body mass index is 30.32 kg/m.   Vital Signs:   Temp:  [98.42 F (36.9 C)-99.3 F (37.4 C)] 98.5 F (36.9 C) (05/23 0650) Pulse Rate:  [87-123] 109 (05/23 0400) Resp:  [15-33] 28 (05/23 0400) BP: (101-133)/(61-107) 114/64 (05/23 0400) SpO2:  [62 %-100 %] 100 % (05/23 0400) Weight:  [82.6 kg] 82.6 kg (05/23 0500)    Weight change: Filed Weights   06/21/20 0500 06/22/20 0500 06/23/20 0500  Weight: 84.6 kg 84.1 kg 82.6 kg    Intake/Output:   Intake/Output Summary (Last 24 hours) at 06/23/2020 0735 Last data filed at 06/23/2020 0400 Gross per 24 hour  Intake 1489.26 ml  Output 1950 ml  Net -460.74 ml      Physical Exam   CVP 1-2  General:  Sitting in the chair.  No resp difficulty HEENT: normal Neck: supple. no JVD. Carotids 2+ bilat; no bruits. No lymphadenopathy or thryomegaly appreciated. Cor: PMI nondisplaced. Tachy Regular rate & rhythm. No rubs, gallops or murmurs. Lungs: clear Abdomen: soft, nontender, nondistended. No hepatosplenomegaly. No bruits or masses. Good bowel sounds. Extremities: no cyanosis, clubbing, rash, edema Neuro: alert & orientedx3, cranial nerves grossly intact. moves all 4 extremities w/o difficulty. Affect pleasant   Telemetry    A flutter 90-120s with occasional PVCs.    Labs    CBC Recent Labs    06/22/20 0938 06/23/20 0356  WBC 7.3 8.1  HGB 9.3* 9.5*  HCT 29.0* 29.4*  MCV 84.8 84.2  PLT 101* 496*   Basic Metabolic Panel Recent Labs    06/20/20 1815 06/20/20 2249 06/21/20 0352 06/22/20 0505 06/23/20 0356  NA 132*   < > 136 131* 129*  K  3.0*   < > 3.6 3.7 4.3  CL 96*   < > 98 96* 99  CO2 26   < > 28 26 24   GLUCOSE 203*   < > 169* 307* 305*  BUN 44*   < > 39* 31* 28*  CREATININE 2.27*   < > 2.06* 1.89* 1.99*  CALCIUM 8.4*   < > 8.6* 7.6* 7.8*  MG 2.4  --  2.2  --   --    < > = values in this interval not displayed.   Liver Function Tests Recent Labs    06/22/20 0505 06/23/20 0356  AST 50* 40  ALT 25 26  ALKPHOS 70 72  BILITOT 1.5* 1.6*  PROT 5.2* 5.5*  ALBUMIN 2.6* 2.6*   No results for input(s): LIPASE, AMYLASE in the last 72 hours. Cardiac Enzymes No results for input(s): CKTOTAL, CKMB, CKMBINDEX, TROPONINI in the last 72 hours.  BNP: BNP (last 3 results) No results for input(s): BNP in the last 8760 hours.  ProBNP (last 3 results) No results for input(s): PROBNP in the last 8760 hours.   D-Dimer No results for input(s): DDIMER in the last 72 hours. Hemoglobin A1C Recent Labs    06/21/20 0918  HGBA1C 7.7*   Fasting Lipid Panel No results for input(s): CHOL, HDL, LDLCALC, TRIG, CHOLHDL, LDLDIRECT  in the last 72 hours. Thyroid Function Tests No results for input(s): TSH, T4TOTAL, T3FREE, THYROIDAB in the last 72 hours.  Invalid input(s): FREET3  Other results:   Imaging    No results found.   Medications:     Scheduled Medications: . aspirin  81 mg Oral Daily  . atorvastatin  80 mg Oral q1800  . Chlorhexidine Gluconate Cloth  6 each Topical Daily  . clopidogrel  75 mg Oral Daily  . digoxin  0.125 mg Oral Daily  . insulin aspart  0-15 Units Subcutaneous TID WC  . insulin aspart  0-5 Units Subcutaneous QHS  . isosorbide-hydrALAZINE  0.5 tablet Oral TID  . sodium chloride flush  10-40 mL Intracatheter Q12H  . sodium chloride flush  3 mL Intravenous Q12H  . torsemide  40 mg Oral Daily    Infusions: . sodium chloride 10 mL/hr at 06/20/20 1637  . sodium chloride    . sodium chloride Stopped (06/21/20 1300)  . sodium chloride 20 mL/hr at 06/23/20 0400  . amiodarone 30 mg/hr  (06/23/20 0400)  . heparin 1,000 Units/hr (06/23/20 0400)  . milrinone 0.5 mcg/kg/min (06/23/20 9417)  . norepinephrine (LEVOPHED) Adult infusion Stopped (06/20/20 1346)    PRN Medications: sodium chloride, sodium chloride, acetaminophen, ondansetron (ZOFRAN) IV, sodium chloride flush, sodium chloride flush    Assessment/Plan   1. CAD: H/o DES to LCX in 3/19, had occluded PLV at that time. Admitted with anterior STEMI and occluded mLAD, had DES to mLAD, known TO PLV with collaterals and patent LCx stent.  No chest pain.  - Continue ASA 81, ticagrelor => eventual transition to Plavix as he will need anticoagulation with AFL.  - Continue atorvastatin 80.  2. CKD: Stage 3, creatinine seems to be around 2 at baseline.  Creatinine 2 today.   3. 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  Echo this admission EF 20-25%, moderately decreased RV function with mild RVE, moderate biatrial enlargement, PASP 34, mild MR, mild TR, IVC normal.    - IABP out 5/22.  - CO-OX  Down to 44%. Milrinone increased to 0.5 mcg. Repeat CO-OX at 1200  - CVP 2. Hold diuretics.  - Bidil 1/2 tab tid--> will need to split to hydralazine/imdur at d/c  - digoxin 0.125 daily.   4. COPD: Prior smoker.  5. Type 2 diabetes: SSI for now, hgbA1c 7.7.   6. Atrial flutter: Newly noted.  Remains in AFL with rate 110-120 - Give 150 mg bolus now.  - Continue amiodarone gtt 30 mg/hr.  - Continue heparin gtt, needs DOAC eventually.  TEE-DCCV eventually if remains in AFL. Ideally would be on lower dose of milrinone .  - Watch platelets 101-->111 7. Tricuspid regurgitation: Only mild on repeat complete echo.   8. Hyponatremia Sodium trending down 129 , restrict free water 9. Thrombocytopenia: Mild at admission.   Consult PT/Cardiac rehab.   He has Medicare A& B.  At discharge will need to split bidil to hydralazine/imdur.  Will look at [facility] coverage and can start patient assistance for eliquis.   Length of Stay: 3  Amy Clegg, NP  06/23/2020, 7:35 AM  Advanced Heart Failure Team Pager [phone] (M-F; 7a - 5p)  Please contact [facility] for night-coverage after hours (5p -7a ) and weekends on [email or web address]  Patient seen with NP, agree with the above note.   Today, HR in 120s on amiodarone  30 and milrinone 0.5.  Co-ox was 45% in early am on milrinone 0.375.  Creatinine 1.99.  CVP.  Sitting up in chair, no complaints.  No further bleeding and now on heparin gtt.   General: NAD Neck: No JVD, no thyromegaly or thyroid nodule.  Lungs: Clear to auscultation bilaterally with normal respiratory effort. CV: Lateral PMI.  Heart tachy, irregular S1/S2, no S3/S4, no murmur.  No peripheral edema.   Abdomen: Soft, nontender, no hepatosplenomegaly, no distention.  Skin: Intact without lesions or rashes.  Neurologic: Alert and oriented x 3.  Psych: Normal affect. Extremities: No clubbing or cyanosis.  HEENT: Normal.   Slow HR, increase amiodarone gtt to 60 and will try to drop milrinone back to 0.375.  Repeat co-ox now that he is awake and with lower HR once amiodarone increased.    No diuretic today with low CVP.  Continue Bidil and digoxin.   Continue heparin gtt and amiodarone for atrial flutter, eventual DCCV but need him on lower milrinone first.   No further bleeding from old line sites.   CRITICAL CARE Performed by: Loralie Champagne  Total critical care time: 40 minutes  Critical care time was exclusive of separately billable procedures and treating other patients.  Critical care was necessary to treat or prevent imminent or life-threatening deterioration.  Critical care was time spent personally by me on the following activities: development of treatment plan with patient and/or surrogate as well as nursing, discussions with consultants, evaluation of patient's response to treatment, examination of  patient, obtaining history from patient or surrogate, ordering and performing treatments and interventions, ordering and review of laboratory studies, ordering and review of radiographic studies, pulse oximetry and re-evaluation of patient's condition.  Loralie Champagne 06/23/2020 8:04 AM

## 2020-06-23 NOTE — Progress Notes (Signed)
ANTICOAGULATION CONSULT NOTE - Follow Up Consult  Pharmacy Consult for IV heparin Indication: afib  Allergies  Allergen Reactions  . Penicillins     Sweating     Patient Measurements: Height: 5\' 5"  (165.1 cm) Weight: 84.1 kg (185 lb 6.5 oz) IBW/kg (Calculated) : 61.5 Heparin Dosing Weight: ~ 80 kg  Vital Signs: Temp: 99.1 F (37.3 C) (05/23 0351) Temp Source: Oral (05/23 0351) BP: 114/64 (05/23 0400) Pulse Rate: 109 (05/23 0400)  Labs: Recent Labs    06/20/20 1335 06/20/20 1535 06/20/20 1815 06/20/20 2249 06/21/20 0352 06/22/20 0505 06/22/20 0938 06/22/20 1710 06/23/20 0356  HGB 11.5*  --   --  11.9* 11.6* 9.2* 9.3*  --  9.5*  HCT 35.7*  --   --  36.8* 35.7* 28.4* 29.0*  --  29.4*  PLT 141*  --   --  149* 144* PLATELET CLUMPS NOTED ON SMEAR, COUNT APPEARS DECREASED 101*  --  111*  HEPARINUNFRC  --   --   --   --   --   --   --  <0.10* 0.28*  CREATININE 2.25*  --    < > 2.16* 2.06* 1.89*  --   --   --   TROPONINIHS >27,000* >27,000*  --   --   --   --   --   --   --    < > = values in this interval not displayed.    Estimated Creatinine Clearance: 35.2 mL/min (A) (by C-G formula based on SCr of 1.89 mg/dL (H)).   Assessment: 70 yo male admitted for STEMI s/p cath with PCI and insertion of IABP. Patient is not on anticoagulation prior to admission.    He is now s/p IABP removal 5/21;bleeding noted at the IABP insertion site so heparin was held and restarted 5/22 p.m.  Heparin level slightly subtherapeutic (0.28) on gtt at 1000 units/hr. RN went in at 0130 this morning and heparin line was out - unsure how long it was out.  Goal of Therapy:  Heparin level 0.3-0.5 units/ml Monitor platelets by anticoagulation protocol: Yes   Plan:  Continue heparin at 1000 units/hr F/u 8 hr heparin level from when restart  Sherlon Handing, PharmD, BCPS Please see amion for complete clinical pharmacist phone list 06/23/2020 4:39 AM

## 2020-06-23 NOTE — Progress Notes (Signed)
ANTICOAGULATION CONSULT NOTE - Follow Up Consult  Pharmacy Consult for IV heparin Indication: afib  Allergies  Allergen Reactions  . Penicillins     Sweating     Patient Measurements: Height: 5\' 5"  (165.1 cm) Weight: 82.6 kg (182 lb 3.2 oz) IBW/kg (Calculated) : 61.5 Heparin Dosing Weight: ~ 80 kg  Vital Signs: Temp: 98.5 F (36.9 C) (05/23 0650) Temp Source: Oral (05/23 0650) BP: 113/72 (05/23 1000) Pulse Rate: 103 (05/23 1000)  Labs: Recent Labs    06/20/20 1335 06/20/20 1535 06/20/20 1815 06/21/20 0352 06/22/20 0505 06/22/20 0938 06/22/20 1710 06/23/20 0356 06/23/20 0927  HGB 11.5*  --    < > 11.6* 9.2* 9.3*  --  9.5*  --   HCT 35.7*  --    < > 35.7* 28.4* 29.0*  --  29.4*  --   PLT 141*  --    < > 144* PLATELET CLUMPS NOTED ON SMEAR, COUNT APPEARS DECREASED 101*  --  111*  --   HEPARINUNFRC  --   --   --   --   --   --  <0.10* 0.28* 0.33  CREATININE 2.25*  --    < > 2.06* 1.89*  --   --  1.99*  --   TROPONINIHS >27,000* >27,000*  --   --   --   --   --   --   --    < > = values in this interval not displayed.    Estimated Creatinine Clearance: 33.2 mL/min (A) (by C-G formula based on SCr of 1.99 mg/dL (H)).   Assessment: 70 yo male admitted for STEMI s/p cath with PCI and insertion of IABP. Patient is not on anticoagulation prior to admission.  He is now s/p IABP removal 5/21; bleeding noted at the IABP insertion site so heparin was held and restarted 5/22 p.m. No bleeding this AM per patient or RN.   Heparin level now therapeutic (0.33) on gtt at 1000 units/hr CBC Hgb 9.5, plt 111 (uptrending)  Goal of Therapy:  Heparin level 0.3-0.5 units/ml Monitor platelets by anticoagulation protocol: Yes   Plan:  Continue heparin at 1000 units/hr F/u daily HL, CBC, s/sx bleeding F/u transition to oral agents as appropriate   Wilson Singer, PharmD PGY1 Pharmacy Resident 06/23/2020 11:06 AM

## 2020-06-23 NOTE — Progress Notes (Signed)
Per Dr Aundra Dubin, increase Milrinone to 0.5 from previous 0.375 d/t decrease in Co-Ox this morning. Repeat Co-Ox lab placed as well.

## 2020-06-23 NOTE — Progress Notes (Signed)
CARDIAC REHAB PHASE I   Went to offer to walk with pt, pt recently up with PT. Pt states he is feeling better today. MI and HF education completed with pt. Pt educated on importance of ASA, Brilinta, and NTG. Pt given MI book, stent card, HF booklet, and heart healthy diet. Reviewed site care, restrictions, and exercise guidelines. Will refer to CRP II [place]. Pt denies further questions or concerns at this time. Will continue to follow.  [id number] Rufina Falco, RN BSN 06/23/2020 2:24 PM

## 2020-06-23 NOTE — Evaluation (Addendum)
Physical Therapy Evaluation/ Discharge Patient Details Name: Douglas Villa MRN: [id number] DOB: 06-19-1950 Today's Date: 06/23/2020   History of Present Illness  70 yo admitted 5/20 with SOB with EKG showing STEMI s/p cath with PCI and IABP with EF<20% with cath. 5/21 IABP removed. PMhx: CAD, CHF, HTN, COPD, OSA, COPD  Clinical Impression  Pt pleasant and reports being very sedentary since no longer working after CHF diagnosis. Pt reports eating a lot of lean cuisine meals and prepacked items. Pt with good mobility, transfers and gait without current need for further physical therapy intervention. Pt educated for walking program and heart healthy diet which he reports already receiving handouts for. Pt stating he is going to make a commitment to change and perform walking program with ability to state plan. Will sign off with pt aware and agreeable and daily ambulation acutely encouraged.   HR 108-115 SpO2 99% on RA 123/79 (90)    Follow Up Recommendations No PT follow up    Equipment Recommendations  None recommended by PT    Recommendations for Other Services       Precautions / Restrictions Precautions Precautions: None      Mobility  Bed Mobility               General bed mobility comments: in chair on arrival and end of session    Transfers Overall transfer level: Modified independent                  Ambulation/Gait Ambulation/Gait assistance: Supervision Gait Distance (Feet): 400 Feet Assistive device: None Gait Pattern/deviations: WFL(Within Functional Limits)   Gait velocity interpretation: >2.62 ft/sec, indicative of community ambulatory General Gait Details: supervision for IV management only. pt with good speed and stability able to self regulate distance and fatigue  Stairs            Wheelchair Mobility    Modified Rankin (Stroke Patients Only)       Balance Overall balance assessment: No apparent balance deficits (not  formally assessed)                                           Pertinent Vitals/Pain Pain Assessment: No/denies pain    Home Living Family/patient expects to be discharged to:: Private residence Living Arrangements: Alone Available Help at Discharge: Friend(s);Available PRN/intermittently Type of Home: Other(Comment) (long term hotel) Home Access: Level entry     Home Layout: One level Home Equipment: None      Prior Function Level of Independence: Independent               Hand Dominance        Extremity/Trunk Assessment   Upper Extremity Assessment Upper Extremity Assessment: Overall WFL for tasks assessed    Lower Extremity Assessment Lower Extremity Assessment: Overall WFL for tasks assessed    Cervical / Trunk Assessment Cervical / Trunk Assessment: Normal  Communication   Communication: No difficulties  Cognition Arousal/Alertness: Awake/alert Behavior During Therapy: WFL for tasks assessed/performed Overall Cognitive Status: Within Functional Limits for tasks assessed                                        General Comments      Exercises     Assessment/Plan    PT Assessment Patent  does not need any further PT services  PT Problem List         PT Treatment Interventions      PT Goals (Current goals can be found in the Care Plan section)  Acute Rehab PT Goals PT Goal Formulation: All assessment and education complete, DC therapy    Frequency     Barriers to discharge        Co-evaluation               AM-PAC PT "6 Clicks" Mobility  Outcome Measure Help needed turning from your back to your side while in a flat bed without using bedrails?: None Help needed moving from lying on your back to sitting on the side of a flat bed without using bedrails?: None Help needed moving to and from a bed to a chair (including a wheelchair)?: None Help needed standing up from a chair using your arms (e.g.,  wheelchair or bedside chair)?: None Help needed to walk in hospital room?: None Help needed climbing 3-5 steps with a railing? : None 6 Click Score: 24    End of Session   Activity Tolerance: Patient tolerated treatment well Patient left: in chair;with call bell/phone within reach Nurse Communication: Mobility status PT Visit Diagnosis: Other abnormalities of gait and mobility (R26.89)    Time: 0962-8366 PT Time Calculation (min) (ACUTE ONLY): 28 min   Charges:   PT Evaluation $PT Eval Moderate Complexity: 1 Mod PT Treatments $Gait Training: 8-22 mins        [place] Pager: [phone] Office: [place] 06/23/2020, 1:33 PM

## 2020-06-24 LAB — CBC
HCT: 26.9 % — ABNORMAL LOW (ref 39.0–52.0)
Hemoglobin: 8.9 g/dL — ABNORMAL LOW (ref 13.0–17.0)
MCH: 27.6 pg (ref 26.0–34.0)
MCHC: 33.1 g/dL (ref 30.0–36.0)
MCV: 83.3 fL (ref 80.0–100.0)
Platelets: 106 10*3/uL — ABNORMAL LOW (ref 150–400)
RBC: 3.23 MIL/uL — ABNORMAL LOW (ref 4.22–5.81)
RDW: 14.7 % (ref 11.5–15.5)
WBC: 7 10*3/uL (ref 4.0–10.5)
nRBC: 0 % (ref 0.0–0.2)

## 2020-06-24 LAB — COMPREHENSIVE METABOLIC PANEL
ALT: 26 U/L (ref 0–44)
AST: 36 U/L (ref 15–41)
Albumin: 2.5 g/dL — ABNORMAL LOW (ref 3.5–5.0)
Alkaline Phosphatase: 72 U/L (ref 38–126)
Anion gap: 5 (ref 5–15)
BUN: 21 mg/dL (ref 8–23)
CO2: 24 mmol/L (ref 22–32)
Calcium: 7.9 mg/dL — ABNORMAL LOW (ref 8.9–10.3)
Chloride: 102 mmol/L (ref 98–111)
Creatinine, Ser: 1.73 mg/dL — ABNORMAL HIGH (ref 0.61–1.24)
GFR, Estimated: 41 mL/min — ABNORMAL LOW (ref >60)
Glucose, Bld: 152 mg/dL — ABNORMAL HIGH (ref 70–99)
Potassium: 3.8 mmol/L (ref 3.5–5.1)
Sodium: 131 mmol/L — ABNORMAL LOW (ref 135–145)
Total Bilirubin: 1.5 mg/dL — ABNORMAL HIGH (ref 0.3–1.2)
Total Protein: 5.6 g/dL — ABNORMAL LOW (ref 6.5–8.1)

## 2020-06-24 LAB — COOXEMETRY PANEL
Carboxyhemoglobin: 1.2 % (ref 0.5–1.5)
Methemoglobin: 1 % (ref 0.0–1.5)
O2 Saturation: 60.8 %
Total hemoglobin: 9.1 g/dL — ABNORMAL LOW (ref 12.0–16.0)

## 2020-06-24 LAB — IRON AND TIBC
Iron: 22 ug/dL — ABNORMAL LOW (ref 45–182)
Saturation Ratios: 8 % — ABNORMAL LOW (ref 17.9–39.5)
TIBC: 287 ug/dL (ref 250–450)
UIBC: 265 ug/dL

## 2020-06-24 LAB — MAGNESIUM: Magnesium: 1.9 mg/dL (ref 1.7–2.4)

## 2020-06-24 LAB — RETICULOCYTES
Immature Retic Fract: 26.1 % — ABNORMAL HIGH (ref 2.3–15.9)
RBC.: 3.17 MIL/uL — ABNORMAL LOW (ref 4.22–5.81)
Retic Count, Absolute: 74.5 10*3/uL (ref 19.0–186.0)
Retic Ct Pct: 2.4 % (ref 0.4–3.1)

## 2020-06-24 LAB — GLUCOSE, CAPILLARY
Glucose-Capillary: 129 mg/dL — ABNORMAL HIGH (ref 70–99)
Glucose-Capillary: 153 mg/dL — ABNORMAL HIGH (ref 70–99)
Glucose-Capillary: 213 mg/dL — ABNORMAL HIGH (ref 70–99)

## 2020-06-24 LAB — VITAMIN B12: Vitamin B-12: 495 pg/mL (ref 180–914)

## 2020-06-24 LAB — FOLATE: Folate: 6.8 ng/mL (ref >5.9)

## 2020-06-24 LAB — HEPARIN LEVEL (UNFRACTIONATED): Heparin Unfractionated: 0.61 IU/mL (ref 0.30–0.70)

## 2020-06-24 LAB — FERRITIN: Ferritin: 42 ng/mL (ref 24–336)

## 2020-06-24 MED ORDER — SPIRONOLACTONE 12.5 MG HALF TABLET
12.5000 mg | ORAL_TABLET | Freq: Every day | ORAL | Status: DC
  Administered 2020-06-24 – 2020-06-25 (×2): 12.5 mg via ORAL
  Filled 2020-06-24 (×2): qty 1

## 2020-06-24 MED ORDER — APIXABAN 5 MG PO TABS
5.0000 mg | ORAL_TABLET | Freq: Two times a day (BID) | ORAL | Status: DC
  Administered 2020-06-24 – 2020-06-29 (×11): 5 mg via ORAL
  Filled 2020-06-24 (×12): qty 1

## 2020-06-24 NOTE — Progress Notes (Signed)
CARDIAC REHAB PHASE I   Went to offer to walk with pt. Pt states ambulation with nurse. Denies CP, SOB or dizziness. Pt denies questions or concerns regarding yesterdays education. Encouraged continued ambulation. Will continue to follow.  [id number] Rufina Falco, RN BSN 06/24/2020 2:04 PM

## 2020-06-24 NOTE — Progress Notes (Addendum)
Patient ID: Douglas Villa, male   DOB: 03/30/1950, 70 y.o.   MRN: [id number]     Advanced Heart Failure Rounding Note  PCP-Cardiologist: None   Subjective:    5/21 Left IJ CVL and IABP removed  5/23 milrinone cut back 0.375 mcg  Remains on amio + heparin drip +milrinone 0.375. CO-OX 60%.    Denies SOB. Walked around the unit.   Objective:   Weight Range: 87 kg Body mass index is 31.92 kg/m.   Vital Signs:   Temp:  [97.7 F (36.5 C)-98.7 F (37.1 C)] 98.7 F (37.1 C) (05/24 0739) Pulse Rate:  [86-120] 86 (05/24 0700) Resp:  [13-29] 22 (05/24 0700) BP: (94-145)/(68-110) 125/83 (05/24 0600) SpO2:  [93 %-100 %] 98 % (05/24 0700) Weight:  [87 kg] 87 kg (05/24 0500) Last BM Date: 06/23/20  Weight change: Filed Weights   06/22/20 0500 06/23/20 0500 06/24/20 0500  Weight: 84.1 kg 82.6 kg 87 kg    Intake/Output:   Intake/Output Summary (Last 24 hours) at 06/24/2020 0757 Last data filed at 06/24/2020 4536 Gross per 24 hour  Intake 2494.84 ml  Output 1787 ml  Net 707.84 ml      Physical Exam   CVP 5  General:  No resp difficulty HEENT: normal Neck: supple. no JVD. Carotids 2+ bilat; no bruits. No lymphadenopathy or thryomegaly appreciated. Cor: PMI nondisplaced. Irregular rate & rhythm. No rubs, gallops or murmurs. Lungs: clear Abdomen: soft, nontender, nondistended. No hepatosplenomegaly. No bruits or masses. Good bowel sounds. Extremities: no cyanosis, clubbing, rash, edema Neuro: alert & orientedx3, cranial nerves grossly intact. moves all 4 extremities w/o difficulty. Affect pleasant  Telemetry   Afib with brief NSVT 80-90s  Labs    CBC Recent Labs    06/23/20 0356 06/24/20 0332  WBC 8.1 7.0  HGB 9.5* 8.9*  HCT 29.4* 26.9*  MCV 84.2 83.3  PLT 111* 468*   Basic Metabolic Panel Recent Labs    06/23/20 0356 06/24/20 0332  NA 129* 131*  K 4.3 3.8  CL 99 102  CO2 24 24  GLUCOSE 305* 152*  BUN 28* 21  CREATININE 1.99* 1.73*  CALCIUM 7.8*  7.9*  MG  --  1.9   Liver Function Tests Recent Labs    06/23/20 0356 06/24/20 0332  AST 40 36  ALT 26 26  ALKPHOS 72 72  BILITOT 1.6* 1.5*  PROT 5.5* 5.6*  ALBUMIN 2.6* 2.5*   No results for input(s): LIPASE, AMYLASE in the last 72 hours. Cardiac Enzymes No results for input(s): CKTOTAL, CKMB, CKMBINDEX, TROPONINI in the last 72 hours.  BNP: BNP (last 3 results) No results for input(s): BNP in the last 8760 hours.  ProBNP (last 3 results) No results for input(s): PROBNP in the last 8760 hours.   D-Dimer No results for input(s): DDIMER in the last 72 hours. Hemoglobin A1C Recent Labs    06/21/20 0918  HGBA1C 7.7*   Fasting Lipid Panel No results for input(s): CHOL, HDL, LDLCALC, TRIG, CHOLHDL, LDLDIRECT in the last 72 hours. Thyroid Function Tests No results for input(s): TSH, T4TOTAL, T3FREE, THYROIDAB in the last 72 hours.  Invalid input(s): FREET3  Other results:   Imaging    No results found.   Medications:     Scheduled Medications: . aspirin  81 mg Oral Daily  . atorvastatin  80 mg Oral q1800  . Chlorhexidine Gluconate Cloth  6 each Topical Daily  . clopidogrel  75 mg Oral Daily  . digoxin  0.125  mg Oral Daily  . insulin aspart  0-15 Units Subcutaneous TID WC  . insulin aspart  0-5 Units Subcutaneous QHS  . isosorbide-hydrALAZINE  0.5 tablet Oral TID  . sodium chloride flush  10-40 mL Intracatheter Q12H  . sodium chloride flush  3 mL Intravenous Q12H    Infusions: . sodium chloride    . sodium chloride Stopped (06/21/20 1300)  . sodium chloride 20 mL/hr at 06/24/20 0700  . amiodarone 60 mg/hr (06/24/20 0700)  . heparin 900 Units/hr (06/24/20 0702)  . milrinone 0.375 mcg/kg/min (06/24/20 0723)    PRN Medications: sodium chloride, sodium chloride, acetaminophen, ondansetron (ZOFRAN) IV, sodium chloride flush, sodium chloride flush    Assessment/Plan   1. CAD: H/o DES to LCX in 3/19, had occluded PLV at that time. Admitted with  anterior STEMI and occluded mLAD, had DES to mLAD, known TO PLV with collaterals and patent LCx stent.  No chest pain.  - Continue ASA 81, Plavix.  - Continue atorvastatin 80.  2. CKD: Stage 3, creatinine seems to be around 2 at baseline.  Creatinine 2 -->1.7 today.   3. 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  Echo this admission EF 20-25%, moderately decreased RV function with mild RVE, moderate biatrial enlargement, PASP 34, mild MR, mild TR, IVC normal.    - IABP out 5/22.  - CO-OX 60%. Cut back milrinone down to 0.25 mcg.  - CVP 5-6 . Hold off on diuretics. Consider restarting tomorrow.  - Bidil 1/2 tab tid--> will need to split to hydralazine/imdur at d/c  - digoxin 0.125 daily.   4. COPD: Prior smoker.  5. Type 2 diabetes: SSI for now, hgbA1c 7.7.   6. Atrial flutter: Newly noted.  Remains in A Fib 90s  - Continue amiodarone gtt 30 mg/hr.  - Continue heparin gtt, needs DOAC eventually. -  TEE-DCCV eventually if remains in AFL. Ideally would be on lower dose of milrinone . Set up for TEE/DC-CV later this week.  - Watch platelets 101-->111-->109  7. Tricuspid regurgitation: Only mild on repeat complete echo.   8. Hyponatremia Sodium trending up 131 today. Continue to restrict free water 9. Thrombocytopenia: 106 today.  10. Anemia  Hgb 8.9 . Check anemia panel. If Iron sats low may need feraheme  He has Medicare A& B.  At discharge will need to split bidil to hydralazine/imdur. Will look at [facility] coverage and can start patient assistance for eliquis.   Length of Stay: 4  Amy Clegg, NP  06/24/2020, 7:57 AM  Advanced Heart Failure Team Pager [phone] (M-F; 7a - 5p)  Please contact [facility] for night-coverage after hours (5p -7a ) and weekends on [email or web address]  Patient seen with NP, agree with the above note.   Still has some dyspnea but CVP down to 5-6.  Co-ox 61% on  milrinone 0.375.  Creatinine down to 1.73.  Remains in rate-controlled atrial fibrillation .  General: NAD Neck: No JVD, no thyromegaly or thyroid nodule.  Lungs: Mild crackles at bases. CV: Nondisplaced PMI.  Heart irregular S1/S2, no S3/S4, no murmur.  Trace ankle edema.   Abdomen: Soft, nontender, no hepatosplenomegaly, no distention.  Skin: Intact without lesions or rashes.  Neurologic: Alert and oriented x 3.  Psych: Normal affect. Extremities: No clubbing or cyanosis.  HEENT: Normal.   Will hold diuretic today, start again likely tomorrow.    Decrease milrinone to 0.25.  Continue digoxin and Bidil, add spironolactone 12.5 daily.   Stop heparin gtt, start apixaban 5 mg bid.   Remains in AF, plan for TEE-guided DCCV when milrinone weaned down, aim for Thursday.   Loralie Champagne 06/24/2020 8:41 AM

## 2020-06-24 NOTE — Plan of Care (Signed)

## 2020-06-24 NOTE — Progress Notes (Addendum)
ANTICOAGULATION CONSULT NOTE - Follow Up Consult  Pharmacy Consult for IV heparin Indication: afib  Allergies  Allergen Reactions  . Penicillins     Sweating     Patient Measurements: Height: 5\' 5"  (165.1 cm) Weight: 87 kg (191 lb 12.8 oz) IBW/kg (Calculated) : 61.5 Heparin Dosing Weight: ~ 80 kg  Vital Signs: Temp: 98.2 F (36.8 C) (05/23 2300) Temp Source: Oral (05/23 2300) BP: 125/83 (05/24 0600) Pulse Rate: 88 (05/24 0600)  Labs: Recent Labs    06/22/20 0505 06/22/20 0938 06/22/20 1710 06/23/20 0356 06/23/20 0927 06/24/20 0332  HGB 9.2* 9.3*  --  9.5*  --  8.9*  HCT 28.4* 29.0*  --  29.4*  --  26.9*  PLT PLATELET CLUMPS NOTED ON SMEAR, COUNT APPEARS DECREASED 101*  --  111*  --  106*  HEPARINUNFRC  --   --    < > 0.28* 0.33 0.61  CREATININE 1.89*  --   --  1.99*  --  1.73*   < > = values in this interval not displayed.    Estimated Creatinine Clearance: 39.1 mL/min (A) (by C-G formula based on SCr of 1.73 mg/dL (H)).   Assessment: 70 yo male admitted for STEMI s/p cath with PCI and insertion of IABP. Patient is not on anticoagulation prior to admission.  He is now s/p IABP removal 5/21;   Heparin level now therapeutic (0.61) on gtt at 1000 units/hr CBC Hgb 8.9, plt 106 (low stable)  Goal of Therapy:  Heparin level 0.3-0.5 units/ml Monitor platelets by anticoagulation protocol: Yes   Plan:  Decrease heparin to 900 units/hr F/u 8 hr HL at 1400 F/u daily HL, CBC, s/sx bleeding F/u transition to oral agents as appropriate   Wilson Singer, PharmD PGY1 Pharmacy Resident 06/24/2020 6:50 AM  ----------------------------- Pharmacy now consulted to transition from heparin gtt > apixaban for treatment of afib, Age 70 yo, Scr 1.73, weight 87kg. - does not qualify for dose reduction at this time.  Plan Start apixaban 5mg  BID  Stop heparin infusion and give first dose apixaban at that time F/u CBC, s/sx bleeding

## 2020-06-25 DIAGNOSIS — I4819 Other persistent atrial fibrillation: Secondary | ICD-10-CM

## 2020-06-25 LAB — COMPREHENSIVE METABOLIC PANEL
ALT: 28 U/L (ref 0–44)
AST: 33 U/L (ref 15–41)
Albumin: 2.5 g/dL — ABNORMAL LOW (ref 3.5–5.0)
Alkaline Phosphatase: 73 U/L (ref 38–126)
Anion gap: 6 (ref 5–15)
BUN: 17 mg/dL (ref 8–23)
CO2: 22 mmol/L (ref 22–32)
Calcium: 8.1 mg/dL — ABNORMAL LOW (ref 8.9–10.3)
Chloride: 104 mmol/L (ref 98–111)
Creatinine, Ser: 1.55 mg/dL — ABNORMAL HIGH (ref 0.61–1.24)
GFR, Estimated: 47 mL/min — ABNORMAL LOW (ref >60)
Glucose, Bld: 145 mg/dL — ABNORMAL HIGH (ref 70–99)
Potassium: 3.8 mmol/L (ref 3.5–5.1)
Sodium: 132 mmol/L — ABNORMAL LOW (ref 135–145)
Total Bilirubin: 1.8 mg/dL — ABNORMAL HIGH (ref 0.3–1.2)
Total Protein: 5.4 g/dL — ABNORMAL LOW (ref 6.5–8.1)

## 2020-06-25 LAB — COOXEMETRY PANEL
Carboxyhemoglobin: 1.2 % (ref 0.5–1.5)
Methemoglobin: 0.9 % (ref 0.0–1.5)
O2 Saturation: 62.8 %
Total hemoglobin: 14.1 g/dL (ref 12.0–16.0)

## 2020-06-25 LAB — GLUCOSE, CAPILLARY
Glucose-Capillary: 84 mg/dL (ref 70–99)
Glucose-Capillary: 107 mg/dL — ABNORMAL HIGH (ref 70–99)
Glucose-Capillary: 186 mg/dL — ABNORMAL HIGH (ref 70–99)
Glucose-Capillary: 152 mg/dL — ABNORMAL HIGH (ref 70–99)
Glucose-Capillary: 187 mg/dL — ABNORMAL HIGH (ref 70–99)

## 2020-06-25 LAB — CBC
HCT: 27.7 % — ABNORMAL LOW (ref 39.0–52.0)
Hemoglobin: 9 g/dL — ABNORMAL LOW (ref 13.0–17.0)
MCH: 27.3 pg (ref 26.0–34.0)
MCHC: 32.5 g/dL (ref 30.0–36.0)
MCV: 83.9 fL (ref 80.0–100.0)
Platelets: 121 10*3/uL — ABNORMAL LOW (ref 150–400)
RBC: 3.3 MIL/uL — ABNORMAL LOW (ref 4.22–5.81)
RDW: 14.7 % (ref 11.5–15.5)
WBC: 5.9 10*3/uL (ref 4.0–10.5)
nRBC: 0 % (ref 0.0–0.2)

## 2020-06-25 MED ORDER — DAPAGLIFLOZIN PROPANEDIOL 10 MG PO TABS
10.0000 mg | ORAL_TABLET | Freq: Every day | ORAL | Status: DC
  Administered 2020-06-26 – 2020-06-29 (×4): 10 mg via ORAL
  Filled 2020-06-25 (×6): qty 1

## 2020-06-25 MED ORDER — POTASSIUM CHLORIDE CRYS ER 20 MEQ PO TBCR
20.0000 meq | EXTENDED_RELEASE_TABLET | Freq: Once | ORAL | Status: AC
  Administered 2020-06-25: 20 meq via ORAL
  Filled 2020-06-25: qty 1

## 2020-06-25 MED ORDER — SPIRONOLACTONE 25 MG PO TABS
25.0000 mg | ORAL_TABLET | Freq: Every day | ORAL | Status: DC
  Administered 2020-06-26 – 2020-06-29 (×4): 25 mg via ORAL
  Filled 2020-06-25 (×4): qty 1

## 2020-06-25 MED ORDER — ISOSORBIDE MONONITRATE ER 30 MG PO TB24
30.0000 mg | ORAL_TABLET | Freq: Every day | ORAL | Status: DC
  Administered 2020-06-25 – 2020-06-29 (×5): 30 mg via ORAL
  Filled 2020-06-25 (×5): qty 1

## 2020-06-25 MED ORDER — HYDRALAZINE HCL 25 MG PO TABS
25.0000 mg | ORAL_TABLET | Freq: Three times a day (TID) | ORAL | Status: DC
  Administered 2020-06-25 – 2020-06-26 (×4): 25 mg via ORAL
  Filled 2020-06-25 (×4): qty 1

## 2020-06-25 MED ORDER — SODIUM CHLORIDE 0.9 % IV SOLN
510.0000 mg | INTRAVENOUS | Status: DC
  Administered 2020-06-25: 510 mg via INTRAVENOUS
  Filled 2020-06-25: qty 17

## 2020-06-25 NOTE — Progress Notes (Signed)
Home meds sent to pharmacy; patient aware and to receive back on discharge from hospital. Paperwork located in patient's chart.

## 2020-06-25 NOTE — Progress Notes (Signed)
Transitions of Care Pharmacist Note  Douglas Villa is a 70 y.o. male that has been diagnosed with A Fib and will be prescribed Eliquis (apixaban) at discharge. Patient assistance application for Eliquis was filled out and given to HF clinic for fax to [facility].   Patient Education: I provided the following education on 06/25/2020 to the patient: How to take the medication Described what the medication is Signs of bleeding Signs/symptoms of VTE and stroke  Answered their questions  Discharge Medications Plan: The patient wants to have their discharge medications filled by the Transitions of Care pharmacy rather than their usual pharmacy.  The discharge orders pharmacy has been changed to the Transitions of Care pharmacy, the patient will receive a phone call regarding co-pay, and their medications will be delivered by the Transitions of Care pharmacy.    Thank you,   Wilson Singer, PharmD PGY1 Pharmacy Resident 06/25/2020 12:22 PM  Jun 25, 2020

## 2020-06-25 NOTE — Progress Notes (Addendum)
Patient ID: Douglas Villa, male   DOB: [birth year], 70 y.o.   MRN: [id number]     Advanced Heart Failure Rounding Note  PCP-Cardiologist: None   Subjective:    5/21 Left IJ CVL and IABP removed  5/23 milrinone cut back 0.375 mcg 5/24 milrinone cut back to 0.25 mcg.   Remains on amio + heparin drip +milrinone 0.25. CO-OX 63%.    Creatinine trending down 1.9>1.7>1.5   Feels good. No complaints.   Objective:   Weight Range: 84.5 kg Body mass index is 31 kg/m.   Vital Signs:   Temp:  [98.3 F (36.8 C)-98.8 F (37.1 C)] 98.3 F (36.8 C) (05/24 2354) Pulse Rate:  [75-106] 92 (05/25 0700) Resp:  [15-31] 23 (05/25 0700) BP: (117-146)/(74-100) 132/81 (05/25 0600) SpO2:  [87 %-100 %] 87 % (05/25 0700) Weight:  [84.5 kg] 84.5 kg (05/25 0500) Last BM Date: 06/23/20  Weight change: Filed Weights   06/23/20 0500 06/24/20 0500 06/25/20 0500  Weight: 82.6 kg 87 kg 84.5 kg    Intake/Output:   Intake/Output Summary (Last 24 hours) at 06/25/2020 0827 Last data filed at 06/25/2020 0700 Gross per 24 hour  Intake 1974.87 ml  Output 1750 ml  Net 224.87 ml      Physical Exam   CVP 7  General:  Sitting on the side of the bed.  No resp difficulty HEENT: normal Neck: supple. no JVD. Carotids 2+ bilat; no bruits. No lymphadenopathy or thryomegaly appreciated. Cor: PMI nondisplaced. Irregular rate & rhythm. No rubs, gallops or murmurs. Lungs: clear Abdomen: soft, nontender, nondistended. No hepatosplenomegaly. No bruits or masses. Good bowel sounds. Extremities: no cyanosis, clubbing, rash, edema Neuro: alert & orientedx3, cranial nerves grossly intact. moves all 4 extremities w/o difficulty. Affect pleasant   Telemetry   A Fib 90-100s  Labs    CBC Recent Labs    06/24/20 0332 06/25/20 0408  WBC 7.0 5.9  HGB 8.9* 9.0*  HCT 26.9* 27.7*  MCV 83.3 83.9  PLT 106* 341*   Basic Metabolic Panel Recent Labs    06/24/20 0332 06/25/20 0408  NA 131* 132*  K 3.8 3.8  CL  102 104  CO2 24 22  GLUCOSE 152* 145*  BUN 21 17  CREATININE 1.73* 1.55*  CALCIUM 7.9* 8.1*  MG 1.9  --    Liver Function Tests Recent Labs    06/24/20 0332 06/25/20 0408  AST 36 33  ALT 26 28  ALKPHOS 72 73  BILITOT 1.5* 1.8*  PROT 5.6* 5.4*  ALBUMIN 2.5* 2.5*   No results for input(s): LIPASE, AMYLASE in the last 72 hours. Cardiac Enzymes No results for input(s): CKTOTAL, CKMB, CKMBINDEX, TROPONINI in the last 72 hours.  BNP: BNP (last 3 results) No results for input(s): BNP in the last 8760 hours.  ProBNP (last 3 results) No results for input(s): PROBNP in the last 8760 hours.   D-Dimer No results for input(s): DDIMER in the last 72 hours. Hemoglobin A1C No results for input(s): HGBA1C in the last 72 hours. Fasting Lipid Panel No results for input(s): CHOL, HDL, LDLCALC, TRIG, CHOLHDL, LDLDIRECT in the last 72 hours. Thyroid Function Tests No results for input(s): TSH, T4TOTAL, T3FREE, THYROIDAB in the last 72 hours.  Invalid input(s): FREET3  Other results:   Imaging    No results found.   Medications:     Scheduled Medications: . apixaban  5 mg Oral BID  . aspirin  81 mg Oral Daily  . atorvastatin  80 mg  Oral q1800  . Chlorhexidine Gluconate Cloth  6 each Topical Daily  . clopidogrel  75 mg Oral Daily  . digoxin  0.125 mg Oral Daily  . insulin aspart  0-15 Units Subcutaneous TID WC  . insulin aspart  0-5 Units Subcutaneous QHS  . isosorbide-hydrALAZINE  0.5 tablet Oral TID  . potassium chloride  20 mEq Oral Once  . sodium chloride flush  10-40 mL Intracatheter Q12H  . sodium chloride flush  3 mL Intravenous Q12H  . spironolactone  12.5 mg Oral Daily    Infusions: . sodium chloride    . sodium chloride Stopped (06/21/20 1300)  . sodium chloride 20 mL/hr at 06/25/20 0700  . amiodarone 30 mg/hr (06/25/20 0700)  . milrinone 0.25 mcg/kg/min (06/25/20 0700)    PRN Medications: sodium chloride, sodium chloride, acetaminophen, ondansetron  (ZOFRAN) IV, sodium chloride flush, sodium chloride flush    Assessment/Plan   1. CAD: H/o DES to LCX in 3/19, had occluded PLV at that time. Admitted with anterior STEMI and occluded mLAD, had DES to mLAD, known TO PLV with collaterals and patent LCx stent.  - No chest pain.   - Continue ASA 81, Plavix. Will drop aspirin when he goes home as he is also on apixaban.  - Continue atorvastatin 80.  2. CKD: Stage 3, creatinine seems to be around 2 at baseline.  Creatinine 2 -->1.7 today.   3. 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  Echo this admission EF 20-25%, moderately decreased RV function with mild RVE, moderate biatrial enlargement, PASP 34, mild MR, mild TR, IVC normal.    - IABP out 5/22.  - CO-OX 63%.  Cut back milrinone 0.125 mcg.  - CVP 5-6 . Hold off on diuretics. Consider restarting tomorrow.  - Stop bidil. Switch to hydralazine 25 mg three times a day + imdur 30 mg daily.  - digoxin 0.125 daily, check dig level.    4. COPD: Prior smoker.  5. Type 2 diabetes: SSI for now, hgbA1c 7.7.   6. Atrial flutter: Newly noted.   - in A fib today 90s  - Continue amiodarone gtt 30 mg/hr.  - Continue apixaban.  - Cardioversion later this week.   - Watch platelets 101-->111-->109 ->121 7. Tricuspid regurgitation: Only mild on repeat complete echo.   8. Hyponatremia Sodium trending up 132 today. Continue to restrict free water 9. Thrombocytopenia: 106>121 .  10. Anemia  Hgb 9 . Iron sats low. Give feraheme today.   He has Medicare A& B.  At discharge will need to split bidil to hydralazine/imdur. Will look at [facility] coverage and can start patient assistance for eliquis.   Transfer to 2 C. Set up TEE/DC/CV later this week.  Walking over 1000 feet.   Length of Stay: Martin, NP  06/25/2020, 8:27 AM  Advanced Heart Failure Team Pager [phone] (M-F; 7a - 5p)  Please  contact [facility] for night-coverage after hours (5p -7a ) and weekends on [email or web address]  Patient seen with NP, agree with the above note.   He remains in atrial fibrillation on heparin gtt and apixaban.  HR 90s.   CVP 5 today, co-ox 63% on milrinone 0.25.    No complaints, has walked.   General: NAD Neck: No JVD, no thyromegaly or thyroid nodule.  Lungs: Clear to auscultation bilaterally with normal respiratory effort. CV: Nondisplaced PMI.  Heart irregular S1/S2, no  S3/S4, no murmur.  No peripheral edema.   Abdomen: Soft, nontender, no hepatosplenomegaly, no distention.  Skin: Intact without lesions or rashes.  Neurologic: Alert and oriented x 3.  Psych: Normal affect. Extremities: No clubbing or cyanosis.  HEENT: Normal.   Decrease milrinone to 0.125, hopefully stop tomorrow.  Continue hydralazine/Imdur, digoxin.  Can increase spironolactone to 25 mg daily and add dapagliflozin 10 mg daily.  No diuretic today with CVP 5, possibly start torsemide 20 tomorrow.   Creatinine down to 1.55, follow closely.   He remains in atrial fibrillation on amiodarone gtt and apixaban.  Plan TEE-guided DCCV after he is off milrinone, ?Friday.   Loralie Champagne 06/25/2020 9:19 AM

## 2020-06-25 NOTE — TOC Initial Note (Signed)
Transition of Care (TOC) - Initial/Assessment Note  Heart Failure  Patient Details  Name: Douglas Villa MRN: [id number] Date of Birth: March 06, 1950  Transition of Care [facility] CM/SW Contact:    Magazine, Manele Phone Number: 06/25/2020, 12:31 PM  Clinical Narrative:     CSW spoke with the patient at bedside and completed a very brief SDOH screening with the patient who reported that a Food Stamp referral would be helpful for him. CSW obtained the patients signature for the Food Stamp referral and disability referral  for the [facility] to reach out to them. CSW faxed over the Food Stamp application and informed the patient that the [facility] will be in touch. Patient reported they do have a PCP and they can get to the pharmacy to pick up their medications. CSW provided the patient with the social workers name, number and position and to reach out to CSW as other social needs arise.       CSW will continue to follow throughout discharge.   Barriers to Discharge: Continued Medical Work up   Patient Goals and CMS Choice        Expected Discharge Plan and Services   In-house Referral: Clinical Social Work     Living arrangements for the past 2 months: Single Family Home                                      Prior Living Arrangements/Services Living arrangements for the past 2 months: Single Family Home Lives with:: Self Patient language and need for interpreter reviewed:: Yes        Need for Family Participation in Patient Care: No (Comment) Care giver support system in place?: No (comment)   Criminal Activity/Legal Involvement Pertinent to Current Situation/Hospitalization: No - Comment as needed  Activities of Daily Living Home Assistive Devices/Equipment: None ADL Screening (condition at time of admission) Patient's cognitive ability adequate to safely complete daily activities?: Yes Is the patient deaf or have difficulty hearing?: No Does the patient  have difficulty seeing, even when wearing glasses/contacts?: Yes Does the patient have difficulty concentrating, remembering, or making decisions?: Yes Patient able to express need for assistance with ADLs?: No Does the patient have difficulty dressing or bathing?: No Independently performs ADLs?: Yes (appropriate for developmental age) Does the patient have difficulty walking or climbing stairs?: No Weakness of Legs: None Weakness of Arms/Hands: None  Permission Sought/Granted                  Emotional Assessment Appearance:: Appears stated age Attitude/Demeanor/Rapport: Engaged Affect (typically observed): Pleasant Orientation: : Oriented to Self,Oriented to Place,Oriented to  Time,Oriented to Situation   Psych Involvement: No (comment)  Admission diagnosis:  STEMI involving left anterior descending coronary artery ([place]) [I21.02] Patient Active Problem List   Diagnosis Date Noted  . STEMI involving left anterior descending coronary artery ([place]) 06/20/2020  . Acute ST elevation myocardial infarction (STEMI) due to occlusion of distal portion of left anterior descending (LAD) coronary artery ([place])   . Cardiogenic shock ([place])   . Type 2 diabetes mellitus with diabetic nephropathy ([place]) 06/18/2020  . Healthcare maintenance 09/16/2017  . 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  . Obstructive sleep apnea 08/05/2017  . Hyperlipidemia 06/03/2017  . Acute on chronic systolic heart failure ([place]) 06/03/2017  . Chronic kidney disease, stage 3 ([place]) 06/03/2017  . 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[place]) 06/03/2017  . Essential  hypertension 06/03/2017  . Mixed dyslipidemia 06/03/2017  . Atherosclerotic heart disease of native coronary artery with unstable angina pectoris ([place]) 06/03/2017  . Ex-smoker 06/03/2017   PCP:  Lillard Anes, MD Pharmacy:   [facility] [address], [place] [address] [place] [address] Phone: [phone] Fax:  [place], [place] - [address] [place] [address] Phone: [phone] Fax: [place] [address] [place] [address] Phone: [phone] Fax: [phone]     Social Determinants of Health (SDOH) Interventions Food Insecurity Interventions: Assist with SNAP Application Housing Interventions: Intervention Not Indicated  Readmission Risk Interventions No flowsheet data found.  Humzah Harty, MSW, [place] Heart Failure Social Worker

## 2020-06-26 DIAGNOSIS — I5043 Acute on chronic combined systolic (congestive) and diastolic (congestive) heart failure: Secondary | ICD-10-CM

## 2020-06-26 LAB — CBC
HCT: 30.4 % — ABNORMAL LOW (ref 39.0–52.0)
Hemoglobin: 9.8 g/dL — ABNORMAL LOW (ref 13.0–17.0)
MCH: 26.8 pg (ref 26.0–34.0)
MCHC: 32.2 g/dL (ref 30.0–36.0)
MCV: 83.1 fL (ref 80.0–100.0)
Platelets: 142 10*3/uL — ABNORMAL LOW (ref 150–400)
RBC: 3.66 MIL/uL — ABNORMAL LOW (ref 4.22–5.81)
RDW: 14.8 % (ref 11.5–15.5)
WBC: 6.9 10*3/uL (ref 4.0–10.5)
nRBC: 0 % (ref 0.0–0.2)

## 2020-06-26 LAB — COMPREHENSIVE METABOLIC PANEL
ALT: 32 U/L (ref 0–44)
AST: 38 U/L (ref 15–41)
Albumin: 2.7 g/dL — ABNORMAL LOW (ref 3.5–5.0)
Alkaline Phosphatase: 82 U/L (ref 38–126)
Anion gap: 8 (ref 5–15)
BUN: 14 mg/dL (ref 8–23)
CO2: 21 mmol/L — ABNORMAL LOW (ref 22–32)
Calcium: 8.4 mg/dL — ABNORMAL LOW (ref 8.9–10.3)
Chloride: 103 mmol/L (ref 98–111)
Creatinine, Ser: 1.52 mg/dL — ABNORMAL HIGH (ref 0.61–1.24)
GFR, Estimated: 48 mL/min — ABNORMAL LOW (ref >60)
Glucose, Bld: 110 mg/dL — ABNORMAL HIGH (ref 70–99)
Potassium: 4.1 mmol/L (ref 3.5–5.1)
Sodium: 132 mmol/L — ABNORMAL LOW (ref 135–145)
Total Bilirubin: 1.9 mg/dL — ABNORMAL HIGH (ref 0.3–1.2)
Total Protein: 6 g/dL — ABNORMAL LOW (ref 6.5–8.1)

## 2020-06-26 LAB — COOXEMETRY PANEL
Carboxyhemoglobin: 1.2 % (ref 0.5–1.5)
Carboxyhemoglobin: 1.2 % (ref 0.5–1.5)
Methemoglobin: 1.2 % (ref 0.0–1.5)
Methemoglobin: 1.1 % (ref 0.0–1.5)
O2 Saturation: 51.5 %
O2 Saturation: 53.6 %
Total hemoglobin: 10.1 g/dL — ABNORMAL LOW (ref 12.0–16.0)
Total hemoglobin: 10.5 g/dL — ABNORMAL LOW (ref 12.0–16.0)

## 2020-06-26 LAB — GLUCOSE, CAPILLARY
Glucose-Capillary: 116 mg/dL — ABNORMAL HIGH (ref 70–99)
Glucose-Capillary: 170 mg/dL — ABNORMAL HIGH (ref 70–99)
Glucose-Capillary: 132 mg/dL — ABNORMAL HIGH (ref 70–99)
Glucose-Capillary: 104 mg/dL — ABNORMAL HIGH (ref 70–99)

## 2020-06-26 LAB — DIGOXIN LEVEL: Digoxin Level: 0.5 ng/mL — ABNORMAL LOW (ref 0.8–2.0)

## 2020-06-26 MED ORDER — FUROSEMIDE 10 MG/ML IJ SOLN
80.0000 mg | Freq: Two times a day (BID) | INTRAMUSCULAR | Status: AC
  Administered 2020-06-26 (×2): 80 mg via INTRAVENOUS
  Filled 2020-06-26 (×2): qty 8

## 2020-06-26 MED ORDER — HYDRALAZINE HCL 25 MG PO TABS
37.5000 mg | ORAL_TABLET | Freq: Three times a day (TID) | ORAL | Status: DC
  Administered 2020-06-26: 37.5 mg via ORAL
  Filled 2020-06-26: qty 2

## 2020-06-26 MED ORDER — INSULIN ASPART 100 UNIT/ML IJ SOLN
0.0000 [IU] | Freq: Three times a day (TID) | INTRAMUSCULAR | Status: DC
  Administered 2020-06-26: 2 [IU] via SUBCUTANEOUS
  Administered 2020-06-26 – 2020-06-27 (×2): 3 [IU] via SUBCUTANEOUS
  Administered 2020-06-28: 2 [IU] via SUBCUTANEOUS
  Administered 2020-06-28: 3 [IU] via SUBCUTANEOUS
  Administered 2020-06-29: 5 [IU] via SUBCUTANEOUS

## 2020-06-26 MED ORDER — POTASSIUM CHLORIDE CRYS ER 20 MEQ PO TBCR
20.0000 meq | EXTENDED_RELEASE_TABLET | Freq: Once | ORAL | Status: AC
  Administered 2020-06-26: 20 meq via ORAL
  Filled 2020-06-26: qty 1

## 2020-06-26 NOTE — Progress Notes (Signed)
Patient ID: Douglas Villa, male   DOB: Jul 08, 1950, 70 y.o.   MRN: [id number]     Advanced Heart Failure Rounding Note  PCP-Cardiologist: None   Subjective:    5/21 Left IJ CVL and IABP removed  5/23 milrinone cut back 0.375 mcg 5/24 milrinone cut back to 0.25 mcg.  5/25 milrinone decreased to 0.125  He is on milrinone 0.125 today with co-ox lower at 52%.  CVP 12, did not get diuretics yesterday.   Creatinine trending down 1.9>1.7>1.52  He remains in what looks like atypical atrial flutter with controlled rate.  He is on amiodarone gtt + apixaban.   He had some dyspnea overnight.   Objective:   Weight Range: 84.1 kg Body mass index is 30.87 kg/m.   Vital Signs:   Temp:  [98.1 F (36.7 C)-98.6 F (37 C)] 98.5 F (36.9 C) (05/26 0801) Pulse Rate:  [82-96] 91 (05/26 0951) Resp:  [18-25] 20 (05/26 0801) BP: (119-145)/(69-89) 131/87 (05/26 0801) SpO2:  [98 %-100 %] 100 % (05/26 0801) Weight:  [84.1 kg] 84.1 kg (05/26 0324) Last BM Date: 06/25/20  Weight change: Filed Weights   06/24/20 0500 06/25/20 0500 06/26/20 0324  Weight: 87 kg 84.5 kg 84.1 kg    Intake/Output:   Intake/Output Summary (Last 24 hours) at 06/26/2020 1022 Last data filed at 06/26/2020 0803 Gross per 24 hour  Intake 950.55 ml  Output 1150 ml  Net -199.45 ml      Physical Exam   CVP 12 General: NAD Neck: JVP 12, no thyromegaly or thyroid nodule.  Lungs: Clear to auscultation bilaterally with normal respiratory effort. CV: Nondisplaced PMI.  Heart irregular S1/S2, no S3/S4, no murmur.  No peripheral edema.   Abdomen: Soft, nontender, no hepatosplenomegaly, no distention.  Skin: Intact without lesions or rashes.  Neurologic: Alert and oriented x 3.  Psych: Normal affect. Extremities: No clubbing or cyanosis.  HEENT: Normal.   Telemetry   Atrial flutter rate 80s (personally reviewed).   Labs    CBC Recent Labs    06/25/20 0408 06/26/20 0454  WBC 5.9 6.9  HGB 9.0* 9.8*  HCT  27.7* 30.4*  MCV 83.9 83.1  PLT 121* 709*   Basic Metabolic Panel Recent Labs    06/24/20 0332 06/25/20 0408 06/26/20 0454  NA 131* 132* 132*  K 3.8 3.8 4.1  CL 102 104 103  CO2 24 22 21*  GLUCOSE 152* 145* 110*  BUN 21 17 14   CREATININE 1.73* 1.55* 1.52*  CALCIUM 7.9* 8.1* 8.4*  MG 1.9  --   --    Liver Function Tests Recent Labs    06/25/20 0408 06/26/20 0454  AST 33 38  ALT 28 32  ALKPHOS 73 82  BILITOT 1.8* 1.9*  PROT 5.4* 6.0*  ALBUMIN 2.5* 2.7*   No results for input(s): LIPASE, AMYLASE in the last 72 hours. Cardiac Enzymes No results for input(s): CKTOTAL, CKMB, CKMBINDEX, TROPONINI in the last 72 hours.  BNP: BNP (last 3 results) No results for input(s): BNP in the last 8760 hours.  ProBNP (last 3 results) No results for input(s): PROBNP in the last 8760 hours.   D-Dimer No results for input(s): DDIMER in the last 72 hours. Hemoglobin A1C No results for input(s): HGBA1C in the last 72 hours. Fasting Lipid Panel No results for input(s): CHOL, HDL, LDLCALC, TRIG, CHOLHDL, LDLDIRECT in the last 72 hours. Thyroid Function Tests No results for input(s): TSH, T4TOTAL, T3FREE, THYROIDAB in the last 72 hours.  Invalid input(s):  FREET3  Other results:   Imaging    No results found.   Medications:     Scheduled Medications: . apixaban  5 mg Oral BID  . aspirin  81 mg Oral Daily  . atorvastatin  80 mg Oral q1800  . Chlorhexidine Gluconate Cloth  6 each Topical Daily  . clopidogrel  75 mg Oral Daily  . dapagliflozin propanediol  10 mg Oral Daily  . digoxin  0.125 mg Oral Daily  . furosemide  80 mg Intravenous BID  . hydrALAZINE  37.5 mg Oral Q8H  . insulin aspart  0-15 Units Subcutaneous TID WC  . insulin aspart  0-5 Units Subcutaneous QHS  . isosorbide mononitrate  30 mg Oral Daily  . potassium chloride  20 mEq Oral Once  . sodium chloride flush  10-40 mL Intracatheter Q12H  . sodium chloride flush  3 mL Intravenous Q12H  .  spironolactone  25 mg Oral Daily    Infusions: . sodium chloride    . sodium chloride Stopped (06/21/20 1300)  . sodium chloride 10 mL/hr at 06/26/20 0330  . amiodarone 30 mg/hr (06/26/20 0330)  . ferumoxytol Stopped (06/25/20 1010)  . milrinone 0.125 mcg/kg/min (06/26/20 0330)    PRN Medications: sodium chloride, sodium chloride, acetaminophen, ondansetron (ZOFRAN) IV, sodium chloride flush, sodium chloride flush    Assessment/Plan   1. CAD: H/o DES to LCX in 3/19, had occluded PLV at that time. Admitted with anterior STEMI and occluded mLAD, had DES to mLAD, known CTO PLV with collaterals and patent LCx stent.  No chest pain.  - Continue ASA 81, Plavix. Will drop aspirin when he goes home as he is also on apixaban.  - Continue atorvastatin 80.  2. CKD: Stage 3, creatinine seems to be around 2 at baseline.  Creatinine 2 -->1.52 today.   3. 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  Echo this admission EF 20-25%, moderately decreased RV function with mild RVE, moderate biatrial enlargement, PASP 34, mild MR, mild TR, IVC normal.   IABP out 5/22.  He is on milrinone 0.125 with co-ox down to 52% today.  CVP 12, no diuretic yesterday.  - Lasix 80 mg IV bid x 2 doses today, hopefully to po diuretic tomorrow.  - Continue milrinone today while on IV diuretic, hopefully stop tomorrow.  - Increase hydralazine to 37.5 mg tid and continue Imdur 30 mg daily.  - digoxin 0.125 daily, continue.  - spironolactone 25 mg daily.  - dapagliflozin 10 mg daily.    4. COPD: Prior smoker.  5. Type 2 diabetes: SSI for now, hgbA1c 7.7.   6. Atrial flutter: Atypical, newly noted this admission.   - Continue amiodarone gtt 30 mg/hr.  - Continue apixaban.  - TEE-DCCV tomorrow (stop milrinone prior).  7. Tricuspid regurgitation: Only mild on repeat complete echo.   8. Hyponatremia: Sodium trending up  132 today. Continue to restrict free water 9. Thrombocytopenia: 324>401>027.  10. Anemia: Hgb 9.8.  Has had feraheme.   Loralie Champagne 06/26/2020 10:22 AM

## 2020-06-26 NOTE — Care Management Important Message (Signed)
Important Message  Patient Details  Name: Douglas Villa MRN: [id number] Date of Birth: 1950/05/20   Medicare Important Message Given:  Yes     Shelda Altes 06/26/2020, 9:42 AM

## 2020-06-27 ENCOUNTER — Inpatient Hospital Stay (HOSPITAL_COMMUNITY): Payer: Medicare Other | Admitting: Anesthesiology

## 2020-06-27 ENCOUNTER — Inpatient Hospital Stay (HOSPITAL_COMMUNITY): Payer: Medicare Other

## 2020-06-27 ENCOUNTER — Other Ambulatory Visit (HOSPITAL_COMMUNITY): Payer: Self-pay

## 2020-06-27 ENCOUNTER — Encounter (HOSPITAL_COMMUNITY): Payer: Self-pay | Admitting: Cardiovascular Disease

## 2020-06-27 ENCOUNTER — Encounter (HOSPITAL_COMMUNITY)
Admission: AC | Disposition: A | Discharge status: Home / Self Care | Payer: Self-pay | Source: Other Acute Inpatient Hospital | Attending: Cardiovascular Disease

## 2020-06-27 DIAGNOSIS — I4892 Unspecified atrial flutter: Secondary | ICD-10-CM

## 2020-06-27 DIAGNOSIS — I340 Nonrheumatic mitral (valve) insufficiency: Secondary | ICD-10-CM

## 2020-06-27 HISTORY — PX: CARDIOVERSION: SHX1299

## 2020-06-27 HISTORY — PX: TEE WITHOUT CARDIOVERSION: SHX5443

## 2020-06-27 LAB — COMPREHENSIVE METABOLIC PANEL
ALT: 42 U/L (ref 0–44)
AST: 49 U/L — ABNORMAL HIGH (ref 15–41)
Albumin: 2.9 g/dL — ABNORMAL LOW (ref 3.5–5.0)
Alkaline Phosphatase: 84 U/L (ref 38–126)
Anion gap: 9 (ref 5–15)
BUN: 16 mg/dL (ref 8–23)
CO2: 25 mmol/L (ref 22–32)
Calcium: 8.8 mg/dL — ABNORMAL LOW (ref 8.9–10.3)
Chloride: 100 mmol/L (ref 98–111)
Creatinine, Ser: 1.96 mg/dL — ABNORMAL HIGH (ref 0.61–1.24)
GFR, Estimated: 36 mL/min — ABNORMAL LOW (ref >60)
Glucose, Bld: 103 mg/dL — ABNORMAL HIGH (ref 70–99)
Potassium: 3.9 mmol/L (ref 3.5–5.1)
Sodium: 134 mmol/L — ABNORMAL LOW (ref 135–145)
Total Bilirubin: 2.1 mg/dL — ABNORMAL HIGH (ref 0.3–1.2)
Total Protein: 6.4 g/dL — ABNORMAL LOW (ref 6.5–8.1)

## 2020-06-27 LAB — GLUCOSE, CAPILLARY
Glucose-Capillary: 93 mg/dL (ref 70–99)
Glucose-Capillary: 106 mg/dL — ABNORMAL HIGH (ref 70–99)
Glucose-Capillary: 170 mg/dL — ABNORMAL HIGH (ref 70–99)
Glucose-Capillary: 164 mg/dL — ABNORMAL HIGH (ref 70–99)

## 2020-06-27 LAB — PROTIME-INR
INR: 1.4 — ABNORMAL HIGH (ref 0.8–1.2)
Prothrombin Time: 17.6 seconds — ABNORMAL HIGH (ref 11.4–15.2)

## 2020-06-27 LAB — CBC
HCT: 34.2 % — ABNORMAL LOW (ref 39.0–52.0)
Hemoglobin: 11 g/dL — ABNORMAL LOW (ref 13.0–17.0)
MCH: 26.6 pg (ref 26.0–34.0)
MCHC: 32.2 g/dL (ref 30.0–36.0)
MCV: 82.6 fL (ref 80.0–100.0)
Platelets: 183 10*3/uL (ref 150–400)
RBC: 4.14 MIL/uL — ABNORMAL LOW (ref 4.22–5.81)
RDW: 15.2 % (ref 11.5–15.5)
WBC: 7.7 10*3/uL (ref 4.0–10.5)
nRBC: 0 % (ref 0.0–0.2)

## 2020-06-27 LAB — COOXEMETRY PANEL
Carboxyhemoglobin: 1.2 % (ref 0.5–1.5)
Methemoglobin: 0.9 % (ref 0.0–1.5)
O2 Saturation: 52.1 %
Total hemoglobin: 11.5 g/dL — ABNORMAL LOW (ref 12.0–16.0)

## 2020-06-27 SURGERY — ECHOCARDIOGRAM, TRANSESOPHAGEAL
Anesthesia: General

## 2020-06-27 MED ORDER — PROPOFOL 10 MG/ML IV BOLUS
INTRAVENOUS | Status: DC | PRN
  Administered 2020-06-27 (×2): 25 mg via INTRAVENOUS

## 2020-06-27 MED ORDER — SODIUM CHLORIDE 0.9 % IV SOLN
510.0000 mg | INTRAVENOUS | Status: AC
  Administered 2020-06-28: 510 mg via INTRAVENOUS
  Filled 2020-06-27: qty 17

## 2020-06-27 MED ORDER — PHENYLEPHRINE HCL-NACL 10-0.9 MG/250ML-% IV SOLN
INTRAVENOUS | Status: DC | PRN
  Administered 2020-06-27: 20 ug/min via INTRAVENOUS

## 2020-06-27 MED ORDER — HYDRALAZINE HCL 25 MG PO TABS
25.0000 mg | ORAL_TABLET | Freq: Three times a day (TID) | ORAL | Status: DC
  Administered 2020-06-27 – 2020-06-29 (×7): 25 mg via ORAL
  Filled 2020-06-27 (×7): qty 1

## 2020-06-27 MED ORDER — LIDOCAINE 2% (20 MG/ML) 5 ML SYRINGE
INTRAMUSCULAR | Status: DC | PRN
  Administered 2020-06-27 (×2): 40 mg via INTRAVENOUS

## 2020-06-27 MED ORDER — SODIUM CHLORIDE 0.9 % IV SOLN: INTRAVENOUS | Status: DC

## 2020-06-27 MED ORDER — PROPOFOL 500 MG/50ML IV EMUL
INTRAVENOUS | Status: DC | PRN
  Administered 2020-06-27: 100 ug/kg/min via INTRAVENOUS

## 2020-06-27 NOTE — Progress Notes (Signed)
  Echocardiogram Echocardiogram Transesophageal has been performed.  Douglas Villa 06/27/2020, 1:58 PM

## 2020-06-27 NOTE — Discharge Instructions (Signed)
Information on my medicine - ELIQUIS (apixaban)  Why was Eliquis prescribed for you? Eliquis was prescribed for you to reduce the risk of a blood clot forming that can cause a stroke if you have a medical condition called atrial fibrillation (a type of irregular heartbeat).  What do You need to know about Eliquis ? Take your Eliquis TWICE DAILY - one tablet in the morning and one tablet in the evening with or without food. If you have difficulty swallowing the tablet whole please discuss with your pharmacist how to take the medication safely.  Take Eliquis exactly as prescribed by your doctor and DO NOT stop taking Eliquis without talking to the doctor who prescribed the medication.  Stopping may increase your risk of developing a stroke.  Refill your prescription before you run out.  After discharge, you should have regular check-up appointments with your healthcare provider that is prescribing your Eliquis.  In the future your dose may need to be changed if your kidney function or weight changes by a significant amount or as you get older.  What do you do if you miss a dose? If you miss a dose, take it as soon as you remember on the same day and resume taking twice daily.  Do not take more than one dose of ELIQUIS at the same time to make up a missed dose.  Important Safety Information A possible side effect of Eliquis is bleeding. You should call your healthcare provider right away if you experience any of the following: ? Bleeding from an injury or your nose that does not stop. ? Unusual colored urine (red or dark brown) or unusual colored stools (red or black). ? Unusual bruising for unknown reasons. ? A serious fall or if you hit your head (even if there is no bleeding).  Some medicines may interact with Eliquis and might increase your risk of bleeding or clotting while on Eliquis. To help avoid this, consult your healthcare provider or pharmacist prior to using any new  prescription or non-prescription medications, including herbals, vitamins, non-steroidal anti-inflammatory drugs (NSAIDs) and supplements.  This website has more information on Eliquis (apixaban): http://www.eliquis.com/eliquis/home    De-escalation of Triple Therapy Post-PCI  Underwent cardiac catheterization with placement of drug-eluting stent on 06/20/20. Plan for triple therapy with aspirin 81 mg and clopidogrel (Plavix) in addition to oral anticoagulation using apixaban (Eliquis). Plan to discontinue aspirin on 07/21/20.

## 2020-06-27 NOTE — Transfer of Care (Signed)
Immediate Anesthesia Transfer of Care Note  Patient: Douglas Villa  Procedure(s) Performed: TRANSESOPHAGEAL ECHOCARDIOGRAM (TEE) (N/A ) CARDIOVERSION (N/A )  Patient Location: Endoscopy Unit  Anesthesia Type:General  Level of Consciousness: awake, alert  and drowsy  Airway & Oxygen Therapy: Patient Spontanous Breathing  Post-op Assessment: Report given to RN, Post -op Vital signs reviewed and stable and Patient moving all extremities  Post vital signs: Reviewed and stable  Last Vitals:  Vitals Value Taken Time  BP 112/70 06/27/20 1344  Temp    Pulse 80 06/27/20 1345  Resp 30 06/27/20 1345  SpO2 100 % 06/27/20 1345  Vitals shown include unvalidated device data.  Last Pain:  Vitals:   06/27/20 1217  TempSrc: Oral  PainSc: 0-No pain      Patients Stated Pain Goal: 0 (91/69/45 0388)  Complications: No complications documented.

## 2020-06-27 NOTE — Procedures (Signed)
Electrical Cardioversion Procedure Note Douglas Villa [id number] Oct 16, 1950  Procedure: Electrical Cardioversion Indications:  Atrial Flutter  Procedure Details Consent: Risks of procedure as well as the alternatives and risks of each were explained to the (patient/caregiver).  Consent for procedure obtained. Time Out: Verified patient identification, verified procedure, site/side was marked, verified correct patient position, special equipment/implants available, medications/allergies/relevent history reviewed, required imaging and test results available.  Performed  Patient placed on cardiac monitor, pulse oximetry, supplemental oxygen as necessary.  Sedation given: Propofol Pacer pads placed anterior and posterior chest.  Cardioverted 1 time(s).  Cardioverted at 150J.  Evaluation Findings: Post procedure EKG shows: NSR Complications: None Patient did tolerate procedure well.   Loralie Champagne 06/27/2020, 1:45 PM

## 2020-06-27 NOTE — H&P (View-Only) (Signed)
Patient ID: Douglas Villa, male   DOB: 14-Oct-1950, 70 y.o.   MRN: [id number]     Advanced Heart Failure Rounding Note  PCP-Cardiologist: None   Subjective:    5/21 Left IJ CVL and IABP removed  5/23 milrinone cut back 0.375 mcg 5/24 milrinone cut back to 0.25 mcg.  5/25 milrinone decreased to 0.125  Brisk diuresis yesterday - 6.2L in UOP. Net negative 5L. Wt down 12 lb.  Significant bump in SCr 1.9>1.7>1.52>>1.96 CVP low 0-2   He is on milrinone 0.125. Co-ox marginal but stable at 52%.   He remains in what looks like atypical atrial flutter with controlled rate.  He is on amiodarone gtt + apixaban.   Feels ok this morning. No complaints.   Objective:   Weight Range: 78.7 kg Body mass index is 28.86 kg/m.   Vital Signs:   Temp:  [98.4 F (36.9 C)-98.7 F (37.1 C)] 98.7 F (37.1 C) (05/27 0419) Pulse Rate:  [78-102] 98 (05/27 0419) Resp:  [18-24] 18 (05/27 0419) BP: (106-132)/(84-90) 111/87 (05/27 0419) SpO2:  [99 %-100 %] 100 % (05/27 0419) Weight:  [78.7 kg] 78.7 kg (05/27 0419) Last BM Date: 06/26/20  Weight change: Filed Weights   06/26/20 0324 06/27/20 0315 06/27/20 0419  Weight: 84.1 kg 78.7 kg 78.7 kg    Intake/Output:   Intake/Output Summary (Last 24 hours) at 06/27/2020 0817 Last data filed at 06/27/2020 0734 Gross per 24 hour  Intake 1214.61 ml  Output 6451 ml  Net -5236.39 ml      Physical Exam   CVP 0-2 General: well appearing, laying in bed. No distress Neck: JVD not elevated, no thyromegaly or thyroid nodule.  Lungs: Clear to auscultation bilaterally with normal respiratory effort. No wheezing  CV: Nondisplaced PMI.  Heart irregular S1/S2, no S3/S4, no murmur.  No peripheral edema.   Abdomen: Soft, nontender, no hepatosplenomegaly, no distention.  Skin: Intact without lesions or rashes.  Neurologic: Alert and oriented x 3.  Psych: Normal affect. Affect pleasant  Extremities: No clubbing or cyanosis.  HEENT: Normal. + TED hoses    Telemetry   Atrial flutter rate 90s (personally reviewed).   Labs    CBC Recent Labs    06/26/20 0454 06/27/20 0500  WBC 6.9 7.7  HGB 9.8* 11.0*  HCT 30.4* 34.2*  MCV 83.1 82.6  PLT 142* 510   Basic Metabolic Panel Recent Labs    06/26/20 0454 06/27/20 0500  NA 132* 134*  K 4.1 3.9  CL 103 100  CO2 21* 25  GLUCOSE 110* 103*  BUN 14 16  CREATININE 1.52* 1.96*  CALCIUM 8.4* 8.8*   Liver Function Tests Recent Labs    06/26/20 0454 06/27/20 0500  AST 38 49*  ALT 32 42  ALKPHOS 82 84  BILITOT 1.9* 2.1*  PROT 6.0* 6.4*  ALBUMIN 2.7* 2.9*   No results for input(s): LIPASE, AMYLASE in the last 72 hours. Cardiac Enzymes No results for input(s): CKTOTAL, CKMB, CKMBINDEX, TROPONINI in the last 72 hours.  BNP: BNP (last 3 results) No results for input(s): BNP in the last 8760 hours.  ProBNP (last 3 results) No results for input(s): PROBNP in the last 8760 hours.   D-Dimer No results for input(s): DDIMER in the last 72 hours. Hemoglobin A1C No results for input(s): HGBA1C in the last 72 hours. Fasting Lipid Panel No results for input(s): CHOL, HDL, LDLCALC, TRIG, CHOLHDL, LDLDIRECT in the last 72 hours. Thyroid Function Tests No results for input(s): TSH, T4TOTAL,  T3FREE, THYROIDAB in the last 72 hours.  Invalid input(s): FREET3  Other results:   Imaging    No results found.   Medications:     Scheduled Medications: . apixaban  5 mg Oral BID  . aspirin  81 mg Oral Daily  . atorvastatin  80 mg Oral q1800  . Chlorhexidine Gluconate Cloth  6 each Topical Daily  . clopidogrel  75 mg Oral Daily  . dapagliflozin propanediol  10 mg Oral Daily  . digoxin  0.125 mg Oral Daily  . hydrALAZINE  37.5 mg Oral Q8H  . insulin aspart  0-15 Units Subcutaneous TID WC  . insulin aspart  0-5 Units Subcutaneous QHS  . isosorbide mononitrate  30 mg Oral Daily  . sodium chloride flush  10-40 mL Intracatheter Q12H  . sodium chloride flush  3 mL Intravenous  Q12H  . spironolactone  25 mg Oral Daily    Infusions: . sodium chloride    . sodium chloride Stopped (06/21/20 1300)  . sodium chloride 10 mL/hr at 06/26/20 0330  . sodium chloride 20 mL/hr at 06/27/20 0704  . amiodarone 30 mg/hr (06/27/20 0137)  . ferumoxytol Stopped (06/25/20 1010)  . milrinone 0.125 mcg/kg/min (06/27/20 0136)    PRN Medications: sodium chloride, sodium chloride, acetaminophen, ondansetron (ZOFRAN) IV, sodium chloride flush, sodium chloride flush    Assessment/Plan   1. CAD: H/o DES to LCX in 3/19, had occluded PLV at that time. Admitted with anterior STEMI and occluded mLAD, had DES to mLAD, known CTO PLV with collaterals and patent LCx stent.  No chest pain.  - Continue ASA 81, Plavix. Will drop aspirin after 1 month as he is also on apixaban.  - Continue atorvastatin 80.  2. CKD: Stage 3, creatinine seems to be around 2 at baseline.  Creatinine down to 1.52 yesterday, up to 1.96 today after diuresis. CVP low 0-2 today. Hold diuretics today, likely resume tomorrow.    3. 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  Echo this admission EF 20-25%, moderately decreased RV function with mild RVE, moderate biatrial enlargement, PASP 34, mild MR, mild TR, IVC normal.   IABP out 5/22.  He is on milrinone 0.125 with co-ox down to 52% today.  CVP 0-2. Hold diuretics today, likely resume PO tomorrow.  - Stop milrinone now prior to DCCV - Continue hydralazine 37.5 mg tid and continue Imdur 30 mg daily.  - digoxin 0.125 daily, continue.  - spironolactone 25 mg daily.  - dapagliflozin 10 mg daily.    4. COPD: Prior smoker.  5. Type 2 diabetes: SSI for now, hgbA1c 7.7.   6. Atrial flutter: Atypical, newly noted this admission.   - Continue amiodarone gtt 30 mg/hr. Transition to PO post cardioversion  - Continue apixaban.  - TEE-DCCV today. Will stop milrinone prior.   7. Tricuspid regurgitation: Only mild on repeat complete echo.   8. Hyponatremia: Sodium trending up 134 today. Continue to restrict free water 9. Thrombocytopenia: 106>121>142>>183.  10. Anemia: Hgb 11.  Has had feraheme.   Lyda Jester, PA-C  06/27/2020 8:17 AM  Patient seen with PA agree with the above note.   Marked diuresis yesterday, weight down, CVP now about 2.  Co-ox (early am) 52%.  Creatinine higher at 1.96.   Remains in atrial flutter on amiodarone and apixaban.   Feels good, no dyspnea.   General: NAD Neck: No JVD, no thyromegaly  or thyroid nodule.  Lungs: Clear to auscultation bilaterally with normal respiratory effort. CV: Nondisplaced PMI.  Heart irregular S1/S2, no S3/S4, no murmur.  No peripheral edema.   Abdomen: Soft, nontender, no hepatosplenomegaly, no distention.  Skin: Intact without lesions or rashes.  Neurologic: Alert and oriented x 3.  Psych: Normal affect. Extremities: No clubbing or cyanosis.  HEENT: Normal.   No diuretic today, start back on po soon.   Repeat co-ox.  Will stop milrinone pre-cardioversion today.  With soft BP (100s), will decrease hydralazine to 25 mg tid.  Continue Imdur, dapagliflozin, spironolactone.    Plan for TEE-guided DCCV today.    Ambulate in hall, hopefully home over weekend.   Loralie Champagne 06/27/2020 9:02 AM

## 2020-06-27 NOTE — Anesthesia Preprocedure Evaluation (Addendum)
Anesthesia Evaluation  Patient identified by MRN, date of birth, ID band Patient awake    Reviewed: Allergy & Precautions, NPO status , Patient's Chart, lab work & pertinent test results  Airway Mallampati: II  TM Distance: >3 FB Neck ROM: Full    Dental no notable dental hx.    Pulmonary sleep apnea , COPD, former smoker,    Pulmonary exam normal breath sounds clear to auscultation       Cardiovascular hypertension, + CAD and + Past MI (actute STEMI, improving; on amiodarone gtt, milrinone gtt (stopped this AM); )  Normal cardiovascular exam+ dysrhythmias (on eliquis) Atrial Fibrillation  Rhythm:Regular Rate:Normal  ECHO: 06/21/20:  1. Left ventricular ejection fraction, by estimation, is 20 to 25%. The left ventricle has severely decreased function. The left ventricle demonstrates regional wall motion abnormalities (see scoring diagram/findings for description). The left  ventricular internal cavity size was mildly dilated. Left ventricular diastolic function could not be evaluated. 2. Right ventricular systolic function is moderately reduced. The right ventricular size is mildly enlarged. There is normal pulmonary artery systolic pressure. The estimated right ventricular systolic pressure is 69.6 mmHg. 3. Left atrial size was moderately dilated. 4. Right atrial size was moderately dilated. 5. The mitral valve is degenerative. Mild mitral valve regurgitation. No evidence of mitral stenosis. 6. The aortic valve is tricuspid. Aortic valve regurgitation is not visualized. Mild aortic valve sclerosis is present, with no evidence of aortic valve stenosis. 7. The inferior vena cava is normal in size with greater than 50% respiratory variability, suggesting right atrial pressure of 3 mmHg  EKG: 06/27/20 Atrial flutter with variable A-V block Cannot rule out Inferior infarct , age undetermined Prolonged QT Abnormal ECG     Neuro/Psych negative neurological ROS  negative psych ROS   GI/Hepatic negative GI ROS, Neg liver ROS,   Endo/Other  diabetes, Poorly Controlled, Type 2  Renal/GU Renal InsufficiencyRenal disease  negative genitourinary   Musculoskeletal negative musculoskeletal ROS (+)   Abdominal   Peds negative pediatric ROS (+)  Hematology  (+) anemia ,   Anesthesia Other Findings   Reproductive/Obstetrics negative OB ROS                            Anesthesia Physical Anesthesia Plan  ASA: IV  Anesthesia Plan: MAC   Post-op Pain Management:    Induction: Intravenous  PONV Risk Score and Plan: 1 and Propofol infusion, TIVA and Treatment may vary due to age or medical condition  Airway Management Planned: Natural Airway  Additional Equipment: None  Intra-op Plan:   Post-operative Plan:   Informed Consent: I have reviewed the patients History and Physical, chart, labs and discussed the procedure including the risks, benefits and alternatives for the proposed anesthesia with the patient or authorized representative who has indicated his/her understanding and acceptance.       Plan Discussed with: CRNA and Anesthesiologist  Anesthesia Plan Comments:        Anesthesia Quick Evaluation

## 2020-06-27 NOTE — Interval H&P Note (Signed)
History and Physical Interval Note:  06/27/2020 1:26 PM  Douglas Villa  has presented today for surgery, with the diagnosis of AFIB.  The various methods of treatment have been discussed with the patient and family. After consideration of risks, benefits and other options for treatment, the patient has consented to  Procedure(s): TRANSESOPHAGEAL ECHOCARDIOGRAM (TEE) (N/A) CARDIOVERSION (N/A) as a surgical intervention.  The patient's history has been reviewed, patient examined, no change in status, stable for surgery.  I have reviewed the patient's chart and labs.  Questions were answered to the patient's satisfaction.     Kiano Terrien [employer]

## 2020-06-27 NOTE — Progress Notes (Addendum)
Patient ID: Douglas Villa, male   DOB: 05/01/1950, 70 y.o.   MRN: [id number]     Advanced Heart Failure Rounding Note  PCP-Cardiologist: None   Subjective:    5/21 Left IJ CVL and IABP removed  5/23 milrinone cut back 0.375 mcg 5/24 milrinone cut back to 0.25 mcg.  5/25 milrinone decreased to 0.125  Brisk diuresis yesterday - 6.2L in UOP. Net negative 5L. Wt down 12 lb.  Significant bump in SCr 1.9>1.7>1.52>>1.96 CVP low 0-2   He is on milrinone 0.125. Co-ox marginal but stable at 52%.   He remains in what looks like atypical atrial flutter with controlled rate.  He is on amiodarone gtt + apixaban.   Feels ok this morning. No complaints.   Objective:   Weight Range: 78.7 kg Body mass index is 28.86 kg/m.   Vital Signs:   Temp:  [98.4 F (36.9 C)-98.7 F (37.1 C)] 98.7 F (37.1 C) (05/27 0419) Pulse Rate:  [78-102] 98 (05/27 0419) Resp:  [18-24] 18 (05/27 0419) BP: (106-132)/(84-90) 111/87 (05/27 0419) SpO2:  [99 %-100 %] 100 % (05/27 0419) Weight:  [78.7 kg] 78.7 kg (05/27 0419) Last BM Date: 06/26/20  Weight change: Filed Weights   06/26/20 0324 06/27/20 0315 06/27/20 0419  Weight: 84.1 kg 78.7 kg 78.7 kg    Intake/Output:   Intake/Output Summary (Last 24 hours) at 06/27/2020 0817 Last data filed at 06/27/2020 0734 Gross per 24 hour  Intake 1214.61 ml  Output 6451 ml  Net -5236.39 ml      Physical Exam   CVP 0-2 General: well appearing, laying in bed. No distress Neck: JVD not elevated, no thyromegaly or thyroid nodule.  Lungs: Clear to auscultation bilaterally with normal respiratory effort. No wheezing  CV: Nondisplaced PMI.  Heart irregular S1/S2, no S3/S4, no murmur.  No peripheral edema.   Abdomen: Soft, nontender, no hepatosplenomegaly, no distention.  Skin: Intact without lesions or rashes.  Neurologic: Alert and oriented x 3.  Psych: Normal affect. Affect pleasant  Extremities: No clubbing or cyanosis.  HEENT: Normal. + TED hoses    Telemetry   Atrial flutter rate 90s (personally reviewed).   Labs    CBC Recent Labs    06/26/20 0454 06/27/20 0500  WBC 6.9 7.7  HGB 9.8* 11.0*  HCT 30.4* 34.2*  MCV 83.1 82.6  PLT 142* 759   Basic Metabolic Panel Recent Labs    06/26/20 0454 06/27/20 0500  NA 132* 134*  K 4.1 3.9  CL 103 100  CO2 21* 25  GLUCOSE 110* 103*  BUN 14 16  CREATININE 1.52* 1.96*  CALCIUM 8.4* 8.8*   Liver Function Tests Recent Labs    06/26/20 0454 06/27/20 0500  AST 38 49*  ALT 32 42  ALKPHOS 82 84  BILITOT 1.9* 2.1*  PROT 6.0* 6.4*  ALBUMIN 2.7* 2.9*   No results for input(s): LIPASE, AMYLASE in the last 72 hours. Cardiac Enzymes No results for input(s): CKTOTAL, CKMB, CKMBINDEX, TROPONINI in the last 72 hours.  BNP: BNP (last 3 results) No results for input(s): BNP in the last 8760 hours.  ProBNP (last 3 results) No results for input(s): PROBNP in the last 8760 hours.   D-Dimer No results for input(s): DDIMER in the last 72 hours. Hemoglobin A1C No results for input(s): HGBA1C in the last 72 hours. Fasting Lipid Panel No results for input(s): CHOL, HDL, LDLCALC, TRIG, CHOLHDL, LDLDIRECT in the last 72 hours. Thyroid Function Tests No results for input(s): TSH, T4TOTAL,  T3FREE, THYROIDAB in the last 72 hours.  Invalid input(s): FREET3  Other results:   Imaging    No results found.   Medications:     Scheduled Medications: . apixaban  5 mg Oral BID  . aspirin  81 mg Oral Daily  . atorvastatin  80 mg Oral q1800  . Chlorhexidine Gluconate Cloth  6 each Topical Daily  . clopidogrel  75 mg Oral Daily  . dapagliflozin propanediol  10 mg Oral Daily  . digoxin  0.125 mg Oral Daily  . hydrALAZINE  37.5 mg Oral Q8H  . insulin aspart  0-15 Units Subcutaneous TID WC  . insulin aspart  0-5 Units Subcutaneous QHS  . isosorbide mononitrate  30 mg Oral Daily  . sodium chloride flush  10-40 mL Intracatheter Q12H  . sodium chloride flush  3 mL Intravenous  Q12H  . spironolactone  25 mg Oral Daily    Infusions: . sodium chloride    . sodium chloride Stopped (06/21/20 1300)  . sodium chloride 10 mL/hr at 06/26/20 0330  . sodium chloride 20 mL/hr at 06/27/20 0704  . amiodarone 30 mg/hr (06/27/20 0137)  . ferumoxytol Stopped (06/25/20 1010)  . milrinone 0.125 mcg/kg/min (06/27/20 0136)    PRN Medications: sodium chloride, sodium chloride, acetaminophen, ondansetron (ZOFRAN) IV, sodium chloride flush, sodium chloride flush    Assessment/Plan   1. CAD: H/o DES to LCX in 3/19, had occluded PLV at that time. Admitted with anterior STEMI and occluded mLAD, had DES to mLAD, known CTO PLV with collaterals and patent LCx stent.  No chest pain.  - Continue ASA 81, Plavix. Will drop aspirin after 1 month as he is also on apixaban.  - Continue atorvastatin 80.  2. CKD: Stage 3, creatinine seems to be around 2 at baseline.  Creatinine down to 1.52 yesterday, up to 1.96 today after diuresis. CVP low 0-2 today. Hold diuretics today, likely resume tomorrow.    3. 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  Echo this admission EF 20-25%, moderately decreased RV function with mild RVE, moderate biatrial enlargement, PASP 34, mild MR, mild TR, IVC normal.   IABP out 5/22.  He is on milrinone 0.125 with co-ox down to 52% today.  CVP 0-2. Hold diuretics today, likely resume PO tomorrow.  - Stop milrinone now prior to DCCV - Continue hydralazine 37.5 mg tid and continue Imdur 30 mg daily.  - digoxin 0.125 daily, continue.  - spironolactone 25 mg daily.  - dapagliflozin 10 mg daily.    4. COPD: Prior smoker.  5. Type 2 diabetes: SSI for now, hgbA1c 7.7.   6. Atrial flutter: Atypical, newly noted this admission.   - Continue amiodarone gtt 30 mg/hr. Transition to PO post cardioversion  - Continue apixaban.  - TEE-DCCV today. Will stop milrinone prior.   7. Tricuspid regurgitation: Only mild on repeat complete echo.   8. Hyponatremia: Sodium trending up 134 today. Continue to restrict free water 9. Thrombocytopenia: 106>121>142>>183.  10. Anemia: Hgb 11.  Has had feraheme.   Lyda Jester, PA-C  06/27/2020 8:17 AM  Patient seen with PA agree with the above note.   Marked diuresis yesterday, weight down, CVP now about 2.  Co-ox (early am) 52%.  Creatinine higher at 1.96.   Remains in atrial flutter on amiodarone and apixaban.   Feels good, no dyspnea.   General: NAD Neck: No JVD, no thyromegaly  or thyroid nodule.  Lungs: Clear to auscultation bilaterally with normal respiratory effort. CV: Nondisplaced PMI.  Heart irregular S1/S2, no S3/S4, no murmur.  No peripheral edema.   Abdomen: Soft, nontender, no hepatosplenomegaly, no distention.  Skin: Intact without lesions or rashes.  Neurologic: Alert and oriented x 3.  Psych: Normal affect. Extremities: No clubbing or cyanosis.  HEENT: Normal.   No diuretic today, start back on po soon.   Repeat co-ox.  Will stop milrinone pre-cardioversion today.  With soft BP (100s), will decrease hydralazine to 25 mg tid.  Continue Imdur, dapagliflozin, spironolactone.    Plan for TEE-guided DCCV today.    Ambulate in hall, hopefully home over weekend.   Loralie Champagne 06/27/2020 9:02 AM

## 2020-06-27 NOTE — Anesthesia Procedure Notes (Signed)
Procedure Name: MAC Performed by: Rande Brunt, CRNA Pre-anesthesia Checklist: Patient identified, Emergency Drugs available, Suction available and Patient being monitored Patient Re-evaluated:Patient Re-evaluated prior to induction Oxygen Delivery Method: Nasal cannula Preoxygenation: Pre-oxygenation with 100% oxygen Induction Type: IV induction Placement Confirmation: CO2 detector and positive ETCO2 Dental Injury: Teeth and Oropharynx as per pre-operative assessment

## 2020-06-27 NOTE — CV Procedure (Signed)
Procedure: TEE  Indication: Atrial flutter  Sedation: Per anesthesiology  Findings: Please see echo section for full report.  Mildly dilated LV with mild LV hypertrophy.  EF 20-25%, diffuse hypokinesis.  Mildly dilated RV with moderately decreased systolic function.  Mild-moderate LA enlargement, moderate RA enlargement.  No LA appendage thrombus. No PFO/ASD by color doppler.  Moderate mitral regurgitation with restriction of the posterior leaflet.  Trivial TR.  Trileaflet aortic valve with no stenosis, trivial regurgitation.  Normal caliber thoracic aorta with grade 3 plaque.   No LA appendage thrombus, may proceed to DCCV.   Douglas Villa 06/27/2020 1:43 PM

## 2020-06-27 NOTE — TOC Progression Note (Addendum)
Transition of Care (TOC) - Progression Note  Heart Failure   Patient Details  Name: Douglas Villa MRN: [id number] Date of Birth: 18-Jun-1950  Transition of [facility]) CM/SW [place], [place] Phone Number: 06/27/2020, 3:53 PM  Clinical Narrative:    CSW spoke with the patient at bedside to bring them an appointment card for the [facility] outpatient clinic and encouraged them to follow up and to attend the appointment and bring their medications and if anything changes to please reach out so that CSW/HV clinic team can provide support.  TOC will continue to follow for discharge needs.      Barriers to Discharge: Continued Medical Work up  Expected Discharge Plan and Services   In-house Referral: Clinical Social Work     Living arrangements for the past 2 months: Single Family Home                 DME Arranged: Life vest DME Agency: [employer] Date DME Agency Contacted: 06/27/20 Time DME Agency Contacted: [phone] Representative spoke with at DME Agency: [place] Determinants of Health ([place]) Interventions Food Insecurity Interventions: Assist with [employer] Application Financial Strain Interventions: Intervention Not Indicated Housing Interventions: Intervention Not Indicated Transportation Interventions: Intervention Not Indicated  Readmission Risk Interventions No flowsheet data found.   Gladine Plude, MSW, [place] Heart Failure Social Worker

## 2020-06-27 NOTE — Anesthesia Postprocedure Evaluation (Signed)
Anesthesia Post Note  Patient: Douglas Villa  Procedure(s) Performed: TRANSESOPHAGEAL ECHOCARDIOGRAM (TEE) (N/A ) CARDIOVERSION (N/A )     Patient location during evaluation: Endoscopy Anesthesia Type: General Level of consciousness: awake and alert Pain management: pain level controlled Vital Signs Assessment: post-procedure vital signs reviewed and stable Respiratory status: spontaneous breathing, nonlabored ventilation, respiratory function stable and patient connected to nasal cannula oxygen Cardiovascular status: stable and blood pressure returned to baseline Postop Assessment: no apparent nausea or vomiting Anesthetic complications: no   No complications documented.  Last Vitals:  Vitals:   06/27/20 1355 06/27/20 1405  BP: 123/78 120/86  Pulse: 79 80  Resp: (!) 27 18  Temp:    SpO2: 100% 100%    Last Pain:  Vitals:   06/27/20 1405  TempSrc:   PainSc: 0-No pain                 Belenda Cruise P Sahory Nordling

## 2020-06-27 NOTE — Progress Notes (Signed)
     Given anterior MI and LVEF <35%, Dr. Aundra Dubin recommended pt be discharged home w/ LifeVest for primary prevention, however pt declines, citing he wore a LifeVest several years ago when he lived in [place] and could not tolerate wearing it. He was informed of indication and risk of being discharged w/o protection. He understands the risk and still declines LifeVest. Dr. Aundra Dubin notified.   Lyda Jester, PA-C 06/27/2020

## 2020-06-28 LAB — COMPREHENSIVE METABOLIC PANEL
ALT: 37 U/L (ref 0–44)
AST: 35 U/L (ref 15–41)
Albumin: 2.8 g/dL — ABNORMAL LOW (ref 3.5–5.0)
Alkaline Phosphatase: 89 U/L (ref 38–126)
Anion gap: 7 (ref 5–15)
BUN: 20 mg/dL (ref 8–23)
CO2: 26 mmol/L (ref 22–32)
Calcium: 8.6 mg/dL — ABNORMAL LOW (ref 8.9–10.3)
Chloride: 101 mmol/L (ref 98–111)
Creatinine, Ser: 1.96 mg/dL — ABNORMAL HIGH (ref 0.61–1.24)
GFR, Estimated: 36 mL/min — ABNORMAL LOW (ref >60)
Glucose, Bld: 96 mg/dL (ref 70–99)
Potassium: 4 mmol/L (ref 3.5–5.1)
Sodium: 134 mmol/L — ABNORMAL LOW (ref 135–145)
Total Bilirubin: 1.7 mg/dL — ABNORMAL HIGH (ref 0.3–1.2)
Total Protein: 6.1 g/dL — ABNORMAL LOW (ref 6.5–8.1)

## 2020-06-28 LAB — GLUCOSE, CAPILLARY
Glucose-Capillary: 92 mg/dL (ref 70–99)
Glucose-Capillary: 152 mg/dL — ABNORMAL HIGH (ref 70–99)
Glucose-Capillary: 197 mg/dL — ABNORMAL HIGH (ref 70–99)
Glucose-Capillary: 121 mg/dL — ABNORMAL HIGH (ref 70–99)
Glucose-Capillary: 85 mg/dL (ref 70–99)

## 2020-06-28 LAB — CBC
HCT: 32.6 % — ABNORMAL LOW (ref 39.0–52.0)
Hemoglobin: 10.7 g/dL — ABNORMAL LOW (ref 13.0–17.0)
MCH: 27.1 pg (ref 26.0–34.0)
MCHC: 32.8 g/dL (ref 30.0–36.0)
MCV: 82.5 fL (ref 80.0–100.0)
Platelets: 185 10*3/uL (ref 150–400)
RBC: 3.95 MIL/uL — ABNORMAL LOW (ref 4.22–5.81)
RDW: 15.1 % (ref 11.5–15.5)
WBC: 6.5 10*3/uL (ref 4.0–10.5)
nRBC: 0 % (ref 0.0–0.2)

## 2020-06-28 LAB — COOXEMETRY PANEL
Carboxyhemoglobin: 1.2 % (ref 0.5–1.5)
Methemoglobin: 0.8 % (ref 0.0–1.5)
O2 Saturation: 53.2 %
Total hemoglobin: 10.9 g/dL — ABNORMAL LOW (ref 12.0–16.0)

## 2020-06-28 MED ORDER — AMIODARONE HCL 200 MG PO TABS
200.0000 mg | ORAL_TABLET | Freq: Two times a day (BID) | ORAL | Status: DC
  Administered 2020-06-28 – 2020-06-29 (×3): 200 mg via ORAL
  Filled 2020-06-28 (×4): qty 1

## 2020-06-28 NOTE — Progress Notes (Signed)
Patient ID: Douglas Villa, male   DOB: September 11, 1950, 70 y.o.   MRN: [id number]     Advanced Heart Failure Rounding Note  PCP-Cardiologist: None   Subjective:    5/21 Left IJ CVL and IABP removed  5/23 milrinone cut back 0.375 mcg 5/24 milrinone cut back to 0.25 mcg.  5/25 milrinone decreased to 0.125 5/27 milrinone stopped 5/27 TEE/DC-CV EF 20-25%, diffuse hypokinesis. RV moderately decreased Moderate MR  S/p TEE/DC-CV yesterday. Remains in NSR. Off milrinone. Co-ox 53% CVP not set up.   Feels good. Denies CP or SOB.   Objective:   Weight Range: 77.7 kg Body mass index is 26.85 kg/m.   Vital Signs:   Temp:  [97.7 F (36.5 C)-98.6 F (37 C)] 98.1 F (36.7 C) (05/28 1149) Pulse Rate:  [76-82] 76 (05/28 0716) Resp:  [16-28] 23 (05/28 1149) BP: (112-124)/(70-86) 118/76 (05/28 1149) SpO2:  [100 %] 100 % (05/28 0716) Weight:  [77.7 kg] 77.7 kg (05/28 0613) Last BM Date: 06/26/20  Weight change: Filed Weights   06/27/20 0419 06/27/20 1217 06/28/20 0613  Weight: 78.7 kg 78 kg 77.7 kg    Intake/Output:   Intake/Output Summary (Last 24 hours) at 06/28/2020 1239 Last data filed at 06/28/2020 0718 Gross per 24 hour  Intake 148.81 ml  Output 300 ml  Net -151.19 ml      Physical Exam   General:  Sitting in chair  No resp difficulty HEENT: normal Neck: supple. no JVD. Carotids 2+ bilat; no bruits. No lymphadenopathy or thryomegaly appreciated. Cor: PMI nondisplaced. Regular rate & rhythm. No rubs, gallops or murmurs. Lungs: clear Abdomen: soft, nontender, nondistended. No hepatosplenomegaly. No bruits or masses. Good bowel sounds. Extremities: no cyanosis, clubbing, rash, edema Neuro: alert & orientedx3, cranial nerves grossly intact. moves all 4 extremities w/o difficulty. Affect pleasant   Telemetry    Sinus 70s Personally reviewed   Labs    CBC Recent Labs    06/27/20 0500 06/28/20 0500  WBC 7.7 6.5  HGB 11.0* 10.7*  HCT 34.2* 32.6*  MCV 82.6 82.5   PLT 183 010   Basic Metabolic Panel Recent Labs    06/27/20 0500 06/28/20 0500  NA 134* 134*  K 3.9 4.0  CL 100 101  CO2 25 26  GLUCOSE 103* 96  BUN 16 20  CREATININE 1.96* 1.96*  CALCIUM 8.8* 8.6*   Liver Function Tests Recent Labs    06/27/20 0500 06/28/20 0500  AST 49* 35  ALT 42 37  ALKPHOS 84 89  BILITOT 2.1* 1.7*  PROT 6.4* 6.1*  ALBUMIN 2.9* 2.8*   No results for input(s): LIPASE, AMYLASE in the last 72 hours. Cardiac Enzymes No results for input(s): CKTOTAL, CKMB, CKMBINDEX, TROPONINI in the last 72 hours.  BNP: BNP (last 3 results) No results for input(s): BNP in the last 8760 hours.  ProBNP (last 3 results) No results for input(s): PROBNP in the last 8760 hours.   D-Dimer No results for input(s): DDIMER in the last 72 hours. Hemoglobin A1C No results for input(s): HGBA1C in the last 72 hours. Fasting Lipid Panel No results for input(s): CHOL, HDL, LDLCALC, TRIG, CHOLHDL, LDLDIRECT in the last 72 hours. Thyroid Function Tests No results for input(s): TSH, T4TOTAL, T3FREE, THYROIDAB in the last 72 hours.  Invalid input(s): FREET3  Other results:   Imaging    No results found.   Medications:     Scheduled Medications: . apixaban  5 mg Oral BID  . aspirin  81 mg Oral  Daily  . atorvastatin  80 mg Oral q1800  . Chlorhexidine Gluconate Cloth  6 each Topical Daily  . clopidogrel  75 mg Oral Daily  . dapagliflozin propanediol  10 mg Oral Daily  . digoxin  0.125 mg Oral Daily  . hydrALAZINE  25 mg Oral Q8H  . insulin aspart  0-15 Units Subcutaneous TID WC  . insulin aspart  0-5 Units Subcutaneous QHS  . isosorbide mononitrate  30 mg Oral Daily  . sodium chloride flush  10-40 mL Intracatheter Q12H  . sodium chloride flush  3 mL Intravenous Q12H  . spironolactone  25 mg Oral Daily    Infusions: . sodium chloride    . sodium chloride Stopped (06/21/20 1300)  . sodium chloride 10 mL/hr at 06/27/20 1318  . amiodarone 30 mg/hr (06/28/20  [id number])    PRN Medications: sodium chloride, sodium chloride, acetaminophen, ondansetron (ZOFRAN) IV, sodium chloride flush, sodium chloride flush    Assessment/Plan   1. CAD: H/o DES to LCX in 3/19, had occluded PLV at that time. Admitted with anterior STEMI and occluded mLAD, had DES to mLAD, known CTO PLV with collaterals and patent LCx stent.  No s/s ischemia - Continue ASA 81, Plavix. Will drop aspirin after 1 month as he is also on apixaban.  - Continue atorvastatin 80.  - Refuses LifeVest 2. CKD: Stage 3, creatinine seems to be around 2 at baseline.  Creatinine down to 1.52 5/26, stable today at 1.96 today. Diuretics on hold  3. 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  Echo this admission EF 20-25%, moderately decreased RV function with mild RVE, moderate biatrial enlargement, PASP 34, mild MR, mild TR, IVC normal.   IABP out 5/22.  Milrinone stopped 5/27 Co-ox unchanged at 53%. JVP remains low. Hold diuretics again today - Continue hydralazine 25 mg tid and continue Imdur 30 mg daily. (dose dropped due to low BP) - digoxin 0.125 daily, continue. Dig level 0.5 on 5/26 - spironolactone 25 mg daily.  - dapagliflozin 10 mg daily.    - No b-blocker with low output  4. COPD: Prior smoker.  5. Type 2 diabetes: SSI for now, hgbA1c 7.7.   6. Atrial flutter: Atypical, newly noted this admission.   - s/p TEE/DC-CV on 5/27. Remains in NSR - Continue apixaban.  - Switch IV amio to po  7. Tricuspid regurgitation: Only mild on repeat complete echo.   8. Hyponatremia: Sodium stable 134 9. Thrombocytopenia: resolved 10. Anemia: Hgb 10.7.  Has had feraheme.   Possible home in am.   Glori Bickers, MD 06/28/2020 12:39 PM

## 2020-06-28 NOTE — Progress Notes (Signed)
CARDIAC REHAB PHASE I   PRE:  Rate/Rhythm: 77 SR    BP: sitting 116/76    SaO2: 98 RA  MODE:  Ambulation: 470 ft   POST:  Rate/Rhythm: 88 SR    BP: sitting 113/76     SaO2: 92-94 RA  Min assist to stand, contact guard to walk hall. Felt good to get up and move. To recliner. VSS, no c/o. Reviewed ed. Pt able to st 2000 mg sodium restriction. Encouraged more walking. [place], ACSM 06/28/2020 11:44 AM

## 2020-06-28 NOTE — Progress Notes (Signed)
Patient had a unevenful day walk in the hallway with no distress. CVP baseline was at 6 and unable to obtain reading for noon and four pm.

## 2020-06-29 DIAGNOSIS — E871 Hypo-osmolality and hyponatremia: Secondary | ICD-10-CM

## 2020-06-29 DIAGNOSIS — I071 Rheumatic tricuspid insufficiency: Secondary | ICD-10-CM

## 2020-06-29 DIAGNOSIS — I4891 Unspecified atrial fibrillation: Secondary | ICD-10-CM

## 2020-06-29 DIAGNOSIS — D696 Thrombocytopenia, unspecified: Secondary | ICD-10-CM

## 2020-06-29 DIAGNOSIS — D649 Anemia, unspecified: Secondary | ICD-10-CM

## 2020-06-29 LAB — GLUCOSE, CAPILLARY
Glucose-Capillary: 84 mg/dL (ref 70–99)
Glucose-Capillary: 242 mg/dL — ABNORMAL HIGH (ref 70–99)
Glucose-Capillary: 76 mg/dL (ref 70–99)

## 2020-06-29 LAB — COMPREHENSIVE METABOLIC PANEL
ALT: 30 U/L (ref 0–44)
AST: 27 U/L (ref 15–41)
Albumin: 2.7 g/dL — ABNORMAL LOW (ref 3.5–5.0)
Alkaline Phosphatase: 86 U/L (ref 38–126)
Anion gap: 7 (ref 5–15)
BUN: 20 mg/dL (ref 8–23)
CO2: 24 mmol/L (ref 22–32)
Calcium: 8.3 mg/dL — ABNORMAL LOW (ref 8.9–10.3)
Chloride: 103 mmol/L (ref 98–111)
Creatinine, Ser: 1.94 mg/dL — ABNORMAL HIGH (ref 0.61–1.24)
GFR, Estimated: 36 mL/min — ABNORMAL LOW (ref >60)
Glucose, Bld: 87 mg/dL (ref 70–99)
Potassium: 4 mmol/L (ref 3.5–5.1)
Sodium: 134 mmol/L — ABNORMAL LOW (ref 135–145)
Total Bilirubin: 1.5 mg/dL — ABNORMAL HIGH (ref 0.3–1.2)
Total Protein: 5.9 g/dL — ABNORMAL LOW (ref 6.5–8.1)

## 2020-06-29 LAB — CBC
HCT: 31.8 % — ABNORMAL LOW (ref 39.0–52.0)
Hemoglobin: 10.4 g/dL — ABNORMAL LOW (ref 13.0–17.0)
MCH: 27.4 pg (ref 26.0–34.0)
MCHC: 32.7 g/dL (ref 30.0–36.0)
MCV: 83.9 fL (ref 80.0–100.0)
Platelets: 198 10*3/uL (ref 150–400)
RBC: 3.79 MIL/uL — ABNORMAL LOW (ref 4.22–5.81)
RDW: 15.6 % — ABNORMAL HIGH (ref 11.5–15.5)
WBC: 5.6 10*3/uL (ref 4.0–10.5)
nRBC: 0 % (ref 0.0–0.2)

## 2020-06-29 LAB — COOXEMETRY PANEL
Carboxyhemoglobin: 1.3 % (ref 0.5–1.5)
Methemoglobin: 1.4 % (ref 0.0–1.5)
O2 Saturation: 52.5 %
Total hemoglobin: 10.4 g/dL — ABNORMAL LOW (ref 12.0–16.0)

## 2020-06-29 MED ORDER — APIXABAN 5 MG PO TABS: 5.0000 mg | ORAL_TABLET | Freq: Two times a day (BID) | ORAL | 2 refills | Status: DC

## 2020-06-29 MED ORDER — DIGOXIN 125 MCG PO TABS: 0.1250 mg | ORAL_TABLET | Freq: Every day | ORAL | 2 refills | Status: DC

## 2020-06-29 MED ORDER — CLOPIDOGREL BISULFATE 75 MG PO TABS: 75.0000 mg | ORAL_TABLET | Freq: Every day | ORAL | 11 refills | Status: DC

## 2020-06-29 MED ORDER — MUSCLE RUB 10-15 % EX CREA
TOPICAL_CREAM | CUTANEOUS | Status: DC | PRN
  Filled 2020-06-29: qty 85

## 2020-06-29 MED ORDER — TORSEMIDE 40 MG PO TABS: 40.0000 mg | ORAL_TABLET | Freq: Every day | ORAL | 2 refills | Status: DC

## 2020-06-29 MED ORDER — AMIODARONE HCL 200 MG PO TABS: 200.0000 mg | ORAL_TABLET | Freq: Two times a day (BID) | ORAL | 2 refills | Status: DC

## 2020-06-29 MED ORDER — POTASSIUM CHLORIDE CRYS ER 20 MEQ PO TBCR: 20.0000 meq | EXTENDED_RELEASE_TABLET | Freq: Every day | ORAL | 2 refills | Status: DC

## 2020-06-29 MED ORDER — POTASSIUM CHLORIDE CRYS ER 20 MEQ PO TBCR
20.0000 meq | EXTENDED_RELEASE_TABLET | Freq: Every day | ORAL | Status: DC
  Administered 2020-06-29: 20 meq via ORAL
  Filled 2020-06-29: qty 1

## 2020-06-29 MED ORDER — DAPAGLIFLOZIN PROPANEDIOL 10 MG PO TABS: 10.0000 mg | ORAL_TABLET | Freq: Every day | ORAL | 2 refills | Status: DC

## 2020-06-29 MED ORDER — CLOPIDOGREL BISULFATE 75 MG PO TABS: 75.0000 mg | ORAL_TABLET | Freq: Every day | ORAL | 2 refills | Status: DC

## 2020-06-29 MED ORDER — TORSEMIDE 20 MG PO TABS
40.0000 mg | ORAL_TABLET | Freq: Every day | ORAL | Status: DC
  Administered 2020-06-29: 40 mg via ORAL
  Filled 2020-06-29: qty 2

## 2020-06-29 NOTE — Progress Notes (Signed)
Patient ID: Douglas Villa, male   DOB: 03-Nov-1950, 70 y.o.   MRN: [id number]     Advanced Heart Failure Rounding Note  PCP-Cardiologist: None   Subjective:    5/21 Left IJ CVL and IABP removed  5/23 milrinone cut back 0.375 mcg 5/24 milrinone cut back to 0.25 mcg.  5/25 milrinone decreased to 0.125 5/27 milrinone stopped 5/27 TEE/DC-CV EF 20-25%, diffuse hypokinesis. RV moderately decreased Moderate MR  Feels good. Wants to go home. Denies CP or SOB. Co-ox stable at 53%   Objective:   Weight Range: 77.9 kg Body mass index is 26.9 kg/m.   Vital Signs:   Temp:  [98.1 F (36.7 C)-98.8 F (37.1 C)] 98.8 F (37.1 C) (05/29 0937) Pulse Rate:  [76-80] 76 (05/29 0712) Resp:  [18-23] 19 (05/29 0712) BP: (101-124)/(70-81) 124/81 (05/29 0712) SpO2:  [100 %] 100 % (05/29 0937) Weight:  [77.9 kg] 77.9 kg (05/29 0605) Last BM Date: 06/28/20  Weight change: Filed Weights   06/27/20 1217 06/28/20 0613 06/29/20 0605  Weight: 78 kg 77.7 kg 77.9 kg    Intake/Output:   Intake/Output Summary (Last 24 hours) at 06/29/2020 1126 Last data filed at 06/29/2020 1100 Gross per 24 hour  Intake 250 ml  Output 875 ml  Net -625 ml      Physical Exam   General:  Well appearing. No resp difficulty HEENT: normal Neck: supple. no JVD. Carotids 2+ bilat; no bruits. No lymphadenopathy or thryomegaly appreciated. Cor: PMI nondisplaced. Regular rate & rhythm. No rubs, gallops or murmurs. Lungs: clear Abdomen: soft, nontender, nondistended. No hepatosplenomegaly. No bruits or masses. Good bowel sounds. Extremities: no cyanosis, clubbing, rash, edema Neuro: alert & orientedx3, cranial nerves grossly intact. moves all 4 extremities w/o difficulty. Affect pleasant   Telemetry    Sinus 70-80s Personally reviewed    Labs    CBC Recent Labs    06/28/20 0500 06/29/20 0453  WBC 6.5 5.6  HGB 10.7* 10.4*  HCT 32.6* 31.8*  MCV 82.5 83.9  PLT 185 157   Basic Metabolic Panel Recent Labs     06/28/20 0500 06/29/20 0453  NA 134* 134*  K 4.0 4.0  CL 101 103  CO2 26 24  GLUCOSE 96 87  BUN 20 20  CREATININE 1.96* 1.94*  CALCIUM 8.6* 8.3*   Liver Function Tests Recent Labs    06/28/20 0500 06/29/20 0453  AST 35 27  ALT 37 30  ALKPHOS 89 86  BILITOT 1.7* 1.5*  PROT 6.1* 5.9*  ALBUMIN 2.8* 2.7*   No results for input(s): LIPASE, AMYLASE in the last 72 hours. Cardiac Enzymes No results for input(s): CKTOTAL, CKMB, CKMBINDEX, TROPONINI in the last 72 hours.  BNP: BNP (last 3 results) No results for input(s): BNP in the last 8760 hours.  ProBNP (last 3 results) No results for input(s): PROBNP in the last 8760 hours.   D-Dimer No results for input(s): DDIMER in the last 72 hours. Hemoglobin A1C No results for input(s): HGBA1C in the last 72 hours. Fasting Lipid Panel No results for input(s): CHOL, HDL, LDLCALC, TRIG, CHOLHDL, LDLDIRECT in the last 72 hours. Thyroid Function Tests No results for input(s): TSH, T4TOTAL, T3FREE, THYROIDAB in the last 72 hours.  Invalid input(s): FREET3  Other results:   Imaging    No results found.   Medications:     Scheduled Medications: . amiodarone  200 mg Oral BID  . apixaban  5 mg Oral BID  . aspirin  81 mg Oral Daily  .  atorvastatin  80 mg Oral q1800  . Chlorhexidine Gluconate Cloth  6 each Topical Daily  . clopidogrel  75 mg Oral Daily  . dapagliflozin propanediol  10 mg Oral Daily  . digoxin  0.125 mg Oral Daily  . hydrALAZINE  25 mg Oral Q8H  . insulin aspart  0-15 Units Subcutaneous TID WC  . insulin aspart  0-5 Units Subcutaneous QHS  . isosorbide mononitrate  30 mg Oral Daily  . sodium chloride flush  10-40 mL Intracatheter Q12H  . sodium chloride flush  3 mL Intravenous Q12H  . spironolactone  25 mg Oral Daily    Infusions: . sodium chloride    . sodium chloride Stopped (06/21/20 1300)  . sodium chloride 10 mL/hr at 06/27/20 1318    PRN Medications: sodium chloride, sodium chloride,  acetaminophen, Muscle Rub, ondansetron (ZOFRAN) IV, sodium chloride flush, sodium chloride flush    Assessment/Plan   1. CAD: H/o DES to LCX in 3/19, had occluded PLV at that time. Admitted with anterior STEMI and occluded mLAD, had DES to mLAD, known CTO PLV with collaterals and patent LCx stent.  No s/s ischemia - Continue ASA 81, Plavix. Will drop aspirin after 1 month as he is also on apixaban.  - Continue atorvastatin 80.  - Refuses LifeVest 2. CKD: Stage 3, creatinine seems to be around 2 at baseline.  Creatinine down to 1.52 5/26, stable today at 1.96 today. Diuretics on hold  3. 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  Echo this admission EF 20-25%, moderately decreased RV function with mild RVE, moderate biatrial enlargement, PASP 34, mild MR, mild TR, IVC normal.   IABP out 5/22.  Milrinone stopped 5/27 Co-ox unchanged at 53%. JVP ok - Continue hydralazine 25 mg tid and continue Imdur 30 mg daily. (dose dropped due to low BP) - digoxin 0.125 daily, continue. Dig level 0.5 on 5/26 - spironolactone 25 mg daily.  - dapagliflozin 10 mg daily.    - No b-blocker with low output  4. COPD: Prior smoker.  5. Type 2 diabetes: SSI for now, hgbA1c 7.7.   6. Atrial flutter: Atypical, newly noted this admission.   - s/p TEE/DC-CV on 5/27. Remains in NSR - Continue apixaban.  - Switch IV amio to po  7. Tricuspid regurgitation: Only mild on repeat complete echo.   8. Hyponatremia: Sodium stable 134 9. Thrombocytopenia: resolved 10. Anemia: Hgb 10.4   Has had feraheme.   Ok to go home today  D/c on above meds. Add torsemide 40 daily with kdur 20 daily  Amio 200 po bid    Glori Bickers, MD 06/29/2020 11:26 AM

## 2020-06-29 NOTE — Discharge Summary (Addendum)
Discharge Summary    Patient ID: Douglas Villa MRN: [id number]; DOB: 05-07-1950  Admit date: 06/20/2020 Discharge date: 06/29/2020  PCP:  Lillard Anes, MD   [facility] Providers  Advanced Heart Failure:  Loralie Champagne, MD  {   Discharge Diagnoses    Principal Problem:   Acute ST elevation myocardial infarction (STEMI) due to occlusion of distal portion of left anterior descending (LAD) coronary artery [facility]) Active Problems:   Hyperlipidemia   Acute on chronic systolic heart failure ([place])   Chronic kidney disease, stage 3 ([place])   GOLD COPD II B   Type 2 diabetes mellitus with diabetic nephropathy ([place])   Cardiogenic shock ([place])   STEMI involving left anterior descending coronary artery ([place])   Atrial fibrillation ([place])   Tricuspid regurgitation   Anemia   Hyponatremia   Thrombocytopenia ([place])  Diagnostic Studies/Procedures    LHC 06/20/20:  Ost LAD to Prox LAD lesion is 20% stenosed. Mid LAD lesion is 20% stenosed. Dist LAD lesion is 100% stenosed. Ost RCA to Prox RCA lesion is 20% stenosed. Previously placed 1st Mrg stent (unknown type) is widely patent. 3rd RPL lesion is 100% stenosed.   Acute anterolateral myocardial infarction secondary to mid-distal total occlusion of the LAD.    The LAD had mild nonobstructive disease in the proximal to mid segment.   Widely patent stent in the circumflex vessel.   Mild luminal irregularity of the RCA with total occlusion of the PLA branch posteriorly with left to right collateralization.   Successful coronary intervention with PTCA/DES stenting of the mid distal LAD with insertion of a 2.0 x 22 mm Resolute Onyx stent restoration of TIMI-3 flow.  There was initial spasm proximal  and distal stent following stent implantation.   Cardiogenic shock with severe biventricular failure and EF estimate approximately 10%.  There is severe tricuspid regurgitation.   Cardiac power output [facility]): 0.44    Pulmonary  artery pulsatility index (PAPI): 1.0   Insertion of intra-aortic balloon pump.    RECOMMENDATION: DAPT for minimum of 1 year.  The patient will continue with intra-aortic balloon pump support, milrinone and levophed presently.  He is now on intravenous amiodarone drip following bolus x2 for atrial flutter with variable block.  Creatinine this [facility] at [facility] was 2.6.  Close follow-up of renal function will be necessary.  Diagnostic Dominance: Right    Intervention    Echocardiogram 06/20/2020:   1. Left ventricular ejection fraction, by estimation, is <20%. The left  ventricle has severely decreased function. The left ventricle demonstrates  global hypokinesis. There is the interventricular septum is flattened in  diastole ('D' shaped left  ventricle), consistent with right ventricular volume overload. Ventricular  septum appears intact with no evidence of shunting by color doppler  interrogation.   2. Right ventricular systolic function is severely reduced. The right  ventricular size is mildly enlarged. A Swanz-Ganz catheter is visualized.   3. The tricuspid valve structure appears normal. There is severe,  unobstructive tricuspid regurgtiation that appears functional due to lack  of central coaptation of TV leaflets.   4. Left atrial size was severely dilated.   5. Right atrial size was severely dilated.   6. The mitral valve is degenerative. Moderate mitral annular  calcification.   Comparison(s): Compared to prior TTE in 2020, the LVEF is now <20%  (previously 20-25%) with RV failure (previously normal) and severe TR  (previoulsly mild).   Conclusion(s)/Recommendation(s): There is severe biventricular failure  with severe  tricuspid regurgitation. No VSD visualized.    TEE/DCCV 06/27/20:   Procedure: TEE   Indication: Atrial flutter   Sedation: Per anesthesiology   Findings: Please see echo section for full report.  Mildly dilated LV with mild LV  hypertrophy.  EF 20-25%, diffuse hypokinesis.  Mildly dilated RV with moderately decreased systolic function.  Mild-moderate LA enlargement, moderate RA enlargement.  No LA appendage thrombus. No PFO/ASD by color doppler.  Moderate mitral regurgitation with restriction of the posterior leaflet.  Trivial TR.  Trileaflet aortic valve with no stenosis, trivial regurgitation.  Normal caliber thoracic aorta with grade 3 plaque.    No LA appendage thrombus, may proceed to DCCV.    Procedure Details Consent: Risks of procedure as well as the alternatives and risks of each were explained to the (patient/caregiver).  Consent for procedure obtained. Time Out: Verified patient identification, verified procedure, site/side was marked, verified correct patient position, special equipment/implants available, medications/allergies/relevent history reviewed, required imaging and test results available.  Performed   Patient placed on cardiac monitor, pulse oximetry, supplemental oxygen as necessary.  Sedation given: Propofol Pacer pads placed anterior and posterior chest.   Cardioverted 1 time(s).  Cardioverted at 150J.   Evaluation Findings: Post procedure EKG shows: NSR Complications: None Patient did tolerate procedure well.  History of Present Illness     Douglas Villa is a 70 y.o. male with a history of CAD, chronic systolic heart failure, HTN, COPD, OSA, and smoking (quit smoking in 2/19) with COPD.    In 04/2017 he was in [place] and was admitted for CHF with subsequent cath showing occluded PLV and 80% stenosis of the pLCx>>s/p DES/PCI. LVEF at that time was 25-35% with moderate-severe MR, moderate-severe TR. He saw Dr. Caryl Comes for ICD consideration, given frequent PVCs a holter was placed in 9/19 => 2% PVCs.  CPX was done in 9/19 showing moderate functional impairment from heart failure. Repeat echo in 1/20 showed EF 20-25% with severe LVH, mild MR, normal RV.   He was somewhat lost to follow up  since 2020 due to [place]. He saw his PCP two days prior to hospital presentation for SOB. He was sent to the ED and was found to be volume overloaded. Repeat echo showed an EF at 30%. He was started on IV Lasix. EKG showed anterior lead STE and he was transferred to [facility] for emergent LHC and shock team evaluation.   Hospital Course     [facility] 06/20/2020 showed distally occluded LAD with PCI placement. IABP was placed due to cardiogenic shock. Swan numbers suggestive of severe biventricular failure/shock with CPO 0.44 and PAPI 1.0 with CI 1.1. Stat echo with LVEF 15% RV severely decreased with severe TR and moderate to severe MR. No VSD. Presentation felt to be more of progressive HF/shock c/b acute shock from STEMI. He was started on Heparin infusion and placed on milrinone and norepinephrine along with IV Lasix. He was noted to be in new onset atrial fibrillation and was placed on IV amiodarone which was later transitioned to PO dosing.   He had issues with IABP and left IJ CVL oozing therefore Heparin was intermittently stopped. Site was eventually injected with SQ lidocaine with stitch placement per cardiology fellow. IABP was removed 06/21/20 and milrinone was slowly weaned to off due to hemodynamic improvement. He was started on DOAC with Eliquis for [facility] and plan was for TEE/DCCV given persistent AF that was performed 06/27/20 with 1 shock at 150J to NSR with no complication. TEE  with no atrial appendage thrombus. Given anterior MI and LVEF <35%, Dr. Aundra Dubin recommended pt be discharged home w/ LifeVest for primary prevention, however pt declined, citing he wore a LifeVest several years ago when he lived in [place] and could not tolerate wearing it. He was informed of indication and risk of being discharged w/o protection. He understands the risk and still declined LifeVest. Dr. Aundra Dubin notified. Milrinone was discontinued 06/27/20 with plans for discharge home today, 06/29/20.   Plan to continue ASA 81 + Plavix. Will  drop aspirin after 1 month as he is also on apixaban (new). ASA to STOP 07/27/2020. Amiodarone plan is for $Remo'200mg'JbvkA$  PO BID, Torsemide $RemoveBefore'40mg'ncWAftAyDvzCL$  PO QD and Kdur 24meq QD. HF regiment outlined below. Post hospital f/u in the [facility] has been arranged.    Hospital problems include:  CAD: H/o DES to LCX in 3/19. Admitted 06/20/20 with anterior STEMI found to have an occluded mLAD>>now s/p DES to mLAD. Also with known CTO PLV with collaterals and patent LCx stent. No s/s ischemia. Continue ASA 81, Plavix. Will drop aspirin after 1 month as he is also on apixaban. Continue atorvastatin 80. Refuses LifeVest. He was seen and ambulated with cardiac rehabilitation without anginal symptoms. Cath site/IABP site stable on day of d/c without s/s of bleeding or hemtoma. Hb stable at 10.4  CKD stage III: Creatinine seems to be around 2.0 at baseline. Creatinine down to 1.52 5/26>>stable today at 1.94 on day of d/c. Torsemide restarted at $RemoveBefo'40mg'TCfgMuDEPoL$  QD   Acute on chronic systolic CHF/cardiogenic shock: H/o mixed ischemic/nonischemic CMP (?HTN playing a role) with EF 20-25% with normal RV on prior echo. Echo this admission with EF at 20-25%, moderately decreased RV function with mild RVE, moderate biatrial enlargement, PASP 34, mild MR, mild TR, IVC normal.  IABP out 5/22. Milrinone stopped 5/27 Co-ox unchanged at 53%. JVP remains low. Start Torsemide $RemoveBeforeD'40mg'hVdBLOrUTmINvM$  QD along with Kdur 17meq QD. Continue hydralazine 25 mg tid and continue Imdur 30 mg daily. (dose dropped due to low BP). Digoxin 0.125 daily, continue. Dig level 0.5 on 5/26. Spironolactone 25 mg daily, dapagliflozin 10 mg daily, no b-blocker with low output   COPD: Prior smoker.   Type 2 diabetes: Restart PTA meds. HgbA1c 7.7  New Atrial flutter: Atypical, newly noted this admission, s/p TEE/DC-CV on 5/27. Remains in NSR. Continue apixaban. Started on IV amiodarone which was transitioned to PO dosing 06/28/20. Plan for amiodarone $RemoveBefore'200mg'wCYaondkUDxOh$  PO BID  Tricuspid regurgitation: Only mild on  repeat complete echo this admission    Hyponatremia: Sodium stable 134  Thrombocytopenia: resolved  Anemia: Hgb 10.4.  Has had feraheme.   Consultants: The patient has been seen and examined by Dr. Haroldine Laws who feels that he is stable and ready for discharge today, 06/29/20.   General: Well developed, well nourished, NAD Neck: Negative for carotid bruits. No JVD Lungs:Clear to ausculation bilaterally.  Cardiovascular: RRR with S1 S2. No murmurs Abdomen: Soft, non-tender, non-distended. No obvious abdominal masses. Extremities: No edema. Radial pulses 2+ bilaterally Neuro: Alert and oriented. No focal deficits. No facial asymmetry. MAE spontaneously. Psych: Responds to questions appropriately with normal affect.    Did the patient have an acute coronary syndrome (MI, NSTEMI, STEMI, etc) this admission?:  Yes                               AHA/ACC Clinical Performance & Quality Measures: Aspirin prescribed? - Yes ADP Receptor Inhibitor (Plavix/Clopidogrel, Brilinta/Ticagrelor or  Effient/Prasugrel) prescribed (includes medically managed patients)? - Yes Beta Blocker prescribed? - No - acute HF  High Intensity Statin (Lipitor 40-35m or Crestor 20-480m prescribed? - Yes EF assessed during THIS hospitalization? - Yes For EF <40%, was ACEI/ARB prescribed? - No - Reason:  CKD  For EF <40%, Aldosterone Antagonist (Spironolactone or Eplerenone) prescribed? - Yes Cardiac Rehab Phase II ordered (including medically managed patients)? - Yes  ___________  Discharge Vitals Blood pressure 130/82, pulse 80, temperature 97.8 F (36.6 C), temperature source Oral, resp. rate 18, height _0  (1.702 m), weight 77.9 kg, SpO2 100 %.  Filed Weights   06/27/20 1217 06/28/20 0613 06/29/20 0605  Weight: 78 kg 77.7 kg 77.9 kg   Labs & Radiologic Studies    CBC Recent Labs    06/28/20 0500 06/29/20 0453  WBC 6.5 5.6  HGB 10.7* 10.4*  HCT 32.6* 31.8*  MCV 82.5 83.9  PLT 185 19314 Basic  Metabolic Panel Recent Labs    06/28/20 0500 06/29/20 0453  NA 134* 134*  K 4.0 4.0  CL 101 103  CO2 26 24  GLUCOSE 96 87  BUN 20 20  CREATININE 1.96* 1.94*  CALCIUM 8.6* 8.3*   Liver Function Tests Recent Labs    06/28/20 0500 06/29/20 0453  AST 35 27  ALT 37 30  ALKPHOS 89 86  BILITOT 1.7* 1.5*  PROT 6.1* 5.9*  ALBUMIN 2.8* 2.7*   No results for input(s): LIPASE, AMYLASE in the last 72 hours. High Sensitivity Troponin:   Recent Labs  Lab 06/20/20 1335 06/20/20 1535  TROPONINIHS >27,000* >27,000*    BNP Invalid input(s): POCBNP D-Dimer No results for input(s): DDIMER in the last 72 hours. Hemoglobin A1C No results for input(s): HGBA1C in the last 72 hours. Fasting Lipid Panel No results for input(s): CHOL, HDL, LDLCALC, TRIG, CHOLHDL, LDLDIRECT in the last 72 hours. Thyroid Function Tests No results for input(s): TSH, T4TOTAL, T3FREE, THYROIDAB in the last 72 hours.  Invalid input(s): FREET3 _____________  CARDIAC CATHETERIZATION  Result Date: 06/20/2020  Ost LAD to Prox LAD lesion is 20% stenosed.  Mid LAD lesion is 20% stenosed.  Dist LAD lesion is 100% stenosed.  Ost RCA to Prox RCA lesion is 20% stenosed.  Previously placed 1st Mrg stent (unknown type) is widely patent.  3rd RPL lesion is 100% stenosed.  Acute anterolateral myocardial infarction secondary to mid-distal total occlusion of the LAD. The LAD had mild nonobstructive disease in the proximal to mid segment. Widely patent stent in the circumflex vessel. Mild luminal irregularity of the RCA with total occlusion of the PLA branch posteriorly with left to right collateralization. Successful coronary intervention with PTCA/DES stenting of the mid distal LAD with insertion of a 2.0 x 22 mm Resolute Onyx stent restoration of TIMI-3 flow.  There was initial spasm proximal  and distal stent following stent implantation. Cardiogenic shock with severe biventricular failure and EF estimate approximately 10%.   There is severe tricuspid regurgitation. [facility] 0.44 Pulmonary artery pulsatility index (PAPI): 1.0 Insertion of intra-aortic balloon pump. RECOMMENDATION: DAPT for minimum of 1 year.  The patient will continue with intra-aortic balloon pump support, milrinone and levophed presently.  He is now on intravenous amiodarone drip following bolus x2 for atrial flutter with variable block.  [facility]  Close follow-up of renal function will be necessary.   DG CHEST PORT 1 VIEW  Result Date: 06/21/2020 CLINICAL DATA:  PICC line placement.  Acute STEMI. EXAM: PORTABLE CHEST 1 VIEW COMPARISON:  06/20/2020 FINDINGS: New right arm PICC line is seen with tip overlying the superior cavoatrial junction. Left jugular central venous catheter and Swan-Ganz catheter remain in appropriate position. No evidence of pneumothorax. Mild cardiomegaly remains stable. Aortic atherosclerotic calcification noted. Both lungs are clear. IMPRESSION: New right arm PICC line tip overlies the superior cavoatrial junction. Stable cardiomegaly.  No active lung disease. Electronically Signed   By: Marlaine Hind M.D.   On: 06/21/2020 16:09   Port CXR  Result Date: 06/20/2020 CLINICAL DATA:  Check central line placement EXAM: PORTABLE CHEST 1 VIEW COMPARISON:  06/20/2020 FINDINGS: Cardiac shadow remains enlarged. Left jugular central line is noted with catheter tip in the proximal superior vena cava. No pneumothorax is noted. The lungs are clear. Intra-aortic balloon pump and Swan-Ganz catheter from an inferior approach are noted. IMPRESSION: No evidence of pneumothorax following left jugular central line placement. Tubes and lines as described above. Stable cardiomegaly. Electronically Signed   By: Inez Catalina M.D.   On: 06/20/2020 16:57   ECHOCARDIOGRAM COMPLETE  Result Date: 06/21/2020    ECHOCARDIOGRAM REPORT   Patient Name:   Douglas Villa Date of Exam: 06/21/2020 Medical  Rec #:  [id number]         Height:       65.0 in Accession #:    [id number]        Weight:       186.5 lb Date of Birth:  17-Sep-1950          BSA:          1.920 m Patient Age:    18 years          BP:           116/87 mmHg Patient Gender: M                 HR:           88 bpm. Exam Location:  Inpatient Procedure: 2D Echo Indications:    acute systolic chf  History:        Patient has prior history of Echocardiogram examinations, most                 recent 06/20/2020. CAD, balloon pump present, COPD and chronic                 kidney disease. Shock; Risk Factors:Sleep Apnea and Diabetes.  Sonographer:    Johny Chess Referring Phys: Luis Llorens Torres  1. Left ventricular ejection fraction, by estimation, is 20 to 25%. The left ventricle has severely decreased function. The left ventricle demonstrates regional wall motion abnormalities (see scoring diagram/findings for description). The left ventricular internal cavity size was mildly dilated. Left ventricular diastolic function could not be evaluated.  2. Right ventricular systolic function is moderately reduced. The right ventricular size is mildly enlarged. There is normal pulmonary artery systolic pressure. The estimated right ventricular systolic pressure is 62.5 mmHg.  3. Left atrial size was moderately dilated.  4. Right atrial size was moderately dilated.  5. The mitral valve is degenerative. Mild mitral valve regurgitation. No evidence of mitral stenosis.  6. The aortic valve is tricuspid. Aortic valve regurgitation is not visualized. Mild aortic valve sclerosis is present, with no evidence of aortic valve stenosis.  7. The inferior vena cava is normal in size with greater than 50% respiratory variability, suggesting right atrial pressure of 3 mmHg. Comparison(s): Changes from prior  study are noted. LVEF has improved up to 20-25%. Apical akinesis. RV function has improved. TR has improved. FINDINGS  Left Ventricle: Left ventricular  ejection fraction, by estimation, is 20 to 25%. The left ventricle has severely decreased function. The left ventricle demonstrates regional wall motion abnormalities. The left ventricular internal cavity size was mildly  dilated. There is no left ventricular hypertrophy. Left ventricular diastolic function could not be evaluated due to atrial fibrillation. Left ventricular diastolic function could not be evaluated.  LV Wall Scoring: The apical inferior segment and apex are akinetic. Right Ventricle: The right ventricular size is mildly enlarged. No increase in right ventricular wall thickness. Right ventricular systolic function is moderately reduced. There is normal pulmonary artery systolic pressure. The tricuspid regurgitant velocity is 2.80 m/s, and with an assumed right atrial pressure of 3 mmHg, the estimated right ventricular systolic pressure is 29.1 mmHg. Left Atrium: Left atrial size was moderately dilated. Right Atrium: Right atrial size was moderately dilated. Pericardium: Trivial pericardial effusion is present. Mitral Valve: The mitral valve is degenerative in appearance. Mild mitral valve regurgitation. No evidence of mitral valve stenosis. Tricuspid Valve: The tricuspid valve is grossly normal. Tricuspid valve regurgitation is mild . No evidence of tricuspid stenosis. Aortic Valve: The aortic valve is tricuspid. Aortic valve regurgitation is not visualized. Mild aortic valve sclerosis is present, with no evidence of aortic valve stenosis. Pulmonic Valve: The pulmonic valve was grossly normal. Pulmonic valve regurgitation is not visualized. No evidence of pulmonic stenosis. Aorta: The aortic root and ascending aorta are structurally normal, with no evidence of dilitation. Venous: The inferior vena cava is normal in size with greater than 50% respiratory variability, suggesting right atrial pressure of 3 mmHg. IAS/Shunts: The atrial septum is grossly normal. Additional Comments: A venous catheter is  visualized in the right atrium and right ventricle.  LEFT VENTRICLE PLAX 2D LVIDd:         6.10 cm LVIDs:         5.70 cm LV PW:         0.90 cm LV IVS:        0.90 cm LVOT diam:     1.90 cm LVOT Area:     2.84 cm  RIGHT VENTRICLE             IVC RV S prime:     11.50 cm/s  IVC diam: 1.20 cm LEFT ATRIUM              Index       RIGHT ATRIUM           Index LA diam:        4.70 cm  2.45 cm/m  RA Area:     25.60 cm LA Vol (A2C):   105.0 ml 54.68 ml/m RA Volume:   89.50 ml  46.61 ml/m LA Vol (A4C):   85.7 ml  44.63 ml/m LA Biplane Vol: 95.6 ml  49.78 ml/m   AORTA Ao Root diam: 3.40 cm Ao Asc diam:  3.40 cm TRICUSPID VALVE TR Peak grad:   31.4 mmHg TR Vmax:        280.00 cm/s  SHUNTS Systemic Diam: 1.90 cm Eleonore Chiquito MD Electronically signed by Eleonore Chiquito MD Signature Date/Time: 06/21/2020/1:20:49 PM    Final    ECHOCARDIOGRAM LIMITED  Result Date: 06/20/2020    ECHOCARDIOGRAM LIMITED REPORT   Patient Name:   Douglas Villa Date of Exam: 06/20/2020 Medical Rec #:  [id number]  Height:       65.0 in Accession #:    [id number]        Weight:       195.0 lb Date of Birth:  1950/09/24          BSA:          1.957 m Patient Age:    35 years          BP:           112/82 mmHg Patient Gender: M                 HR:           55 bpm. Exam Location:  Inpatient Procedure: Limited Color Doppler, Color Doppler and Cardiac Doppler Indications:    I23.2 VSD following acute MI  History:        Patient has prior history of Echocardiogram examinations, most                 recent 02/09/2018. CHF; Acute MI.  Sonographer:    Merrie Roof RDCS Referring Phys: [place] Comments: This was a limited echo performed in the cath lab for LV/RV function and rule out VSD and assess TR to determine next steps. IMPRESSIONS  1. Left ventricular ejection fraction, by estimation, is <20%. The left ventricle has severely decreased function. The left ventricle demonstrates global hypokinesis. There is the  interventricular septum is flattened in diastole ('D' shaped left ventricle), consistent with right ventricular volume overload. Ventricular septum appears intact with no evidence of shunting by color doppler interrogation.  2. Right ventricular systolic function is severely reduced. The right ventricular size is mildly enlarged. A Swanz-Ganz catheter is visualized.  3. The tricuspid valve structure appears normal. There is severe, unobstructive tricuspid regurgtiation that appears functional due to lack of central coaptation of TV leaflets.  4. Left atrial size was severely dilated.  5. Right atrial size was severely dilated.  6. The mitral valve is degenerative. Moderate mitral annular calcification. Comparison(s): Compared to prior TTE in 2020, the LVEF is now <20% (previously 20-25%) with RV failure (previously normal) and severe TR (previoulsly mild). Conclusion(s)/Recommendation(s): There is severe biventricular failure with severe tricuspid regurgitation. No VSD visualized. FINDINGS  Left Ventricle: Ventricular septum appears intact with no evidence of shunting by color doppler interrogation. Left ventricular ejection fraction, by estimation, is <20%. The left ventricle has severely decreased function. The left ventricle demonstrates global hypokinesis. The interventricular septum is flattened in diastole ('D' shaped left ventricle), consistent with right ventricular volume overload. Right Ventricle: The right ventricular size is mildly enlarged. Right ventricular systolic function is severely reduced. Left Atrium: Left atrial size was severely dilated. Right Atrium: Right atrial size was severely dilated. Mitral Valve: The mitral valve is degenerative in appearance. There is mild thickening of the mitral valve leaflet(s). There is mild calcification of the mitral valve leaflet(s). Moderate mitral annular calcification. Tricuspid Valve: The tricuspid valve is normal in structure. Tricuspid valve regurgitation  is severe. IAS/Shunts: No atrial level shunt detected by color flow Doppler. Additional Comments: A Swanz-Ganz catheter is visualized. Gwyndolyn Kaufman MD Electronically signed by Gwyndolyn Kaufman MD Signature Date/Time: 06/20/2020/2:41:51 PM    Final    [place] EKG SITE RITE  Result Date: 06/21/2020 If Site Rite image not attached, placement could not be confirmed due to current cardiac rhythm.  Disposition   Pt is being discharged home today in good condition.  Follow-up Plans & Appointments  Follow-up Information     [facility] Follow up.   Specialty: Cardiology Why: July 17, 2020 at 9:00 AM  [facility], [place] Code [phone] Contact information: [address] [id number] [place] [phone]               Discharge Instructions     Amb Referral to Cardiac Rehabilitation   Complete by: As directed    Will send Cardiac Rehab Phase 2 referral to [facility]   Diagnosis:  Coronary Stents NSTEMI     After initial evaluation and assessments completed: Virtual Based Care may be provided alone or in conjunction with Phase 2 Cardiac Rehab based on patient barriers.: Yes   Call MD for:  difficulty breathing, headache or visual disturbances   Complete by: As directed    Call MD for:  extreme fatigue   Complete by: As directed    Call MD for:  hives   Complete by: As directed    Call MD for:  persistant dizziness or light-headedness   Complete by: As directed    Call MD for:  persistant nausea and vomiting   Complete by: As directed    Call MD for:  redness, tenderness, or signs of infection (pain, swelling, redness, odor or green/yellow discharge around incision site)   Complete by: As directed    Call MD for:  severe uncontrolled pain   Complete by: As directed    Call MD for:  temperature >100.4   Complete by: As directed    Diet -  low sodium heart healthy   Complete by: As directed    Discharge instructions   Complete by: As directed    You will be taking three different blood thinning medications. Please watch out for bleeding and excessive bruising. You will be stopping the ASA after one month of therapy.   PLEASE DO NOT MISS ANY DOSES OF YOUR PLAVIX!!! Also keep a log of you blood pressures and bring back to your follow up appt. Please call the office with any questions.   Patients taking blood thinners should generally stay away from medicines like ibuprofen, Advil, Motrin, naproxen, and Aleve due to risk of stomach bleeding. You may take Tylenol as directed or talk to your primary doctor about alternatives.  Some studies suggest Prilosec/Omeprazole interacts with Plavix. If you have reflux symptoms, please use Protonix for less chance of interaction  PLEASE ENSURE THAT YOU DO NOT RUN OUT OF YOUR PLAVIX. IF you have issues obtaining this medication due to cost please CALL the office 3-5 business days prior to running out in order to prevent missing doses of this medication.   If the dressing is still on your incision site when you go home, remove it on the third day after your surgery date. Remove dressing if it begins to fall off, or if it is dirty or damaged before the third day.   Complete by: As directed    Increase activity slowly   Complete by: As directed       Discharge Medications   Allergies as of 06/29/2020       Reactions   Penicillins    Sweating        Medication List     STOP taking these medications    carvedilol 6.25 MG tablet Commonly known as: COREG       TAKE these medications  Accu-Chek Aviva Plus w/Device Kit by Does not apply route.   Accu-Chek FastClix Lancets Misc by Does not apply route.   albuterol 108 (90 Base) MCG/ACT inhaler Commonly known as: VENTOLIN HFA Inhale 2 puffs into the lungs every 6 (six) hours as needed for wheezing or shortness of breath.    amiodarone 200 MG tablet Commonly known as: PACERONE Take 1 tablet (200 mg total) by mouth 2 (two) times daily.   apixaban 5 MG Tabs tablet Commonly known as: ELIQUIS Take 1 tablet (5 mg total) by mouth 2 (two) times daily. Notes to patient: Will need to stop 07/27/20   aspirin 81 MG tablet Take 1 tablet (81 mg total) by mouth daily.   atorvastatin 80 MG tablet Commonly known as: LIPITOR Take 1 tablet (80 mg total) by mouth daily. Needs appt for future refills   clopidogrel 75 MG tablet Commonly known as: PLAVIX TAKE 1 TABLET BY MOUTH ONCE DAILY . APPOINTMENT REQUIRED FOR FUTURE REFILLS What changed: See the new instructions.   clopidogrel 75 MG tablet Commonly known as: Plavix Take 1 tablet (75 mg total) by mouth daily. What changed: You were already taking a medication with the same name, and this prescription was added. Make sure you understand how and when to take each.   clopidogrel 75 MG tablet Commonly known as: PLAVIX Take 1 tablet (75 mg total) by mouth daily. Start taking on: Jun 30, 2020 What changed: You were already taking a medication with the same name, and this prescription was added. Make sure you understand how and when to take each.   dapagliflozin propanediol 10 MG Tabs tablet Commonly known as: FARXIGA Take 1 tablet (10 mg total) by mouth daily. Start taking on: Jun 30, 2020   digoxin 0.125 MG tablet Commonly known as: LANOXIN Take 1 tablet (0.125 mg total) by mouth daily. Start taking on: Jun 30, 2020   glucose blood test strip 1 each by Other route as needed. Use as instructed   hydrALAZINE 25 MG tablet Commonly known as: APRESOLINE TAKE 1 & 1/2 (ONE & ONE-HALF) TABLETS BY MOUTH THREE TIMES DAILY. NEED AN APPOINTMENT FOR FUTURE REFILLS What changed: See the new instructions.   isosorbide mononitrate 30 MG 24 hr tablet Commonly known as: IMDUR Take 1 tablet (30 mg total) by mouth daily. Needs appt for future refills   nitroGLYCERIN 0.4 MG  SL tablet Commonly known as: NITROSTAT Place 0.4 mg under the tongue every 5 (five) minutes as needed for chest pain.   potassium chloride SA 20 MEQ tablet Commonly known as: KLOR-CON Take 1 tablet (20 mEq total) by mouth daily. What changed:  how much to take additional instructions   spironolactone 25 MG tablet Commonly known as: ALDACTONE Take 1 tablet by mouth once daily   Torsemide 40 MG Tabs Take 40 mg by mouth daily. What changed:  medication strength how much to take how to take this when to take this additional instructions   VISINE DRY EYE RELIEF OP Place 1 drop into both eyes daily as needed (For dry eyes).               Durable Medical Equipment  (From admission, onward)           Start     Ordered   06/27/20 1354  For home use only DME Vest life vest  Once       Comments: EF < 35%, Anterior MI  Duration 3 months   06/27/20 1353  Discharge Care Instructions  (From admission, onward)           Start     Ordered   06/29/20 0000  If the dressing is still on your incision site when you go home, remove it on the third day after your surgery date. Remove dressing if it begins to fall off, or if it is dirty or damaged before the third day.        06/29/20 1140            Outstanding Labs/Studies   BMET, CBC    Duration of Discharge Encounter   Greater than 30 minutes including physician time.  Signed, Kathyrn Drown, NP 06/29/2020, 1:39 PM  Patient seen and examined with the above-signed Advanced Practice Provider and/or Housestaff. I personally reviewed laboratory data, imaging studies and relevant notes. I independently examined the patient and formulated the important aspects of the plan. I have edited the note to reflect any of my changes or salient points. I have personally discussed the plan with the patient and/or family.  He is ready for d/c. See my rounding note from this am for further details. Follow-up in  [facility].   Glori Bickers, MD  6:26 PM

## 2020-06-29 NOTE — Progress Notes (Signed)
RN went to the main pharmacy to pick up patient's home meds. There is no plavix in the patient's home med bottle. Per AVS and discharge instructions, patient should take plavix daily and there is no prescription for plavix sent to [place] pending discharge. Primary RN and Agricultural consultant made aware. Paged MD to make aware. Awaiting new orders. Will continue to monitor.

## 2020-06-29 NOTE — Progress Notes (Signed)
Pt has been D/C today. Received referral to assist with meds. Pt has Medicare. Met with pt and informed him that I can't help him with his meds because he has Medicare. He reports that he only has Medicare part A. Discussed with pt the importance of having prescription coverage and signing up for Medicare part B and D. He reports that he has been able to afford his meds, but he is concern if they are too expensive. Encouraged pt to contact Medicare to discuss his options for prescription coverage. He verbalized understanding. Provided pt with a 30-day free card for Eliquis, GoodRx card, and a [facility] $4 list.

## 2020-06-29 NOTE — Progress Notes (Signed)
Unable to obtain CVP for 2000, 0000 and 0400 readings.  Arboriculturist

## 2020-06-29 NOTE — Plan of Care (Signed)
Problem: Education: Goal: Knowledge of General Education information will improve Description: Including pain rating scale, medication(s)/side effects and non-pharmacologic comfort measures 06/29/2020 1915 by Rolm Baptise, RN Outcome: Adequate for Discharge 06/29/2020 1902 by Rolm Baptise, RN Outcome: Adequate for Discharge 06/29/2020 1902 by Rolm Baptise, RN Outcome: Adequate for Discharge   Problem: Health Behavior/Discharge Planning: Goal: Ability to manage health-related needs will improve 06/29/2020 1915 by Rolm Baptise, RN Outcome: Adequate for Discharge 06/29/2020 1902 by Rolm Baptise, RN Outcome: Adequate for Discharge 06/29/2020 1902 by Rolm Baptise, RN Outcome: Adequate for Discharge   Problem: Clinical Measurements: Goal: Ability to maintain clinical measurements within normal limits will improve 06/29/2020 1915 by Rolm Baptise, RN Outcome: Adequate for Discharge 06/29/2020 1902 by Rolm Baptise, RN Outcome: Adequate for Discharge 06/29/2020 1902 by Rolm Baptise, RN Outcome: Adequate for Discharge Goal: Will remain free from infection 06/29/2020 1915 by Rolm Baptise, RN Outcome: Adequate for Discharge 06/29/2020 1902 by Rolm Baptise, RN Outcome: Adequate for Discharge 06/29/2020 1902 by Rolm Baptise, RN Outcome: Adequate for Discharge Goal: Diagnostic test results will improve 06/29/2020 1915 by Rolm Baptise, RN Outcome: Adequate for Discharge 06/29/2020 1902 by Rolm Baptise, RN Outcome: Adequate for Discharge 06/29/2020 1902 by Rolm Baptise, RN Outcome: Adequate for Discharge Goal: Respiratory complications will improve 06/29/2020 1915 by Rolm Baptise, RN Outcome: Adequate for Discharge 06/29/2020 1902 by Rolm Baptise, RN Outcome: Adequate for Discharge 06/29/2020 1902 by Rolm Baptise, RN Outcome: Adequate for Discharge Goal: Cardiovascular complication will be avoided 06/29/2020 1915 by Rolm Baptise, RN Outcome:  Adequate for Discharge 06/29/2020 1902 by Rolm Baptise, RN Outcome: Adequate for Discharge 06/29/2020 1902 by Rolm Baptise, RN Outcome: Adequate for Discharge   Problem: Activity: Goal: Risk for activity intolerance will decrease 06/29/2020 1915 by Rolm Baptise, RN Outcome: Adequate for Discharge 06/29/2020 1902 by Rolm Baptise, RN Outcome: Adequate for Discharge 06/29/2020 1902 by Rolm Baptise, RN Outcome: Adequate for Discharge   Problem: Nutrition: Goal: Adequate nutrition will be maintained 06/29/2020 1915 by Rolm Baptise, RN Outcome: Adequate for Discharge 06/29/2020 1902 by Rolm Baptise, RN Outcome: Adequate for Discharge 06/29/2020 1902 by Rolm Baptise, RN Outcome: Adequate for Discharge   Problem: Coping: Goal: Level of anxiety will decrease 06/29/2020 1915 by Rolm Baptise, RN Outcome: Adequate for Discharge 06/29/2020 1902 by Rolm Baptise, RN Outcome: Adequate for Discharge 06/29/2020 1902 by Rolm Baptise, RN Outcome: Adequate for Discharge   Problem: Elimination: Goal: Will not experience complications related to bowel motility 06/29/2020 1915 by Rolm Baptise, RN Outcome: Adequate for Discharge 06/29/2020 1902 by Rolm Baptise, RN Outcome: Adequate for Discharge 06/29/2020 1902 by Rolm Baptise, RN Outcome: Adequate for Discharge Goal: Will not experience complications related to urinary retention 06/29/2020 1915 by Rolm Baptise, RN Outcome: Adequate for Discharge 06/29/2020 1902 by Rolm Baptise, RN Outcome: Adequate for Discharge 06/29/2020 1902 by Rolm Baptise, RN Outcome: Adequate for Discharge   Problem: Pain Managment: Goal: General experience of comfort will improve 06/29/2020 1915 by Rolm Baptise, RN Outcome: Adequate for Discharge 06/29/2020 1902 by Rolm Baptise, RN Outcome: Adequate for Discharge 06/29/2020 1902 by Rolm Baptise, RN Outcome: Adequate for Discharge   Problem: Safety: Goal: Ability to  remain free from injury will improve 06/29/2020 1915 by Rolm Baptise, RN Outcome: Adequate for Discharge 06/29/2020 1902 by Rolm Baptise,  RN Outcome: Adequate for Discharge 06/29/2020 1902 by Rolm Baptise, RN Outcome: Adequate for Discharge   Problem: Skin Integrity: Goal: Risk for impaired skin integrity will decrease 06/29/2020 1915 by Rolm Baptise, RN Outcome: Adequate for Discharge 06/29/2020 1902 by Rolm Baptise, RN Outcome: Adequate for Discharge 06/29/2020 1902 by Rolm Baptise, RN Outcome: Adequate for Discharge   Problem: Education: Goal: Ability to demonstrate management of disease process will improve 06/29/2020 1915 by Rolm Baptise, RN Outcome: Adequate for Discharge 06/29/2020 1902 by Rolm Baptise, RN Outcome: Adequate for Discharge 06/29/2020 1902 by Rolm Baptise, RN Outcome: Adequate for Discharge Goal: Ability to verbalize understanding of medication therapies will improve 06/29/2020 1915 by Rolm Baptise, RN Outcome: Adequate for Discharge 06/29/2020 1902 by Rolm Baptise, RN Outcome: Adequate for Discharge 06/29/2020 1902 by Rolm Baptise, RN Outcome: Adequate for Discharge Goal: Individualized Educational Video(s) 06/29/2020 1915 by Rolm Baptise, RN Outcome: Adequate for Discharge 06/29/2020 1902 by Rolm Baptise, RN Outcome: Adequate for Discharge 06/29/2020 1902 by Rolm Baptise, RN Outcome: Adequate for Discharge   Problem: Activity: Goal: Capacity to carry out activities will improve 06/29/2020 1915 by Rolm Baptise, RN Outcome: Adequate for Discharge 06/29/2020 1902 by Rolm Baptise, RN Outcome: Adequate for Discharge 06/29/2020 1902 by Rolm Baptise, RN Outcome: Adequate for Discharge   Problem: Cardiac: Goal: Ability to achieve and maintain adequate cardiopulmonary perfusion will improve 06/29/2020 1915 by Rolm Baptise, RN Outcome: Adequate for Discharge 06/29/2020 1902 by Rolm Baptise, RN Outcome: Adequate  for Discharge 06/29/2020 1902 by Rolm Baptise, RN Outcome: Adequate for Discharge

## 2020-06-30 ENCOUNTER — Encounter (HOSPITAL_COMMUNITY): Payer: Self-pay | Admitting: Cardiology

## 2020-07-02 ENCOUNTER — Other Ambulatory Visit (HOSPITAL_COMMUNITY): Payer: Self-pay

## 2020-07-04 ENCOUNTER — Telehealth (HOSPITAL_COMMUNITY): Payer: Self-pay | Admitting: Pharmacy Technician

## 2020-07-04 ENCOUNTER — Telehealth: Payer: Self-pay | Admitting: *Deleted

## 2020-07-04 NOTE — Telephone Encounter (Addendum)
Sent in [employer] application via fax.  Will follow up.  Advanced Heart Failure Patient Advocate Encounter  Called [employer] to check the status of the patient's application. Representative stated that the patient needs to provide POI.

## 2020-07-04 NOTE — Chronic Care Management (AMB) (Signed)
  Care Management   Outreach Note  07/04/2020 Name: Douglas Villa MRN: [id number] DOB: [birth year]  Referred by: Lillard Anes, MD Reason for referral : Care Coordination (Outreach to schedule referral with RNCM was unsuccessful)   An unsuccessful telephone outreach was attempted today. The patient was referred to the case management team for assistance with care management and care coordination.   Follow Up Plan: A HIPAA compliant phone message was left for the patient providing contact information and requesting a return call. The care management team will reach out to the patient again over the next 7 days. If patient returns call to provider office, please advise to call [place] at [phone].  [facility]

## 2020-07-08 NOTE — Chronic Care Management (AMB) (Signed)
  Chronic Care Management   Outreach Note  07/08/2020 Name: [name] MRN: [id number] DOB: 1950-08-11  Douglas Villa is a 70 y.o. year old male who is a primary care patient of Lillard Anes, MD. I reached out to Douglas Villa by phone today in response to a referral sent by Mr. Gypsy Lore PCP, Dr. Henrene Pastor.     A second unsuccessful telephone outreach was attempted today. The patient was referred to the case management team for assistance with care management and care coordination.   Follow Up Plan: A HIPAA compliant phone message was left for the patient providing contact information and requesting a return call.  The care management team will reach out to the patient again over the next 2 days. If patient returns call to provider office, please advise to call [place] at [phone].  [facility]

## 2020-07-16 NOTE — Progress Notes (Signed)
ADVANCED HEART FAILURE CLINIC NOTE   Date:  07/17/2020   ID:  Douglas Villa, DOB Feb 23, 1950, MRN [id number]  Location: Home  Provider location: [place] [facility] Type of Visit: Established patient  PCP:  Douglas Anes, [place]  Cardiologist:  Dr. Aundra Villa  Chief Complaint: Exertional CHF follow up  History of Present Illness:  Patient has a history of CAD, cardiomyopathy, HTN, and smoking (quit smoking in 2/19) with COPD.  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  He had had one prior episode of CHF in 2016 where he was admitted at [facility] (no records available).  At the hospital in [place], he had LHC showed occluded PLV and 80% stenosis proximal LCx.  EF was 25-35% by LV-gram.  He had DES placed to pLCx.     Echo was done in 6/19.  This showed EF 25-30% with moderate-severe MR, moderate-severe TR. He saw Dr. Caryl Villa for ICD consideration, given frequent PVCs a holter was placed in 9/19 => 2% PVCs.  CPX was done in 9/19 showing moderate functional impairment from heart failure. Repeat echo in 1/20 showed EF 20-25% with severe LVH, mild MR, normal RV.    He continues to smoke 1 ppd.  He gets short of breath with heavy lifting/carrying as well as doing yardwork on a hot day, dyspnea mildly worse recently.  No dyspnea walking on flat ground.  No chest pain.  No claudication.  He has left 1st MTP joint pain, the joint is warm as well. No edema.  Weight is up about 6 lbs.  Mr Douglas Villa is a 70 year old with a history of CAD, chronic systolic heart failure, HTN, COPD, OSA, and smoking (quit smoking in 2/19) with COPD.     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  He had had one prior episode of CHF in 2016 where he was admitted at [facility] (no records available).  At the hospital in [place], he had LHC showed occluded PLV  and 80% stenosis proximal LCx.  EF was 25-35% by LV-gram.  He had DES placed to pLCx.     Echo was done in 6/19.  This showed EF 25-30% with moderate-severe MR, moderate-severe TR. He saw Dr. Caryl Villa for ICD consideration, given frequent PVCs a holter was placed in 9/19 => 2% PVCs.  CPX was done in 9/19 showing moderate functional impairment from heart failure. Repeat echo in 1/20 showed EF 20-25% with severe LVH, mild MR, normal RV.    He had been followed by Dr Douglas Villa and had a virtual visit back to 2020. He has not been seen since that time because he was concerned about COVID. He has been of meds for some time.  He was sent to the ED from PCP office 5/22 for volume overload. Echo showed an EF at 30%. Started on IV Lasix. EKG showed anterior lead STE and he was transferred to [facility] for emergent LHC and shock team evaluation.   LHC 06/20/2020 showed distally occluded LAD with PCI placement. IABP was placed due to cardiogenic shock. Swan numbers suggestive of severe biventricular failure/shock Echo with LVEF 15% RV severely decreased with severe TR and moderate to severe MR. No VSD. Placed on milrinone, NE, IV Lasix. He was noted to be in new onset atrial fibrillation. TEE/DCCV performed 06/27/20 to NSR  with no complication.   Given anterior MI and LVEF <35%, Dr. Aundra Villa recommended pt be discharged home w/ LifeVest for primary prevention, however pt declined, citing he wore a LifeVest several years ago when he lived in [place] and could not tolerate wearing it. He was informed of indication and risk of being discharged w/o protection. He understood the risk and still declined LifeVest. He was discharged on GDMT, weight 171 lbs.  Today he returns for post hospital HF follow up. Feeling terrible, extremely fatigued. 11 steps from bed to bathroom, and he has to take a break to walk that. Living in hotel since discharge. Having some dizziness. Has friend who brings him food. Too weak to cook. Denies increasing SOB,  CP, edema, or PND/Orthopnea. Appetite poor, vomited today, constipated. No fever or chills. Not checking weight at home, has scale. Has not taken [place] or Imdur, too expensive. Patient's ex-wife says he is not doing well. Refusing LifeVest, has had it in past and said it was  uncomfortable.   ECG (personally reviewed): 94 bpm, qtc  457 ms.  Cardiac Studies: Echo (5/22) in cath lab with severe biventricular heart failure. EF < 20%.    Cath 5/22 Occluded distal LAD-->PCI.   IABP placed  Cath  RA 29 PWP 28 CO 2.2 CI 1.15 CPO .48 PAPi 1.0  Ost LAD to Prox LAD lesion is 20% stenosed. Mid LAD lesion is 20% stenosed. Dist LAD lesion is 100% stenosed. Ost RCA to Prox RCA lesion is 20% stenosed. Previously placed 1st Mrg stent (unknown type) is widely patent.     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  Labs (9/19): K 4.5, creatinine 2.5 Labs (10/19): K 3.7, creatinine 1.92 Labs (1/20): K 3.7, creatinine 2.35 Labs (5/22): K 4.0, creatinine 1.94   PMH: 1. HTN 2. CKD stage 3 3. COPD: Still smoking.   4. OSA 5. Hyperlipidemia 6. 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   - LHC (3/19): Done in [place].  Totally occluded PLV, 80% pLCx stenosis => DES to LCx.  LV-gram with EF 25-35%. - Echo (6/19): EF 25-30%, grade III diastolic dysfunction, moderate-severe MR, moderate-severe TR, PASP 57 mmHg.  - CPX (9/19): with peak VO2 17.8, VE/VCO2 slope 38, RER 1.11 => moderate functional impairment.  - Echo (1/20): EF 20-25%, severe LVH, mild MR, normal RV size and systolic function, PASP 33 mmHg.  - Echo (5/22): EF <20%, severe biV failure 7. CAD: LHC (3/19): Done in [place].  Totally occluded PLV, 80% pLCx stenosis => DES to LCx.  8. PVCs: Holter 9/19 with 2% PVCs.  9. Gout  Current Outpatient Medications  Medication Sig Dispense Refill    ACCU-CHEK FASTCLIX LANCETS MISC by Does not apply route.     albuterol (PROVENTIL HFA;VENTOLIN HFA) 108 (90 Base) MCG/ACT inhaler Inhale 2 puffs into the lungs every 6 (six) hours as needed for wheezing or shortness of breath. 1 Inhaler 2   amiodarone (PACERONE) 200 MG tablet Take 1 tablet (200 mg total) by mouth 2 (two) times daily. 120 tablet 2   apixaban (ELIQUIS) 5 MG TABS tablet Take 1 tablet (5 mg total) by mouth 2 (two) times daily. 120 tablet 2   Blood Glucose Monitoring Suppl (ACCU-CHEK AVIVA PLUS) w/Device KIT by Does not apply route.     clopidogrel (PLAVIX) 75 MG tablet TAKE 1 TABLET BY MOUTH ONCE DAILY . APPOINTMENT REQUIRED FOR FUTURE  REFILLS (Patient taking differently: Take 75 mg by mouth daily.) 30 tablet 0   digoxin (LANOXIN) 0.125 MG tablet Take 1 tablet (0.125 mg total) by mouth daily. 60 tablet 2   glucose blood test strip 1 each by Other route as needed. Use as instructed     hydrALAZINE (APRESOLINE) 25 MG tablet TAKE 1 & 1/2 (ONE & ONE-HALF) TABLETS BY MOUTH THREE TIMES DAILY. NEED AN APPOINTMENT FOR FUTURE REFILLS (Patient taking differently: Take 37.5 mg by mouth 3 (three) times daily.) 135 tablet 0   nitroGLYCERIN (NITROSTAT) 0.4 MG SL tablet Place 0.4 mg under the tongue every 5 (five) minutes as needed for chest pain.     Polyethylene Glycol 400 (VISINE DRY EYE RELIEF OP) Place 1 drop into both eyes daily as needed (For dry eyes).     potassium chloride SA (KLOR-CON) 20 MEQ tablet Take 1 tablet (20 mEq total) by mouth daily. 60 tablet 2   spironolactone (ALDACTONE) 25 MG tablet Take 1 tablet by mouth once daily (Patient taking differently: Take 25 mg by mouth daily.) 90 tablet 0   torsemide 40 MG TABS Take 40 mg by mouth daily. 60 tablet 2   aspirin 81 MG tablet Take 1 tablet (81 mg total) by mouth daily. (Patient not taking: Reported on 07/17/2020) 90 tablet 3   atorvastatin (LIPITOR) 80 MG tablet Take 1 tablet (80 mg total) by mouth daily. Needs appt for future refills  (Patient not taking: Reported on 07/17/2020) 30 tablet 0   dapagliflozin propanediol (FARXIGA) 10 MG TABS tablet Take 1 tablet (10 mg total) by mouth daily. (Patient not taking: Reported on 07/17/2020) 60 tablet 2   isosorbide mononitrate (IMDUR) 30 MG 24 hr tablet Take 1 tablet (30 mg total) by mouth daily. Needs appt for future refills (Patient not taking: Reported on 07/17/2020) 30 tablet 0   No current facility-administered medications for this encounter.    Allergies:   Penicillins   Social History:  The patient  reports that he quit smoking about 2 years ago. His smoking use included cigarettes. He has a 45.00 pack-year smoking history. He has never used smokeless tobacco. He reports that he does not drink alcohol and does not use drugs.   Family History:  The patient's family history includes Cancer (age of onset: 23) in his mother; Stroke (age of onset: 24) in his father.   ROS:  Please see the history of present illness.   All other systems are personally reviewed and negative.   Recent Labs: 06/24/2020: Magnesium 1.9 06/29/2020: ALT 30; BUN 20; Creatinine, Ser 1.94; Hemoglobin 10.4; Platelets 198; Potassium 4.0; Sodium 134  Personally reviewed   Wt Readings from Last 3 Encounters:  07/17/20 76 kg  06/29/20 77.9 kg  06/18/20 88.5 kg    Physical Exam: General:  Frail appearing, cachectic, unable to walk into clinic and had to be wheeled in on stretcher. No resp difficulty. HEENT: Normal Neck: Supple. No JVD. Carotids 2+ bilat; no bruits. No lymphadenopathy or thryomegaly appreciated. Cor: PMI nondisplaced. Regular rate & rhythm. No rubs, gallops or murmurs. Lungs: Clear, diminished in bases. Abdomen: Soft, nontender, nondistended. No hepatosplenomegaly. No bruits or masses. Good bowel sounds. Extremities: No cyanosis, clubbing, rash, edema. Observed small brown bugs crawling on patient and in bed linen. Neuro: Alert & oriented x 3, cranial nerves grossly intact. Moves all 4  extremities w/o difficulty. Affect pleasant.  ASSESSMENT AND PLAN: 1. CAD: H/o DES to LCX in 3/19, had occluded PLV at that time.  Admitted with anterior STEMI and occluded mLAD, had DES to mLAD, known CTO PLV with collaterals and patent LCx stent.  No s/s ischemia - Continue ASA 81, Plavix. Will drop aspirin after 07/27/20, as he is also on apixaban. - Continue atorvastatin 80 mg daily. - Refuses LifeVest. 2. CKD: Stage 3, creatinine seems to be around 2 at baseline.    - BMET today. - Stop torsemide as he appears dry today. 3. Chronic systolic CHF/cardiogenic shock: H/o mixed ischemic/nonischemic CMP (?HTN playing a role) with EF 20-25% with normal RV on prior echo.  Echo this admission EF 20-25%, moderately decreased RV function with mild RVE, moderate biatrial enlargement, PASP 34, mild MR, mild TR, IVC normal.   NYHA IIIb-IV, appears dry today, extremely weak and unable to stand. - Stop torsemide, spiro, digoxin.  - Has not been taking hydralazine 25 mg tid and Imdur 30 mg due to cost. (dose dropped due to low BP). Anticipate adding these back after volume corrected. - He has not taken [place] as it is too expensive. - No b-blocker with low output. - BMET today. 4. COPD: Prior smoker. 5. Type 2 diabetes: Not taking [place].  6. Atrial flutter: Atypical, newly noted this past admission.   - s/p TEE/DC-CV on 5/27. Remains in NSR today. - Continue apixaban. Endorses some BRBPR, likely hemorrhoids. Check CBC - Continue amio 200 mg bid. He will need regular eye exams. 7. Tricuspid regurgitation: Only mild on repeat complete echo.   8. Orthostasis: Likely 2/2 volume depletion, not eating and drinking regularly at home. BP dropped 30 mmHg from lying to sitting here in clinic. - Needs volume. - Stop spiro, digoxin, Farxiga, and torsemide as above. 9. FTT: Living in a hotel, has bed bug infestation, no transportation and food scarcity. - Lives in [place], not able to receive paramedicine  services. - Will reach out to [facility]. - His ex-wife gave him a ride today but is not in a position to be his caretaker.  - Unable to afford meds and no Medicare Part D, so he needs patient assitance for HF medications. Pharmacy working on assistance forms.  Patient too weak to stand, is orthostatic and has visible bed-bugs on him. He appears malnourished and I worry about failure to thrive. He does not appear to be struggling from a HF standpoint today. Likely needs volume resuscitation, labs and to resume HF medications when stabilized. He is agreeable to be seen in the ED. We will see him back in HF clinic when medical issues addressed.  Frankey Poot, FNP  07/17/2020 10:56 AM  [place] [address] [facility] [place] [address] [phone] (office) [phone] (fax)

## 2020-07-17 ENCOUNTER — Inpatient Hospital Stay (HOSPITAL_COMMUNITY): Payer: Medicare Other

## 2020-07-17 ENCOUNTER — Other Ambulatory Visit: Payer: Self-pay

## 2020-07-17 ENCOUNTER — Emergency Department (HOSPITAL_COMMUNITY): Payer: Medicare Other

## 2020-07-17 ENCOUNTER — Inpatient Hospital Stay (HOSPITAL_COMMUNITY)
Admission: EM | Admit: 2020-07-17 | Discharge: 2020-07-23 | DRG: 682 | Disposition: A | Discharge status: Skilled Nursing Facility | Payer: Medicare Other | Attending: Internal Medicine | Admitting: Internal Medicine

## 2020-07-17 ENCOUNTER — Ambulatory Visit (HOSPITAL_COMMUNITY)
Admission: RE | Admit: 2020-07-17 | Discharge: 2020-07-17 | Disposition: A | Discharge status: Home / Self Care | Payer: Self-pay | Source: Ambulatory Visit | Attending: Family Medicine | Admitting: Family Medicine

## 2020-07-17 ENCOUNTER — Encounter (HOSPITAL_COMMUNITY): Payer: Self-pay

## 2020-07-17 VITALS — BP 129/97 | HR 95

## 2020-07-17 DIAGNOSIS — I2510 Atherosclerotic heart disease of native coronary artery without angina pectoris: Secondary | ICD-10-CM | POA: Insufficient documentation

## 2020-07-17 DIAGNOSIS — I130 Hypertensive heart and chronic kidney disease with heart failure and stage 1 through stage 4 chronic kidney disease, or unspecified chronic kidney disease: Secondary | ICD-10-CM | POA: Insufficient documentation

## 2020-07-17 DIAGNOSIS — I4891 Unspecified atrial fibrillation: Secondary | ICD-10-CM | POA: Insufficient documentation

## 2020-07-17 DIAGNOSIS — Z79899 Other long term (current) drug therapy: Secondary | ICD-10-CM | POA: Insufficient documentation

## 2020-07-17 DIAGNOSIS — T502X5A Adverse effect of carbonic-anhydrase inhibitors, benzothiadiazides and other diuretics, initial encounter: Secondary | ICD-10-CM | POA: Diagnosis present

## 2020-07-17 DIAGNOSIS — E860 Dehydration: Secondary | ICD-10-CM | POA: Diagnosis present

## 2020-07-17 DIAGNOSIS — Z7902 Long term (current) use of antithrombotics/antiplatelets: Secondary | ICD-10-CM

## 2020-07-17 DIAGNOSIS — R42 Dizziness and giddiness: Secondary | ICD-10-CM | POA: Insufficient documentation

## 2020-07-17 DIAGNOSIS — I2102 ST elevation (STEMI) myocardial infarction involving left anterior descending coronary artery: Secondary | ICD-10-CM | POA: Diagnosis present

## 2020-07-17 DIAGNOSIS — Z7982 Long term (current) use of aspirin: Secondary | ICD-10-CM | POA: Insufficient documentation

## 2020-07-17 DIAGNOSIS — I4892 Unspecified atrial flutter: Secondary | ICD-10-CM | POA: Diagnosis present

## 2020-07-17 DIAGNOSIS — Z880 Allergy status to penicillin: Secondary | ICD-10-CM | POA: Insufficient documentation

## 2020-07-17 DIAGNOSIS — E875 Hyperkalemia: Secondary | ICD-10-CM | POA: Diagnosis present

## 2020-07-17 DIAGNOSIS — I5042 Chronic combined systolic (congestive) and diastolic (congestive) heart failure: Secondary | ICD-10-CM | POA: Diagnosis present

## 2020-07-17 DIAGNOSIS — N170 Acute kidney failure with tubular necrosis: Secondary | ICD-10-CM | POA: Diagnosis present

## 2020-07-17 DIAGNOSIS — Z20822 Contact with and (suspected) exposure to covid-19: Secondary | ICD-10-CM | POA: Diagnosis present

## 2020-07-17 DIAGNOSIS — E1122 Type 2 diabetes mellitus with diabetic chronic kidney disease: Secondary | ICD-10-CM | POA: Insufficient documentation

## 2020-07-17 DIAGNOSIS — N183 Chronic kidney disease, stage 3 unspecified: Secondary | ICD-10-CM | POA: Insufficient documentation

## 2020-07-17 DIAGNOSIS — J449 Chronic obstructive pulmonary disease, unspecified: Secondary | ICD-10-CM | POA: Diagnosis present

## 2020-07-17 DIAGNOSIS — N179 Acute kidney failure, unspecified: Secondary | ICD-10-CM

## 2020-07-17 DIAGNOSIS — I255 Ischemic cardiomyopathy: Secondary | ICD-10-CM | POA: Diagnosis present

## 2020-07-17 DIAGNOSIS — I5022 Chronic systolic (congestive) heart failure: Secondary | ICD-10-CM | POA: Insufficient documentation

## 2020-07-17 DIAGNOSIS — E46 Unspecified protein-calorie malnutrition: Secondary | ICD-10-CM | POA: Insufficient documentation

## 2020-07-17 DIAGNOSIS — I071 Rheumatic tricuspid insufficiency: Secondary | ICD-10-CM

## 2020-07-17 DIAGNOSIS — I484 Atypical atrial flutter: Secondary | ICD-10-CM | POA: Insufficient documentation

## 2020-07-17 DIAGNOSIS — Z955 Presence of coronary angioplasty implant and graft: Secondary | ICD-10-CM

## 2020-07-17 DIAGNOSIS — I5082 Biventricular heart failure: Secondary | ICD-10-CM | POA: Diagnosis present

## 2020-07-17 DIAGNOSIS — Z809 Family history of malignant neoplasm, unspecified: Secondary | ICD-10-CM

## 2020-07-17 DIAGNOSIS — T460X5A Adverse effect of cardiac-stimulant glycosides and drugs of similar action, initial encounter: Secondary | ICD-10-CM | POA: Diagnosis present

## 2020-07-17 DIAGNOSIS — Z87891 Personal history of nicotine dependence: Secondary | ICD-10-CM | POA: Diagnosis not present

## 2020-07-17 DIAGNOSIS — Z7984 Long term (current) use of oral hypoglycemic drugs: Secondary | ICD-10-CM | POA: Insufficient documentation

## 2020-07-17 DIAGNOSIS — E785 Hyperlipidemia, unspecified: Secondary | ICD-10-CM | POA: Diagnosis present

## 2020-07-17 DIAGNOSIS — R627 Adult failure to thrive: Secondary | ICD-10-CM

## 2020-07-17 DIAGNOSIS — N184 Chronic kidney disease, stage 4 (severe): Secondary | ICD-10-CM | POA: Diagnosis present

## 2020-07-17 DIAGNOSIS — G4733 Obstructive sleep apnea (adult) (pediatric): Secondary | ICD-10-CM | POA: Insufficient documentation

## 2020-07-17 DIAGNOSIS — E861 Hypovolemia: Secondary | ICD-10-CM | POA: Diagnosis present

## 2020-07-17 DIAGNOSIS — Z7901 Long term (current) use of anticoagulants: Secondary | ICD-10-CM | POA: Insufficient documentation

## 2020-07-17 DIAGNOSIS — R531 Weakness: Secondary | ICD-10-CM | POA: Diagnosis present

## 2020-07-17 DIAGNOSIS — I480 Paroxysmal atrial fibrillation: Secondary | ICD-10-CM | POA: Diagnosis present

## 2020-07-17 DIAGNOSIS — Z6826 Body mass index (BMI) 26.0-26.9, adult: Secondary | ICD-10-CM

## 2020-07-17 DIAGNOSIS — I951 Orthostatic hypotension: Secondary | ICD-10-CM | POA: Diagnosis present

## 2020-07-17 DIAGNOSIS — Z823 Family history of stroke: Secondary | ICD-10-CM | POA: Insufficient documentation

## 2020-07-17 DIAGNOSIS — M109 Gout, unspecified: Secondary | ICD-10-CM | POA: Diagnosis not present

## 2020-07-17 DIAGNOSIS — F17210 Nicotine dependence, cigarettes, uncomplicated: Secondary | ICD-10-CM | POA: Insufficient documentation

## 2020-07-17 DIAGNOSIS — E1121 Type 2 diabetes mellitus with diabetic nephropathy: Secondary | ICD-10-CM

## 2020-07-17 LAB — BASIC METABOLIC PANEL
Anion gap: 14 (ref 5–15)
Anion gap: 13 (ref 5–15)
BUN: 146 mg/dL — ABNORMAL HIGH (ref 8–23)
BUN: 143 mg/dL — ABNORMAL HIGH (ref 8–23)
CO2: 23 mmol/L (ref 22–32)
CO2: 25 mmol/L (ref 22–32)
Calcium: 9.9 mg/dL (ref 8.9–10.3)
Calcium: 9.1 mg/dL (ref 8.9–10.3)
Chloride: 95 mmol/L — ABNORMAL LOW (ref 98–111)
Chloride: 95 mmol/L — ABNORMAL LOW (ref 98–111)
Creatinine, Ser: 5.47 mg/dL — ABNORMAL HIGH (ref 0.61–1.24)
Creatinine, Ser: 5 mg/dL — ABNORMAL HIGH (ref 0.61–1.24)
GFR, Estimated: 10 mL/min — ABNORMAL LOW (ref >60)
GFR, Estimated: 12 mL/min — ABNORMAL LOW (ref >60)
Glucose, Bld: 166 mg/dL — ABNORMAL HIGH (ref 70–99)
Glucose, Bld: 220 mg/dL — ABNORMAL HIGH (ref 70–99)
Potassium: 6 mmol/L — ABNORMAL HIGH (ref 3.5–5.1)
Potassium: 5.3 mmol/L — ABNORMAL HIGH (ref 3.5–5.1)
Sodium: 132 mmol/L — ABNORMAL LOW (ref 135–145)
Sodium: 133 mmol/L — ABNORMAL LOW (ref 135–145)

## 2020-07-17 LAB — CBC WITH DIFFERENTIAL/PLATELET
Abs Immature Granulocytes: 0.04 10*3/uL (ref 0.00–0.07)
Basophils Absolute: 0 10*3/uL (ref 0.0–0.1)
Basophils Relative: 0 %
Eosinophils Absolute: 0 10*3/uL (ref 0.0–0.5)
Eosinophils Relative: 0 %
HCT: 47.8 % (ref 39.0–52.0)
Hemoglobin: 15.7 g/dL (ref 13.0–17.0)
Immature Granulocytes: 0 %
Lymphocytes Relative: 6 %
Lymphs Abs: 0.6 10*3/uL — ABNORMAL LOW (ref 0.7–4.0)
MCH: 28.1 pg (ref 26.0–34.0)
MCHC: 32.8 g/dL (ref 30.0–36.0)
MCV: 85.7 fL (ref 80.0–100.0)
Monocytes Absolute: 0.5 10*3/uL (ref 0.1–1.0)
Monocytes Relative: 5 %
Neutro Abs: 8.8 10*3/uL — ABNORMAL HIGH (ref 1.7–7.7)
Neutrophils Relative %: 89 %
Platelets: 303 10*3/uL (ref 150–400)
RBC: 5.58 MIL/uL (ref 4.22–5.81)
RDW: 19.8 % — ABNORMAL HIGH (ref 11.5–15.5)
WBC: 9.9 10*3/uL (ref 4.0–10.5)
nRBC: 0 % (ref 0.0–0.2)

## 2020-07-17 LAB — RESP PANEL BY RT-PCR (FLU A&B, COVID) ARPGX2
Influenza A by PCR: NEGATIVE
Influenza B by PCR: NEGATIVE
SARS Coronavirus 2 by RT PCR: NEGATIVE

## 2020-07-17 LAB — DIGOXIN LEVEL: Digoxin Level: 2.5 ng/mL — ABNORMAL HIGH (ref 0.8–2.0)

## 2020-07-17 LAB — ECHO TEE: Radius: 0.8 cm

## 2020-07-17 LAB — GLUCOSE, CAPILLARY: Glucose-Capillary: 182 mg/dL — ABNORMAL HIGH (ref 70–99)

## 2020-07-17 LAB — CBG MONITORING, ED: Glucose-Capillary: 80 mg/dL (ref 70–99)

## 2020-07-17 LAB — BRAIN NATRIURETIC PEPTIDE: B Natriuretic Peptide: 391.9 pg/mL — ABNORMAL HIGH (ref 0.0–100.0)

## 2020-07-17 MED ORDER — POLYVINYL ALCOHOL 1.4 % OP SOLN
1.0000 [drp] | Freq: Every day | OPHTHALMIC | Status: DC | PRN
  Filled 2020-07-17: qty 15

## 2020-07-17 MED ORDER — ONDANSETRON HCL 4 MG/2ML IJ SOLN
4.0000 mg | Freq: Four times a day (QID) | INTRAMUSCULAR | Status: DC | PRN
  Administered 2020-07-21: 4 mg via INTRAVENOUS
  Filled 2020-07-17: qty 2

## 2020-07-17 MED ORDER — ATORVASTATIN CALCIUM 80 MG PO TABS
80.0000 mg | ORAL_TABLET | Freq: Every day | ORAL | Status: DC
  Administered 2020-07-18 – 2020-07-23 (×6): 80 mg via ORAL
  Filled 2020-07-17 (×6): qty 1

## 2020-07-17 MED ORDER — DEXTROSE 50 % IV SOLN
50.0000 mL | Freq: Once | INTRAVENOUS | Status: AC
  Administered 2020-07-17: 50 mL via INTRAVENOUS
  Filled 2020-07-17: qty 50

## 2020-07-17 MED ORDER — SODIUM ZIRCONIUM CYCLOSILICATE 10 G PO PACK
10.0000 g | PACK | Freq: Once | ORAL | Status: AC
  Administered 2020-07-17: 10 g via ORAL
  Filled 2020-07-17: qty 1

## 2020-07-17 MED ORDER — CLOPIDOGREL BISULFATE 75 MG PO TABS
75.0000 mg | ORAL_TABLET | Freq: Every day | ORAL | Status: DC
  Administered 2020-07-18 – 2020-07-23 (×6): 75 mg via ORAL
  Filled 2020-07-17 (×6): qty 1

## 2020-07-17 MED ORDER — SODIUM CHLORIDE 0.9% FLUSH: 3.0000 mL | INTRAVENOUS | Status: DC | PRN

## 2020-07-17 MED ORDER — SODIUM BICARBONATE 8.4 % IV SOLN
50.0000 meq | Freq: Once | INTRAVENOUS | Status: AC
  Administered 2020-07-17: 50 meq via INTRAVENOUS
  Filled 2020-07-17: qty 50

## 2020-07-17 MED ORDER — ALBUTEROL SULFATE (2.5 MG/3ML) 0.083% IN NEBU: 2.5000 mg | INHALATION_SOLUTION | Freq: Four times a day (QID) | RESPIRATORY_TRACT | Status: DC | PRN

## 2020-07-17 MED ORDER — INSULIN ASPART 100 UNIT/ML IJ SOLN
0.0000 [IU] | Freq: Three times a day (TID) | INTRAMUSCULAR | Status: DC
  Administered 2020-07-18: 1 [IU] via SUBCUTANEOUS
  Administered 2020-07-18 – 2020-07-20 (×2): 2 [IU] via SUBCUTANEOUS
  Administered 2020-07-21: 1 [IU] via SUBCUTANEOUS
  Administered 2020-07-21 – 2020-07-22 (×2): 2 [IU] via SUBCUTANEOUS
  Administered 2020-07-22: 3 [IU] via SUBCUTANEOUS
  Administered 2020-07-23: 1 [IU] via SUBCUTANEOUS

## 2020-07-17 MED ORDER — SODIUM CHLORIDE 0.9 % IV SOLN: 250.0000 mL | INTRAVENOUS | Status: DC | PRN

## 2020-07-17 MED ORDER — SODIUM CHLORIDE 0.9 % IV BOLUS
250.0000 mL | Freq: Once | INTRAVENOUS | Status: AC
  Administered 2020-07-17: 250 mL via INTRAVENOUS

## 2020-07-17 MED ORDER — CALCIUM GLUCONATE 10 % IV SOLN
1.0000 g | Freq: Once | INTRAVENOUS | Status: AC
  Administered 2020-07-17: 1 g via INTRAVENOUS
  Filled 2020-07-17: qty 10

## 2020-07-17 MED ORDER — ONDANSETRON HCL 4 MG/2ML IJ SOLN
4.0000 mg | Freq: Once | INTRAMUSCULAR | Status: AC
  Administered 2020-07-17: 4 mg via INTRAVENOUS
  Filled 2020-07-17: qty 2

## 2020-07-17 MED ORDER — SODIUM CHLORIDE 0.9 % IV SOLN: INTRAVENOUS | Status: DC

## 2020-07-17 MED ORDER — ACETAMINOPHEN 325 MG PO TABS
650.0000 mg | ORAL_TABLET | ORAL | Status: DC | PRN
  Administered 2020-07-20: 650 mg via ORAL
  Filled 2020-07-17: qty 2

## 2020-07-17 MED ORDER — DAPAGLIFLOZIN PROPANEDIOL 10 MG PO TABS: 10.0000 mg | ORAL_TABLET | Freq: Every day | ORAL | Status: DC

## 2020-07-17 MED ORDER — HYDRALAZINE HCL 25 MG PO TABS: 25.0000 mg | ORAL_TABLET | Freq: Four times a day (QID) | ORAL | Status: DC | PRN

## 2020-07-17 MED ORDER — SODIUM CHLORIDE 0.9% FLUSH
3.0000 mL | Freq: Two times a day (BID) | INTRAVENOUS | Status: DC
  Administered 2020-07-18 – 2020-07-23 (×7): 3 mL via INTRAVENOUS

## 2020-07-17 MED ORDER — INSULIN ASPART 100 UNIT/ML IJ SOLN
10.0000 [IU] | Freq: Once | INTRAMUSCULAR | Status: AC
  Administered 2020-07-17: 10 [IU] via INTRAVENOUS

## 2020-07-17 MED ORDER — APIXABAN 5 MG PO TABS
5.0000 mg | ORAL_TABLET | Freq: Two times a day (BID) | ORAL | Status: DC
  Administered 2020-07-17 – 2020-07-23 (×12): 5 mg via ORAL
  Filled 2020-07-17 (×12): qty 1

## 2020-07-17 MED ORDER — ASPIRIN EC 81 MG PO TBEC
81.0000 mg | DELAYED_RELEASE_TABLET | Freq: Every day | ORAL | Status: AC
  Administered 2020-07-18 – 2020-07-21 (×4): 81 mg via ORAL
  Filled 2020-07-17 (×4): qty 1

## 2020-07-17 NOTE — Progress Notes (Signed)
CSW spoke with patient about possible SNF placement. Patient stated that he is not a fan of SNF's and wanted to think about it. CSW also gave patient an option of home health if covered by his insurance. Patient agreed to give CSW an answer after PT sees him.

## 2020-07-17 NOTE — ED Notes (Signed)
Unable to get EKG at this time trying to trouble shoot with other staff orthostatic vitals charted

## 2020-07-17 NOTE — H&P (Signed)
History and Physical    Douglas Villa KGM:[id number] DOB: 05-08-1950 DOA: 07/17/2020  PCP: Lillard Anes, MD (Confirm with patient/family/NH records and if not entered, this has to be entered at [facility] point of entry) Patient coming from: Home  I have personally briefly reviewed patient's old medical records in [place]  Chief Complaint: Feeling dizzy  HPI: Douglas Villa is a 70 y.o. male with medical history significant of CAD with recent LAD stenting, chronic systolic CHF LVEF less than 20%, CKD stage II, HTN, HLD, OSA, IIDM presented with new onset of near syncope's.  Symptoms started 1 week ago, usually happens in the mornings, will feel lightheaded when sitting up or standing up, a few occasions had a feeling of " almost passing out", but was able to sit down quickly or hold onto something quickly to avoid falling.  Significantly, patient reported that "lost my taste" after admission 2 weeks ago, has been drinking about half a gallon milk and fruit cups instead meals for the last few days.  Report no significant urine color changes were urine monitoring test but does have a dull lower back pain for the last few weeks.  Patient claimed that he takes 8 pills every day, and he has been compliant with all his CHF and CAD medications.  Denies any abdominal pain, no diarrhea.  ED Course: Patient was found to have AKI and blood pressure borderline low.  Lab value creatinine 5.4 compared to baseline 1.9, potassium 6.0, bicarb 23, BUN 146.  Received 1 L of IV bolus and nephrology recommended continue IV fluid.  Review of Systems: As per HPI otherwise 14 point review of systems negative.    Past Medical History:  Diagnosis Date   Abnormal EKG 03/2009   CAD (coronary artery disease)    CKD (chronic kidney disease)    Heart failure with preserved left ventricular function (HFpEF) ([place])    Hypertension    OSA (obstructive sleep apnea)    Sexual dysfunction    Type 2 diabetes  mellitus with diabetic nephropathy ([place]) 06/18/2020    Past Surgical History:  Procedure Laterality Date   APPENDECTOMY  1969   ARTERIAL LINE INSERTION N/A 06/20/2020   Procedure: ARTERIAL LINE INSERTION;  Surgeon: Troy Sine, MD;  Location: [place] [facility];  Service: Cardiovascular;  Laterality: N/A;   CARDIAC CATHETERIZATION     CARDIOVERSION N/A 06/27/2020   Procedure: CARDIOVERSION;  Surgeon: Larey Dresser, MD;  Location: [facility] ENDOSCOPY;  Service: Cardiovascular;  Laterality: N/A;   CENTRAL LINE INSERTION  06/20/2020   Procedure: CENTRAL LINE INSERTION;  Surgeon: Troy Sine, MD;  Location: [place] [facility];  Service: Cardiovascular;;   CORONARY ANGIOPLASTY     CORONARY/GRAFT ACUTE MI REVASCULARIZATION N/A 06/20/2020   Procedure: Coronary/Graft Acute MI Revascularization;  Surgeon: Troy Sine, MD;  Location: [place] [facility];  Service: Cardiovascular;  Laterality: N/A;   IABP INSERTION N/A 06/20/2020   Procedure: IABP Insertion;  Surgeon: Troy Sine, MD;  Location: [place] [facility];  Service: Cardiovascular;  Laterality: N/A;   RIGHT/LEFT HEART CATH AND CORONARY ANGIOGRAPHY N/A 06/20/2020   Procedure: RIGHT/LEFT HEART CATH AND CORONARY ANGIOGRAPHY;  Surgeon: Troy Sine, MD;  Location: [place] [facility];  Service: Cardiovascular;  Laterality: N/A;   TEE WITHOUT CARDIOVERSION N/A 06/27/2020   Procedure: TRANSESOPHAGEAL ECHOCARDIOGRAM (TEE);  Surgeon: Larey Dresser, MD;  Location: [facility] ENDOSCOPY;  Service: Cardiovascular;  Laterality: N/A;     reports  that he quit smoking about 2 years ago. His smoking use included cigarettes. He has a 45.00 pack-year smoking history. He has never used smokeless tobacco. He reports that he does not drink alcohol and does not use drugs.  Allergies  Allergen Reactions   Penicillins     Sweating     Family History  Problem Relation Age of Onset   Cancer Mother 84   Stroke Father 47     Prior to Admission medications    Medication Sig Start Date End Date Taking? Authorizing Provider  ACCU-CHEK FASTCLIX LANCETS MISC by Does not apply route.    [provider]  albuterol (PROVENTIL HFA;VENTOLIN HFA) 108 (90 Base) MCG/ACT inhaler Inhale 2 puffs into the lungs every 6 (six) hours as needed for wheezing or shortness of breath. 08/05/17   Coral Ceo, NP  amiodarone (PACERONE) 200 MG tablet Take 1 tablet (200 mg total) by mouth 2 (two) times daily. 06/29/20   Filbert Schilder, NP  apixaban (ELIQUIS) 5 MG TABS tablet Take 1 tablet (5 mg total) by mouth 2 (two) times daily. 06/29/20   Georgie Chard D, NP  aspirin 81 MG tablet Take 1 tablet (81 mg total) by mouth daily. Patient not taking: Reported on 07/17/2020 09/02/17   Laurey Morale, MD  atorvastatin (LIPITOR) 80 MG tablet Take 1 tablet (80 mg total) by mouth daily. Needs appt for future refills Patient not taking: Reported on 07/17/2020 01/09/20   Laurey Morale, MD  Blood Glucose Monitoring Suppl (ACCU-CHEK AVIVA PLUS) w/Device KIT by Does not apply route.    [provider]  clopidogrel (PLAVIX) 75 MG tablet TAKE 1 TABLET BY MOUTH ONCE DAILY . APPOINTMENT REQUIRED FOR FUTURE REFILLS Patient taking differently: Take 75 mg by mouth daily. 01/09/20   Laurey Morale, MD  dapagliflozin propanediol (FARXIGA) 10 MG TABS tablet Take 1 tablet (10 mg total) by mouth daily. Patient not taking: Reported on 07/17/2020 06/30/20   Filbert Schilder, NP  digoxin (LANOXIN) 0.125 MG tablet Take 1 tablet (0.125 mg total) by mouth daily. 06/30/20   Georgie Chard D, NP  glucose blood test strip 1 each by Other route as needed. Use as instructed    [provider]  hydrALAZINE (APRESOLINE) 25 MG tablet TAKE 1 & 1/2 (ONE & ONE-HALF) TABLETS BY MOUTH THREE TIMES DAILY. NEED AN APPOINTMENT FOR FUTURE REFILLS Patient taking differently: Take 37.5 mg by mouth 3 (three) times daily. 04/04/20   Laurey Morale, MD  isosorbide mononitrate (IMDUR) 30 MG 24 hr tablet Take  1 tablet (30 mg total) by mouth daily. Needs appt for future refills Patient not taking: Reported on 07/17/2020 01/09/20   Laurey Morale, MD  nitroGLYCERIN (NITROSTAT) 0.4 MG SL tablet Place 0.4 mg under the tongue every 5 (five) minutes as needed for chest pain.    [provider]  Polyethylene Glycol 400 (VISINE DRY EYE RELIEF OP) Place 1 drop into both eyes daily as needed (For dry eyes).    [provider]  potassium chloride SA (KLOR-CON) 20 MEQ tablet Take 1 tablet (20 mEq total) by mouth daily. 06/29/20   Georgie Chard D, NP  spironolactone (ALDACTONE) 25 MG tablet Take 1 tablet by mouth once daily Patient taking differently: Take 25 mg by mouth daily. 09/24/19   Laurey Morale, MD  torsemide 40 MG TABS Take 40 mg by mouth daily. 06/29/20   Filbert Schilder, NP    Physical Exam: Vitals:  07/17/20 1024 07/17/20 1026 07/17/20 1027 07/17/20 1300  BP: 111/72 99/86  122/83  Pulse: 99   76  Resp:    20  Temp:      SpO2:    100%  Weight:   76 kg   Height:   '5\' 7"'$  (1.702 m)     Constitutional: NAD, calm, comfortable Vitals:   07/17/20 1024 07/17/20 1026 07/17/20 1027 07/17/20 1300  BP: 111/72 99/86  122/83  Pulse: 99   76  Resp:    20  Temp:      SpO2:    100%  Weight:   76 kg   Height:   '5\' 7"'$  (1.702 m)    Eyes: PERRL, lids and conjunctivae normal ENMT: Mucous membranes are dry Posterior pharynx clear of any exudate or lesions.Normal dentition.  Neck: normal, supple, no masses, no thyromegaly Respiratory: clear to auscultation bilaterally, no wheezing, no crackles. Normal respiratory effort. No accessory muscle use.  Cardiovascular: Regular rate and rhythm, no murmurs / rubs / gallops. No extremity edema. 2+ pedal pulses. No carotid bruits.  Abdomen: no tenderness, no masses palpated. No hepatosplenomegaly. Bowel sounds positive.  Musculoskeletal: no clubbing / cyanosis. No joint deformity upper and lower extremities. Good ROM, no contractures. Normal muscle  tone.  Skin: no rashes, lesions, ulcers. No induration Neurologic: CN 2-12 grossly intact. Sensation intact, DTR normal. Strength 5/5 in all 4.  Psychiatric: Normal judgment and insight. Alert and oriented x 3. Normal mood.     Labs on Admission: I have personally reviewed following labs and imaging studies  CBC: Recent Labs  Lab 07/17/20 1238  WBC 9.9  NEUTROABS 8.8*  HGB 15.7  HCT 47.8  MCV 85.7  PLT 300   Basic Metabolic Panel: Recent Labs  Lab 07/17/20 1238  NA 132*  K 6.0*  CL 95*  CO2 23  GLUCOSE 166*  BUN 146*  CREATININE 5.47*  CALCIUM 9.9   GFR: Estimated Creatinine Clearance: 11.4 mL/min (A) (by C-G formula based on SCr of 5.47 mg/dL (H)). Liver Function Tests: No results for input(s): AST, ALT, ALKPHOS, BILITOT, PROT, ALBUMIN in the last 168 hours. No results for input(s): LIPASE, AMYLASE in the last 168 hours. No results for input(s): AMMONIA in the last 168 hours. Coagulation Profile: No results for input(s): INR, PROTIME in the last 168 hours. Cardiac Enzymes: No results for input(s): CKTOTAL, CKMB, CKMBINDEX, TROPONINI in the last 168 hours. BNP (last 3 results) No results for input(s): PROBNP in the last 8760 hours. HbA1C: No results for input(s): HGBA1C in the last 72 hours. CBG: No results for input(s): GLUCAP in the last 168 hours. Lipid Profile: No results for input(s): CHOL, HDL, LDLCALC, TRIG, CHOLHDL, LDLDIRECT in the last 72 hours. Thyroid Function Tests: No results for input(s): TSH, T4TOTAL, FREET4, T3FREE, THYROIDAB in the last 72 hours. Anemia Panel: No results for input(s): VITAMINB12, FOLATE, FERRITIN, TIBC, IRON, RETICCTPCT in the last 72 hours. Urine analysis: No results found for: COLORURINE, APPEARANCEUR, [place], [place], GLUCOSEU, [place], BILIRUBINUR, KETONESUR, PROTEINUR, [place], NITRITE, LEUKOCYTESUR  Radiological Exams on Admission: DG Chest Port 1 View  Result Date: 07/17/2020 CLINICAL DATA:  Weakness EXAM:  PORTABLE CHEST 1 VIEW COMPARISON:  Jun 21, 2020 FINDINGS: The lungs are clear. Heart is mildly enlarged with pulmonary vascularity normal. No adenopathy. No pneumothorax. No bone lesions. There is aortic atherosclerosis. IMPRESSION: Mild cardiomegaly. Lungs clear. Aortic Atherosclerosis (ICD10-I70.0). Electronically Signed   By: Lowella Grip III M.D.   On: 07/17/2020 11:53  EKG: Independently reviewed. Sinus, no T wave morphology changes  Assessment/Plan Active Problems:   AKI (acute kidney injury) ([place])  (please populate well all problems here in Problem List. (For example, if patient is on BP meds at home and you resume or decide to hold them, it is a problem that needs to be her. Same for CAD, COPD, HLD and so on)   AKI on CKD stage II -Probably ATN from severe dehydration and poor oral intake.  Patient's weight was 77.7-78 kg last month and now only 76 kg. -Received 1 L regular bolus and agreed with nephrology, to continue normal saline at 125 mL/h. -Check renal ultrasound to rule out any obstructions.  Hyperkalemia -No ST-T changes on EKG. -Received cocktail of insulin, dextrose and Lokelma -IV fluid, repeat BMP tonight and tomorrow morning.  Near syncope -Likely from overdiuresis and hypovolemia and orthostatic hypotension.  Hold diuresis and BP meds.  CAD with recent LAD stenting -As per cardiologist discharge summary on May 29, recommend aspirin Plavix dual antiplatelet regimen for 1 month until June 2016 and discontinue aspirin and keep Plavix alone, as patient also on Eliquis for A. fib.  Chronic systolic CHF -Signs of severe dehydration and hypovolemia, hold blood pressure meds and diuresis -Check digoxin level.  PAF -Hold amiodarone, continue Eliquis.  HTN -Hold all BP meds, start as needed hydralazine.  IIDM -Hold Farxiga, start sliding scale.   DVT prophylaxis: Eliquis Code Status: Full code Family Communication: None at bedside Disposition Plan: Expect  more than 2 midnight hospital stay to stabilize kidney function. Consults called: Nephrology Dr. Clover Mealy Admission status: Telemetry admission   Lequita Halt MD [facility] Pager [phone]  07/17/2020, 2:43 PM

## 2020-07-17 NOTE — ED Notes (Signed)
Discussed heart monitor alerts with RN.

## 2020-07-17 NOTE — ED Triage Notes (Signed)
Patient was at his heart clinic where became dizzy with standing. C/o weakness and dizziness x1 week. Denies chest pain or shortness of breath. Ex wife brought him here, currently staying in a motel, noted bed bugs upon cloths removal.

## 2020-07-17 NOTE — Evaluation (Signed)
Physical Therapy Evaluation Patient Details Name: Douglas Villa MRN: [id number] DOB: [birth year] Today's Date: 07/17/2020   History of Present Illness  Pt is a 70 y/o male admitted secondary to dizziness, and weakness. Pt with bed bugs upon presentation as well. Found to have AKI and hyperkalemia. PMH includes CHF, CAD, HTN, COPD, OSA, and COPD.  Clinical Impression  Pt admitted secondary to problem above with deficits below. Pt with + orthostatics so mobility limited to standing this session. Pt requiring supervision for bed mobility and min guard A for safety to stand. Reporting "wooziness" with standing. Pt currently lives alone in a motel, but reports his ex wife can assist if needed. Will need to progress mobility further to determine most appropriate d/c recommendations. Will continue to follow acutely.     07/17/20 1528  Orthostatic Lying   BP- Lying 130/87  Pulse- Lying 60  Orthostatic Sitting  BP- Sitting 98/82  Pulse- Sitting 95  Orthostatic Standing at 0 minutes  BP- Standing at 0 minutes 96/71  Pulse- Standing at 0 minutes 101      Follow Up Recommendations Other (comment) (TBD pending progression)    Equipment Recommendations  3in1 (PT)    Recommendations for Other Services OT consult     Precautions / Restrictions Precautions Precautions: Fall;Other (comment) Precaution Comments: watch BP Restrictions Weight Bearing Restrictions: No      Mobility  Bed Mobility Overal bed mobility: Needs Assistance Bed Mobility: Supine to Sit;Sit to Supine     Supine to sit: Supervision Sit to supine: Supervision   General bed mobility comments: Supervision for safety    Transfers Overall transfer level: Needs assistance Equipment used: None Transfers: Sit to/from Stand Sit to Stand: Min guard         General transfer comment: Min guard for safety. Pt with + orthostatics and reports feeling woozy, so further mobility deferred.  Ambulation/Gait                 Stairs            Wheelchair Mobility    Modified Rankin (Stroke Patients Only)       Balance Overall balance assessment: Mild deficits observed, not formally tested                                           Pertinent Vitals/Pain Pain Assessment: No/denies pain    Home Living Family/patient expects to be discharged to:: Other (Comment) (motel) Living Arrangements: Alone Available Help at Discharge: Friend(s);Available PRN/intermittently Type of Home: Other(Comment) (hotel) Home Access: Level entry     Home Layout: One level Home Equipment: None      Prior Function Level of Independence: Independent         Comments: Did report recent difficulty getting into/out of tub since symptoms began     Hand Dominance        Extremity/Trunk Assessment   Upper Extremity Assessment Upper Extremity Assessment: Defer to OT evaluation    Lower Extremity Assessment Lower Extremity Assessment: Generalized weakness    Cervical / Trunk Assessment Cervical / Trunk Assessment: Normal  Communication   Communication: No difficulties  Cognition Arousal/Alertness: Awake/alert Behavior During Therapy: WFL for tasks assessed/performed Overall Cognitive Status: Within Functional Limits for tasks assessed  General Comments      Exercises     Assessment/Plan    PT Assessment Patient needs continued PT services  PT Problem List Decreased strength;Decreased balance;Decreased mobility;Decreased activity tolerance;Decreased knowledge of precautions       PT Treatment Interventions Gait training;Functional mobility training;Therapeutic activities;Balance training;Therapeutic exercise;Patient/family education    PT Goals (Current goals can be found in the Care Plan section)  Acute Rehab PT Goals Patient Stated Goal: to feel better PT Goal Formulation: With patient Time For Goal  Achievement: 07/31/20 Potential to Achieve Goals: Good    Frequency Min 3X/week   Barriers to discharge        Co-evaluation               AM-PAC PT "6 Clicks" Mobility  Outcome Measure Help needed turning from your back to your side while in a flat bed without using bedrails?: None Help needed moving from lying on your back to sitting on the side of a flat bed without using bedrails?: None Help needed moving to and from a bed to a chair (including a wheelchair)?: A Little Help needed standing up from a chair using your arms (e.g., wheelchair or bedside chair)?: A Little Help needed to walk in hospital room?: A Little Help needed climbing 3-5 steps with a railing? : A Lot 6 Click Score: 19    End of Session Equipment Utilized During Treatment: Gait belt Activity Tolerance: Treatment limited secondary to medical complications (Comment) (+ orthostatics) Patient left: in bed;with call bell/phone within reach;with nursing/sitter in room (on stretcher in ED) Nurse Communication: Mobility status PT Visit Diagnosis: Other abnormalities of gait and mobility (R26.89);Difficulty in walking, not elsewhere classified (R26.2)    Time: 4825-0037 PT Time Calculation (min) (ACUTE ONLY): 23 min   Charges:   PT Evaluation $PT Eval Moderate Complexity: 1 Mod PT Treatments $Therapeutic Activity: 8-22 mins        Lou Miner, DPT  Acute Rehabilitation Services  Pager: [phone] Office: [phone]   Rudean Hitt 07/17/2020, 4:26 PM

## 2020-07-17 NOTE — Discharge Planning (Signed)
RNCM consulted regarding safe discharge planning (Home with Home Health vs Skilled Nursing Facility Placement).  Physical Therapy evaluation placed; will follow up after recommendations from PT.     

## 2020-07-17 NOTE — Consult Note (Signed)
[facility] Renal Consultation Note  Requesting MD: Roosevelt Locks Indication for Consultation: A on CRF  HPI:  Douglas Villa is a 70 y.o. male with past medical history significant for CAD and chronic systolic CHF- EF of 89% or less- followed by Dr. Aundra Dubin- also history of smoking with COPD, DM, hyperlipidemia and OSA.  He had been lost to follow up at the heart failure clinic, out of meds- admitted 5/20 with symptomatic heart failure- had emergent PCI of LAD, reqired transient balloon pump and milrinone, diuresed, discharged on eliquis, dig, amiodarone, plavix, farxiga, hydralazine, aldactone, potassium and torsemide 40 daily.  Crt had been as high as 2.27 during that hospitalization-  was as low as 1.5 but at discharge pm 5/29 was 1.9 ( there were some crts from 2019 that were 1.8 to 2.  He was discharged on 5/29.  He presented to medical attention today  with a week history of near syncope and more recent loss of appetite-  little to eat or drink but was taking his medications/ Labs upon arrival to ER -  crt 5.47 and K of 6, hgb 15-  SBP in the 90's-  no edema.  He is conversant in the ER-  also found to be covered in bed bugs.  In the ER has been given some fluid and medical treatment for potassium -  currently in NAD.  He denies any major change in UOP or urinary sxms-  has not been taking NSAIDS.  Recheck of labs pending as well as dig level.  Renal ultrasound shows small kidneys- no obstruction   Creatinine, Ser  Date/Time Value Ref Range Status  07/17/2020 12:38 PM 5.47 (H) 0.61 - 1.24 mg/dL Final  06/29/2020 04:53 AM 1.94 (H) 0.61 - 1.24 mg/dL Final  06/28/2020 05:00 AM 1.96 (H) 0.61 - 1.24 mg/dL Final  06/27/2020 05:00 AM 1.96 (H) 0.61 - 1.24 mg/dL Final  06/26/2020 04:54 AM 1.52 (H) 0.61 - 1.24 mg/dL Final  06/25/2020 04:08 AM 1.55 (H) 0.61 - 1.24 mg/dL Final  06/24/2020 03:32 AM 1.73 (H) 0.61 - 1.24 mg/dL Final  06/23/2020 03:56 AM 1.99 (H) 0.61 - 1.24 mg/dL Final   06/22/2020 05:05 AM 1.89 (H) 0.61 - 1.24 mg/dL Final  06/21/2020 03:52 AM 2.06 (H) 0.61 - 1.24 mg/dL Final  06/20/2020 10:49 PM 2.16 (H) 0.61 - 1.24 mg/dL Final  06/20/2020 06:15 PM 2.27 (H) 0.61 - 1.24 mg/dL Final  06/20/2020 01:35 PM 2.25 (H) 0.61 - 1.24 mg/dL Final  06/20/2020 11:47 AM 2.10 (H) 0.61 - 1.24 mg/dL Final  02/09/2018 02:59 PM 2.35 (H) 0.61 - 1.24 mg/dL Final  01/30/2018 11:58 AM 1.81 (H) 0.61 - 1.24 mg/dL Final  11/18/2017 12:19 PM 1.92 (H) 0.61 - 1.24 mg/dL Final  11/07/2017 11:49 AM 2.03 (H) 0.61 - 1.24 mg/dL Final  10/28/2017 11:33 AM 1.82 (H) 0.61 - 1.24 mg/dL Final  10/19/2017 11:10 AM 2.51 (H) 0.61 - 1.24 mg/dL Final  10/07/2017 09:59 AM 2.09 (H) 0.61 - 1.24 mg/dL Final  09/19/2017 11:32 AM 2.11 (H) 0.61 - 1.24 mg/dL Final  09/12/2017 11:42 AM 1.76 (H) 0.61 - 1.24 mg/dL Final  09/02/2017 11:27 AM 1.85 (H) 0.61 - 1.24 mg/dL Final  08/19/2017 08:48 AM 2.00 (H) 0.76 - 1.27 mg/dL Final  07/07/2017 02:05 PM 2.06 (H) 0.76 - 1.27 mg/dL Final     PMHx:   Past Medical History:  Diagnosis Date   Abnormal EKG 03/2009   CAD (coronary artery disease)    CKD (chronic kidney disease)  Heart failure with preserved left ventricular function (HFpEF) ([place])    Hypertension    OSA (obstructive sleep apnea)    Sexual dysfunction    Type 2 diabetes mellitus with diabetic nephropathy ([place]) 06/18/2020    Past Surgical History:  Procedure Laterality Date   APPENDECTOMY  1969   ARTERIAL LINE INSERTION N/A 06/20/2020   Procedure: ARTERIAL LINE INSERTION;  Surgeon: Troy Sine, MD;  Location: [place] [facility];  Service: Cardiovascular;  Laterality: N/A;   CARDIAC CATHETERIZATION     CARDIOVERSION N/A 06/27/2020   Procedure: CARDIOVERSION;  Surgeon: Larey Dresser, MD;  Location: [facility] ENDOSCOPY;  Service: Cardiovascular;  Laterality: N/A;   CENTRAL LINE INSERTION  06/20/2020   Procedure: CENTRAL LINE INSERTION;  Surgeon: Troy Sine, MD;  Location: [place] [facility];   Service: Cardiovascular;;   CORONARY ANGIOPLASTY     CORONARY/GRAFT ACUTE MI REVASCULARIZATION N/A 06/20/2020   Procedure: Coronary/Graft Acute MI Revascularization;  Surgeon: Troy Sine, MD;  Location: [place] [facility];  Service: Cardiovascular;  Laterality: N/A;   IABP INSERTION N/A 06/20/2020   Procedure: IABP Insertion;  Surgeon: Troy Sine, MD;  Location: [place] [facility];  Service: Cardiovascular;  Laterality: N/A;   RIGHT/LEFT HEART CATH AND CORONARY ANGIOGRAPHY N/A 06/20/2020   Procedure: RIGHT/LEFT HEART CATH AND CORONARY ANGIOGRAPHY;  Surgeon: Troy Sine, MD;  Location: [place] [facility];  Service: Cardiovascular;  Laterality: N/A;   TEE WITHOUT CARDIOVERSION N/A 06/27/2020   Procedure: TRANSESOPHAGEAL ECHOCARDIOGRAM (TEE);  Surgeon: Larey Dresser, MD;  Location: [facility] ENDOSCOPY;  Service: Cardiovascular;  Laterality: N/A;    Family Hx:  Family History  Problem Relation Age of Onset   Cancer Mother 66   Stroke Father 30    Social History:  reports that he quit smoking about 2 years ago. His smoking use included cigarettes. He has a 45.00 pack-year smoking history. He has never used smokeless tobacco. He reports that he does not drink alcohol and does not use drugs.  Allergies:  Allergies  Allergen Reactions   Penicillins     Sweating     Medications: Prior to Admission medications   Medication Sig Start Date End Date Taking? Authorizing Provider  ACCU-CHEK FASTCLIX LANCETS MISC by Does not apply route.    [provider]  albuterol (PROVENTIL HFA;VENTOLIN HFA) 108 (90 Base) MCG/ACT inhaler Inhale 2 puffs into the lungs every 6 (six) hours as needed for wheezing or shortness of breath. 08/05/17   Lauraine Rinne, NP  amiodarone (PACERONE) 200 MG tablet Take 1 tablet (200 mg total) by mouth 2 (two) times daily. 06/29/20   Tommie Raymond, NP  apixaban (ELIQUIS) 5 MG TABS tablet Take 1 tablet (5 mg total) by mouth 2 (two) times daily. 06/29/20   Kathyrn Drown D, NP  aspirin 81 MG tablet Take 1 tablet (81 mg total) by mouth daily. Patient not taking: Reported on 07/17/2020 09/02/17   Larey Dresser, MD  atorvastatin (LIPITOR) 80 MG tablet Take 1 tablet (80 mg total) by mouth daily. Needs appt for future refills Patient not taking: Reported on 07/17/2020 01/09/20   Larey Dresser, MD  Blood Glucose Monitoring Suppl (ACCU-CHEK AVIVA PLUS) w/Device KIT by Does not apply route.    [provider]  clopidogrel (PLAVIX) 75 MG tablet TAKE 1 TABLET BY MOUTH ONCE DAILY . APPOINTMENT REQUIRED FOR FUTURE REFILLS Patient taking differently: Take 75 mg by mouth daily. 01/09/20   Larey Dresser,  MD  dapagliflozin propanediol (FARXIGA) 10 MG TABS tablet Take 1 tablet (10 mg total) by mouth daily. Patient not taking: Reported on 07/17/2020 06/30/20   Tommie Raymond, NP  digoxin (LANOXIN) 0.125 MG tablet Take 1 tablet (0.125 mg total) by mouth daily. 06/30/20   Kathyrn Drown D, NP  glucose blood test strip 1 each by Other route as needed. Use as instructed    [provider]  hydrALAZINE (APRESOLINE) 25 MG tablet TAKE 1 & 1/2 (ONE & ONE-HALF) TABLETS BY MOUTH THREE TIMES DAILY. NEED AN APPOINTMENT FOR FUTURE REFILLS Patient taking differently: Take 37.5 mg by mouth 3 (three) times daily. 04/04/20   Larey Dresser, MD  isosorbide mononitrate (IMDUR) 30 MG 24 hr tablet Take 1 tablet (30 mg total) by mouth daily. Needs appt for future refills Patient not taking: Reported on 07/17/2020 01/09/20   Larey Dresser, MD  nitroGLYCERIN (NITROSTAT) 0.4 MG SL tablet Place 0.4 mg under the tongue every 5 (five) minutes as needed for chest pain.    [provider]  Polyethylene Glycol 400 (VISINE DRY EYE RELIEF OP) Place 1 drop into both eyes daily as needed (For dry eyes).    [provider]  potassium chloride SA (KLOR-CON) 20 MEQ tablet Take 1 tablet (20 mEq total) by mouth daily. 06/29/20   Kathyrn Drown D, NP  spironolactone (ALDACTONE)  25 MG tablet Take 1 tablet by mouth once daily Patient taking differently: Take 25 mg by mouth daily. 09/24/19   Larey Dresser, MD  torsemide 40 MG TABS Take 40 mg by mouth daily. 06/29/20   Kathyrn Drown D, NP    I have reviewed the patient's current medications.  Labs:  Results for orders placed or performed during the hospital encounter of 07/17/20 (from the past 48 hour(s))  Basic metabolic panel     Status: Abnormal   Collection Time: 07/17/20 12:38 PM  Result Value Ref Range   Sodium 132 (L) 135 - 145 mmol/L   Potassium 6.0 (H) 3.5 - 5.1 mmol/L   Chloride 95 (L) 98 - 111 mmol/L   CO2 23 22 - 32 mmol/L   Glucose, Bld 166 (H) 70 - 99 mg/dL    Comment: Glucose reference range applies only to samples taken after fasting for at least 8 hours.   BUN 146 (H) 8 - 23 mg/dL   Creatinine, Ser 5.47 (H) 0.61 - 1.24 mg/dL   Calcium 9.9 8.9 - 10.3 mg/dL   GFR, Estimated 10 (L) >60 mL/min    Comment: (NOTE) Calculated using the CKD-EPI Creatinine Equation (2021)    Anion gap 14 5 - 15    Comment: Performed at [facility] [address] [place], [place] [address]  CBC with Differential     Status: Abnormal   Collection Time: 07/17/20 12:38 PM  Result Value Ref Range   WBC 9.9 4.0 - 10.5 K/uL   RBC 5.58 4.22 - 5.81 MIL/uL   Hemoglobin 15.7 13.0 - 17.0 g/dL   HCT 47.8 39.0 - 52.0 %   MCV 85.7 80.0 - 100.0 fL   MCH 28.1 26.0 - 34.0 pg   MCHC 32.8 30.0 - 36.0 g/dL   RDW 19.8 (H) 11.5 - 15.5 %   Platelets 303 150 - 400 K/uL   nRBC 0.0 0.0 - 0.2 %   Neutrophils Relative % 89 %   Neutro Abs 8.8 (H) 1.7 - 7.7 K/uL   Lymphocytes Relative 6 %   Lymphs Abs  0.6 (L) 0.7 - 4.0 K/uL   Monocytes Relative 5 %   Monocytes Absolute 0.5 0.1 - 1.0 K/uL   Eosinophils Relative 0 %   Eosinophils Absolute 0.0 0.0 - 0.5 K/uL   Basophils Relative 0 %   Basophils Absolute 0.0 0.0 - 0.1 K/uL   Immature Granulocytes 0 %   Abs Immature Granulocytes 0.04 0.00 - 0.07 K/uL    Comment: Performed at  [facility] [address] [place], [place] [address]  Brain natriuretic peptide     Status: Abnormal   Collection Time: 07/17/20 12:38 PM  Result Value Ref Range   B Natriuretic Peptide 391.9 (H) 0.0 - 100.0 pg/mL    Comment: Performed at [facility] [address] [place], [place] [address]     ROS:  A comprehensive review of systems was negative except for: Cardiovascular: positive for near-syncope  Physical Exam: Vitals:   07/17/20 1026 07/17/20 1300  BP: 99/86 122/83  Pulse:  76  Resp:  20  Temp:    SpO2:  100%     General: alert, NAD HEENT: PERRLA, EOMI, mucous membranes moist Neck: no JVD Heart: irreg Lungs: mostly clear Abdomen: soft, non tender Extremities: no edema Skin: warm and dry Neuro: alert, non focal   Assessment/Plan: 70 year old BM with significant cardiac pathology now presents with A on CRF in the setting of what appears to be volume depletion and hypotension  1.Renal- pt has some CKD dating back to 2019- possibly due to DM or cardiorenal.  Has small kidney sizes on imaging.  This most recent change from 5/29 to today seems to be due to volume depletion and hypotension that was iatrogenic from meds.  Agree with plan to hold all BP meds, potassium and hydrate gently to see if can get renal function back to where it was 2 weeks ago.  No absolute indications for HD right now but will need to follow labs especially potassium closely.  Will check U/A to look for any signs of other etiologies of AKI such as GN-  which I doubt.  Other possibility would be CES on the basis of recent cardiac intervention  2. Hypertension/volume  - volume depleted and hypotensive-  holding BP meds and gently hydrating 3. Hyperkalemia -  on aldactone and potassium as OP-  hold and medical manage with recheck pending    Louis Meckel 07/17/2020, 3:11 PM

## 2020-07-17 NOTE — ED Notes (Signed)
Pt provided with milk .pillow and blanket per his request. His belongings have been placed in a biohazard bag due to visible bed bugs on the patient.

## 2020-07-17 NOTE — ED Provider Notes (Signed)
[facility] EMERGENCY DEPARTMENT Provider Note   CSN: [id number] Arrival date & time:        History Chief Complaint  Patient presents with   Weakness    Douglas Villa is a 70 y.o. male.  Douglas Villa presents from heart failure clinic  Almost 1 month ago, he had an ST segment elevation MI with obstruction of the left anterior descending artery. He has significant heart failure and other chronic medical problems.   He was discharged home to a motel where he states that he has been living since before his heart attack.  He was brought to the ED from clinic because he endorsed significant lightheadedness when standing and even when sitting.  He is unable to ambulate even very short distances. He has had loss of appetite, and he states that he can only eat fruit cups and drink milk.  He states that he is taking all his medication.  He endorses occasional depression but states that it comes and goes.  He was found to have bedbugs, but when questioned, he denied having lack of resources. He did admit to the clinic provider that he was unable to afford some of his medications.  The history is provided by the patient.  Weakness Severity:  Severe Onset quality:  Gradual Duration:  1 week Timing:  Constant Progression:  Worsening Chronicity:  New Context comment:  Recent STEMI Worsened by:  Standing Ineffective treatments:  None tried Associated symptoms: anorexia, nausea and near-syncope   Associated symptoms: no abdominal pain, no arthralgias, no chest pain, no cough, no dysuria, no fever, no seizures, no sensory-motor deficit, no shortness of breath, no stroke symptoms, no syncope and no vomiting       Past Medical History:  Diagnosis Date   Abnormal EKG 03/2009   CAD (coronary artery disease)    CKD (chronic kidney disease)    Heart failure with preserved left ventricular function (HFpEF) (HCC)    Hypertension    OSA (obstructive sleep apnea)    Sexual  dysfunction    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    Patient Active Problem List   Diagnosis Date Noted   Atrial fibrillation ([place]) 06/29/2020   Tricuspid regurgitation 06/29/2020   Anemia 06/29/2020   Hyponatremia 06/29/2020   Thrombocytopenia ([place]) 06/29/2020   STEMI involving left anterior descending coronary artery ([place]) 06/20/2020   Acute ST elevation myocardial infarction (STEMI) due to occlusion of distal portion of left anterior descending (LAD) coronary artery (HCC)    Cardiogenic shock ([place])    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/18/2020   Healthcare maintenance 09/16/2017   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   Obstructive sleep apnea 08/05/2017   Hyperlipidemia 06/03/2017   Acute on chronic systolic heart failure ([place]) 06/03/2017   Chronic kidney disease, stage 3 ([place]) 06/03/2017   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[place]) 06/03/2017   Essential hypertension 06/03/2017   Atherosclerotic heart disease of native coronary artery with unstable angina pectoris ([place]) 06/03/2017   Ex-smoker 06/03/2017    Past Surgical History:  Procedure Laterality Date   APPENDECTOMY  1969   ARTERIAL LINE INSERTION N/A 06/20/2020   Procedure: ARTERIAL LINE INSERTION;  Surgeon: Troy Sine, MD;  Location: [facility];  Service: Cardiovascular;  Laterality: N/A;   CARDIAC CATHETERIZATION     CARDIOVERSION N/A 06/27/2020   Procedure: CARDIOVERSION;  Surgeon: Larey Dresser, MD;  Location: [facility];  Service: Cardiovascular;  Laterality:  N/A;   CENTRAL LINE INSERTION  06/20/2020   Procedure: CENTRAL LINE INSERTION;  Surgeon: Troy Sine, MD;  Location: [facility];  Service: Cardiovascular;;   CORONARY ANGIOPLASTY     CORONARY/GRAFT ACUTE MI REVASCULARIZATION N/A 06/20/2020   Procedure: Coronary/Graft Acute MI Revascularization;  Surgeon: Troy Sine, MD;  Location: [facility];  Service: Cardiovascular;  Laterality:  N/A;   IABP INSERTION N/A 06/20/2020   Procedure: IABP Insertion;  Surgeon: Troy Sine, MD;  Location: [facility];  Service: Cardiovascular;  Laterality: N/A;   RIGHT/LEFT HEART CATH AND CORONARY ANGIOGRAPHY N/A 06/20/2020   Procedure: RIGHT/LEFT HEART CATH AND CORONARY ANGIOGRAPHY;  Surgeon: Troy Sine, MD;  Location: [facility];  Service: Cardiovascular;  Laterality: N/A;   TEE WITHOUT CARDIOVERSION N/A 06/27/2020   Procedure: TRANSESOPHAGEAL ECHOCARDIOGRAM (TEE);  Surgeon: Larey Dresser, MD;  Location: [facility];  Service: Cardiovascular;  Laterality: N/A;       Family History  Problem Relation Age of Onset   Cancer Mother 49   Stroke Father 78    Social History   Tobacco Use   Smoking status: Former    Packs/day: 1.00    Years: 45.00    Pack years: 45.00    Types: Cigarettes    Quit date: 2020    Years since quitting: 2.4   Smokeless tobacco: Never  Vaping Use   Vaping Use: Never used  Substance Use Topics   Alcohol use: No   Drug use: No    Home Medications Prior to Admission medications   Medication Sig Start Date End Date Taking? Authorizing Provider  ACCU-CHEK FASTCLIX LANCETS MISC by Does not apply route.    [provider]  albuterol (PROVENTIL HFA;VENTOLIN HFA) 108 (90 Base) MCG/ACT inhaler Inhale 2 puffs into the lungs every 6 (six) hours as needed for wheezing or shortness of breath. 08/05/17   Lauraine Rinne, NP  amiodarone (PACERONE) 200 MG tablet Take 1 tablet (200 mg total) by mouth 2 (two) times daily. 06/29/20   Tommie Raymond, NP  apixaban (ELIQUIS) 5 MG TABS tablet Take 1 tablet (5 mg total) by mouth 2 (two) times daily. 06/29/20   Kathyrn Drown D, NP  aspirin 81 MG tablet Take 1 tablet (81 mg total) by mouth daily. Patient not taking: Reported on 07/17/2020 09/02/17   Larey Dresser, MD  atorvastatin (LIPITOR) 80 MG tablet Take 1 tablet (80 mg total) by mouth daily. Needs appt for future refills Patient not taking:  Reported on 07/17/2020 01/09/20   Larey Dresser, MD  Blood Glucose Monitoring Suppl (ACCU-CHEK AVIVA PLUS) w/Device KIT by Does not apply route.    [provider]  clopidogrel (PLAVIX) 75 MG tablet TAKE 1 TABLET BY MOUTH ONCE DAILY . APPOINTMENT REQUIRED FOR FUTURE REFILLS Patient taking differently: Take 75 mg by mouth daily. 01/09/20   Larey Dresser, MD  dapagliflozin propanediol (FARXIGA) 10 MG TABS tablet Take 1 tablet (10 mg total) by mouth daily. Patient not taking: Reported on 07/17/2020 06/30/20   Tommie Raymond, NP  digoxin (LANOXIN) 0.125 MG tablet Take 1 tablet (0.125 mg total) by mouth daily. 06/30/20   Kathyrn Drown D, NP  glucose blood test strip 1 each by Other route as needed. Use as instructed    [provider]  hydrALAZINE (APRESOLINE) 25 MG tablet TAKE 1 & 1/2 (ONE & ONE-HALF) TABLETS BY MOUTH THREE TIMES DAILY. NEED AN APPOINTMENT FOR FUTURE  REFILLS Patient taking differently: Take 37.5 mg by mouth 3 (three) times daily. 04/04/20   Laurey Morale, MD  isosorbide mononitrate (IMDUR) 30 MG 24 hr tablet Take 1 tablet (30 mg total) by mouth daily. Needs appt for future refills Patient not taking: Reported on 07/17/2020 01/09/20   Laurey Morale, MD  nitroGLYCERIN (NITROSTAT) 0.4 MG SL tablet Place 0.4 mg under the tongue every 5 (five) minutes as needed for chest pain.    [provider]  Polyethylene Glycol 400 (VISINE DRY EYE RELIEF OP) Place 1 drop into both eyes daily as needed (For dry eyes).    [provider]  potassium chloride SA (KLOR-CON) 20 MEQ tablet Take 1 tablet (20 mEq total) by mouth daily. 06/29/20   Georgie Chard D, NP  spironolactone (ALDACTONE) 25 MG tablet Take 1 tablet by mouth once daily Patient taking differently: Take 25 mg by mouth daily. 09/24/19   Laurey Morale, MD  torsemide 40 MG TABS Take 40 mg by mouth daily. 06/29/20   Filbert Schilder, NP    Allergies    Penicillins  Review of Systems   Review of  Systems  Constitutional:  Positive for appetite change. Negative for chills and fever.  HENT:  Negative for ear pain and sore throat.   Eyes:  Negative for pain and visual disturbance.  Respiratory:  Negative for cough and shortness of breath.   Cardiovascular:  Positive for near-syncope. Negative for chest pain, palpitations and syncope.  Gastrointestinal:  Positive for anorexia and nausea. Negative for abdominal pain and vomiting.  Genitourinary:  Negative for dysuria and hematuria.  Musculoskeletal:  Negative for arthralgias and back pain.  Skin:  Negative for color change and rash.  Neurological:  Positive for weakness. Negative for seizures and syncope.  All other systems reviewed and are negative.  Physical Exam Updated Vital Signs BP 99/86 (BP Location: Right Arm)   Pulse 99   Temp (!) 97.5 F (36.4 C)   Ht 5\' 7"  (1.702 m)   Wt 76 kg   SpO2 99%   BMI 26.24 kg/m   Physical Exam Vitals and nursing note reviewed.  Constitutional:      Appearance: He is well-developed.  HENT:     Head: Normocephalic and atraumatic.  Eyes:     Conjunctiva/sclera: Conjunctivae normal.  Cardiovascular:     Rate and Rhythm: Normal rate and regular rhythm.     Heart sounds: No murmur heard. Pulmonary:     Effort: Pulmonary effort is normal. No respiratory distress.     Breath sounds: Normal breath sounds.  Abdominal:     Palpations: Abdomen is soft.     Tenderness: There is no abdominal tenderness.  Musculoskeletal:        General: No swelling or deformity.     Cervical back: Neck supple.     Right lower leg: No edema.     Left lower leg: No edema.  Skin:    General: Skin is warm and dry.  Neurological:     General: No focal deficit present.     Mental Status: He is alert and oriented to person, place, and time.  Psychiatric:        Behavior: Behavior normal.    ED Results / Procedures / Treatments   Labs (all labs ordered are listed, but only abnormal results are  displayed) Labs Reviewed  BASIC METABOLIC PANEL - Abnormal; Notable for the following components:      Result Value  Sodium 132 (*)    Potassium 6.0 (*)    Chloride 95 (*)    Glucose, Bld 166 (*)    BUN 146 (*)    Creatinine, Ser 5.47 (*)    GFR, Estimated 10 (*)    All other components within normal limits  CBC WITH DIFFERENTIAL/PLATELET - Abnormal; Notable for the following components:   RDW 19.8 (*)    Neutro Abs 8.8 (*)    Lymphs Abs 0.6 (*)    All other components within normal limits  BRAIN NATRIURETIC PEPTIDE - Abnormal; Notable for the following components:   B Natriuretic Peptide 391.9 (*)    All other components within normal limits  RESP PANEL BY RT-PCR (FLU A&B, COVID) [id number]  DIGOXIN LEVEL    EKG EKG Interpretation  Date/Time:  Thursday July 17 2020 13:07:37 EDT Ventricular Rate:  97 PR Interval:  252 QRS Duration: 122 QT Interval:  387 QTC Calculation: 492 R Axis:   261 Text Interpretation: Sinus or ectopic atrial rhythm Prolonged PR interval Nonspecific IVCD with LAD Probable inferior infarct, old Abnormal lateral Q waves no acute ischemia Confirmed by Lorre Munroe ([id number]) on 07/17/2020 1:27:20 PM  Radiology DG Chest Port 1 View  Result Date: 07/17/2020 CLINICAL DATA:  Weakness EXAM: PORTABLE CHEST 1 VIEW COMPARISON:  Jun 21, 2020 FINDINGS: The lungs are clear. Heart is mildly enlarged with pulmonary vascularity normal. No adenopathy. No pneumothorax. No bone lesions. There is aortic atherosclerosis. IMPRESSION: Mild cardiomegaly. Lungs clear. Aortic Atherosclerosis (ICD10-I70.0). Electronically Signed   By: Lowella Grip III M.D.   On: 07/17/2020 11:53    Procedures Procedures   Medications Ordered in ED Medications - No data to display  ED Course  I have reviewed the triage vital signs and the nursing notes.  Pertinent labs & imaging results that were available during my care of the patient were reviewed by me and considered in my medical decision  making (see chart for details).  Clinical Course as of 07/17/20 1422  Thu Jul 17, 2020  1110 I spoke with case management who will see and assess the patient for possible SNF placement. PT c/s placed. [AW]  1401 I spoke with Dr. Moshe Cipro of nephrology.  She thinks that his renal failure is likely secondary to dehydration.  She agrees with IV fluid hydration and recommends [facility].  She will follow along. [AW]  1422 I spoke with [facility] who will admit the patient. [AW]    Clinical Course User Index [AW] Arnaldo Natal, MD   MDM Rules/Calculators/A&P                          Douglas Villa presented to the ED with presyncope and diffuse weakness.  He has had a poor appetite since being discharged from the hospital after an STEMI.  As a result, he has become dehydrated, and his chronic kidney disease is now acute renal failure.  I did speak with nephrology, and the thought is that the patient needs a trial of IV hydration and may be able to avoid dialysis.  He will be admitted for ongoing care.  As discussed above, case management is on board with evaluating the patient for other social determinants that are affecting his ability to care for himself. Final Clinical Impression(s) / ED Diagnoses Final diagnoses:  AKI (acute kidney injury) ([place])  Hyperkalemia  Type 2 diabetes mellitus with diabetic nephropathy, without long-term current use of insulin ([place])  Rx / DC Orders ED Discharge Orders     None        Arnaldo Natal, MD 07/17/20 1424

## 2020-07-17 NOTE — ED Notes (Signed)
PT bedside

## 2020-07-18 ENCOUNTER — Encounter (HOSPITAL_COMMUNITY): Payer: Self-pay | Admitting: Internal Medicine

## 2020-07-18 DIAGNOSIS — I480 Paroxysmal atrial fibrillation: Secondary | ICD-10-CM

## 2020-07-18 DIAGNOSIS — T460X1A Poisoning by cardiac-stimulant glycosides and drugs of similar action, accidental (unintentional), initial encounter: Secondary | ICD-10-CM

## 2020-07-18 DIAGNOSIS — E875 Hyperkalemia: Secondary | ICD-10-CM

## 2020-07-18 DIAGNOSIS — N179 Acute kidney failure, unspecified: Secondary | ICD-10-CM

## 2020-07-18 DIAGNOSIS — I5022 Chronic systolic (congestive) heart failure: Secondary | ICD-10-CM

## 2020-07-18 DIAGNOSIS — E1121 Type 2 diabetes mellitus with diabetic nephropathy: Secondary | ICD-10-CM

## 2020-07-18 LAB — BASIC METABOLIC PANEL
Anion gap: 11 (ref 5–15)
BUN: 137 mg/dL — ABNORMAL HIGH (ref 8–23)
CO2: 22 mmol/L (ref 22–32)
Calcium: 8.8 mg/dL — ABNORMAL LOW (ref 8.9–10.3)
Chloride: 99 mmol/L (ref 98–111)
Creatinine, Ser: 4.54 mg/dL — ABNORMAL HIGH (ref 0.61–1.24)
GFR, Estimated: 13 mL/min — ABNORMAL LOW (ref >60)
Glucose, Bld: 149 mg/dL — ABNORMAL HIGH (ref 70–99)
Potassium: 4.3 mmol/L (ref 3.5–5.1)
Sodium: 132 mmol/L — ABNORMAL LOW (ref 135–145)

## 2020-07-18 LAB — GLUCOSE, CAPILLARY
Glucose-Capillary: 93 mg/dL (ref 70–99)
Glucose-Capillary: 122 mg/dL — ABNORMAL HIGH (ref 70–99)
Glucose-Capillary: 176 mg/dL — ABNORMAL HIGH (ref 70–99)

## 2020-07-18 LAB — DIGOXIN LEVEL: Digoxin Level: 3.2 ng/mL (ref 0.8–2.0)

## 2020-07-18 MED ORDER — PANTOPRAZOLE SODIUM 40 MG PO TBEC
40.0000 mg | DELAYED_RELEASE_TABLET | Freq: Every day | ORAL | Status: DC
  Administered 2020-07-18 – 2020-07-23 (×6): 40 mg via ORAL
  Filled 2020-07-18 (×5): qty 1

## 2020-07-18 MED ORDER — SODIUM CHLORIDE 0.9 % IV SOLN
2.0000 | Freq: Once | INTRAVENOUS | Status: AC
  Administered 2020-07-18: 2 via INTRAVENOUS
  Filled 2020-07-18: qty 80

## 2020-07-18 NOTE — Progress Notes (Addendum)
Physical Therapy Treatment Patient Details Name: Douglas Villa MRN: [id number] DOB: March 16, 1950 Today's Date: 07/18/2020    History of Present Illness Pt is a 70 y/o male admitted secondary to dizziness, and weakness. Pt with bed bugs upon presentation as well. Found to have AKI and hyperkalemia. PMH includes CHF, CAD, HTN, COPD, OSA, and COPD.    PT Comments    Mobility continues to be limited by symptomatic orthostatic hypotension. See chart below for details. Pt required supervision bed mobility and min guard assist sit to stand. After initial stance, pt returned to sitting EOB to perform LE exercises (in attempt to improve/stabilize BP). Pt remained significantly orthostatic with second standing attempt requiring return to supine. Pt with c/o feeling 'woozy' during stance. Pt also with c/o mild headache following session. Pt feels headache due to not eating breakfast yet. Pt assisted with calling to order breakfast.                                                    BP             HR Supine prior to mobility        110/63         53 Sitting                                      95/68          56 1st standing trial                     77/64         70 Return to sitting                     102/66        56 2nd standing trial                    79/59         68 Return to supine                      95/62        53    Follow Up Recommendations  Other (comment) (TBD pending progress)     Equipment Recommendations  3in1 (PT)    Recommendations for Other Services       Precautions / Restrictions Precautions Precautions: Fall;Other (comment) Precaution Comments: orthostatic    Mobility  Bed Mobility Overal bed mobility: Needs Assistance Bed Mobility: Supine to Sit;Sit to Supine     Supine to sit: Supervision Sit to supine: Supervision   General bed mobility comments: Supervision for safety, +rail    Transfers Overall transfer level: Needs assistance Equipment used:  None Transfers: Sit to/from Stand Sit to Stand: Min guard         General transfer comment: min guard for safety, increased time, +orthostatics with c/o feeling 'woozy'  Ambulation/Gait             General Gait Details: unable due to orthostatic hypotension   Stairs             Wheelchair Mobility    Modified Rankin (Stroke Patients Only)       Balance  Overall balance assessment: Mild deficits observed, not formally tested                                          Cognition Arousal/Alertness: Awake/alert Behavior During Therapy: WFL for tasks assessed/performed Overall Cognitive Status: Within Functional Limits for tasks assessed                                        Exercises General Exercises - Lower Extremity Ankle Circles/Pumps: AROM;Both;20 reps;Seated Long Arc Quad: AROM;Right;Left;10 reps;Seated Hip Flexion/Marching: AROM;Right;Left;10 reps;Seated    General Comments        Pertinent Vitals/Pain Pain Assessment: Faces Faces Pain Scale: Hurts little more Pain Location: headache after mobility Pain Descriptors / Indicators: Headache Pain Intervention(s): Monitored during session;Other (comment) (RN notified. Pt feels it is due to not yet eating breakfast.)    Home Living                      Prior Function            PT Goals (current goals can now be found in the care plan section) Acute Rehab PT Goals Patient Stated Goal: to feel better Progress towards PT goals: Not progressing toward goals - comment (+orthostatics)    Frequency    Min 3X/week      PT Plan Current plan remains appropriate    Co-evaluation              AM-PAC PT "6 Clicks" Mobility   Outcome Measure  Help needed turning from your back to your side while in a flat bed without using bedrails?: None Help needed moving from lying on your back to sitting on the side of a flat bed without using bedrails?:  None Help needed moving to and from a bed to a chair (including a wheelchair)?: A Little Help needed standing up from a chair using your arms (e.g., wheelchair or bedside chair)?: A Little Help needed to walk in hospital room?: A Little Help needed climbing 3-5 steps with a railing? : A Lot 6 Click Score: 19    End of Session   Activity Tolerance: Treatment limited secondary to medical complications (Comment) (+orthostatics) Patient left: in bed;with call bell/phone within reach;with bed alarm set Nurse Communication: Mobility status;Other (comment) (BP, headache) PT Visit Diagnosis: Other abnormalities of gait and mobility (R26.89);Difficulty in walking, not elsewhere classified (R26.2)     Time: 0940-1005 PT Time Calculation (min) (ACUTE ONLY): 25 min  Charges:  $Therapeutic Exercise: 8-22 mins $Therapeutic Activity: 8-22 mins                     Lorrin Goodell, PT  Office # [phone] Pager [phone]    Lorriane Shire 07/18/2020, 10:43 AM

## 2020-07-18 NOTE — Progress Notes (Signed)
PROGRESS NOTE        PATIENT DETAILS Name: Douglas Villa Age: 70 y.o. Sex: male Date of Birth: Aug 03, 1950 Admit Date: 07/17/2020 Admitting Physician Douglas Halt, MD [id number], Douglas Comfort, MD  Brief Narrative: Patient is a 70 y.o. male with history of recent STEMI-s/p PCI of distal LAD on 5/20, HFrEF, atrial flutter, CKD stage IV-who was seen at CHF clinic yesterday-and was found to be profoundly weak, orthostatic-he was subsequently referred to the ED where he was found to have acute kidney injury.  Significant events: 6/16>> admit for AKI 6/17>> digoxin level 3.2  Significant studies: 6/16>> CXR: No pneumonia  Antimicrobial therapy: None  Microbiology data: 6/16>> COVID/influenza PCR: Negative  Procedures : None  Consults: Cardiology, nephrology  DVT Prophylaxis : apixaban (ELIQUIS) tablet 5 mg    Subjective: Had 1 episode of vomiting yesterday-some nausea but feels better.  Denies any diarrhea.   Assessment/Plan: AKI on CKD stage IV: AKI hemodynamically mediated-improving with supportive care-continue to avoid nephrotoxic agents-remains on IV fluids-nephrology following.  Hyperkalemia: Resolved.  Digoxin toxicity: Some nausea-episode of vomiting-very weak-with renal failure-remains in flutter with rate in the 50s-evaluated by CHF team with plans to start DigiFab.  HFrEF: Volume status stable-in fact was on the dry side yesterday and given IV fluids.  Watch closely.  CAD-s/p recent STEMI requiring PCI to distal LAD: Remains on aspirin/Plavix/statin-not on beta-blocker per cardiology-due to low flow state.  Atrial flutter: Remains in atrial flutter this morning-rate controlled-digoxin levels supratherapeutic-amiodarone held on admission-remains anticoagulated with Eliquis.  Await further input from cardiology  HTN: BP currently stable-not on any antihypertensives currently-as was orthostatic and clinically dry.  DM-2 (A1C 7.7  on 5/21): CBG stable-continue SSI-oral hypoglycemics remain on hold  Diet: Diet Order             Diet renal/carb modified with fluid restriction Diet-HS Snack? Nothing; Fluid restriction: 1200 mL Fluid; Room service appropriate? Yes; Fluid consistency: Thin  Diet effective now                    Code Status: Full code  Family Communication: None at bedside   Disposition Plan: Status is: Inpatient  Remains inpatient appropriate because:Inpatient level of care appropriate due to severity of illness  Dispo: The patient is from: Home              Anticipated d/c is to: Home              Patient currently is not medically stable to d/c.   Difficult to place patient No     Barriers to Discharge: Improving renal function-noted at baseline-digoxin toxicity-requiring DigiFab.  Antimicrobial agents: Anti-infectives (From admission, onward)    None        Time spent: 45 minutes-Greater than 50% of this time was spent in counseling, explanation of diagnosis, planning of further management, and coordination of care.  MEDICATIONS: Scheduled Meds:  apixaban  5 mg Oral BID   aspirin EC  81 mg Oral Daily   atorvastatin  80 mg Oral Daily   clopidogrel  75 mg Oral Daily   insulin aspart  0-9 Units Subcutaneous TID WC   pantoprazole  40 mg Oral Q1200   sodium chloride flush  3 mL Intravenous Q12H   Continuous Infusions:  sodium chloride 125 mL/hr at 07/18/20 0335   sodium chloride  digoxin immune fab (DIGIFAB) IV     PRN Meds:.sodium chloride, acetaminophen, albuterol, hydrALAZINE, ondansetron (ZOFRAN) IV, polyvinyl alcohol, sodium chloride flush   PHYSICAL EXAM: Vital signs: Vitals:   07/18/20 0400 07/18/20 0500 07/18/20 0730 07/18/20 1135  BP: 115/71  109/65 103/65  Pulse: (!) 50  (!) 53 (!) 52  Resp: 17  18 15   Temp: 98.8 F (37.1 C)  98 F (36.7 C) 97.6 F (36.4 C)  TempSrc: Oral  Oral Axillary  SpO2: 97% 98% 97% 97%  Weight:      Height:        Filed Weights   07/17/20 1027 07/18/20 0300  Weight: 76 kg 71.9 kg   Body mass index is 24.83 kg/m.   Gen Exam:Alert awake-not in any distress HEENT:atraumatic, normocephalic Chest: B/L clear to auscultation anteriorly CVS:S1S2 regular Abdomen:soft non tender, non distended Extremities:no edema Neurology: Non focal Skin: no rash  I have personally reviewed following labs and imaging studies  LABORATORY DATA: CBC: Recent Labs  Lab 07/17/20 1238  WBC 9.9  NEUTROABS 8.8*  HGB 15.7  HCT 47.8  MCV 85.7  PLT 382    Basic Metabolic Panel: Recent Labs  Lab 07/17/20 1238 07/17/20 2117 07/18/20 0034  NA 132* 133* 132*  K 6.0* 5.3* 4.3  CL 95* 95* 99  CO2 23 25 22   GLUCOSE 166* 220* 149*  BUN 146* 143* 137*  CREATININE 5.47* 5.00* 4.54*  CALCIUM 9.9 9.1 8.8*    GFR: Estimated Creatinine Clearance: 13.8 mL/min (A) (by C-G formula based on SCr of 4.54 mg/dL (H)).  Liver Function Tests: No results for input(s): AST, ALT, ALKPHOS, BILITOT, PROT, ALBUMIN in the last 168 hours. No results for input(s): LIPASE, AMYLASE in the last 168 hours. No results for input(s): AMMONIA in the last 168 hours.  Coagulation Profile: No results for input(s): INR, PROTIME in the last 168 hours.  Cardiac Enzymes: No results for input(s): CKTOTAL, CKMB, CKMBINDEX, TROPONINI in the last 168 hours.  BNP (last 3 results) No results for input(s): PROBNP in the last 8760 hours.  Lipid Profile: No results for input(s): CHOL, HDL, LDLCALC, TRIG, CHOLHDL, LDLDIRECT in the last 72 hours.  Thyroid Function Tests: No results for input(s): TSH, T4TOTAL, FREET4, T3FREE, THYROIDAB in the last 72 hours.  Anemia Panel: No results for input(s): VITAMINB12, FOLATE, FERRITIN, TIBC, IRON, RETICCTPCT in the last 72 hours.  Urine analysis: No results found for: COLORURINE, APPEARANCEUR, LABSPEC, PHURINE, GLUCOSEU, HGBUR, BILIRUBINUR, KETONESUR, PROTEINUR, UROBILINOGEN, NITRITE,  LEUKOCYTESUR  Sepsis Labs: Lactic Acid, Venous    Component Value Date/Time   LATICACIDVEN 1.3 06/20/2020 1433    MICROBIOLOGY: Recent Results (from the past 240 hour(s))  Resp Panel by RT-PCR (Flu A&B, Covid) Nasopharyngeal Swab     Status: None   Collection Time: 07/17/20  6:29 PM   Specimen: Nasopharyngeal Swab; Nasopharyngeal(NP) swabs in vial transport medium  Result Value Ref Range Status   SARS Coronavirus 2 by RT PCR NEGATIVE NEGATIVE Final    Comment: (NOTE) SARS-CoV-2 target nucleic acids are NOT DETECTED.  The SARS-CoV-2 RNA is generally detectable in upper respiratory specimens during the acute phase of infection. The lowest concentration of SARS-CoV-2 viral copies this assay can detect is 138 copies/mL. A negative result does not preclude SARS-Cov-2 infection and should not be used as the sole basis for treatment or other patient management decisions. A negative result may occur with  improper specimen collection/handling, submission of specimen other than nasopharyngeal swab, presence of viral mutation(s) within the  areas targeted by this assay, and inadequate number of viral copies(<138 copies/mL). A negative result must be combined with clinical observations, patient history, and epidemiological information. The expected result is Negative.  Fact Sheet for Patients:  EntrepreneurPulse.com.au  Fact Sheet for Healthcare Providers:  IncredibleEmployment.be  This test is no t yet approved or cleared by the [place] FDA and  has been authorized for detection and/or diagnosis of SARS-CoV-2 by FDA under an Emergency Use Authorization (EUA). This EUA will remain  in effect (meaning this test can be used) for the duration of the COVID-19 declaration under Section 564(b)(1) of the Act, 21 U.S.C.section 360bbb-3(b)(1), unless the authorization is terminated  or revoked sooner.       Influenza A by PCR NEGATIVE NEGATIVE Final    Influenza B by PCR NEGATIVE NEGATIVE Final    Comment: (NOTE) The Xpert Xpress SARS-CoV-2/FLU/RSV plus assay is intended as an aid in the diagnosis of influenza from Nasopharyngeal swab specimens and should not be used as a sole basis for treatment. Nasal washings and aspirates are unacceptable for Xpert Xpress SARS-CoV-2/FLU/RSV testing.  Fact Sheet for Patients: EntrepreneurPulse.com.au  Fact Sheet for Healthcare Providers: IncredibleEmployment.be  This test is not yet approved or cleared by the [place] FDA and has been authorized for detection and/or diagnosis of SARS-CoV-2 by FDA under an Emergency Use Authorization (EUA). This EUA will remain in effect (meaning this test can be used) for the duration of the COVID-19 declaration under Section 564(b)(1) of the Act, 21 U.S.C. section 360bbb-3(b)(1), unless the authorization is terminated or revoked.  Performed at [facility], [place] [address] [place], [place] [address]     RADIOLOGY STUDIES/RESULTS: US RENAL  Result Date: 07/17/2020 CLINICAL DATA:  Acute kidney injury EXAM: RENAL / URINARY TRACT ULTRASOUND COMPLETE COMPARISON:  None. FINDINGS: Right Kidney: Renal measurements: 7.9 x 4.3 x 5.1 cm = volume: 88.6 mL. Echogenicity within normal limits. No hydronephrosis. Cyst in the upper pole measuring 2.8 x 2.5 x 2.2 cm. Cyst in the lower pole measuring 1.2 x 1.5 x 1 cm. Left Kidney: Renal measurements: 6.9 x 5.9 x 4.2 cm = volume: 88 mL. Suspect length measurement under estimated due to presence of lower pole cyst. Echogenicity within normal limits. No hydronephrosis. Cyst at the lower pole measuring 4.3 x 4.3 x 4.8 cm. Bladder: Appears normal for degree of bladder distention. Other: None. IMPRESSION: 1. Negative for hydronephrosis. 2. Simple appearing bilateral renal cysts Electronically Signed   By: Donavan Foil M.D.   On: 07/17/2020 15:52   DG Chest Port 1 View  Result Date:  07/17/2020 CLINICAL DATA:  Weakness EXAM: PORTABLE CHEST 1 VIEW COMPARISON:  Jun 21, 2020 FINDINGS: The lungs are clear. Heart is mildly enlarged with pulmonary vascularity normal. No adenopathy. No pneumothorax. No bone lesions. There is aortic atherosclerosis. IMPRESSION: Mild cardiomegaly. Lungs clear. Aortic Atherosclerosis (ICD10-I70.0). Electronically Signed   By: Lowella Grip III M.D.   On: 07/17/2020 11:53     LOS: 1 day   Oren Binet, MD  [facility]    To contact the attending provider between 7A-7P or the covering provider during after hours 7P-7A, please log into the web site [email or web address] and access using universal [place] password for that web site. If you do not have the password, please call the hospital operator.  07/18/2020, 11:51 AM

## 2020-07-18 NOTE — Progress Notes (Signed)
Patient is currently admitted in [facility]

## 2020-07-18 NOTE — Evaluation (Signed)
Occupational Therapy Evaluation Patient Details Name: Douglas Villa MRN: [id number] DOB: May 26, 1950 Today's Date: 07/18/2020    History of Present Illness 70 y/o male admitted secondary to dizziness, and weakness. Pt with bed bugs upon presentation as well. Found to have AKI and hyperkalemia. PMH includes CHF, CAD, HTN, COPD, OSA, and COPD.   Clinical Impression   PTA, pt was independent and lived alone in a hotel. Currently, pt is significantly limited by decreased activity tolerance, feeling dizzy, and heaviness in legs. Pt requiring min guard for safety during sit<>stand transfers and standing ADLs. Pt completing marching exercise in standing and requiring 2 rest breaks during session. Recommend OT at SNF and will continue to follow acutely to optimize activity tolerance and independence in ADLs.   Orthostatic BPs   Supine 112/64 (79)  Sitting at EOB 102/68 (80)  Standing  102/69 (81)  EOB  101/78 (87)  EOB  110/69 (82)  Supine  112/67 (79)       Follow Up Recommendations  SNF (Pending pt progress)    Equipment Recommendations  None recommended by OT    Recommendations for Other Services       Precautions / Restrictions Precautions Precautions: Fall;Other (comment) Precaution Comments: orthostatic Restrictions Weight Bearing Restrictions: No      Mobility Bed Mobility Overal bed mobility: Needs Assistance Bed Mobility: Supine to Sit;Sit to Supine     Supine to sit: Supervision Sit to supine: Supervision   General bed mobility comments: Supervision for safety, +rail    Transfers Overall transfer level: Needs assistance Equipment used: None Transfers: Sit to/from Stand Sit to Stand: Min guard         General transfer comment: min guard for safety, increased time, +orthostatics with c/o feeling dizzy and leg heaviness    Balance Overall balance assessment: Mild deficits observed, not formally tested                                          ADL either performed or assessed with clinical judgement   ADL Overall ADL's : Needs assistance/impaired Eating/Feeding: Sitting;Supervision/ safety;Set up   Grooming: Supervision/safety;Sitting   Upper Body Bathing: Supervision/ safety;Set up;Sitting   Lower Body Bathing: Sit to/from stand;Min guard   Upper Body Dressing : Supervision/safety;Set up;Sitting   Lower Body Dressing: Min guard;Sit to/from stand   Toilet Transfer: Stand-pivot;BSC;Min guard   Toileting- Water quality scientist and Hygiene: Sit to/from stand;Min guard         General ADL Comments: Pt limited by activity tolerance, requiring multiple rest breaks during session. Able to take side steps to move higher in bed, but declining amblation to sink due to feeling unwell and fear of falling.     Vision Baseline Vision/History: Wears glasses Wears Glasses: Reading only Vision Assessment?: No apparent visual deficits Additional Comments: Pt reporting no changes in vision     Perception Perception Perception Tested?: No Comments: No apparent difficulty with perception   Praxis Praxis Praxis tested?: Not tested Praxis-Other Comments: No apparent difficulty with motor planning.    Pertinent Vitals/Pain Pain Assessment: Faces Faces Pain Scale: Hurts a little bit Pain Location: legs Pain Descriptors / Indicators: Heaviness Pain Intervention(s): Monitored during session;Limited activity within patient's tolerance     Hand Dominance Right   Extremity/Trunk Assessment Upper Extremity Assessment Upper Extremity Assessment: Generalized weakness   Lower Extremity Assessment Lower Extremity Assessment: Defer to PT evaluation  Cervical / Trunk Assessment Cervical / Trunk Assessment: Normal   Communication Communication Communication: No difficulties   Cognition Arousal/Alertness: Awake/alert Behavior During Therapy: WFL for tasks assessed/performed Overall Cognitive Status: Within Functional  Limits for tasks assessed                                 General Comments: Pt pleasant and conversational throughout. Pt requiring increased time to follow commands due to fear of becoming dizzy. Continue to assess higher level cognition in future sessions.   General Comments  112/64 (79) supine, 102/68 (80) EOB, 102/69 (81) Standing, 101/78 (87) EOB, 110/69 (82) EOB, 112/67 (79) supine. HR 50s. SpO2 90s on RA. Pt with cautious movement, and reporting dizziness and heavy feeling in BLE. Pt appearing to feel weak. Defer functional mobility due to pt report of feeling dizzy and weak.    Exercises Exercises: General Lower Extremity General Exercises - Lower Extremity Hip Flexion/Marching: AROM;Standing;Both;10 reps   Shoulder Instructions      Home Living Family/patient expects to be discharged to:: Other (Comment) (hotel) Living Arrangements: Alone Available Help at Discharge: Friend(s);Available PRN/intermittently Type of Home: Other(Comment) (home) Home Access: Level entry (reporting he has to step up on a curb)     Home Layout: One level     Bathroom Shower/Tub: Teacher, early years/pre: Standard     Home Equipment: None          Prior Functioning/Environment Level of Independence: Independent        Comments: Did report recent difficulty getting into/out of tub since symptoms began        OT Problem List: Decreased strength;Decreased activity tolerance;Decreased safety awareness;Pain      OT Treatment/Interventions: Self-care/ADL training;Therapeutic exercise;DME and/or AE instruction;Therapeutic activities;Patient/family education    OT Goals(Current goals can be found in the care plan section) Acute Rehab OT Goals Patient Stated Goal: to feel better OT Goal Formulation: With patient Time For Goal Achievement: 08/01/20 Potential to Achieve Goals: Good ADL Goals Pt Will Perform Grooming: standing;with supervision Pt Will Perform Lower  Body Dressing: with modified independence;sit to/from stand Pt Will Transfer to Toilet: ambulating;with supervision;regular height toilet Pt Will Perform Toileting - Clothing Manipulation and hygiene: with supervision;sit to/from stand Additional ADL Goal #1: Pt will perform 3 grooming tasks in standing to demonstrate activity tolerance.  OT Frequency: Min 2X/week   Barriers to D/C:            Co-evaluation              AM-PAC OT "6 Clicks" Daily Activity     Outcome Measure Help from another person eating meals?: A Little Help from another person taking care of personal grooming?: A Little Help from another person toileting, which includes using toliet, bedpan, or urinal?: A Little Help from another person bathing (including washing, rinsing, drying)?: A Little Help from another person to put on and taking off regular upper body clothing?: A Little Help from another person to put on and taking off regular lower body clothing?: A Little 6 Click Score: 18   End of Session Equipment Utilized During Treatment: Gait belt;Rolling walker Nurse Communication: Mobility status  Activity Tolerance: Treatment limited secondary to medical complications (Comment) (dizziness and heaviness of BLE) Patient left: in bed;with call bell/phone within reach;with bed alarm set  OT Visit Diagnosis: Unsteadiness on feet (R26.81);Dizziness and giddiness (R42);Pain;Muscle weakness (generalized) (M62.81) Pain - part of body: Leg (  BLE heaviness)                Time: 4239-5320 OT Time Calculation (min): 30 min Charges:  OT General Charges $OT Visit: 1 Visit OT Evaluation $OT Eval Moderate Complexity: 1 Mod OT Treatments $Self Care/Home Management : 8-22 mins  Shanda Howells, OTDS   Shanda Howells 07/18/2020, 4:34 PM

## 2020-07-18 NOTE — Progress Notes (Signed)
[facility] Progress Note    Assessment/ Plan:    1.AKI on CKD IV- pt has some CKD dating back to 2019- possibly due to DM or cardiorenal.  Has small kidney sizes on imaging.  This most recent change from 5/29 to today seems to be due to volume depletion and hypotension that was iatrogenic from meds.  - continue hydration- will do for one more day and stop tomorrow 6/18 at 0800 - hold BP meds and Farxiga - expect continued improvement with IVF resuscitation - no hydro on renal US, some simple cysts  2. Hypertension/volume  - volume depleted and hypotensive-  holding BP meds and gently hydrating  3. Hyperkalemia -  on aldactone and potassium as OP-  hold, now WNL  Subjective:    Seen in room. Cr improving with hydration.  Was orthostatic this AM and dizzy- BP down to 70s/50s he says and this is confirmed on the monitor.     Objective:   BP 103/65 (BP Location: Right Arm)   Pulse (!) 52   Temp 97.6 F (36.4 C) (Axillary)   Resp 15   Ht 5\' 7"  (1.702 m)   Wt 71.9 kg   SpO2 97%   BMI 24.83 kg/m   Intake/Output Summary (Last 24 hours) at 07/18/2020 1139 Last data filed at 07/18/2020 9622 Gross per 24 hour  Intake 490 ml  Output 800 ml  Net -310 ml   Weight change:   Physical Exam: Gen: NAD CVS: RRR Resp: clear Abd: soft Ext: no LE at all  Imaging: US RENAL  Result Date: 07/17/2020 CLINICAL DATA:  Acute kidney injury EXAM: RENAL / URINARY TRACT ULTRASOUND COMPLETE COMPARISON:  None. FINDINGS: Right Kidney: Renal measurements: 7.9 x 4.3 x 5.1 cm = volume: 88.6 mL. Echogenicity within normal limits. No hydronephrosis. Cyst in the upper pole measuring 2.8 x 2.5 x 2.2 cm. Cyst in the lower pole measuring 1.2 x 1.5 x 1 cm. Left Kidney: Renal measurements: 6.9 x 5.9 x 4.2 cm = volume: 88 mL. Suspect length measurement under estimated due to presence of lower pole cyst. Echogenicity within normal limits. No hydronephrosis. Cyst at the lower pole measuring 4.3 x 4.3  x 4.8 cm. Bladder: Appears normal for degree of bladder distention. Other: None. IMPRESSION: 1. Negative for hydronephrosis. 2. Simple appearing bilateral renal cysts Electronically Signed   By: Donavan Foil M.D.   On: 07/17/2020 15:52   DG Chest Port 1 View  Result Date: 07/17/2020 CLINICAL DATA:  Weakness EXAM: PORTABLE CHEST 1 VIEW COMPARISON:  Jun 21, 2020 FINDINGS: The lungs are clear. Heart is mildly enlarged with pulmonary vascularity normal. No adenopathy. No pneumothorax. No bone lesions. There is aortic atherosclerosis. IMPRESSION: Mild cardiomegaly. Lungs clear. Aortic Atherosclerosis (ICD10-I70.0). Electronically Signed   By: Lowella Grip III M.D.   On: 07/17/2020 11:53    Labs: BMET Recent Labs  Lab 07/17/20 1238 07/17/20 2117 07/18/20 0034  NA 132* 133* 132*  K 6.0* 5.3* 4.3  CL 95* 95* 99  CO2 23 25 22   GLUCOSE 166* 220* 149*  BUN 146* 143* 137*  CREATININE 5.47* 5.00* 4.54*  CALCIUM 9.9 9.1 8.8*   CBC Recent Labs  Lab 07/17/20 1238  WBC 9.9  NEUTROABS 8.8*  HGB 15.7  HCT 47.8  MCV 85.7  PLT 303    Medications:     apixaban  5 mg Oral BID   aspirin EC  81 mg Oral Daily   atorvastatin  80 mg Oral Daily  clopidogrel  75 mg Oral Daily   insulin aspart  0-9 Units Subcutaneous TID WC   pantoprazole  40 mg Oral Q1200   sodium chloride flush  3 mL Intravenous Q12H      Madelon Lips MD 07/18/2020, 11:39 AM

## 2020-07-18 NOTE — Plan of Care (Signed)
  Problem: Education: Goal: Ability to demonstrate management of disease process will improve Outcome: Progressing Goal: Ability to verbalize understanding of medication therapies will improve Outcome: Progressing Goal: Individualized Educational Video(s) Outcome: Progressing   Problem: Activity: Goal: Capacity to carry out activities will improve Outcome: Progressing   

## 2020-07-18 NOTE — Consult Note (Addendum)
Advanced Heart Failure Team Consult Note   Primary Physician: Abigail Miyamoto, MD PCP-Cardiologist:  Dr. Shirlee Latch   Reason for Consultation: Medication Assistance, Digoxin toxicity   HPI:    Douglas Villa is seen today for evaluation of medication assistance/ digoxin toxicity at the request of Dr. Jerral Ralph, Internal Medicine.   70 y/o male w/ h/o CAD, cardiomyopathy, HTN, and smoking (quit smoking in 2/19) with COPD.  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  He was admitted to a hospital there with CHF symptoms.  He had had one prior episode of CHF in 2016 where he was admitted at [facility] (no records available).  At the hospital in [place], he had LHC showed occluded PLV and 80% stenosis proximal LCx.  EF was 25-35% by LV-gram.  He had DES placed to pLCx.     Echo was done in 6/19.  This showed EF 25-30% with moderate-severe Douglas, moderate-severe TR. He saw Dr. Graciela Husbands for ICD consideration, given frequent PVCs a holter was placed in 9/19 => 2% PVCs.  CPX was done in 9/19 showing moderate functional impairment from heart failure. Repeat echo in 1/20 showed EF 20-25% with severe LVH, mild Douglas, normal RV.   He continues to smoke 1 ppd.  He gets short of breath with heavy lifting/carrying as well as doing yardwork on a hot day, dyspnea mildly worse recently.  No dyspnea walking on flat ground.  No chest pain.  No claudication.  He has left 1st MTP joint pain, the joint is warm as well. No edema.  Weight is up about 6 lbs. Douglas Villa is a 70 year old with a history of CAD, chronic systolic heart failure, HTN, COPD, OSA, and smoking (quit smoking in 2/19) with COPD.     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  He was admitted to a hospital there with CHF symptoms.  He had had one prior episode of CHF in 2016 where he was admitted at [facility] (no records available).  At the hospital in [place], he had LHC showed occluded PLV and 80% stenosis  proximal LCx.  EF was 25-35% by LV-gram.  He had DES placed to pLCx.     Echo was done in 6/19.  This showed EF 25-30% with moderate-severe Douglas, moderate-severe TR. He saw Dr. Graciela Husbands for ICD consideration, given frequent PVCs a holter was placed in 9/19 => 2% PVCs.  CPX was done in 9/19 showing moderate functional impairment from heart failure. Repeat echo in 1/20 showed EF 20-25% with severe LVH, mild Douglas, normal RV.    He had been followed by Dr Shirlee Latch and had a virtual visit back to 2020. He has not been seen since that time because he was concerned about COVID. He has been of meds for some time.   He was sent to the ED from PCP office 5/22 for volume overload. Echo showed an EF at 30%. Started on IV Lasix. EKG showed anterior lead STE and he was transferred to [facility] for emergent LHC and shock team evaluation.   LHC 06/20/2020 showed distally occluded LAD with PCI placement. IABP was placed due to cardiogenic shock. Swan numbers suggestive of severe biventricular failure/shock. Echo with LVEF 15% RV severely decreased with severe TR and moderate to severe Douglas. No VSD. Placed on milrinone, NE, IV Lasix. He was noted to be in new onset atrial fibrillation. TEE/DCCV performed 06/27/20 to NSR with no complication.   Given anterior MI  and LVEF <35%, Dr. Aundra Dubin recommended pt be discharged home w/ LifeVest for primary prevention, however pt declined, citing he wore a LifeVest several years ago when he lived in [place] and could not tolerate wearing it. He was informed of indication and risk of being discharged w/o protection. He understood the risk and still declined LifeVest. He was discharged on GDMT, weight 171 lbs.  He was seen in [facility] yesterday for post hospital f/u and was volume depleted and symptomatic w/ extreme weakness/ fatigue, generalized malaise, n/v and poor PO intake.  Wt was down 4 lb. He had been taking all of his prescribed HF meds except for Farxiga and Bidil (did not get from pharmacy due to  expense).  He was too weak to stand and orthostatic. SBPs 70s-80s. Appeared malnourished and found to have bedbugs. Apparently, has been staying in a hotel.   He was sent to the ED and labs showed AKI, SCr bumped to 5.5 (prior baseline 1.9). Hyperkalemic w/ K of 6.0. Dig level elevated at 2.5. EKG showed AFL 93 bpm.   He was admitted by IM. Diuretics/ BP active meds + digoxin held. Insulin, Lokelma + calcium gluconate given for hyperkalemia, K 6.0>>5.3>>4.3. Hydrated w/ IVFs. SCr improving, down 5.5>>5.0>>4.5. Dig level higher today, 2.5>>3.2. Tele shows AFL w/ SVR 40s-50s. BP remains soft but overall improved, 14E systolic. Reports dizziness w/ standing. N/V resolved today. He denies CP and dyspnea. AHF team consulted to assist w/ medication management/ elevated dig level.      Cardiac Studies: Echo (5/22) in cath lab with severe biventricular heart failure. EF < 20%.    Cath 5/22 Occluded distal LAD-->PCI.   IABP placed  Cath  RA 29 PWP 28 CO 2.2 CI 1.15 CPO .29 PAPi 1.0  Ost LAD to Prox LAD lesion is 20% stenosed. Mid LAD lesion is 20% stenosed. Dist LAD lesion is 100% stenosed. Ost RCA to Prox RCA lesion is 20% stenosed. Previously placed 1st Mrg stent (unknown type) is widely patent.     Review of Systems: [y] = yes, [ ]  = no   General: Weight gain [ ] ; Weight loss [ ] ; Anorexia [ Y]; Fatigue [ Y]; Fever [ ] ; Chills [ ] ; Weakness [ Y]  Cardiac: Chest pain/pressure [ ] ; Resting SOB [ ] ; Exertional SOB [ ] ; Orthopnea [ ] ; Pedal Edema [ ] ; Palpitations [ ] ; Syncope [ ] ; Presyncope [ Y]; Paroxysmal nocturnal dyspnea[ ]   Pulmonary: Cough [ ] ; Wheezing[ ] ; Hemoptysis[ ] ; Sputum [ ] ; Snoring [ ]   GI: Vomiting[ Y]; Dysphagia[ ] ; Melena[ ] ; Hematochezia [ ] ; Heartburn[ ] ; Abdominal pain [ ] ; Constipation [ Y]; Diarrhea [ ] ; BRBPR [ ]   GU: Hematuria[ ] ; Dysuria [ ] ; Nocturia[ ]   Vascular: Pain in legs with walking [ ] ; Pain in feet with lying flat [ ] ; Non-healing sores [ ] ; Stroke [ ] ;  TIA [ ] ; Slurred speech [ ] ;  Neuro: Headaches[ ] ; Vertigo[ ] ; Seizures[ ] ; Paresthesias[ ] ;Blurred vision [ ] ; Diplopia [ ] ; Vision changes [ ]   Ortho/Skin: Arthritis [ ] ; Joint pain [ ] ; Muscle pain [ ] ; Joint swelling [ ] ; Back Pain [ ] ; Rash [ ]   Psych: Depression[ ] ; Anxiety[ ]   Heme: Bleeding problems [ ] ; Clotting disorders [ ] ; Anemia [ ]   Endocrine: Diabetes [ ] ; Thyroid dysfunction[ ]   Home Medications Prior to Admission medications   Medication Sig Start Date End Date Taking? Authorizing Provider  albuterol (PROVENTIL HFA;VENTOLIN HFA) 108 (90 Base) MCG/ACT inhaler Inhale 2  puffs into the lungs every 6 (six) hours as needed for wheezing or shortness of breath. 08/05/17  Yes Lauraine Rinne, NP  ACCU-CHEK FASTCLIX LANCETS MISC by Does not apply route.    [provider]  amiodarone (PACERONE) 200 MG tablet Take 1 tablet (200 mg total) by mouth 2 (two) times daily. 06/29/20   Tommie Raymond, NP  apixaban (ELIQUIS) 5 MG TABS tablet Take 1 tablet (5 mg total) by mouth 2 (two) times daily. 06/29/20   Kathyrn Drown D, NP  aspirin 81 MG tablet Take 1 tablet (81 mg total) by mouth daily. Patient not taking: No sig reported 09/02/17   Larey Dresser, MD  atorvastatin (LIPITOR) 80 MG tablet Take 1 tablet (80 mg total) by mouth daily. Needs appt for future refills Patient not taking: No sig reported 01/09/20   Larey Dresser, MD  Blood Glucose Monitoring Suppl (ACCU-CHEK AVIVA PLUS) w/Device KIT by Does not apply route.    [provider]  clopidogrel (PLAVIX) 75 MG tablet TAKE 1 TABLET BY MOUTH ONCE DAILY . APPOINTMENT REQUIRED FOR FUTURE REFILLS Patient taking differently: Take 75 mg by mouth daily. 01/09/20   Larey Dresser, MD  dapagliflozin propanediol (FARXIGA) 10 MG TABS tablet Take 1 tablet (10 mg total) by mouth daily. Patient not taking: No sig reported 06/30/20   Kathyrn Drown D, NP  digoxin (LANOXIN) 0.125 MG tablet Take 1 tablet (0.125 mg total) by mouth daily.  06/30/20   Kathyrn Drown D, NP  glucose blood test strip 1 each by Other route as needed. Use as instructed    [provider]  hydrALAZINE (APRESOLINE) 25 MG tablet TAKE 1 & 1/2 (ONE & ONE-HALF) TABLETS BY MOUTH THREE TIMES DAILY. NEED AN APPOINTMENT FOR FUTURE REFILLS Patient taking differently: Take 37.5 mg by mouth 3 (three) times daily. 04/04/20   Larey Dresser, MD  isosorbide mononitrate (IMDUR) 30 MG 24 hr tablet Take 1 tablet (30 mg total) by mouth daily. Needs appt for future refills Patient not taking: No sig reported 01/09/20   Larey Dresser, MD  nitroGLYCERIN (NITROSTAT) 0.4 MG SL tablet Place 0.4 mg under the tongue every 5 (five) minutes as needed for chest pain.    [provider]  Polyethylene Glycol 400 (VISINE DRY EYE RELIEF OP) Place 1 drop into both eyes daily as needed (For dry eyes).    [provider]  potassium chloride SA (KLOR-CON) 20 MEQ tablet Take 1 tablet (20 mEq total) by mouth daily. 06/29/20   Kathyrn Drown D, NP  spironolactone (ALDACTONE) 25 MG tablet Take 1 tablet by mouth once daily Patient taking differently: Take 25 mg by mouth daily. 09/24/19   Larey Dresser, MD  torsemide 40 MG TABS Take 40 mg by mouth daily. 06/29/20   Tommie Raymond, NP    Past Medical History: Past Medical History:  Diagnosis Date   Abnormal EKG 03/2009   CAD (coronary artery disease)    CKD (chronic kidney disease)    Heart failure with preserved left ventricular function (HFpEF) ([place])    Hypertension    OSA (obstructive sleep apnea)    Sexual dysfunction    Type 2 diabetes mellitus with diabetic nephropathy ([place]) 06/18/2020    Past Surgical History: Past Surgical History:  Procedure Laterality Date   APPENDECTOMY  1969   ARTERIAL LINE INSERTION N/A 06/20/2020   Procedure: ARTERIAL LINE INSERTION;  Surgeon: Troy Sine, MD;  Location: [place] [facility];  Service: Cardiovascular;  Laterality: N/A;   CARDIAC CATHETERIZATION      CARDIOVERSION N/A 06/27/2020   Procedure: CARDIOVERSION;  Surgeon: Larey Dresser, MD;  Location: [facility] ENDOSCOPY;  Service: Cardiovascular;  Laterality: N/A;   CENTRAL LINE INSERTION  06/20/2020   Procedure: CENTRAL LINE INSERTION;  Surgeon: Troy Sine, MD;  Location: [place] [facility];  Service: Cardiovascular;;   CORONARY ANGIOPLASTY     CORONARY/GRAFT ACUTE MI REVASCULARIZATION N/A 06/20/2020   Procedure: Coronary/Graft Acute MI Revascularization;  Surgeon: Troy Sine, MD;  Location: [place] [facility];  Service: Cardiovascular;  Laterality: N/A;   IABP INSERTION N/A 06/20/2020   Procedure: IABP Insertion;  Surgeon: Troy Sine, MD;  Location: [place] [facility];  Service: Cardiovascular;  Laterality: N/A;   RIGHT/LEFT HEART CATH AND CORONARY ANGIOGRAPHY N/A 06/20/2020   Procedure: RIGHT/LEFT HEART CATH AND CORONARY ANGIOGRAPHY;  Surgeon: Troy Sine, MD;  Location: [place] [facility];  Service: Cardiovascular;  Laterality: N/A;   TEE WITHOUT CARDIOVERSION N/A 06/27/2020   Procedure: TRANSESOPHAGEAL ECHOCARDIOGRAM (TEE);  Surgeon: Larey Dresser, MD;  Location: [facility] ENDOSCOPY;  Service: Cardiovascular;  Laterality: N/A;    Family History: Family History  Problem Relation Age of Onset   Cancer Mother 55   Stroke Father 69    Social History: Social History   Socioeconomic History   Marital status: Married    Spouse name: Not on file   Number of children: Not on file   Years of education: Not on file   Highest education level: Not on file  Occupational History   Not on file  Tobacco Use   Smoking status: Former    Packs/day: 1.00    Years: 45.00    Pack years: 45.00    Types: Cigarettes    Quit date: 2020    Years since quitting: 2.4   Smokeless tobacco: Never  Vaping Use   Vaping Use: Never used  Substance and Sexual Activity   Alcohol use: No   Drug use: No   Sexual activity: Not Currently  Other Topics Concern   Not on file  Social History Narrative    Not on file   Social Determinants of Health   Financial Resource Strain: Medium Risk   Difficulty of Paying Living Expenses: Somewhat hard  Food Insecurity: Food Insecurity Present   Worried About Charity fundraiser in the Last Year: Never true   Ran Out of Food in the Last Year: Sometimes true  Transportation Needs: No Transportation Needs   Lack of Transportation (Medical): No   Lack of Transportation (Non-Medical): No  Physical Activity: Not on file  Stress: Not on file  Social Connections: Not on file    Allergies:  Allergies  Allergen Reactions   Penicillins Other (See Comments)    Sweating     Objective:    Vital Signs:   Temp:  [97.6 F (36.4 C)-98.8 F (37.1 C)] 97.6 F (36.4 C) (06/17 1135) Pulse Rate:  [50-76] 52 (06/17 1135) Resp:  [14-22] 15 (06/17 1135) BP: (98-123)/(62-83) 103/65 (06/17 1135) SpO2:  [97 %-100 %] 97 % (06/17 1135) Weight:  [71.9 kg] 71.9 kg (06/17 0300) Last BM Date: 07/16/20  Weight change: Filed Weights   07/17/20 1027 07/18/20 0300  Weight: 76 kg 71.9 kg    Intake/Output:   Intake/Output Summary (Last 24 hours) at 07/18/2020 1137 Last data filed at 07/18/2020 0733 Gross per 24 hour  Intake 490 ml  Output 800 ml  Net -310 ml      Physical Exam    General:  mildly fatigued appearing. No resp difficulty HEENT: normal Neck: supple. JVP flat . Carotids 2+ bilat; no bruits. No lymphadenopathy or thyromegaly appreciated. Cor: PMI nondisplaced. Irregular rhythm, slow rate. No rubs, gallops or murmurs. Lungs: clear Abdomen: soft, nontender, nondistended. No hepatosplenomegaly. No bruits or masses. Good bowel sounds. Extremities: no cyanosis, clubbing, rash, edema Neuro: alert & orientedx3, cranial nerves grossly intact. moves all 4 extremities w/o difficulty. Affect pleasant   Telemetry   ALF mid 40s-50s   EKG    3:1 AFL 55 bpm ? Competing junctional rhythm   Labs   Basic Metabolic Panel: Recent Labs  Lab  07/17/20 1238 07/17/20 2117 07/18/20 0034  NA 132* 133* 132*  K 6.0* 5.3* 4.3  CL 95* 95* 99  CO2 $Re'23 25 22  'yHs$ GLUCOSE 166* 220* 149*  BUN 146* 143* 137*  CREATININE 5.47* 5.00* 4.54*  CALCIUM 9.9 9.1 8.8*    Liver Function Tests: No results for input(s): AST, ALT, ALKPHOS, BILITOT, PROT, ALBUMIN in the last 168 hours. No results for input(s): LIPASE, AMYLASE in the last 168 hours. No results for input(s): AMMONIA in the last 168 hours.  CBC: Recent Labs  Lab 07/17/20 1238  WBC 9.9  NEUTROABS 8.8*  HGB 15.7  HCT 47.8  MCV 85.7  PLT 303    Cardiac Enzymes: No results for input(s): CKTOTAL, CKMB, CKMBINDEX, TROPONINI in the last 168 hours.  BNP: BNP (last 3 results) Recent Labs    07/17/20 1238  BNP 391.9*    ProBNP (last 3 results) No results for input(s): PROBNP in the last 8760 hours.   CBG: Recent Labs  Lab 07/17/20 1832 07/17/20 2010 07/18/20 0730 07/18/20 1131  GLUCAP 80 182* 93 122*    Coagulation Studies: No results for input(s): LABPROT, INR in the last 72 hours.   Imaging   US RENAL  Result Date: 07/17/2020 CLINICAL DATA:  Acute kidney injury EXAM: RENAL / URINARY TRACT ULTRASOUND COMPLETE COMPARISON:  None. FINDINGS: Right Kidney: Renal measurements: 7.9 x 4.3 x 5.1 cm = volume: 88.6 mL. Echogenicity within normal limits. No hydronephrosis. Cyst in the upper pole measuring 2.8 x 2.5 x 2.2 cm. Cyst in the lower pole measuring 1.2 x 1.5 x 1 cm. Left Kidney: Renal measurements: 6.9 x 5.9 x 4.2 cm = volume: 88 mL. Suspect length measurement under estimated due to presence of lower pole cyst. Echogenicity within normal limits. No hydronephrosis. Cyst at the lower pole measuring 4.3 x 4.3 x 4.8 cm. Bladder: Appears normal for degree of bladder distention. Other: None. IMPRESSION: 1. Negative for hydronephrosis. 2. Simple appearing bilateral renal cysts Electronically Signed   By: Donavan Foil M.D.   On: 07/17/2020 15:52   DG Chest Port 1  View  Result Date: 07/17/2020 CLINICAL DATA:  Weakness EXAM: PORTABLE CHEST 1 VIEW COMPARISON:  Jun 21, 2020 FINDINGS: The lungs are clear. Heart is mildly enlarged with pulmonary vascularity normal. No adenopathy. No pneumothorax. No bone lesions. There is aortic atherosclerosis. IMPRESSION: Mild cardiomegaly. Lungs clear. Aortic Atherosclerosis (ICD10-I70.0). Electronically Signed   By: Lowella Grip III M.D.   On: 07/17/2020 11:53     Medications:     Current Medications:  apixaban  5 mg Oral BID   aspirin EC  81 mg Oral Daily   atorvastatin  80 mg Oral Daily   clopidogrel  75 mg Oral Daily   insulin aspart  0-9 Units Subcutaneous TID  WC   pantoprazole  40 mg Oral Q1200   sodium chloride flush  3 mL Intravenous Q12H    Infusions:  sodium chloride 125 mL/hr at 07/18/20 0335   sodium chloride     digoxin immune fab (DIGIFAB) IV        Assessment/Plan   Elevate Dig Level/ Dig Toxicity   - Dig level 2.5>>3.2, in setting of AKI. Symptomatic w/ GI symptoms + bradycardia, AFL w/ SVR mid 40s-50s ? Transient junctional rhythm. K stable today at 4.3  - d/w pharmD. Treat w/ digoxin immune fab.  - continuous telemetry monitoring  - monitor K, avoid hypokalemia and continue IVFs to correct AKI  - follow dig level for clearance   2. AFlutter: Atypical. Diagnosed previous admit 5/22. S/p TEE/DC-CV on 5/27. Back in AFL, V-rates currently 40s-50s in setting of dig toxicity. BP stable. Mentating ok  - Dig discontinued - Monitor rate on tele - C/w Eliquis   3. AKI - SCr 1.9>>5.5 - likely pre-renal from volume depletion  - improving w/ IVF hydration. SCr 4.5 today  - continue IVFs, monitor volume status closely w/ LV dysfunction  - Hold diuretics/ BP active meds. Avoid nephrotoxic agents   5. Hyperkalemia - 6.0 on admit, in setting of AKI  - corrected w/ insulin, lokelma and calcium gluconate, 4.3 today    6. CAD:   - H/o DES to LCX in 3/19, had occluded PLV at that time.  Recently admitted 5/22 with anterior STEMI and occluded mLAD, had DES to mLAD, known CTO PLV with collaterals and patent LCx stent.  No s/s ischemia - Continue ASA 81, Plavix. Will drop aspirin after 07/27/20, as he is also on apixaban. - Continue atorvastatin 80 mg daily.  7. 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  Recent admission 5/22 for CGS in setting of acute STEMI requiring PCI + IABP support. Echo 5/22 showed EF 20-25%, moderately decreased RV function with mild RVE, moderate biatrial enlargement, PASP 34, mild Douglas, mild TR, IVC normal. Stabilized and weaned off IABP to GDMT. Refused LifeVest at d/c. Now admitted for volume depletion and AKI - Hold diuretics. Continue hydration w/ IVFs w/ close monitoring of volume status  - Hold torsemide, Farxiga, Spiro, Hydarl Imdur w/ AKI and hypotension - Off digoxin w/ elevated level   8. Type II DM - management per IM     Medication concerns reviewed with patient and pharmacy team. Barriers identified: Cost assistance. Will ask PharmD to assist w/ meds. Will also engage HF SW team given social concerns/ housing placement.   Length of Stay: 1  Lyda Jester, PA-C  07/18/2020, 11:37 AM  Advanced Heart Failure Team Pager [phone] (M-F; 7a - 5p)  Please contact [facility] for night-coverage after hours (4p -7a ) and weekends on [email or web address]   Patient seen and examined with the above-signed Advanced Practice Provider and/or Housestaff. I personally reviewed laboratory data, imaging studies and relevant notes. I independently examined the patient and formulated the important aspects of the plan. I have edited the note to reflect any of my changes or salient points. I have personally discussed the plan with the patient and/or family.  70 y.o with advanced systolic HF due to iCM EF 20-25%, CAD s/p recent anterior stremi with PCI LAD 5/22, PAF s/p  recent DC-CV, CKD 4.  Recently admitted with anterior STEMI and developed shock requiring IABP and inotropes.Also underwent DC-CV for  AFL.   Now readmitted with volume depletion AKI and dig toxicity.Found to be back in AF with slow VR  Being hydrated. Renal function improving. Symptomatically improved but still orthostatic.   General:  Lying in bed No resp difficulty HEENT: normal Neck: supple. no JVD. Carotids 2+ bilat; no bruits. No lymphadenopathy or thryomegaly appreciated. Cor: PMI nondisplaced. Irregular brady No rubs, gallops or murmurs. Lungs: clear Abdomen: soft, nontender, nondistended. No hepatosplenomegaly. No bruits or masses. Good bowel sounds. Extremities: no cyanosis, clubbing, rash, edema Neuro: alert & orientedx3, cranial nerves grossly intact. moves all 4 extremities w/o difficulty. Affect pleasant  We gave digibind earlier this am for dig toxicity. Agree with continuing to hold HF meds. Continue gentle hydration. Does not appear to need inotropes at this time. Continue Eliquis. Will decide on need for repeat DCCV prior to discharge. Will likely need amio to maintain NSR but have to wait until dig toxicity resolved.   Glori Bickers, MD  10:36 PM

## 2020-07-19 LAB — BASIC METABOLIC PANEL
Anion gap: 8 (ref 5–15)
BUN: 98 mg/dL — ABNORMAL HIGH (ref 8–23)
CO2: 22 mmol/L (ref 22–32)
Calcium: 7.9 mg/dL — ABNORMAL LOW (ref 8.9–10.3)
Chloride: 106 mmol/L (ref 98–111)
Creatinine, Ser: 3.14 mg/dL — ABNORMAL HIGH (ref 0.61–1.24)
GFR, Estimated: 20 mL/min — ABNORMAL LOW (ref >60)
Glucose, Bld: 130 mg/dL — ABNORMAL HIGH (ref 70–99)
Potassium: 4.3 mmol/L (ref 3.5–5.1)
Sodium: 136 mmol/L (ref 135–145)

## 2020-07-19 LAB — GLUCOSE, CAPILLARY
Glucose-Capillary: 126 mg/dL — ABNORMAL HIGH (ref 70–99)
Glucose-Capillary: 142 mg/dL — ABNORMAL HIGH (ref 70–99)
Glucose-Capillary: 154 mg/dL — ABNORMAL HIGH (ref 70–99)
Glucose-Capillary: 182 mg/dL — ABNORMAL HIGH (ref 70–99)
Glucose-Capillary: 91 mg/dL (ref 70–99)

## 2020-07-19 LAB — MAGNESIUM: Magnesium: 2.1 mg/dL (ref 1.7–2.4)

## 2020-07-19 LAB — DIGOXIN LEVEL: Digoxin Level: 2.6 ng/mL (ref 0.8–2.0)

## 2020-07-19 MED ORDER — SODIUM CHLORIDE 0.9 % IV SOLN: INTRAVENOUS | Status: AC

## 2020-07-19 MED ORDER — POLYETHYLENE GLYCOL 3350 17 G PO PACK
17.0000 g | PACK | Freq: Two times a day (BID) | ORAL | Status: DC
  Administered 2020-07-19 – 2020-07-20 (×3): 17 g via ORAL
  Filled 2020-07-19 (×7): qty 1

## 2020-07-19 MED ORDER — MAGNESIUM HYDROXIDE 400 MG/5ML PO SUSP
30.0000 mL | Freq: Two times a day (BID) | ORAL | Status: AC
  Administered 2020-07-19: 30 mL via ORAL
  Filled 2020-07-19: qty 30

## 2020-07-19 MED ORDER — DOCUSATE SODIUM 100 MG PO CAPS
100.0000 mg | ORAL_CAPSULE | Freq: Two times a day (BID) | ORAL | Status: DC
  Administered 2020-07-19 – 2020-07-23 (×6): 100 mg via ORAL
  Filled 2020-07-19 (×8): qty 1

## 2020-07-19 MED ORDER — BISACODYL 10 MG RE SUPP
10.0000 mg | Freq: Once | RECTAL | Status: DC
  Filled 2020-07-19 (×2): qty 1

## 2020-07-19 NOTE — Progress Notes (Signed)
Advanced Heart Failure Rounding Note   Subjective:    Says he is still a little dizzy. Denies CP or SOB.  Wants more milk to drink    Objective:   Weight Range:  Vital Signs:   Temp:  [97.6 F (36.4 C)-98.2 F (36.8 C)] 97.6 F (36.4 C) (06/18 1151) Pulse Rate:  [38-60] 38 (06/18 1151) Resp:  [15-19] 18 (06/18 1151) BP: (106-113)/(47-68) 111/68 (06/18 1151) SpO2:  [96 %-100 %] 100 % (06/18 1151) Last BM Date: 07/16/20  Weight change: Filed Weights   07/17/20 1027 07/18/20 0300  Weight: 76 kg 71.9 kg    Intake/Output:   Intake/Output Summary (Last 24 hours) at 07/19/2020 1242 Last data filed at 07/19/2020 1020 Gross per 24 hour  Intake 3105.68 ml  Output 2975 ml  Net 130.68 ml     Physical Exam: General:  Sitting in chair Well appearing. No resp difficulty HEENT: normal Neck: supple. JVP . Carotids 2+ bilat; no bruits. No lymphadenopathy or thryomegaly appreciated. Cor: PMI nondisplaced. Brady irregular  No rubs, gallops or murmurs. Lungs: clear Abdomen: soft, nontender, nondistended. No hepatosplenomegaly. No bruits or masses. Good bowel sounds. Extremities: no cyanosis, clubbing, rash, edema Neuro: alert & orientedx3, cranial nerves grossly intact. moves all 4 extremities w/o difficulty. Affect pleasant  Telemetry: AF 50s Personally reviewed   Labs: Basic Metabolic Panel: Recent Labs  Lab 07/17/20 1238 07/17/20 2117 07/18/20 0034 07/19/20 0208  NA 132* 133* 132* 136  K 6.0* 5.3* 4.3 4.3  CL 95* 95* 99 106  CO2 23 25 22 22   GLUCOSE 166* 220* 149* 130*  BUN 146* 143* 137* 98*  CREATININE 5.47* 5.00* 4.54* 3.14*  CALCIUM 9.9 9.1 8.8* 7.9*  MG  --   --   --  2.1    Liver Function Tests: No results for input(s): AST, ALT, ALKPHOS, BILITOT, PROT, ALBUMIN in the last 168 hours. No results for input(s): LIPASE, AMYLASE in the last 168 hours. No results for input(s): AMMONIA in the last 168 hours.  CBC: Recent Labs  Lab 07/17/20 1238  WBC  9.9  NEUTROABS 8.8*  HGB 15.7  HCT 47.8  MCV 85.7  PLT 303    Cardiac Enzymes: No results for input(s): CKTOTAL, CKMB, CKMBINDEX, TROPONINI in the last 168 hours.  BNP: BNP (last 3 results) Recent Labs    07/17/20 1238  BNP 391.9*    ProBNP (last 3 results) No results for input(s): PROBNP in the last 8760 hours.    Other results:  Imaging: US RENAL  Result Date: 07/17/2020 CLINICAL DATA:  Acute kidney injury EXAM: RENAL / URINARY TRACT ULTRASOUND COMPLETE COMPARISON:  None. FINDINGS: Right Kidney: Renal measurements: 7.9 x 4.3 x 5.1 cm = volume: 88.6 mL. Echogenicity within normal limits. No hydronephrosis. Cyst in the upper pole measuring 2.8 x 2.5 x 2.2 cm. Cyst in the lower pole measuring 1.2 x 1.5 x 1 cm. Left Kidney: Renal measurements: 6.9 x 5.9 x 4.2 cm = volume: 88 mL. Suspect length measurement under estimated due to presence of lower pole cyst. Echogenicity within normal limits. No hydronephrosis. Cyst at the lower pole measuring 4.3 x 4.3 x 4.8 cm. Bladder: Appears normal for degree of bladder distention. Other: None. IMPRESSION: 1. Negative for hydronephrosis. 2. Simple appearing bilateral renal cysts Electronically Signed   By: Donavan Foil M.D.   On: 07/17/2020 15:52     Medications:     Scheduled Medications:  apixaban  5 mg Oral BID   aspirin  EC  81 mg Oral Daily   atorvastatin  80 mg Oral Daily   bisacodyl  10 mg Rectal Once   clopidogrel  75 mg Oral Daily   docusate sodium  100 mg Oral BID   insulin aspart  0-9 Units Subcutaneous TID WC   magnesium hydroxide  30 mL Oral BID   pantoprazole  40 mg Oral Q1200   polyethylene glycol  17 g Oral BID   sodium chloride flush  3 mL Intravenous Q12H    Infusions:  sodium chloride 75 mL/hr at 07/19/20 0731    PRN Medications: acetaminophen, albuterol, hydrALAZINE, ondansetron (ZOFRAN) IV, polyvinyl alcohol   Assessment:/Plan    Elevate Dig Level/ Dig Toxicity - Dig level 2.5>>3.2 -> treated with  digibind -> 2.6 - Remains bradycardic but no longer appears toxic    2. AFlutter: Atypical. Diagnosed previous admit 5/22. S/p TEE/DC-CV on 5/27. Back in AFL, V-rates currently 50s - Dig discontinued - Monitor rate on tele - C/w Eliquis - Can consider repeat DC-CV at some point but will need amio and currently not candidate with bradycardia.    3. AKI on CKD 4 - baseline Scr ~ 1.9-2.1 - SCr 1.9>>5.5 -> 3.1 - likely pre-renal from volume depletion - improving w/ IVF hydration. Renal managing. Suspect we can stop IVF soon  - Hold diuretics/ BP active meds. Avoid nephrotoxic agents   5. Hyperkalemia - 6.0 on admit, in setting of AKI - corrected w/ insulin, lokelma and calcium gluconate, 4.3 today      6. CAD:   - H/o DES to LCX in 3/19, had occluded PLV at that time. Recently admitted 5/22 with anterior STEMI and occluded mLAD, had DES to mLAD, known CTO PLV with collaterals and patent LCx stent.  No s/s ischemia - Continue ASA 81, Plavix. Will drop aspirin after 07/27/20, as he is also on apixaban. - Continue atorvastatin 80 mg daily.   7. 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  Recent admission 5/22 for CGS in setting of acute STEMI requiring PCI + IABP support. Echo 5/22 showed EF 20-25%, moderately decreased RV function with mild RVE, moderate biatrial enlargement, PASP 34, mild MR, mild TR, IVC normal. Stabilized and weaned off IABP to GDMT. Refused LifeVest at d/c. Now admitted for volume depletion and AKI - Holding HF meds and diuretics. Consider restarting soon  - Hold torsemide, Gareth Eagle, Hydarl Imdur w/ AKI and hypotension - Off digoxin w/ elevated level   I will see again on Monday. Call with with questions.   Length of Stay: 2   Glori Bickers 07/19/2020, 12:42 PM  Advanced Heart Failure Team Pager [phone] (M-F; 7a - 4p)  Please contact [facility] for  night-coverage after hours (4p -7a ) and weekends on [email or web address]

## 2020-07-19 NOTE — Progress Notes (Signed)
PROGRESS NOTE        PATIENT DETAILS Name: Douglas Villa Age: 70 y.o. Sex: male Date of Birth: 03/18/1950 Admit Date: 07/17/2020 Admitting Physician Lequita Halt, MD [id number], [name], MD  Brief Narrative: Patient is a 70 y.o. male with history of recent STEMI-s/p PCI of distal LAD on 5/20, HFrEF, atrial flutter, CKD stage IV-who was seen at CHF clinic yesterday-and was found to be profoundly weak, orthostatic-he was subsequently referred to the ED where he was found to have acute kidney injury.  Significant events: 6/16>> admit for AKI 6/17>> digoxin level 3.2  Significant studies: 6/16>> CXR: No pneumonia  Antimicrobial therapy: None  Microbiology data: 6/16>> COVID/influenza PCR: Negative  Procedures : None  Consults: Cardiology, nephrology  DVT Prophylaxis : apixaban (ELIQUIS) tablet 5 mg    Subjective:  Patient in bed, appears comfortable, denies any headache, no fever, no chest pain or pressure, no shortness of breath , no abdominal pain. No new focal weakness.    Assessment/Plan:  AKI on CKD stage IV: AKI hemodynamically mediated-improving with IV fluids, renal ultrasound nonacute, baseline creatinine around 2, renal function improving, nephrology on board  Hyperkalemia: Resolved.  Digoxin toxicity: Nausea is better, still having episodes of mild bradycardia but with stable blood pressure, digoxin level coming down, evaluated by CHF team who are following, received DigiFab on 07/18/2020.  HFrEF: Volume status stable-in fact was on the dry side yesterday and given IV fluids.  Watch closely.  CAD-s/p recent STEMI requiring PCI to distal LAD: Remains on aspirin/Plavix/statin-not on beta-blocker per cardiology-due to low flow state.  Atrial flutter: Remains in atrial flutter this morning-rate controlled-digoxin levels supratherapeutic-amiodarone held on admission-remains anticoagulated with Eliquis.  Await further input  from cardiology  HTN: BP currently stable-not on any antihypertensives currently-as was orthostatic and clinically dry.  DM-2 (A1C 7.7 on 5/21): CBG stable-continue SSI-oral hypoglycemics remain on hold  Diet: Diet Order             Diet renal/carb modified with fluid restriction Diet-HS Snack? Nothing; Fluid restriction: 1200 mL Fluid; Room service appropriate? Yes; Fluid consistency: Thin  Diet effective now                    Code Status: Full code  Family Communication: None at bedside   Disposition Plan: Status is: Inpatient  Remains inpatient appropriate because:Inpatient level of care appropriate due to severity of illness  Dispo: The patient is from: Home              Anticipated d/c is to: Home              Patient currently is not medically stable to d/c.   Difficult to place patient No     Barriers to Discharge: Improving renal function-noted at baseline-digoxin toxicity-requiring DigiFab.  Antimicrobial agents: Anti-infectives (From admission, onward)    None        Time spent: 45 minutes-Greater than 50% of this time was spent in counseling, explanation of diagnosis, planning of further management, and coordination of care.  MEDICATIONS: Scheduled Meds:  apixaban  5 mg Oral BID   aspirin EC  81 mg Oral Daily   atorvastatin  80 mg Oral Daily   bisacodyl  10 mg Rectal Once   clopidogrel  75 mg Oral Daily   docusate sodium  100 mg Oral BID  insulin aspart  0-9 Units Subcutaneous TID WC   magnesium hydroxide  30 mL Oral BID   pantoprazole  40 mg Oral Q1200   polyethylene glycol  17 g Oral BID   sodium chloride flush  3 mL Intravenous Q12H   Continuous Infusions:  sodium chloride 75 mL/hr at 07/19/20 0731   PRN Meds:.acetaminophen, albuterol, hydrALAZINE, ondansetron (ZOFRAN) IV, polyvinyl alcohol   PHYSICAL EXAM: Vital signs: Vitals:   07/19/20 0004 07/19/20 0408 07/19/20 0721 07/19/20 1151  BP: (!) 108/58 113/64 (!) 106/47 111/68   Pulse: 60 (!) 55 (!) 52 (!) 38  Resp: 18 18 15 18   Temp: 98.2 F (36.8 C) 98.1 F (36.7 C) 97.6 F (36.4 C) 97.6 F (36.4 C)  TempSrc: Oral Oral Oral Oral  SpO2: 98% 98% 99% 100%  Weight:      Height:       Filed Weights   07/17/20 1027 07/18/20 0300  Weight: 76 kg 71.9 kg   Body mass index is 24.83 kg/m.   Gen Exam:  Awake Alert, No new F.N deficits, Normal affect [place].AT,PERRAL Supple Neck,No JVD, No cervical lymphadenopathy appriciated.  Symmetrical Chest wall movement, Good air movement bilaterally, CTAB RRR,No Gallops, Rubs or new Murmurs, No Parasternal Heave +ve B.Sounds, Abd Soft, No tenderness, No organomegaly appriciated, No rebound - guarding or rigidity. No Cyanosis, Clubbing or edema, No new Rash or bruise  I have personally reviewed following labs and imaging studies  LABORATORY DATA: CBC: Recent Labs  Lab 07/17/20 1238  WBC 9.9  NEUTROABS 8.8*  HGB 15.7  HCT 47.8  MCV 85.7  PLT 160    Basic Metabolic Panel: Recent Labs  Lab 07/17/20 1238 07/17/20 2117 07/18/20 0034 07/19/20 0208  NA 132* 133* 132* 136  K 6.0* 5.3* 4.3 4.3  CL 95* 95* 99 106  CO2 23 25 22 22   GLUCOSE 166* 220* 149* 130*  BUN 146* 143* 137* 98*  CREATININE 5.47* 5.00* 4.54* 3.14*  CALCIUM 9.9 9.1 8.8* 7.9*  MG  --   --   --  2.1    GFR: Estimated Creatinine Clearance: 19.9 mL/min (A) (by C-G formula based on SCr of 3.14 mg/dL (H)).  Liver Function Tests: No results for input(s): AST, ALT, ALKPHOS, BILITOT, PROT, ALBUMIN in the last 168 hours. No results for input(s): LIPASE, AMYLASE in the last 168 hours. No results for input(s): AMMONIA in the last 168 hours.  Coagulation Profile: No results for input(s): INR, PROTIME in the last 168 hours.  Cardiac Enzymes: No results for input(s): CKTOTAL, CKMB, CKMBINDEX, TROPONINI in the last 168 hours.  BNP (last 3 results) No results for input(s): PROBNP in the last 8760 hours.  Lipid Profile: No results for input(s):  CHOL, HDL, LDLCALC, TRIG, CHOLHDL, LDLDIRECT in the last 72 hours.  Thyroid Function Tests: No results for input(s): TSH, T4TOTAL, FREET4, T3FREE, THYROIDAB in the last 72 hours.  Anemia Panel: No results for input(s): VITAMINB12, FOLATE, FERRITIN, TIBC, IRON, RETICCTPCT in the last 72 hours.  Urine analysis: No results found for: COLORURINE, APPEARANCEUR, LABSPEC, PHURINE, GLUCOSEU, HGBUR, BILIRUBINUR, KETONESUR, PROTEINUR, UROBILINOGEN, NITRITE, LEUKOCYTESUR  Sepsis Labs: Lactic Acid, Venous    Component Value Date/Time   LATICACIDVEN 1.3 06/20/2020 1433    MICROBIOLOGY: Recent Results (from the past 240 hour(s))  Resp Panel by RT-PCR (Flu A&B, Covid) Nasopharyngeal Swab     Status: None   Collection Time: 07/17/20  6:29 PM   Specimen: Nasopharyngeal Swab; Nasopharyngeal(NP) swabs in vial transport medium  Result  Value Ref Range Status   SARS Coronavirus 2 by RT PCR NEGATIVE NEGATIVE Final    Comment: (NOTE) SARS-CoV-2 target nucleic acids are NOT DETECTED.  The SARS-CoV-2 RNA is generally detectable in upper respiratory specimens during the acute phase of infection. The lowest concentration of SARS-CoV-2 viral copies this assay can detect is 138 copies/mL. A negative result does not preclude SARS-Cov-2 infection and should not be used as the sole basis for treatment or other patient management decisions. A negative result may occur with  improper specimen collection/handling, submission of specimen other than nasopharyngeal swab, presence of viral mutation(s) within the areas targeted by this assay, and inadequate number of viral copies(<138 copies/mL). A negative result must be combined with clinical observations, patient history, and epidemiological information. The expected result is Negative.  Fact Sheet for Patients:  EntrepreneurPulse.com.au  Fact Sheet for Healthcare Providers:  IncredibleEmployment.be  This test is no t yet  approved or cleared by the [place] FDA and  has been authorized for detection and/or diagnosis of SARS-CoV-2 by FDA under an Emergency Use Authorization (EUA). This EUA will remain  in effect (meaning this test can be used) for the duration of the COVID-19 declaration under Section 564(b)(1) of the Act, 21 U.S.C.section 360bbb-3(b)(1), unless the authorization is terminated  or revoked sooner.       Influenza A by PCR NEGATIVE NEGATIVE Final   Influenza B by PCR NEGATIVE NEGATIVE Final    Comment: (NOTE) The Xpert Xpress SARS-CoV-2/FLU/RSV plus assay is intended as an aid in the diagnosis of influenza from Nasopharyngeal swab specimens and should not be used as a sole basis for treatment. Nasal washings and aspirates are unacceptable for Xpert Xpress SARS-CoV-2/FLU/RSV testing.  Fact Sheet for Patients: EntrepreneurPulse.com.au  Fact Sheet for Healthcare Providers: IncredibleEmployment.be  This test is not yet approved or cleared by the [place] FDA and has been authorized for detection and/or diagnosis of SARS-CoV-2 by FDA under an Emergency Use Authorization (EUA). This EUA will remain in effect (meaning this test can be used) for the duration of the COVID-19 declaration under Section 564(b)(1) of the Act, 21 U.S.C. section 360bbb-3(b)(1), unless the authorization is terminated or revoked.  Performed at [facility], [place] [address] [place], [place] [address]     RADIOLOGY STUDIES/RESULTS: US RENAL  Result Date: 07/17/2020 CLINICAL DATA:  Acute kidney injury EXAM: RENAL / URINARY TRACT ULTRASOUND COMPLETE COMPARISON:  None. FINDINGS: Right Kidney: Renal measurements: 7.9 x 4.3 x 5.1 cm = volume: 88.6 mL. Echogenicity within normal limits. No hydronephrosis. Cyst in the upper pole measuring 2.8 x 2.5 x 2.2 cm. Cyst in the lower pole measuring 1.2 x 1.5 x 1 cm. Left Kidney: Renal measurements: 6.9 x 5.9 x 4.2 cm = volume: 88  mL. Suspect length measurement under estimated due to presence of lower pole cyst. Echogenicity within normal limits. No hydronephrosis. Cyst at the lower pole measuring 4.3 x 4.3 x 4.8 cm. Bladder: Appears normal for degree of bladder distention. Other: None. IMPRESSION: 1. Negative for hydronephrosis. 2. Simple appearing bilateral renal cysts Electronically Signed   By: Donavan Foil M.D.   On: 07/17/2020 15:52     LOS: 2 days   Signature  Lala Lund M.D on 07/19/2020 at 12:58 PM   -  To page go to [email or web address]

## 2020-07-19 NOTE — Progress Notes (Signed)
[facility] Progress Note    Assessment/ Plan:    1.AKI on CKD IV- pt has some CKD dating back to 2019- possibly due to DM or cardiorenal.  Has small kidney sizes on imaging.  This most recent change from 5/29 to today seems to be due to volume depletion and hypotension that was iatrogenic from meds.  - continue hydration- 24 more hrs ordered  - hold BP meds and Farxiga - no hydro on renal US, some simple cysts - once IVFs stopped can start back some meds if BP will allow, would add one at a time  2. Hypertension/volume  - volume depleted and hypotensive-  holding BP meds and gently hydrating  3. Hyperkalemia -  on aldactone and potassium as OP-  hold, now WNL  4.  Possible dig toxicity: got digibind 6/17.  5.  Dispo: nothing else to offer from renal perspective.  Will sign off.    Subjective:    Dig level was pretty high yesterday so got Digibind.  On IVFs still- Cr improving.    Objective:   BP (!) 106/47 (BP Location: Right Arm)   Pulse (!) 52   Temp 97.6 F (36.4 C) (Oral)   Resp 15   Ht 5\' 7"  (1.702 m)   Wt 71.9 kg   SpO2 99%   BMI 24.83 kg/m   Intake/Output Summary (Last 24 hours) at 07/19/2020 1036 Last data filed at 07/19/2020 0810 Gross per 24 hour  Intake 3105.68 ml  Output 2375 ml  Net 730.68 ml   Weight change:   Physical Exam: Gen: NAD CVS: RRR Resp: clear Abd: soft Ext: no LE at all  Imaging: US RENAL  Result Date: 07/17/2020 CLINICAL DATA:  Acute kidney injury EXAM: RENAL / URINARY TRACT ULTRASOUND COMPLETE COMPARISON:  None. FINDINGS: Right Kidney: Renal measurements: 7.9 x 4.3 x 5.1 cm = volume: 88.6 mL. Echogenicity within normal limits. No hydronephrosis. Cyst in the upper pole measuring 2.8 x 2.5 x 2.2 cm. Cyst in the lower pole measuring 1.2 x 1.5 x 1 cm. Left Kidney: Renal measurements: 6.9 x 5.9 x 4.2 cm = volume: 88 mL. Suspect length measurement under estimated due to presence of lower pole cyst. Echogenicity within normal  limits. No hydronephrosis. Cyst at the lower pole measuring 4.3 x 4.3 x 4.8 cm. Bladder: Appears normal for degree of bladder distention. Other: None. IMPRESSION: 1. Negative for hydronephrosis. 2. Simple appearing bilateral renal cysts Electronically Signed   By: Donavan Foil M.D.   On: 07/17/2020 15:52   DG Chest Port 1 View  Result Date: 07/17/2020 CLINICAL DATA:  Weakness EXAM: PORTABLE CHEST 1 VIEW COMPARISON:  Jun 21, 2020 FINDINGS: The lungs are clear. Heart is mildly enlarged with pulmonary vascularity normal. No adenopathy. No pneumothorax. No bone lesions. There is aortic atherosclerosis. IMPRESSION: Mild cardiomegaly. Lungs clear. Aortic Atherosclerosis (ICD10-I70.0). Electronically Signed   By: Lowella Grip III M.D.   On: 07/17/2020 11:53    Labs: BMET Recent Labs  Lab 07/17/20 1238 07/17/20 2117 07/18/20 0034 07/19/20 0208  NA 132* 133* 132* 136  K 6.0* 5.3* 4.3 4.3  CL 95* 95* 99 106  CO2 23 25 22 22   GLUCOSE 166* 220* 149* 130*  BUN 146* 143* 137* 98*  CREATININE 5.47* 5.00* 4.54* 3.14*  CALCIUM 9.9 9.1 8.8* 7.9*   CBC Recent Labs  Lab 07/17/20 1238  WBC 9.9  NEUTROABS 8.8*  HGB 15.7  HCT 47.8  MCV 85.7  PLT 303  Medications:     apixaban  5 mg Oral BID   aspirin EC  81 mg Oral Daily   atorvastatin  80 mg Oral Daily   bisacodyl  10 mg Rectal Once   clopidogrel  75 mg Oral Daily   docusate sodium  100 mg Oral BID   insulin aspart  0-9 Units Subcutaneous TID WC   magnesium hydroxide  30 mL Oral BID   pantoprazole  40 mg Oral Q1200   polyethylene glycol  17 g Oral BID   sodium chloride flush  3 mL Intravenous Q12H      Madelon Lips MD 07/19/2020, 10:36 AM

## 2020-07-19 NOTE — Progress Notes (Signed)
Patients heart rate has remained in low 40's to high 30's for the majority of this shift. He is asymptomatic and has no complaints of discomfort. This nurse notified by central tele of consistent heart rate. Notified Dr. Candiss Norse, he is aware and asked to notify cardiology. Paged Cardio team. Awaiting response.

## 2020-07-20 LAB — CBC WITH DIFFERENTIAL/PLATELET
Abs Immature Granulocytes: 0.02 10*3/uL (ref 0.00–0.07)
Basophils Absolute: 0 10*3/uL (ref 0.0–0.1)
Basophils Relative: 0 %
Eosinophils Absolute: 0.3 10*3/uL (ref 0.0–0.5)
Eosinophils Relative: 5 %
HCT: 31.8 % — ABNORMAL LOW (ref 39.0–52.0)
Hemoglobin: 10.1 g/dL — ABNORMAL LOW (ref 13.0–17.0)
Immature Granulocytes: 0 %
Lymphocytes Relative: 15 %
Lymphs Abs: 0.7 10*3/uL (ref 0.7–4.0)
MCH: 28.1 pg (ref 26.0–34.0)
MCHC: 31.8 g/dL (ref 30.0–36.0)
MCV: 88.3 fL (ref 80.0–100.0)
Monocytes Absolute: 0.5 10*3/uL (ref 0.1–1.0)
Monocytes Relative: 10 %
Neutro Abs: 3.4 10*3/uL (ref 1.7–7.7)
Neutrophils Relative %: 70 %
Platelets: 109 10*3/uL — ABNORMAL LOW (ref 150–400)
RBC: 3.6 MIL/uL — ABNORMAL LOW (ref 4.22–5.81)
RDW: 19.2 % — ABNORMAL HIGH (ref 11.5–15.5)
WBC: 4.8 10*3/uL (ref 4.0–10.5)
nRBC: 0 % (ref 0.0–0.2)

## 2020-07-20 LAB — GLUCOSE, CAPILLARY
Glucose-Capillary: 89 mg/dL (ref 70–99)
Glucose-Capillary: 110 mg/dL — ABNORMAL HIGH (ref 70–99)
Glucose-Capillary: 154 mg/dL — ABNORMAL HIGH (ref 70–99)
Glucose-Capillary: 201 mg/dL — ABNORMAL HIGH (ref 70–99)

## 2020-07-20 LAB — COMPREHENSIVE METABOLIC PANEL
ALT: 19 U/L (ref 0–44)
AST: 31 U/L (ref 15–41)
Albumin: 2.6 g/dL — ABNORMAL LOW (ref 3.5–5.0)
Alkaline Phosphatase: 63 U/L (ref 38–126)
Anion gap: 7 (ref 5–15)
BUN: 57 mg/dL — ABNORMAL HIGH (ref 8–23)
CO2: 25 mmol/L (ref 22–32)
Calcium: 8 mg/dL — ABNORMAL LOW (ref 8.9–10.3)
Chloride: 106 mmol/L (ref 98–111)
Creatinine, Ser: 2.2 mg/dL — ABNORMAL HIGH (ref 0.61–1.24)
GFR, Estimated: 31 mL/min — ABNORMAL LOW (ref >60)
Glucose, Bld: 117 mg/dL — ABNORMAL HIGH (ref 70–99)
Potassium: 4.2 mmol/L (ref 3.5–5.1)
Sodium: 138 mmol/L (ref 135–145)
Total Bilirubin: 0.7 mg/dL (ref 0.3–1.2)
Total Protein: 5.4 g/dL — ABNORMAL LOW (ref 6.5–8.1)

## 2020-07-20 LAB — TSH: TSH: 0.964 u[IU]/mL (ref 0.350–4.500)

## 2020-07-20 LAB — BRAIN NATRIURETIC PEPTIDE: B Natriuretic Peptide: 1426.1 pg/mL — ABNORMAL HIGH (ref 0.0–100.0)

## 2020-07-20 LAB — MAGNESIUM: Magnesium: 2 mg/dL (ref 1.7–2.4)

## 2020-07-20 LAB — DIGOXIN LEVEL: Digoxin Level: 1.7 ng/mL (ref 0.8–2.0)

## 2020-07-20 MED ORDER — TRAMADOL HCL 50 MG PO TABS
50.0000 mg | ORAL_TABLET | Freq: Two times a day (BID) | ORAL | Status: DC | PRN
  Administered 2020-07-20: 50 mg via ORAL
  Filled 2020-07-20: qty 1

## 2020-07-20 MED ORDER — FENTANYL CITRATE (PF) 100 MCG/2ML IJ SOLN
25.0000 ug | INTRAMUSCULAR | Status: DC | PRN
  Filled 2020-07-20: qty 2

## 2020-07-20 MED ORDER — METHYLPREDNISOLONE SODIUM SUCC 40 MG IJ SOLR
40.0000 mg | Freq: Once | INTRAMUSCULAR | Status: AC
  Administered 2020-07-20: 40 mg via INTRAVENOUS
  Filled 2020-07-20: qty 1

## 2020-07-20 NOTE — Progress Notes (Signed)
Administered solumedrol and prn tramadol as ordered for right great toe gout pain. Placed TED hose on patient bilaterally. Patient states that he wants to give the medication time to work before trying to walk on that foot. Will continue to monitor.

## 2020-07-20 NOTE — Progress Notes (Signed)
Patient complaining of gout like pain to right great toe, and does not want to get out of the bed to ambulate as ordered by MD for vitals. Notified MD of patient complaint. Orders rec'd for Solumedrol 40mg  IV once and Tramadol 50mg  every 8 hours as needed. Will administer meds as ordered and try to ambulate patient after meds.

## 2020-07-20 NOTE — Progress Notes (Signed)
PROGRESS NOTE        PATIENT DETAILS Name: Douglas Villa Age: 70 y.o. Sex: male Date of Birth: April 28, 1950 Admit Date: 07/17/2020 Admitting Physician Lequita Halt, MD WEX:[id number], [name], MD  Brief Narrative: Patient is a 70 y.o. male with history of recent STEMI-s/p PCI of distal LAD on 5/20, HFrEF, atrial flutter, CKD stage IV-who was seen at CHF clinic yesterday-and was found to be profoundly weak, orthostatic-he was subsequently referred to the ED where he was found to have acute kidney injury.  Significant events: 6/16>> admit for AKI 6/17>> digoxin level 3.2  Significant studies: 6/16>> CXR: No pneumonia  Antimicrobial therapy: None  Microbiology data: 6/16>> COVID/influenza PCR: Negative  Procedures : None  Consults: Cardiology, nephrology  DVT Prophylaxis : apixaban (ELIQUIS) tablet 5 mg    Subjective:  Patient in bed, appears comfortable, denies any headache, no fever, no chest pain or pressure, no shortness of breath , no abdominal pain. No new focal weakness.   Assessment/Plan:  AKI on CKD stage IV: AKI hemodynamically mediated-improving with IV fluids, renal ultrasound nonacute, baseline creatinine around 2, renal function improving, nephrology on board.  Hyperkalemia: Resolved.  Digoxin toxicity: Nausea is better, received DigiFab on 07/18/2020, digoxin level coming down and now near normal levels on 10/20/2020, evaluated by CHF team who are following, heart rate blood pressure gradually improving, gradually improving, will advance activity and look for appropriate chronotropic response.  HFrEF: Volume status stable-in fact was on the dry side yesterday and given IV fluids.  Watch closely.  CAD-s/p recent STEMI requiring PCI to distal LAD: Remains on aspirin/Plavix/statin-not on beta-blocker per cardiology-due to low flow state.  Atrial flutter: Remains in atrial flutter this morning-rate controlled-digoxin levels  supratherapeutic-amiodarone held on admission-remains anticoagulated with Eliquis.  Await further input from cardiology  HTN: BP proving after hydration with IV fluids continue to monitor.  Apply TED stockings for now.  DM-2 (A1C 7.7 on 5/21): CBG stable-continue SSI-oral hypoglycemics remain on hold  Diet: Diet Order             Diet renal/carb modified with fluid restriction Diet-HS Snack? Nothing; Fluid restriction: 1200 mL Fluid; Room service appropriate? Yes; Fluid consistency: Thin  Diet effective now                    Code Status: Full code  Family Communication: None at bedside   Disposition Plan: Status is: Inpatient  Remains inpatient appropriate because:Inpatient level of care appropriate due to severity of illness  Dispo: The patient is from: Home              Anticipated d/c is to: Home              Patient currently is not medically stable to d/c.   Difficult to place patient No     Barriers to Discharge: Improving renal function-noted at baseline-digoxin toxicity-requiring DigiFab.  Antimicrobial agents: Anti-infectives (From admission, onward)    None        Time spent: 45 minutes-Greater than 50% of this time was spent in counseling, explanation of diagnosis, planning of further management, and coordination of care.  MEDICATIONS: Scheduled Meds:  apixaban  5 mg Oral BID   aspirin EC  81 mg Oral Daily   atorvastatin  80 mg Oral Daily   bisacodyl  10 mg Rectal Once  clopidogrel  75 mg Oral Daily   docusate sodium  100 mg Oral BID   insulin aspart  0-9 Units Subcutaneous TID WC   magnesium hydroxide  30 mL Oral BID   pantoprazole  40 mg Oral Q1200   polyethylene glycol  17 g Oral BID   sodium chloride flush  3 mL Intravenous Q12H   Continuous Infusions:   PRN Meds:.acetaminophen, albuterol, hydrALAZINE, ondansetron (ZOFRAN) IV, polyvinyl alcohol   PHYSICAL EXAM: Vital signs: Vitals:   07/19/20 2000 07/20/20 0500 07/20/20 0616  07/20/20 0758  BP:   (!) 98/58 119/66  Pulse:   (!) 53 (!) 55  Resp:   16 19  Temp: 97.6 F (36.4 C)  97.8 F (36.6 C) 97.6 F (36.4 C)  TempSrc: Axillary  Axillary Oral  SpO2:   100% 100%  Weight:  77.1 kg    Height:       Filed Weights   07/17/20 1027 07/18/20 0300 07/20/20 0500  Weight: 76 kg 71.9 kg 77.1 kg   Body mass index is 26.62 kg/m.   Gen Exam:  Awake Alert, No new F.N deficits, Normal affect [place].AT,PERRAL Supple Neck,No JVD, No cervical lymphadenopathy appriciated.  Symmetrical Chest wall movement, Good air movement bilaterally, CTAB RRR,No Gallops, Rubs or new Murmurs, No Parasternal Heave +ve B.Sounds, Abd Soft, No tenderness, No organomegaly appriciated, No rebound - guarding or rigidity. No Cyanosis, Clubbing or edema, No new Rash or bruise   I have personally reviewed following labs and imaging studies  LABORATORY DATA: CBC: Recent Labs  Lab 07/17/20 1238 07/20/20 0109  WBC 9.9 4.8  NEUTROABS 8.8* 3.4  HGB 15.7 10.1*  HCT 47.8 31.8*  MCV 85.7 88.3  PLT 303 109*    Basic Metabolic Panel: Recent Labs  Lab 07/17/20 1238 07/17/20 2117 07/18/20 0034 07/19/20 0208 07/20/20 0109  NA 132* 133* 132* 136 138  K 6.0* 5.3* 4.3 4.3 4.2  CL 95* 95* 99 106 106  CO2 23 25 22 22 25   GLUCOSE 166* 220* 149* 130* 117*  BUN 146* 143* 137* 98* 57*  CREATININE 5.47* 5.00* 4.54* 3.14* 2.20*  CALCIUM 9.9 9.1 8.8* 7.9* 8.0*  MG  --   --   --  2.1 2.0    GFR: Estimated Creatinine Clearance: 28.4 mL/min (A) (by C-G formula based on SCr of 2.2 mg/dL (H)).  Liver Function Tests: Recent Labs  Lab 07/20/20 0109  AST 31  ALT 19  ALKPHOS 63  BILITOT 0.7  PROT 5.4*  ALBUMIN 2.6*   No results for input(s): LIPASE, AMYLASE in the last 168 hours. No results for input(s): AMMONIA in the last 168 hours.  Coagulation Profile: No results for input(s): INR, PROTIME in the last 168 hours.  Cardiac Enzymes: No results for input(s): CKTOTAL, CKMB, CKMBINDEX,  TROPONINI in the last 168 hours.  BNP (last 3 results) No results for input(s): PROBNP in the last 8760 hours.  Lipid Profile: No results for input(s): CHOL, HDL, LDLCALC, TRIG, CHOLHDL, LDLDIRECT in the last 72 hours.  Thyroid Function Tests: Recent Labs    07/20/20 0804  TSH 0.964    Anemia Panel: No results for input(s): VITAMINB12, FOLATE, FERRITIN, TIBC, IRON, RETICCTPCT in the last 72 hours.  Urine analysis: No results found for: COLORURINE, APPEARANCEUR, LABSPEC, PHURINE, GLUCOSEU, HGBUR, BILIRUBINUR, KETONESUR, PROTEINUR, UROBILINOGEN, NITRITE, LEUKOCYTESUR  Sepsis Labs: Lactic Acid, Venous    Component Value Date/Time   LATICACIDVEN 1.3 06/20/2020 1433    MICROBIOLOGY: Recent Results (from the past 240  hour(s))  Resp Panel by RT-PCR (Flu A&B, Covid) Nasopharyngeal Swab     Status: None   Collection Time: 07/17/20  6:29 PM   Specimen: Nasopharyngeal Swab; Nasopharyngeal(NP) swabs in vial transport medium  Result Value Ref Range Status   SARS Coronavirus 2 by RT PCR NEGATIVE NEGATIVE Final    Comment: (NOTE) SARS-CoV-2 target nucleic acids are NOT DETECTED.  The SARS-CoV-2 RNA is generally detectable in upper respiratory specimens during the acute phase of infection. The lowest concentration of SARS-CoV-2 viral copies this assay can detect is 138 copies/mL. A negative result does not preclude SARS-Cov-2 infection and should not be used as the sole basis for treatment or other patient management decisions. A negative result may occur with  improper specimen collection/handling, submission of specimen other than nasopharyngeal swab, presence of viral mutation(s) within the areas targeted by this assay, and inadequate number of viral copies(<138 copies/mL). A negative result must be combined with clinical observations, patient history, and epidemiological information. The expected result is Negative.  Fact Sheet for Patients:   EntrepreneurPulse.com.au  Fact Sheet for Healthcare Providers:  IncredibleEmployment.be  This test is no t yet approved or cleared by the [place] FDA and  has been authorized for detection and/or diagnosis of SARS-CoV-2 by FDA under an Emergency Use Authorization (EUA). This EUA will remain  in effect (meaning this test can be used) for the duration of the COVID-19 declaration under Section 564(b)(1) of the Act, 21 U.S.C.section 360bbb-3(b)(1), unless the authorization is terminated  or revoked sooner.       Influenza A by PCR NEGATIVE NEGATIVE Final   Influenza B by PCR NEGATIVE NEGATIVE Final    Comment: (NOTE) The Xpert Xpress SARS-CoV-2/FLU/RSV plus assay is intended as an aid in the diagnosis of influenza from Nasopharyngeal swab specimens and should not be used as a sole basis for treatment. Nasal washings and aspirates are unacceptable for Xpert Xpress SARS-CoV-2/FLU/RSV testing.  Fact Sheet for Patients: EntrepreneurPulse.com.au  Fact Sheet for Healthcare Providers: IncredibleEmployment.be  This test is not yet approved or cleared by the [place] FDA and has been authorized for detection and/or diagnosis of SARS-CoV-2 by FDA under an Emergency Use Authorization (EUA). This EUA will remain in effect (meaning this test can be used) for the duration of the COVID-19 declaration under Section 564(b)(1) of the Act, 21 U.S.C. section 360bbb-3(b)(1), unless the authorization is terminated or revoked.  Performed at [facility], [place] [address] [place], [place] [address]     RADIOLOGY STUDIES/RESULTS: No results found.   LOS: 3 days   Signature  Lala Lund M.D on 07/20/2020 at 9:29 AM   -  To page go to [email or web address]

## 2020-07-20 NOTE — Plan of Care (Signed)

## 2020-07-20 NOTE — Progress Notes (Signed)
PT Cancellation Note  Patient Details Name: Douglas Villa MRN: [id number] DOB: Nov 28, 1950   Cancelled Treatment:    Reason Eval/Treat Not Completed: Pain limiting ability to participate Pt politely declining out of bed therapy due to right first toe "gout" pain. Notified RN.  Wyona Almas, PT, DPT Acute Rehabilitation Services Pager [phone] Office [phone]    Deno Etienne 07/20/2020, 2:47 PM

## 2020-07-20 NOTE — TOC Initial Note (Signed)
Transition of Care [facility]) - Initial/Assessment Note    Patient Details  Name: Douglas Villa MRN: [id number] Date of Birth: 05-Jun-1950  Transition of Care [facility]) CM/SW Contact:    Carles Collet, RN Phone Number: 07/20/2020, 3:12 PM  Clinical Narrative:       PAtient admitted from home (motel) with visible bed bugs on clothes. Ex wife assists.         Recs for SNF placement.  Bed bugs will be significant barrier to finding SNF/ [facility] agency that will accept.       Recommend maximizing PT services in house prior to DC and mobilizing as much as possible by unit staff.    Expected Discharge Plan: Home/Self Care Barriers to Discharge: Continued Medical Work up (BED BUGS)   Patient Goals and CMS Choice        Expected Discharge Plan and Services Expected Discharge Plan: Home/Self Care   Discharge Planning Services: CM Consult   Living arrangements for the past 2 months: Hotel/Motel                                      Prior Living Arrangements/Services Living arrangements for the past 2 months: Hotel/Motel Lives with:: Self                   Activities of Daily Living Home Assistive Devices/Equipment: None ADL Screening (condition at time of admission) Patient's cognitive ability adequate to safely complete daily activities?: Yes Is the patient deaf or have difficulty hearing?: No Does the patient have difficulty seeing, even when wearing glasses/contacts?: Yes Does the patient have difficulty concentrating, remembering, or making decisions?: Yes Patient able to express need for assistance with ADLs?: Yes Does the patient have difficulty dressing or bathing?: No Independently performs ADLs?: Yes (appropriate for developmental age) Does the patient have difficulty walking or climbing stairs?: No Weakness of Legs: None Weakness of Arms/Hands: None  Permission Sought/Granted                  Emotional Assessment              Admission  diagnosis:  Hyperkalemia [E87.5] AKI (acute kidney injury) ([place]) [N17.9] Type 2 diabetes mellitus with diabetic nephropathy, without long-term current use of insulin ([place]) [E11.21] Patient Active Problem List   Diagnosis Date Noted   AKI (acute kidney injury) ([place]) 07/17/2020   Hyperkalemia    Atrial fibrillation ([place]) 06/29/2020   Tricuspid regurgitation 06/29/2020   Anemia 06/29/2020   Hyponatremia 06/29/2020   Thrombocytopenia ([place]) 06/29/2020   STEMI involving left anterior descending coronary artery ([place]) 06/20/2020   Acute ST elevation myocardial infarction (STEMI) due to occlusion of distal portion of left anterior descending (LAD) coronary artery ([place])    Cardiogenic shock ([place])    Type 2 diabetes mellitus with diabetic nephropathy ([place]) 06/18/2020   Healthcare maintenance 09/16/2017   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   Obstructive sleep apnea 08/05/2017   Hyperlipidemia 06/03/2017   Acute on chronic systolic heart failure ([place]) 06/03/2017   Chronic kidney disease, stage 3 ([place]) 06/03/2017   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[place]) 06/03/2017   Essential hypertension 06/03/2017   Atherosclerotic heart disease of native coronary artery with unstable angina pectoris ([place]) 06/03/2017   Ex-smoker 06/03/2017   PCP:  Lillard Anes, MD Pharmacy:   [facility], [place] [address] [place]  [place] [address] Phone: [phone] Fax: [phone]  [facility] [place], [place] - [address] [place] [address] Phone: [phone] Fax: [facility] [address] [place] [address] Phone: [phone] Fax: [phone]     Social Determinants of Health (SDOH) Interventions Food Insecurity Interventions: Assist with SNAP Application Financial Strain Interventions: Intervention Not Indicated Housing Interventions: Intervention Not  Indicated Transportation Interventions: Intervention Not Indicated  Readmission Risk Interventions No flowsheet data found.

## 2020-07-21 LAB — CBC WITH DIFFERENTIAL/PLATELET
Abs Immature Granulocytes: 0.03 10*3/uL (ref 0.00–0.07)
Basophils Absolute: 0 10*3/uL (ref 0.0–0.1)
Basophils Relative: 0 %
Eosinophils Absolute: 0 10*3/uL (ref 0.0–0.5)
Eosinophils Relative: 0 %
HCT: 31.1 % — ABNORMAL LOW (ref 39.0–52.0)
Hemoglobin: 10.4 g/dL — ABNORMAL LOW (ref 13.0–17.0)
Immature Granulocytes: 0 %
Lymphocytes Relative: 4 %
Lymphs Abs: 0.3 10*3/uL — ABNORMAL LOW (ref 0.7–4.0)
MCH: 29 pg (ref 26.0–34.0)
MCHC: 33.4 g/dL (ref 30.0–36.0)
MCV: 86.6 fL (ref 80.0–100.0)
Monocytes Absolute: 0.3 10*3/uL (ref 0.1–1.0)
Monocytes Relative: 5 %
Neutro Abs: 6.4 10*3/uL (ref 1.7–7.7)
Neutrophils Relative %: 91 %
Platelets: 103 10*3/uL — ABNORMAL LOW (ref 150–400)
RBC: 3.59 MIL/uL — ABNORMAL LOW (ref 4.22–5.81)
RDW: 18.6 % — ABNORMAL HIGH (ref 11.5–15.5)
WBC: 7 10*3/uL (ref 4.0–10.5)
nRBC: 0 % (ref 0.0–0.2)

## 2020-07-21 LAB — MAGNESIUM: Magnesium: 2.2 mg/dL (ref 1.7–2.4)

## 2020-07-21 LAB — COMPREHENSIVE METABOLIC PANEL
ALT: 19 U/L (ref 0–44)
AST: 29 U/L (ref 15–41)
Albumin: 2.6 g/dL — ABNORMAL LOW (ref 3.5–5.0)
Alkaline Phosphatase: 66 U/L (ref 38–126)
Anion gap: 7 (ref 5–15)
BUN: 31 mg/dL — ABNORMAL HIGH (ref 8–23)
CO2: 23 mmol/L (ref 22–32)
Calcium: 8.4 mg/dL — ABNORMAL LOW (ref 8.9–10.3)
Chloride: 104 mmol/L (ref 98–111)
Creatinine, Ser: 1.62 mg/dL — ABNORMAL HIGH (ref 0.61–1.24)
GFR, Estimated: 45 mL/min — ABNORMAL LOW (ref >60)
Glucose, Bld: 172 mg/dL — ABNORMAL HIGH (ref 70–99)
Potassium: 4.2 mmol/L (ref 3.5–5.1)
Sodium: 134 mmol/L — ABNORMAL LOW (ref 135–145)
Total Bilirubin: 0.7 mg/dL (ref 0.3–1.2)
Total Protein: 5.6 g/dL — ABNORMAL LOW (ref 6.5–8.1)

## 2020-07-21 LAB — GLUCOSE, CAPILLARY
Glucose-Capillary: 145 mg/dL — ABNORMAL HIGH (ref 70–99)
Glucose-Capillary: 119 mg/dL — ABNORMAL HIGH (ref 70–99)
Glucose-Capillary: 165 mg/dL — ABNORMAL HIGH (ref 70–99)
Glucose-Capillary: 247 mg/dL — ABNORMAL HIGH (ref 70–99)

## 2020-07-21 LAB — BRAIN NATRIURETIC PEPTIDE: B Natriuretic Peptide: 2769.5 pg/mL — ABNORMAL HIGH (ref 0.0–100.0)

## 2020-07-21 LAB — DIGOXIN LEVEL: Digoxin Level: 1.2 ng/mL (ref 0.8–2.0)

## 2020-07-21 LAB — URIC ACID: Uric Acid, Serum: 6.9 mg/dL (ref 3.7–8.6)

## 2020-07-21 MED ORDER — SPIRONOLACTONE 12.5 MG HALF TABLET
12.5000 mg | ORAL_TABLET | Freq: Every day | ORAL | Status: DC
  Administered 2020-07-21 – 2020-07-22 (×2): 12.5 mg via ORAL
  Filled 2020-07-21 (×3): qty 1

## 2020-07-21 MED ORDER — METHYLPREDNISOLONE SODIUM SUCC 40 MG IJ SOLR
40.0000 mg | Freq: Once | INTRAMUSCULAR | Status: AC
  Administered 2020-07-21: 40 mg via INTRAVENOUS
  Filled 2020-07-21: qty 1

## 2020-07-21 MED ORDER — TORSEMIDE 20 MG PO TABS
20.0000 mg | ORAL_TABLET | Freq: Every day | ORAL | Status: DC
  Administered 2020-07-22 – 2020-07-23 (×2): 20 mg via ORAL
  Filled 2020-07-21 (×2): qty 1

## 2020-07-21 NOTE — Progress Notes (Signed)
Physical Therapy Treatment Patient Details Name: Douglas Villa MRN: [id number] DOB: 02/26/1950 Today's Date: 07/21/2020    History of Present Illness 70 y/o male admitted secondary to dizziness, and weakness. Pt with bed bugs upon presentation as well. Found to have AKI and hyperkalemia. PMH includes CHF, CAD, HTN, COPD, OSA, and COPD.    PT Comments    Patient received in bed, pleasant and cooperative- still a bit fearful of moving due to dizziness however. Able to mobilize on a min guard basis with RW but limited to in room mobility due to ongoing dizziness as well as ongoing R toe soreness. Unable to get accurate walking BP due to gripping RW tightly, walking HR WNL. Left up in recliner with all needs met, BP 130s/70s,  chair alarm active and nursing staff aware of patient status. May benefit from SNF, sounds like assist at home may be limited. Will continue to follow.     07/21/20 0944  Vital Signs  Patient Position (if appropriate) Orthostatic Vitals  Orthostatic Lying   BP- Lying 127/71  Pulse- Lying 60  Orthostatic Sitting  BP- Sitting 125/79  Pulse- Sitting 63  Orthostatic Standing at 0 minutes  BP- Standing at 0 minutes 132/79  Pulse- Standing at 0 minutes 65  Orthostatic Standing at 3 minutes  BP- Standing at 3 minutes 135/81  Pulse- Standing at 3 minutes 62     Follow Up Recommendations  SNF     Equipment Recommendations  3in1 (PT);Rolling walker with 5" wheels    Recommendations for Other Services       Precautions / Restrictions Precautions Precautions: Fall;Other (comment) Precaution Comments: orthostatic Restrictions Weight Bearing Restrictions: No    Mobility  Bed Mobility Overal bed mobility: Needs Assistance Bed Mobility: Supine to Sit     Supine to sit: Modified independent (Device/Increase time);HOB elevated     General bed mobility comments: use of rail, HOB elevated    Transfers Overall transfer level: Needs assistance Equipment  used: None Transfers: Sit to/from Stand Sit to Stand: Min guard         General transfer comment: min guard for safety, increaesed time, orthostatics negative today but still symptomatic and dizzy  Ambulation/Gait Ambulation/Gait assistance: Min guard Gait Distance (Feet): 20 Feet Assistive device: Rolling walker (2 wheeled) Gait Pattern/deviations: Step-through pattern;Trunk flexed     General Gait Details: slow and cautious, dizzy with gait in room; unable to get accurate walking BP due to gripping RW   Stairs             Wheelchair Mobility    Modified Rankin (Stroke Patients Only)       Balance Overall balance assessment: Mild deficits observed, not formally tested                                          Cognition Arousal/Alertness: Awake/alert Behavior During Therapy: WFL for tasks assessed/performed Overall Cognitive Status: Within Functional Limits for tasks assessed                                 General Comments: pleasant and cooperative, but fearful of being dizzy      Exercises      General Comments        Pertinent Vitals/Pain Pain Assessment: Faces Faces Pain Scale: Hurts a little bit Pain Location:  R foot/toe Pain Descriptors / Indicators: Sore Pain Intervention(s): Limited activity within patient's tolerance;Monitored during session    Home Living                      Prior Function            PT Goals (current goals can now be found in the care plan section) Acute Rehab PT Goals Patient Stated Goal: to feel better PT Goal Formulation: With patient Time For Goal Achievement: 07/31/20 Potential to Achieve Goals: Good Progress towards PT goals: Progressing toward goals    Frequency    Min 3X/week      PT Plan Discharge plan needs to be updated;Equipment recommendations need to be updated    Co-evaluation              AM-PAC PT "6 Clicks" Mobility   Outcome Measure   Help needed turning from your back to your side while in a flat bed without using bedrails?: None Help needed moving from lying on your back to sitting on the side of a flat bed without using bedrails?: None Help needed moving to and from a bed to a chair (including a wheelchair)?: A Little Help needed standing up from a chair using your arms (e.g., wheelchair or bedside chair)?: A Little Help needed to walk in hospital room?: A Little Help needed climbing 3-5 steps with a railing? : A Lot 6 Click Score: 19    End of Session Equipment Utilized During Treatment: Gait belt Activity Tolerance: Patient tolerated treatment well Patient left: in chair;with call bell/phone within reach;with chair alarm set Nurse Communication: Mobility status (BP during session) PT Visit Diagnosis: Other abnormalities of gait and mobility (R26.89);Difficulty in walking, not elsewhere classified (R26.2)     Time: 3536-1443 PT Time Calculation (min) (ACUTE ONLY): 29 min  Charges:  $Gait Training: 8-22 mins $Therapeutic Activity: 8-22 mins                    Windell Norfolk, DPT, PN1   Supplemental Physical Therapist [place]    Pager [phone] Acute Rehab Office [phone]

## 2020-07-21 NOTE — Progress Notes (Addendum)
PROGRESS NOTE        PATIENT DETAILS Name: Douglas Villa Age: 70 y.o. Sex: male Date of Birth: 07/20/1950 Admit Date: 07/17/2020 Admitting Physician Lequita Halt, MD [id number], [name], MD  Brief Narrative: Patient is a 70 y.o. male with history of recent STEMI-s/p PCI of distal LAD on 5/20, HFrEF, atrial flutter, CKD stage IV-who was seen at CHF clinic yesterday-and was found to be profoundly weak, orthostatic-he was subsequently referred to the ED where he was found to have acute kidney injury.  Significant events: 6/16>> admit for AKI 6/17>> digoxin level 3.2  Significant studies: 6/16>> CXR: No pneumonia  Antimicrobial therapy: None  Microbiology data: 6/16>> COVID/influenza PCR: Negative  Procedures : None  Consults: Cardiology, nephrology  DVT Prophylaxis :Place TED hose Start: 07/20/20 0931 apixaban (ELIQUIS) tablet 5 mg    Subjective:  Patient in bed, at times mildly dizzy on standing up, no headache chest or abdominal pain, does have pain in his right big toe which is consistent with his previous gout attacks.   Assessment/Plan:  AKI on CKD stage IV: AKI hemodynamically mediated-improving with IV fluids, renal ultrasound nonacute, baseline creatinine around 2, renal function improving, nephrology on board.  Hyperkalemia: Resolved.  Digoxin toxicity: Nausea is better, received DigiFab on 07/18/2020, digoxin level coming down and now near normal levels on 10/20/2020, evaluated by CHF team who are following, heart rate blood pressure gradually improving, gradually improving, will advance activity and look for appropriate chronotropic response.  Chronic systolic heart failure EF 20 to 25%: Volume status stable-in fact was on the dry side yesterday and seemed IV fluids, CHF team is following, in the past he has refused LifeVest, at times get dizzy on standing up, diuretics on hold, cardiac medications deferred to CHF team,  nephrology following volume status i.e. IV fluids and diuretics.  CAD - Recent admission 5/22 for CGS in setting of acute STEMI requiring PCI + IABP support. Remains on  Plavix/statin-not on beta-blocker per cardiology-due to low flow state.Per Cards DC ASA 07/27/20.  Atrial flutter: Remains in atrial flutter this morning-rate controlled-digoxin levels supratherapeutic-amiodarone held on admission-remains anticoagulated with Eliquis.  Await further input from cardiology  HTN: BP proving after hydration with IV fluids continue to monitor.  Apply TED stockings for now.  Acute gout attack.  Right big toe tender.  Consistent with his previous gout attacks, check uric acid, Solu-Medrol IV couple of doses, already responding well to Solu-Medrol.    DM-2 (A1C 7.7 on 5/21): CBG stable-continue SSI-oral hypoglycemics remain on hold  Diet: Diet Order             Diet renal/carb modified with fluid restriction Diet-HS Snack? Nothing; Fluid restriction: 1200 mL Fluid; Room service appropriate? Yes; Fluid consistency: Thin  Diet effective now                    Code Status: Full code  Family Communication: None at bedside   Disposition Plan: Status is: Inpatient  Remains inpatient appropriate because:Inpatient level of care appropriate due to severity of illness  Dispo: The patient is from: Home              Anticipated d/c is to: Home              Patient currently is not medically stable to d/c.   Difficult to place  patient No     Barriers to Discharge: Improving renal function-noted at baseline-digoxin toxicity-requiring DigiFab.  Antimicrobial agents: Anti-infectives (From admission, onward)    None        Time spent: 45 minutes-Greater than 50% of this time was spent in counseling, explanation of diagnosis, planning of further management, and coordination of care.  MEDICATIONS: Scheduled Meds:  apixaban  5 mg Oral BID   aspirin EC  81 mg Oral Daily   atorvastatin   80 mg Oral Daily   bisacodyl  10 mg Rectal Once   clopidogrel  75 mg Oral Daily   docusate sodium  100 mg Oral BID   insulin aspart  0-9 Units Subcutaneous TID WC   pantoprazole  40 mg Oral Q1200   polyethylene glycol  17 g Oral BID   sodium chloride flush  3 mL Intravenous Q12H   spironolactone  12.5 mg Oral Daily   torsemide  20 mg Oral Daily   Continuous Infusions:   PRN Meds:.acetaminophen, albuterol, fentaNYL (SUBLIMAZE) injection, hydrALAZINE, ondansetron (ZOFRAN) IV, polyvinyl alcohol, traMADol   PHYSICAL EXAM: Vital signs: Vitals:   07/21/20 1936 07/21/20 2321 07/22/20 0321 07/22/20 0514  BP: 108/61 125/76 126/73   Pulse: 100 61 (!) 56   Resp: 18 18 20    Temp: 98 F (36.7 C) 97.6 F (36.4 C) 98.2 F (36.8 C)   TempSrc: Oral Oral Oral   SpO2: 100% 100% 100%   Weight:    80 kg  Height:       Filed Weights   07/20/20 0500 07/21/20 0433 07/22/20 0514  Weight: 77.1 kg 77.1 kg 80 kg   Body mass index is 27.62 kg/m.   Gen Exam:  Awake Alert, No new F.N deficits, Normal affect [place].AT,PERRAL Supple Neck,No JVD, No cervical lymphadenopathy appriciated.  Symmetrical Chest wall movement, Good air movement bilaterally, CTAB RRR,No Gallops, Rubs or new Murmurs, No Parasternal Heave +ve B.Sounds, Abd Soft, No tenderness, No organomegaly appriciated, No rebound - guarding or rigidity. No Cyanosis, right big toe tender    I have personally reviewed following labs and imaging studies  LABORATORY DATA: CBC: Recent Labs  Lab 07/17/20 1238 07/20/20 0109 07/21/20 0706 07/22/20 0109  WBC 9.9 4.8 7.0 9.5  NEUTROABS 8.8* 3.4 6.4 9.0*  HGB 15.7 10.1* 10.4* 10.5*  HCT 47.8 31.8* 31.1* 32.0*  MCV 85.7 88.3 86.6 87.4  PLT 303 109* 103* 113*    Basic Metabolic Panel: Recent Labs  Lab 07/18/20 0034 07/19/20 0208 07/20/20 0109 07/21/20 0706 07/22/20 0109  NA 132* 136 138 134* 134*  K 4.3 4.3 4.2 4.2 4.6  CL 99 106 106 104 103  CO2 22 22 25 23 24   GLUCOSE 149*  130* 117* 172* 215*  BUN 137* 98* 57* 31* 31*  CREATININE 4.54* 3.14* 2.20* 1.62* 1.86*  CALCIUM 8.8* 7.9* 8.0* 8.4* 8.4*  MG  --  2.1 2.0 2.2 2.2    GFR: Estimated Creatinine Clearance: 36.4 mL/min (A) (by C-G formula based on SCr of 1.86 mg/dL (H)).  Liver Function Tests: Recent Labs  Lab 07/20/20 0109 07/21/20 0706 07/22/20 0109  AST 31 29 28   ALT 19 19 23   ALKPHOS 63 66 66  BILITOT 0.7 0.7 0.8  PROT 5.4* 5.6* 5.7*  ALBUMIN 2.6* 2.6* 2.7*   No results for input(s): LIPASE, AMYLASE in the last 168 hours. No results for input(s): AMMONIA in the last 168 hours.  Coagulation Profile: No results for input(s): INR, PROTIME in the last 168 hours.  Cardiac Enzymes: No results for input(s): CKTOTAL, CKMB, CKMBINDEX, TROPONINI in the last 168 hours.  BNP (last 3 results) No results for input(s): PROBNP in the last 8760 hours.  Lipid Profile: No results for input(s): CHOL, HDL, LDLCALC, TRIG, CHOLHDL, LDLDIRECT in the last 72 hours.  Thyroid Function Tests: Recent Labs    07/20/20 0804  TSH 0.964    Anemia Panel: No results for input(s): VITAMINB12, FOLATE, FERRITIN, TIBC, IRON, RETICCTPCT in the last 72 hours.  Urine analysis: No results found for: COLORURINE, APPEARANCEUR, LABSPEC, PHURINE, GLUCOSEU, HGBUR, BILIRUBINUR, KETONESUR, PROTEINUR, UROBILINOGEN, NITRITE, LEUKOCYTESUR  Sepsis Labs: Lactic Acid, Venous    Component Value Date/Time   LATICACIDVEN 1.3 06/20/2020 1433    MICROBIOLOGY: Recent Results (from the past 240 hour(s))  Resp Panel by RT-PCR (Flu A&B, Covid) Nasopharyngeal Swab     Status: None   Collection Time: 07/17/20  6:29 PM   Specimen: Nasopharyngeal Swab; Nasopharyngeal(NP) swabs in vial transport medium  Result Value Ref Range Status   SARS Coronavirus 2 by RT PCR NEGATIVE NEGATIVE Final    Comment: (NOTE) SARS-CoV-2 target nucleic acids are NOT DETECTED.  The SARS-CoV-2 RNA is generally detectable in upper respiratory specimens  during the acute phase of infection. The lowest concentration of SARS-CoV-2 viral copies this assay can detect is 138 copies/mL. A negative result does not preclude SARS-Cov-2 infection and should not be used as the sole basis for treatment or other patient management decisions. A negative result may occur with  improper specimen collection/handling, submission of specimen other than nasopharyngeal swab, presence of viral mutation(s) within the areas targeted by this assay, and inadequate number of viral copies(<138 copies/mL). A negative result must be combined with clinical observations, patient history, and epidemiological information. The expected result is Negative.  Fact Sheet for Patients:  EntrepreneurPulse.com.au  Fact Sheet for Healthcare Providers:  IncredibleEmployment.be  This test is no t yet approved or cleared by the [place] FDA and  has been authorized for detection and/or diagnosis of SARS-CoV-2 by FDA under an Emergency Use Authorization (EUA). This EUA will remain  in effect (meaning this test can be used) for the duration of the COVID-19 declaration under Section 564(b)(1) of the Act, 21 U.S.C.section 360bbb-3(b)(1), unless the authorization is terminated  or revoked sooner.       Influenza A by PCR NEGATIVE NEGATIVE Final   Influenza B by PCR NEGATIVE NEGATIVE Final    Comment: (NOTE) The Xpert Xpress SARS-CoV-2/FLU/RSV plus assay is intended as an aid in the diagnosis of influenza from Nasopharyngeal swab specimens and should not be used as a sole basis for treatment. Nasal washings and aspirates are unacceptable for Xpert Xpress SARS-CoV-2/FLU/RSV testing.  Fact Sheet for Patients: EntrepreneurPulse.com.au  Fact Sheet for Healthcare Providers: IncredibleEmployment.be  This test is not yet approved or cleared by the [place] FDA and has been authorized for detection  and/or diagnosis of SARS-CoV-2 by FDA under an Emergency Use Authorization (EUA). This EUA will remain in effect (meaning this test can be used) for the duration of the COVID-19 declaration under Section 564(b)(1) of the Act, 21 U.S.C. section 360bbb-3(b)(1), unless the authorization is terminated or revoked.  Performed at [facility], [place] [address] [place], [place] [address]     RADIOLOGY STUDIES/RESULTS: No results found.   LOS: 5 days   Signature  Lala Lund M.D on 07/22/2020 at 7:33 AM   -  To page go to [email or web address]

## 2020-07-21 NOTE — TOC Progression Note (Signed)
Transition of Care (TOC) - Progression Note  Heart Failure   Patient Details  Name: Douglas Villa MRN: [id number] Date of Birth: August 01, 1950  Transition of Care [facility]) CM/SW [place], Ali Molina Phone Number: 07/21/2020, 12:56 PM  Clinical Narrative:    CSW received consult for possible SNF placement at time of discharge. CSW spoke with patient who reported he isn't sure about SNF and would like to think about it over night. Patient reported that he lives alone. Patient expressed understanding of PT recommendation and is unsure of SNF placement at time of discharge. CSW discussed insurance authorization process and discussed the PT recommendation and explained to patient about the SNF for short-term rehab. Patient has received the COVID vaccines. Patient expressed being hopeful and to feel better soon. No further questions reported at this time.      Expected Discharge Plan: Skilled Nursing Facility Barriers to Discharge: Continued Medical Work up  Expected Discharge Plan and Services Expected Discharge Plan: [place] In-house Referral: Clinical Social Work Discharge Planning Services: CM Consult   Living arrangements for the past 2 months: Hotel/Motel                                       Social Determinants of Health (SDOH) Interventions Food Insecurity Interventions: Assist with [employer] Application Financial Strain Interventions: Intervention Not Indicated Housing Interventions: Intervention Not Indicated Transportation Interventions: Intervention Not Indicated  Readmission Risk Interventions No flowsheet data found.  Caroljean Monsivais, MSW, [place] Heart Failure Social Worker

## 2020-07-21 NOTE — NC FL2 (Signed)
[place] MEDICAID FL2 LEVEL OF CARE SCREENING TOOL     IDENTIFICATION  Patient Name: Douglas Villa Birthdate: [birth year] Sex: male Admission Date (Current Location): 07/17/2020  [facility] and [place] Number:  Herbalist and Address:  [facility] [place], [place] [address] [place], [place] [address]      Provider Number: [id number]  Attending Physician Name and Address:  Thurnell Lose, MD  Relative Name and Phone Number:  Loretha Brasil, Significant other [phone]    Current Level of Care: Hospital Recommended Level of Care: [place] Prior Approval Number:    Date Approved/Denied:   PASRR Number: [id number]  Discharge Plan: SNF    Current Diagnoses: Patient Active Problem List   Diagnosis Date Noted   AKI (acute kidney injury) ([place]) 07/17/2020   Hyperkalemia    Atrial fibrillation ([place]) 06/29/2020   Tricuspid regurgitation 06/29/2020   Anemia 06/29/2020   Hyponatremia 06/29/2020   Thrombocytopenia ([place]) 06/29/2020   STEMI involving left anterior descending coronary artery ([place]) 06/20/2020   Acute ST elevation myocardial infarction (STEMI) due to occlusion of distal portion of left anterior descending (LAD) coronary artery ([place])    Cardiogenic shock ([place])    Type 2 diabetes mellitus with diabetic nephropathy ([place]) 06/18/2020   Healthcare maintenance 09/16/2017   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   Obstructive sleep apnea 08/05/2017   Hyperlipidemia 06/03/2017   Acute on chronic systolic heart failure ([place]) 06/03/2017   Chronic kidney disease, stage 3 ([place]) 06/03/2017   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[place]) 06/03/2017   Essential hypertension 06/03/2017   Atherosclerotic heart disease of native coronary artery with unstable angina pectoris ([place]) 06/03/2017   Ex-smoker 06/03/2017    Orientation RESPIRATION BLADDER Height & Weight     Self, Time, Situation, Place  Normal Continent Weight: 169 lb 15.6 oz (77.1 kg) Height:  5\' 7"   (170.2 cm)  BEHAVIORAL SYMPTOMS/MOOD NEUROLOGICAL BOWEL NUTRITION STATUS      Continent Diet (Please See D/C Summary)  AMBULATORY STATUS COMMUNICATION OF NEEDS Skin   Limited Assist Verbally Normal                       Personal Care Assistance Level of Assistance  Bathing, Feeding, Dressing Bathing Assistance: Limited assistance Feeding assistance: Independent Dressing Assistance: Limited assistance     Functional Limitations Info  Sight, Hearing, Speech Sight Info: Adequate Hearing Info: Adequate Speech Info: Adequate    SPECIAL CARE FACTORS FREQUENCY  PT (By licensed PT), OT (By licensed OT)     PT Frequency: 5x/week OT Frequency: 5x/week            Contractures Contractures Info: Not present    Additional Factors Info  Code Status, Allergies, Insulin Sliding Scale Code Status Info: Full Allergies Info: Penicillins   Insulin Sliding Scale Info: See DC Summary       Current Medications (07/21/2020):  This is the current hospital active medication list Current Facility-Administered Medications  Medication Dose Route Frequency Provider Last Rate Last Admin   acetaminophen (TYLENOL) tablet 650 mg  650 mg Oral Q4H PRN Wynetta Fines T, MD   650 mg at 07/20/20 0117   albuterol (PROVENTIL) (2.5 MG/3ML) 0.083% nebulizer solution 2.5 mg  2.5 mg Inhalation Q6H PRN Lequita Halt, MD       apixaban Arne [place]) tablet 5 mg  5 mg Oral BID Wynetta Fines T, MD   5 mg at 07/21/20 1059   atorvastatin (LIPITOR) tablet 80 mg  80 mg Oral Daily Wynetta Fines T, MD   80 mg at 07/21/20 1059   bisacodyl (DULCOLAX) suppository 10 mg  10 mg Rectal Once Thurnell Lose, MD       clopidogrel (PLAVIX) tablet 75 mg  75 mg Oral Daily Wynetta Fines T, MD   75 mg at 07/21/20 1059   docusate sodium (COLACE) capsule 100 mg  100 mg Oral BID Thurnell Lose, MD   100 mg at 07/21/20 0017   fentaNYL (SUBLIMAZE) injection 25 mcg  25 mcg Intravenous Q2H PRN Opyd, Ilene Qua, MD       hydrALAZINE  (APRESOLINE) tablet 25 mg  25 mg Oral Q6H PRN Wynetta Fines T, MD       insulin aspart (novoLOG) injection 0-9 Units  0-9 Units Subcutaneous TID WC Wynetta Fines T, MD   1 Units at 07/21/20 0810   methylPREDNISolone sodium succinate (SOLU-MEDROL) 40 mg/mL injection 40 mg  40 mg Intravenous Once Thurnell Lose, MD       ondansetron [facility]) injection 4 mg  4 mg Intravenous Q6H PRN Wynetta Fines T, MD   4 mg at 07/21/20 1059   pantoprazole (PROTONIX) EC tablet 40 mg  40 mg Oral Q1200 Jonetta Osgood, MD   40 mg at 07/20/20 1243   polyethylene glycol (MIRALAX / GLYCOLAX) packet 17 g  17 g Oral BID Thurnell Lose, MD   17 g at 07/20/20 0835   polyvinyl alcohol (LIQUIFILM TEARS) 1.4 % ophthalmic solution 1 drop  1 drop Both Eyes Daily PRN Wynetta Fines T, MD       sodium chloride flush (NS) 0.9 % injection 3 mL  3 mL Intravenous Q12H Wynetta Fines T, MD   3 mL at 07/21/20 1059   traMADol (ULTRAM) tablet 50 mg  50 mg Oral Q12H PRN Thurnell Lose, MD   50 mg at 07/20/20 1423     Discharge Medications: Please see discharge summary for a list of discharge medications.  Relevant Imaging Results:  Relevant Lab Results:   Additional Information SSN#: [id number] [place] vaccines on 05/17/19, 01/04/20, 06/17/20  Ronan Dion, LCSWA

## 2020-07-21 NOTE — Progress Notes (Addendum)
Advanced Heart Failure Rounding Note   Subjective:    Dig level 3.2>>2.6>>1.7>>1.2  AKI improved w/ IVFs, SCr 5.5 -> 3.1->2.2->1.6   K 4.2   BNP up trending, 391 on admit>>1,426>>2,769. Wt charted as 11 lb since admit.   Denies dyspnea, still feels dizzy occasionally w/ standing. SBPs 120s-130s. Remains in AFL, HR mid 50s-low 60s. Also complaining of nausea, which he attributes to steroids given for Rt great toe gout flare.    Objective:   Weight Range:  Vital Signs:   Temp:  [97.5 F (36.4 C)-98.2 F (36.8 C)] 97.6 F (36.4 C) (06/20 0737) Pulse Rate:  [50-59] 54 (06/20 0737) Resp:  [17-19] 18 (06/20 0737) BP: (97-127)/(52-78) 125/78 (06/20 0737) SpO2:  [99 %-100 %] 100 % (06/20 0737) Weight:  [77.1 kg] 77.1 kg (06/20 0433) Last BM Date: 07/21/20  Weight change: Filed Weights   07/18/20 0300 07/20/20 0500 07/21/20 0433  Weight: 71.9 kg 77.1 kg 77.1 kg    Intake/Output:   Intake/Output Summary (Last 24 hours) at 07/21/2020 0948 Last data filed at 07/21/2020 0900 Gross per 24 hour  Intake 938 ml  Output 1225 ml  Net -287 ml     PHYSICAL EXAM: General:  fatigued appearing.  Sitting up in chair. No respiratory difficulty HEENT: normal Neck: supple. no JVD. Carotids 2+ bilat; no bruits. No lymphadenopathy or thyromegaly appreciated. Cor: PMI nondisplaced. Irregular rhythm, slow rate. No rubs, gallops or murmurs. Lungs: clear Abdomen: soft, nontender, nondistended. No hepatosplenomegaly. No bruits or masses. Good bowel sounds. Extremities: no cyanosis, clubbing, rash, edema + TED hoses  Neuro: alert & oriented x 3, cranial nerves grossly intact. moves all 4 extremities w/o difficulty. Affect pleasant.   Telemetry: AFL mid 50s-low 60s  Personally reviewed   Labs: Basic Metabolic Panel: Recent Labs  Lab 07/17/20 2117 07/18/20 0034 07/19/20 0208 07/20/20 0109 07/21/20 0706  NA 133* 132* 136 138 134*  K 5.3* 4.3 4.3 4.2 4.2  CL 95* 99 106 106 104   CO2 25 22 22 25 23   GLUCOSE 220* 149* 130* 117* 172*  BUN 143* 137* 98* 57* 31*  CREATININE 5.00* 4.54* 3.14* 2.20* 1.62*  CALCIUM 9.1 8.8* 7.9* 8.0* 8.4*  MG  --   --  2.1 2.0 2.2    Liver Function Tests: Recent Labs  Lab 07/20/20 0109 07/21/20 0706  AST 31 29  ALT 19 19  ALKPHOS 63 66  BILITOT 0.7 0.7  PROT 5.4* 5.6*  ALBUMIN 2.6* 2.6*   No results for input(s): LIPASE, AMYLASE in the last 168 hours. No results for input(s): AMMONIA in the last 168 hours.  CBC: Recent Labs  Lab 07/17/20 1238 07/20/20 0109 07/21/20 0706  WBC 9.9 4.8 7.0  NEUTROABS 8.8* 3.4 6.4  HGB 15.7 10.1* 10.4*  HCT 47.8 31.8* 31.1*  MCV 85.7 88.3 86.6  PLT 303 109* 103*    Cardiac Enzymes: No results for input(s): CKTOTAL, CKMB, CKMBINDEX, TROPONINI in the last 168 hours.  BNP: BNP (last 3 results) Recent Labs    07/17/20 1238 07/20/20 0109 07/21/20 0706  BNP 391.9* 1,426.1* 2,769.5*    ProBNP (last 3 results) No results for input(s): PROBNP in the last 8760 hours.    Other results:  Imaging: No results found.   Medications:     Scheduled Medications:  apixaban  5 mg Oral BID   aspirin EC  81 mg Oral Daily   atorvastatin  80 mg Oral Daily   bisacodyl  10 mg Rectal  Once   clopidogrel  75 mg Oral Daily   docusate sodium  100 mg Oral BID   insulin aspart  0-9 Units Subcutaneous TID WC   pantoprazole  40 mg Oral Q1200   polyethylene glycol  17 g Oral BID   sodium chloride flush  3 mL Intravenous Q12H    Infusions:    PRN Medications: acetaminophen, albuterol, fentaNYL (SUBLIMAZE) injection, hydrALAZINE, ondansetron (ZOFRAN) IV, polyvinyl alcohol, traMADol   Assessment:/Plan    Elevate Dig Level/ Dig Toxicity - Dig level 2.5>>3.2 -> treated with digibind -> 2.6-1.7->1.2 - Remains mildly bradycardic but no longer appears toxic    2. AFlutter: Atypical. Diagnosed previous admit 5/22. S/p TEE/DC-CV on 5/27. Back in AFL, V-rates currently 50s - Dig  discontinued - Monitor rate on tele - C/w Eliquis - Can consider repeat DC-CV at some point but will need amio and currently not candidate with bradycardia.    3. AKI on CKD 4 - baseline Scr ~ 1.9-2.1.  - 5.5 on admit. Likely pre-renal from volume depletion - improved w/ IVF hydration. Now off  - SCr 5.5 -> 3.1->2.2->1.6  - SBPs 120s-130s - Will start to re-introduce HF meds slowly, add spiro 12.5 mg today and follow BMP     5. Hyperkalemia - 6.0 on admit, in setting of AKI - corrected w/ insulin, lokelma and calcium gluconate, 4.2 today     6. CAD:   - H/o DES to LCX in 3/19, had occluded PLV at that time. Recently admitted 5/22 with anterior STEMI and occluded mLAD, had DES to mLAD, known CTO PLV with collaterals and patent LCx stent.  No s/s ischemia - Continue ASA 81, Plavix. Will drop aspirin after 07/27/20, as he is also on apixaban. - Continue atorvastatin 80 mg daily.   7. 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  Recent admission 5/22 for CGS in setting of acute STEMI requiring PCI + IABP support. Echo 5/22 showed EF 20-25%, moderately decreased RV function with mild RVE, moderate biatrial enlargement, PASP 34, mild MR, mild TR, IVC normal. Stabilized and weaned off IABP to GDMT. Refused LifeVest at d/c. Now admitted for volume depletion and AKI. Improved w/ IVFs. Scr back to baseline. BNP trending up, 2,769 today  - Will start to re-introduce HF meds slowly - Torsemide 20 mg (taking 40 mg at home) - Spiro 12.5 mg at bedtime, follow BMP  - Continue to hold Farxiga, Hydarl and Imdur for now  - Off digoxin w/ elevated level     Length of Stay: [address], PA-C  07/21/2020, 9:48 AM  Advanced Heart Failure Team Pager [phone] (M-F; 7a - 4p)  Please contact [facility] for night-coverage after hours (4p -7a ) and weekends on [email or web address]  Patient seen and examined  with the above-signed Advanced Practice Provider and/or Housestaff. I personally reviewed laboratory data, imaging studies and relevant notes. I independently examined the patient and formulated the important aspects of the plan. I have edited the note to reflect any of my changes or salient points. I have personally discussed the plan with the patient and/or family.   Agree with above. Still dizzy. Denies SOB  General:  lying in bed  No resp difficulty HEENT: normal Neck: supple. JVP 8-9. Carotids 2+ bilat; no bruits. No lymphadenopathy or thryomegaly appreciated. Cor: PMI nondisplaced. Irregular rate & rhythm. No rubs, gallops or murmurs. Lungs: clear Abdomen: soft,  nontender, nondistended. No hepatosplenomegaly. No bruits or masses. Good bowel sounds. Extremities: no cyanosis, clubbing, rash, edema Neuro: alert & orientedx3, cranial nerves grossly intact. moves all 4 extremities w/o difficulty. Affect pleasant  FTN ok. No nsytagmu  Volume status looks ok. HR improving. He continues to complain of dizziness but orthostatics are ok and neuro exam unremarkable. Agree with managing HF meds as above. He is ready for d/c from our point of view. Can further titrate HF meds and consider repeat DC-CV as outpatient. PT recommending SNF.   Glori Bickers, MD  6:48 PM

## 2020-07-22 LAB — CBC WITH DIFFERENTIAL/PLATELET
Abs Immature Granulocytes: 0.03 10*3/uL (ref 0.00–0.07)
Basophils Absolute: 0 10*3/uL (ref 0.0–0.1)
Basophils Relative: 0 %
Eosinophils Absolute: 0 10*3/uL (ref 0.0–0.5)
Eosinophils Relative: 0 %
HCT: 32 % — ABNORMAL LOW (ref 39.0–52.0)
Hemoglobin: 10.5 g/dL — ABNORMAL LOW (ref 13.0–17.0)
Immature Granulocytes: 0 %
Lymphocytes Relative: 3 %
Lymphs Abs: 0.3 10*3/uL — ABNORMAL LOW (ref 0.7–4.0)
MCH: 28.7 pg (ref 26.0–34.0)
MCHC: 32.8 g/dL (ref 30.0–36.0)
MCV: 87.4 fL (ref 80.0–100.0)
Monocytes Absolute: 0.2 10*3/uL (ref 0.1–1.0)
Monocytes Relative: 2 %
Neutro Abs: 9 10*3/uL — ABNORMAL HIGH (ref 1.7–7.7)
Neutrophils Relative %: 95 %
Platelets: 113 10*3/uL — ABNORMAL LOW (ref 150–400)
RBC: 3.66 MIL/uL — ABNORMAL LOW (ref 4.22–5.81)
RDW: 18.8 % — ABNORMAL HIGH (ref 11.5–15.5)
WBC: 9.5 10*3/uL (ref 4.0–10.5)
nRBC: 0 % (ref 0.0–0.2)

## 2020-07-22 LAB — GLUCOSE, CAPILLARY
Glucose-Capillary: 161 mg/dL — ABNORMAL HIGH (ref 70–99)
Glucose-Capillary: 98 mg/dL (ref 70–99)
Glucose-Capillary: 219 mg/dL — ABNORMAL HIGH (ref 70–99)
Glucose-Capillary: 294 mg/dL — ABNORMAL HIGH (ref 70–99)

## 2020-07-22 LAB — BRAIN NATRIURETIC PEPTIDE: B Natriuretic Peptide: 2071.8 pg/mL — ABNORMAL HIGH (ref 0.0–100.0)

## 2020-07-22 LAB — COMPREHENSIVE METABOLIC PANEL
ALT: 23 U/L (ref 0–44)
AST: 28 U/L (ref 15–41)
Albumin: 2.7 g/dL — ABNORMAL LOW (ref 3.5–5.0)
Alkaline Phosphatase: 66 U/L (ref 38–126)
Anion gap: 7 (ref 5–15)
BUN: 31 mg/dL — ABNORMAL HIGH (ref 8–23)
CO2: 24 mmol/L (ref 22–32)
Calcium: 8.4 mg/dL — ABNORMAL LOW (ref 8.9–10.3)
Chloride: 103 mmol/L (ref 98–111)
Creatinine, Ser: 1.86 mg/dL — ABNORMAL HIGH (ref 0.61–1.24)
GFR, Estimated: 38 mL/min — ABNORMAL LOW (ref >60)
Glucose, Bld: 215 mg/dL — ABNORMAL HIGH (ref 70–99)
Potassium: 4.6 mmol/L (ref 3.5–5.1)
Sodium: 134 mmol/L — ABNORMAL LOW (ref 135–145)
Total Bilirubin: 0.8 mg/dL (ref 0.3–1.2)
Total Protein: 5.7 g/dL — ABNORMAL LOW (ref 6.5–8.1)

## 2020-07-22 LAB — SARS CORONAVIRUS 2 (TAT 6-24 HRS): SARS Coronavirus 2: NEGATIVE

## 2020-07-22 LAB — MAGNESIUM: Magnesium: 2.2 mg/dL (ref 1.7–2.4)

## 2020-07-22 MED ORDER — METHYLPREDNISOLONE SODIUM SUCC 40 MG IJ SOLR
40.0000 mg | Freq: Once | INTRAMUSCULAR | Status: AC
  Administered 2020-07-22: 40 mg via INTRAVENOUS
  Filled 2020-07-22: qty 1

## 2020-07-22 MED ORDER — ASPIRIN EC 81 MG PO TBEC
81.0000 mg | DELAYED_RELEASE_TABLET | Freq: Every day | ORAL | Status: DC
  Administered 2020-07-22 – 2020-07-23 (×2): 81 mg via ORAL
  Filled 2020-07-22 (×2): qty 1

## 2020-07-22 MED ORDER — SPIRONOLACTONE 25 MG PO TABS
25.0000 mg | ORAL_TABLET | Freq: Every day | ORAL | Status: DC
  Administered 2020-07-23: 25 mg via ORAL
  Filled 2020-07-22: qty 1

## 2020-07-22 MED ORDER — DAPAGLIFLOZIN PROPANEDIOL 10 MG PO TABS
10.0000 mg | ORAL_TABLET | Freq: Every day | ORAL | Status: DC
  Administered 2020-07-23: 10 mg via ORAL
  Filled 2020-07-22: qty 1

## 2020-07-22 NOTE — TOC Progression Note (Addendum)
Transition of Care (TOC) - Progression Note  Heart Failure   Patient Details  Name: TEDDRICK MALLARI MRN: [id number] Date of Birth: September 28, 1950  Transition of Care [facility]) CM/SW [place], [place] Phone Number: 07/22/2020, 9:31 AM  Clinical Narrative:    CSW received consult for possible SNF placement at time of discharge for Mr. Buckwalter and spoke with him about the bed offers for rehab. Mr. Radu expressed understanding of PT recommendation and is agreeable to SNF placement at time of discharge and reports a preference for [facility] or somewhere closer to home as he resides in [place], [place] provided Medicare SNF ratings list. Patient reported seeing the attending doctor this morning and that he may discharge tomorrow but probably not today. CSW and patient discussed about transportation to the SNF and if he would like the CSW to arrange transportation or if he has a friend or family that he would like to take him. Mr. Teuscher reported that he will talk with his significant other and see if she is able to take him after she gets off work but will find out and let the CSW know if transportation needs to be arranged. CSW will check back with Mr. Rauls and keep him updated through discharge.  CSW outreached [facility] and they reported not having any beds available at this time. CSW informed Mr. Spengler of this and discussed other SNF options including [facility]. CSW spoke with [facility] and they reported needing to review patients clinicals and will let the CSW know about bed availability for rehab. [facility] reported they do have a bed available and can take Mr. Cervi at time of discharge. CSW notified Mr. Orihuela regarding [facility] and he is agreeable of SNF placement. CSW sent a secure chat to the attending MD for a COVID test for the SNF.  CSW will follow up with Mr. Bart about transportation at time of discharge as he still has not heard from his  significant other yet.  CSW will continue to follow throughout discharge.    Expected Discharge Plan: Skilled Nursing Facility Barriers to Discharge: Continued Medical Work up  Expected Discharge Plan and Services Expected Discharge Plan: [place] In-house Referral: Clinical Social Work Discharge Planning Services: CM Consult   Living arrangements for the past 2 months: Hotel/Motel                                       Social Determinants of Health (SDOH) Interventions Food Insecurity Interventions: Assist with [employer] Application Financial Strain Interventions: Intervention Not Indicated Housing Interventions: Intervention Not Indicated Transportation Interventions: Intervention Not Indicated  Readmission Risk Interventions No flowsheet data found.  Regginald Pask, MSW, [place] Heart Failure Social Worker

## 2020-07-22 NOTE — Progress Notes (Signed)
Physical Therapy Treatment Patient Details Name: Douglas Villa MRN: [id number] DOB: 1950-10-08 Today's Date: 07/22/2020    History of Present Illness 70 y/o male admitted secondary to dizziness, and weakness. Pt with bed bugs upon presentation as well. Found to have AKI and hyperkalemia. PMH includes CHF, CAD, HTN, COPD, OSA, and COPD.    PT Comments    Patient received in recliner, pleasant and motivated to work with therapy. Orthostatics negative/BP stable today. Per vestibular screening, do not feel he is experiencing BPPV or other vestibular involvement. Tolerated progression of gait training well today, but still grossly weak. Left up in recliner with all needs met. Will continue to follow.     Follow Up Recommendations  SNF     Equipment Recommendations  3in1 (PT);Rolling walker with 5" wheels    Recommendations for Other Services       Precautions / Restrictions Precautions Precautions: Fall Restrictions Weight Bearing Restrictions: No    Mobility  Bed Mobility               General bed mobility comments: OOB in recliner    Transfers Overall transfer level: Needs assistance Equipment used: Rolling walker (2 wheeled) Transfers: Sit to/from Stand Sit to Stand: Supervision         General transfer comment: S for safety, increased time due to dizziness  Ambulation/Gait Ambulation/Gait assistance: Min guard Gait Distance (Feet): 160 Feet Assistive device: Rolling walker (2 wheeled) Gait Pattern/deviations: Step-through pattern;Trunk flexed Gait velocity: decreased   General Gait Details: slow and steady with RW, but able to tolerate much further gait distance as toe pain had improved today. Minimal dizziness with gait.   Stairs             Wheelchair Mobility    Modified Rankin (Stroke Patients Only)       Balance Overall balance assessment: Mild deficits observed, not formally tested                                           Cognition Arousal/Alertness: Awake/alert Behavior During Therapy: WFL for tasks assessed/performed Overall Cognitive Status: Within Functional Limits for tasks assessed                                 General Comments: pleasant and cooperative, motivated to improve      Exercises      General Comments General comments (skin integrity, edema, etc.): orthostatics negative today      Pertinent Vitals/Pain Pain Assessment: Faces Faces Pain Scale: No hurt Pain Intervention(s): Limited activity within patient's tolerance;Monitored during session    Home Living                      Prior Function            PT Goals (current goals can now be found in the care plan section) Acute Rehab PT Goals Patient Stated Goal: to feel better PT Goal Formulation: With patient Time For Goal Achievement: 07/31/20 Potential to Achieve Goals: Good Progress towards PT goals: Progressing toward goals    Frequency    Min 3X/week      PT Plan Current plan remains appropriate    Co-evaluation              AM-PAC PT "6 Clicks" Mobility  Outcome Measure  Help needed turning from your back to your side while in a flat bed without using bedrails?: None Help needed moving from lying on your back to sitting on the side of a flat bed without using bedrails?: None Help needed moving to and from a bed to a chair (including a wheelchair)?: A Little Help needed standing up from a chair using your arms (e.g., wheelchair or bedside chair)?: A Little Help needed to walk in hospital room?: A Little Help needed climbing 3-5 steps with a railing? : A Little 6 Click Score: 20    End of Session   Activity Tolerance: Patient tolerated treatment well Patient left: in chair;with call bell/phone within reach Nurse Communication: Mobility status PT Visit Diagnosis: Other abnormalities of gait and mobility (R26.89);Difficulty in walking, not elsewhere classified  (R26.2)     Time: 5747-3403 PT Time Calculation (min) (ACUTE ONLY): 25 min  Charges:  $Gait Training: 8-22 mins $Therapeutic Activity: 8-22 mins                    Windell Norfolk, DPT, PN1   Supplemental Physical Therapist [place]    Pager [phone] Acute Rehab Office [phone]

## 2020-07-22 NOTE — Progress Notes (Addendum)
Advanced Heart Failure Rounding Note   Subjective:    Dig level 3.2>>2.6>>1.7>>1.2  AKI improved w/ IVFs, SCr 5.5 -> 3.1->2.2->1.9 c/w baseline.   Remains in AFL w/ SVR mid 40s-50s but actually feeling less dizzy today.  Walked the halls x 2 today. No difficulty. Denies dyspnea and CP. Orthostatics negative.   Objective:   Weight Range:  Vital Signs:   Temp:  [97.6 F (36.4 C)-98.2 F (36.8 C)] 98.2 F (36.8 C) (06/21 0700) Pulse Rate:  [55-100] 55 (06/21 0700) Resp:  [14-20] 18 (06/21 0700) BP: (108-131)/(61-78) 124/68 (06/21 0700) SpO2:  [99 %-100 %] 100 % (06/21 0700) Weight:  [80 kg] 80 kg (06/21 0514) Last BM Date: 07/21/20  Weight change: Filed Weights   07/20/20 0500 07/21/20 0433 07/22/20 0514  Weight: 77.1 kg 77.1 kg 80 kg    Intake/Output:   Intake/Output Summary (Last 24 hours) at 07/22/2020 1214 Last data filed at 07/22/2020 0850 Gross per 24 hour  Intake 480 ml  Output 1400 ml  Net -920 ml     PHYSICAL EXAM: General:  Well appearing. No respiratory difficulty HEENT: normal Neck: supple. JVD 7-8 cm. Carotids 2+ bilat; no bruits. No lymphadenopathy or thyromegaly appreciated. Cor: PMI nondisplaced. Irregular rhythm, slow  rate. No rubs, gallops or murmurs. Lungs: clear Abdomen: soft, nontender, nondistended. No hepatosplenomegaly. No bruits or masses. Good bowel sounds. Extremities: no cyanosis, clubbing, rash, edema Neuro: alert & oriented x 3, cranial nerves grossly intact. moves all 4 extremities w/o difficulty. Affect pleasant.    Telemetry: AFL mid 40-mid 64s  Personally reviewed   Labs: Basic Metabolic Panel: Recent Labs  Lab 07/18/20 0034 07/19/20 0208 07/20/20 0109 07/21/20 0706 07/22/20 0109  NA 132* 136 138 134* 134*  K 4.3 4.3 4.2 4.2 4.6  CL 99 106 106 104 103  CO2 22 22 25 23 24   GLUCOSE 149* 130* 117* 172* 215*  BUN 137* 98* 57* 31* 31*  CREATININE 4.54* 3.14* 2.20* 1.62* 1.86*  CALCIUM 8.8* 7.9* 8.0* 8.4* 8.4*  MG   --  2.1 2.0 2.2 2.2    Liver Function Tests: Recent Labs  Lab 07/20/20 0109 07/21/20 0706 07/22/20 0109  AST 31 29 28   ALT 19 19 23   ALKPHOS 63 66 66  BILITOT 0.7 0.7 0.8  PROT 5.4* 5.6* 5.7*  ALBUMIN 2.6* 2.6* 2.7*   No results for input(s): LIPASE, AMYLASE in the last 168 hours. No results for input(s): AMMONIA in the last 168 hours.  CBC: Recent Labs  Lab 07/17/20 1238 07/20/20 0109 07/21/20 0706 07/22/20 0109  WBC 9.9 4.8 7.0 9.5  NEUTROABS 8.8* 3.4 6.4 9.0*  HGB 15.7 10.1* 10.4* 10.5*  HCT 47.8 31.8* 31.1* 32.0*  MCV 85.7 88.3 86.6 87.4  PLT 303 109* 103* 113*    Cardiac Enzymes: No results for input(s): CKTOTAL, CKMB, CKMBINDEX, TROPONINI in the last 168 hours.  BNP: BNP (last 3 results) Recent Labs    07/20/20 0109 07/21/20 0706 07/22/20 0109  BNP 1,426.1* 2,769.5* 2,071.8*    ProBNP (last 3 results) No results for input(s): PROBNP in the last 8760 hours.    Other results:  Imaging: No results found.   Medications:     Scheduled Medications:  apixaban  5 mg Oral BID   aspirin EC  81 mg Oral Daily   atorvastatin  80 mg Oral Daily   bisacodyl  10 mg Rectal Once   clopidogrel  75 mg Oral Daily   docusate sodium  100  mg Oral BID   insulin aspart  0-9 Units Subcutaneous TID WC   pantoprazole  40 mg Oral Q1200   polyethylene glycol  17 g Oral BID   sodium chloride flush  3 mL Intravenous Q12H   spironolactone  12.5 mg Oral Daily   torsemide  20 mg Oral Daily    Infusions:    PRN Medications: acetaminophen, albuterol, fentaNYL (SUBLIMAZE) injection, hydrALAZINE, ondansetron (ZOFRAN) IV, polyvinyl alcohol, traMADol   Assessment:/Plan    Elevate Dig Level/ Dig Toxicity - Dig level 2.5>>3.2 -> treated with digibind -> 2.6-1.7->1.2 - Remains mildly bradycardic but no longer appears toxic    2. AFlutter: Atypical. Diagnosed previous admit 5/22. S/p TEE/DC-CV on 5/27. Back in AFL, V-rates currently mid 40s-50s but asymptomatic.  -  Dig discontinued - Monitor rate on tele - C/w Eliquis - Can consider repeat DC-CV at some point but will need amio and currently not candidate with bradycardia.    3. AKI on CKD 4 - baseline Scr ~ 1.9-2.1.  - 5.5 on admit. Likely pre-renal from volume depletion - improved w/ IVF hydration. Now off  - SCr 5.5 -> 3.1->2.2->1.9  - SBPs 120s-130s - continue to re-introduce HF meds slowly - c/w spiro + torsemide    5. Hyperkalemia - 6.0 on admit, in setting of AKI - corrected w/ insulin, lokelma and calcium gluconate, 4.6 today     6. CAD:   - H/o DES to LCX in 3/19, had occluded PLV at that time. Recently admitted 5/22 with anterior STEMI and occluded mLAD, had DES to mLAD, known CTO PLV with collaterals and patent LCx stent.  No s/s ischemia - Continue ASA 81, Plavix. Will drop aspirin after 07/27/20, as he is also on apixaban. - Continue atorvastatin 80 mg daily.   7. 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  Recent admission 5/22 for CGS in setting of acute STEMI requiring PCI + IABP support. Echo 5/22 showed EF 20-25%, moderately decreased RV function with mild RVE, moderate biatrial enlargement, PASP 34, mild MR, mild TR, IVC normal. Stabilized and weaned off IABP to GDMT. Refused LifeVest at d/c. Now admitted for volume depletion and AKI. Improved w/ IVFs. Scr back to baseline. Continue to re-introduce HF meds slowly - C/w Torsemide 20 mg  - Increase spiro to 25 mg daily  - Add back Farxiga Hydarl and Imdur soon  - Off digoxin w/ elevated level     Length of Stay: [address], PA-C  07/22/2020, 12:14 PM  Advanced Heart Failure Team Pager [phone] (M-F; 7a - 4p)  Please contact [facility] for night-coverage after hours (4p -7a ) and weekends on [email or web address]   Patient seen and examined with the above-signed Advanced Practice Provider and/or Housestaff. I personally  reviewed laboratory data, imaging studies and relevant notes. I independently examined the patient and formulated the important aspects of the plan. I have edited the note to reflect any of my changes or salient points. I have personally discussed the plan with the patient and/or family.  Feeling better. Less dizzy. BP stable. Not orthostatic. Remains in AF. Rates ok.   General:  Well appearing. No resp difficulty HEENT: normal Neck: supple. no JVD. Carotids 2+ bilat; no bruits. No lymphadenopathy or thryomegaly appreciated. Cor: PMI nondisplaced. Irregular rate & rhythm. No rubs, gallops or murmurs. Lungs: clear Abdomen: soft, nontender, nondistended. No hepatosplenomegaly. No bruits or masses. Good  bowel sounds. Extremities: no cyanosis, clubbing, rash, edema Neuro: alert & orientedx3, cranial nerves grossly intact. moves all 4 extremities w/o difficulty. Affect pleasant   Doing well. Will continue to add back HF meds. I will add back [place] for tomorrow. Would restart hydral at 25 tid and imdur 30 daily at d/c .  HF team will sign off. Please call with questions.   Glori Bickers, MD  3:54 PM

## 2020-07-22 NOTE — Progress Notes (Signed)
PROGRESS NOTE        PATIENT DETAILS Name: Douglas Villa Age: 70 y.o. Sex: male Date of Birth: [birth year] Admit Date: 07/17/2020 Admitting Physician Lequita Halt, MD [id number], [name], MD  Brief Narrative: Patient is a 70 y.o. male with history of recent STEMI-s/p PCI of distal LAD on 5/20, HFrEF, atrial flutter, CKD stage IV-who was seen at CHF clinic yesterday-and was found to be profoundly weak, orthostatic-he was subsequently referred to the ED where he was found to have acute kidney injury.  Significant events: 6/16>> admit for AKI 6/17>> digoxin level 3.2  Significant studies: 6/16>> CXR: No pneumonia  Antimicrobial therapy: None  Microbiology data: 6/16>> COVID/influenza PCR: Negative  Procedures : None  Consults: Cardiology, nephrology  DVT Prophylaxis :Place TED hose Start: 07/20/20 0931 apixaban (ELIQUIS) tablet 5 mg    Subjective:  Patient in bed, appears comfortable, denies any headache, no fever, no chest pain or pressure, no shortness of breath , no abdominal pain. No new focal weakness. R. big toe feels much better.    Assessment/Plan:  AKI on CKD stage IV: AKI hemodynamically mediated-improving with IV fluids, renal ultrasound nonacute, baseline creatinine around 2, renal function improving, nephrology on board.  Hyperkalemia: Resolved.  Digoxin toxicity: Nausea is better, received DigiFab on 07/18/2020, digoxin level coming down and now near normal levels on 10/20/2020, evaluated by CHF team who are following, heart rate blood pressure gradually improving, gradually improving, will advance activity and look for appropriate chronotropic response.  Chronic systolic heart failure EF 20 to 25%: Volume status stable-in fact was on the dry side yesterday and seemed IV fluids, CHF team is following, in the past he has refused LifeVest, at times get dizzy on standing up, diuretics on hold, cardiac medications deferred to  CHF team, nephrology following volume status i.e. IV fluids and diuretics.  CAD - Recent admission 5/22 for CGS in setting of acute STEMI requiring PCI + IABP support. Remains on  Plavix/statin-not on beta-blocker per cardiology-due to low flow state.Per Cards DC ASA 07/27/20.  Atrial flutter: Remains in atrial flutter this morning-rate controlled-digoxin levels supratherapeutic-amiodarone held on admission-remains anticoagulated with Eliquis.  Await further input from cardiology  HTN: BP proving after hydration with IV fluids continue to monitor.  Apply TED stockings for now.  Acute gout attack.  Stable uric acid levels, improved after IV steroids which will be continued for another day.  DM-2 (A1C 7.7 on 5/21): CBG stable-continue SSI-oral hypoglycemics remain on hold  Diet: Diet Order             Diet renal/carb modified with fluid restriction Diet-HS Snack? Nothing; Fluid restriction: 1200 mL Fluid; Room service appropriate? Yes; Fluid consistency: Thin  Diet effective now                    Code Status: Full code  Family Communication: None at bedside   Disposition Plan: Status is: Inpatient  Remains inpatient appropriate because:Inpatient level of care appropriate due to severity of illness  Dispo: The patient is from: Home              Anticipated d/c is to: Home              Patient currently is not medically stable to d/c.   Difficult to place patient No     Barriers to Discharge:  Improving renal function-noted at baseline-digoxin toxicity-requiring DigiFab.  Antimicrobial agents: Anti-infectives (From admission, onward)    None        Time spent: 45 minutes-Greater than 50% of this time was spent in counseling, explanation of diagnosis, planning of further management, and coordination of care.  MEDICATIONS: Scheduled Meds:  apixaban  5 mg Oral BID   aspirin EC  81 mg Oral Daily   atorvastatin  80 mg Oral Daily   bisacodyl  10 mg Rectal Once    clopidogrel  75 mg Oral Daily   docusate sodium  100 mg Oral BID   insulin aspart  0-9 Units Subcutaneous TID WC   pantoprazole  40 mg Oral Q1200   polyethylene glycol  17 g Oral BID   sodium chloride flush  3 mL Intravenous Q12H   spironolactone  12.5 mg Oral Daily   torsemide  20 mg Oral Daily   Continuous Infusions:   PRN Meds:.acetaminophen, albuterol, fentaNYL (SUBLIMAZE) injection, hydrALAZINE, ondansetron (ZOFRAN) IV, polyvinyl alcohol, traMADol   PHYSICAL EXAM: Vital signs: Vitals:   07/21/20 2321 07/22/20 0321 07/22/20 0514 07/22/20 0700  BP: 125/76 126/73  124/68  Pulse: 61 (!) 56  (!) 55  Resp: 18 20  18   Temp: 97.6 F (36.4 C) 98.2 F (36.8 C)  98.2 F (36.8 C)  TempSrc: Oral Oral  Oral  SpO2: 100% 100%  100%  Weight:   80 kg   Height:       Filed Weights   07/20/20 0500 07/21/20 0433 07/22/20 0514  Weight: 77.1 kg 77.1 kg 80 kg   Body mass index is 27.62 kg/m.   Gen Exam:  Awake Alert, No new F.N deficits, Normal affect [place].AT,PERRAL Supple Neck,No JVD, No cervical lymphadenopathy appriciated.  Symmetrical Chest wall movement, Good air movement bilaterally, CTAB RRR,No Gallops, Rubs or new Murmurs, No Parasternal Heave +ve B.Sounds, Abd Soft, No tenderness, No organomegaly appriciated, No rebound - guarding or rigidity. No Cyanosis,  right big toe tenderness is better    I have personally reviewed following labs and imaging studies  LABORATORY DATA: CBC: Recent Labs  Lab 07/17/20 1238 07/20/20 0109 07/21/20 0706 07/22/20 0109  WBC 9.9 4.8 7.0 9.5  NEUTROABS 8.8* 3.4 6.4 9.0*  HGB 15.7 10.1* 10.4* 10.5*  HCT 47.8 31.8* 31.1* 32.0*  MCV 85.7 88.3 86.6 87.4  PLT 303 109* 103* 113*    Basic Metabolic Panel: Recent Labs  Lab 07/18/20 0034 07/19/20 0208 07/20/20 0109 07/21/20 0706 07/22/20 0109  NA 132* 136 138 134* 134*  K 4.3 4.3 4.2 4.2 4.6  CL 99 106 106 104 103  CO2 22 22 25 23 24   GLUCOSE 149* 130* 117* 172* 215*  BUN 137*  98* 57* 31* 31*  CREATININE 4.54* 3.14* 2.20* 1.62* 1.86*  CALCIUM 8.8* 7.9* 8.0* 8.4* 8.4*  MG  --  2.1 2.0 2.2 2.2    GFR: Estimated Creatinine Clearance: 36.4 mL/min (A) (by C-G formula based on SCr of 1.86 mg/dL (H)).  Liver Function Tests: Recent Labs  Lab 07/20/20 0109 07/21/20 0706 07/22/20 0109  AST 31 29 28   ALT 19 19 23   ALKPHOS 63 66 66  BILITOT 0.7 0.7 0.8  PROT 5.4* 5.6* 5.7*  ALBUMIN 2.6* 2.6* 2.7*   No results for input(s): LIPASE, AMYLASE in the last 168 hours. No results for input(s): AMMONIA in the last 168 hours.  Coagulation Profile: No results for input(s): INR, PROTIME in the last 168 hours.  Cardiac Enzymes: No results  for input(s): CKTOTAL, CKMB, CKMBINDEX, TROPONINI in the last 168 hours.  BNP (last 3 results) No results for input(s): PROBNP in the last 8760 hours.  Lipid Profile: No results for input(s): CHOL, HDL, LDLCALC, TRIG, CHOLHDL, LDLDIRECT in the last 72 hours.  Thyroid Function Tests: Recent Labs    07/20/20 0804  TSH 0.964    Anemia Panel: No results for input(s): VITAMINB12, FOLATE, FERRITIN, TIBC, IRON, RETICCTPCT in the last 72 hours.  Urine analysis: No results found for: COLORURINE, APPEARANCEUR, LABSPEC, PHURINE, GLUCOSEU, HGBUR, BILIRUBINUR, KETONESUR, PROTEINUR, UROBILINOGEN, NITRITE, LEUKOCYTESUR  Sepsis Labs: Lactic Acid, Venous    Component Value Date/Time   LATICACIDVEN 1.3 06/20/2020 1433    MICROBIOLOGY: Recent Results (from the past 240 hour(s))  Resp Panel by RT-PCR (Flu A&B, Covid) Nasopharyngeal Swab     Status: None   Collection Time: 07/17/20  6:29 PM   Specimen: Nasopharyngeal Swab; Nasopharyngeal(NP) swabs in vial transport medium  Result Value Ref Range Status   SARS Coronavirus 2 by RT PCR NEGATIVE NEGATIVE Final    Comment: (NOTE) SARS-CoV-2 target nucleic acids are NOT DETECTED.  The SARS-CoV-2 RNA is generally detectable in upper respiratory specimens during the acute phase of  infection. The lowest concentration of SARS-CoV-2 viral copies this assay can detect is 138 copies/mL. A negative result does not preclude SARS-Cov-2 infection and should not be used as the sole basis for treatment or other patient management decisions. A negative result may occur with  improper specimen collection/handling, submission of specimen other than nasopharyngeal swab, presence of viral mutation(s) within the areas targeted by this assay, and inadequate number of viral copies(<138 copies/mL). A negative result must be combined with clinical observations, patient history, and epidemiological information. The expected result is Negative.  Fact Sheet for Patients:  EntrepreneurPulse.com.au  Fact Sheet for Healthcare Providers:  IncredibleEmployment.be  This test is no t yet approved or cleared by the [place] FDA and  has been authorized for detection and/or diagnosis of SARS-CoV-2 by FDA under an Emergency Use Authorization (EUA). This EUA will remain  in effect (meaning this test can be used) for the duration of the COVID-19 declaration under Section 564(b)(1) of the Act, 21 U.S.C.section 360bbb-3(b)(1), unless the authorization is terminated  or revoked sooner.       Influenza A by PCR NEGATIVE NEGATIVE Final   Influenza B by PCR NEGATIVE NEGATIVE Final    Comment: (NOTE) The Xpert Xpress SARS-CoV-2/FLU/RSV plus assay is intended as an aid in the diagnosis of influenza from Nasopharyngeal swab specimens and should not be used as a sole basis for treatment. Nasal washings and aspirates are unacceptable for Xpert Xpress SARS-CoV-2/FLU/RSV testing.  Fact Sheet for Patients: EntrepreneurPulse.com.au  Fact Sheet for Healthcare Providers: IncredibleEmployment.be  This test is not yet approved or cleared by the [place] FDA and has been authorized for detection and/or diagnosis of SARS-CoV-2  by FDA under an Emergency Use Authorization (EUA). This EUA will remain in effect (meaning this test can be used) for the duration of the COVID-19 declaration under Section 564(b)(1) of the Act, 21 U.S.C. section 360bbb-3(b)(1), unless the authorization is terminated or revoked.  Performed at [facility], [place] [address] [place], [place] [address]     RADIOLOGY STUDIES/RESULTS: No results found.   LOS: 5 days   Signature  Lala Lund M.D on 07/22/2020 at 10:44 AM   -  To page go to [email or web address]

## 2020-07-23 LAB — DIGOXIN LEVEL: Digoxin Level: 0.8 ng/mL (ref 0.8–2.0)

## 2020-07-23 LAB — COMPREHENSIVE METABOLIC PANEL
ALT: 25 U/L (ref 0–44)
AST: 24 U/L (ref 15–41)
Albumin: 2.6 g/dL — ABNORMAL LOW (ref 3.5–5.0)
Alkaline Phosphatase: 69 U/L (ref 38–126)
Anion gap: 7 (ref 5–15)
BUN: 33 mg/dL — ABNORMAL HIGH (ref 8–23)
CO2: 27 mmol/L (ref 22–32)
Calcium: 8.4 mg/dL — ABNORMAL LOW (ref 8.9–10.3)
Chloride: 101 mmol/L (ref 98–111)
Creatinine, Ser: 1.8 mg/dL — ABNORMAL HIGH (ref 0.61–1.24)
GFR, Estimated: 39 mL/min — ABNORMAL LOW (ref >60)
Glucose, Bld: 128 mg/dL — ABNORMAL HIGH (ref 70–99)
Potassium: 3.8 mmol/L (ref 3.5–5.1)
Sodium: 135 mmol/L (ref 135–145)
Total Bilirubin: 0.7 mg/dL (ref 0.3–1.2)
Total Protein: 5.4 g/dL — ABNORMAL LOW (ref 6.5–8.1)

## 2020-07-23 LAB — CBC WITH DIFFERENTIAL/PLATELET
Abs Immature Granulocytes: 0.03 10*3/uL (ref 0.00–0.07)
Basophils Absolute: 0 10*3/uL (ref 0.0–0.1)
Basophils Relative: 0 %
Eosinophils Absolute: 0 10*3/uL (ref 0.0–0.5)
Eosinophils Relative: 0 %
HCT: 30 % — ABNORMAL LOW (ref 39.0–52.0)
Hemoglobin: 9.9 g/dL — ABNORMAL LOW (ref 13.0–17.0)
Immature Granulocytes: 0 %
Lymphocytes Relative: 5 %
Lymphs Abs: 0.5 10*3/uL — ABNORMAL LOW (ref 0.7–4.0)
MCH: 28.4 pg (ref 26.0–34.0)
MCHC: 33 g/dL (ref 30.0–36.0)
MCV: 86 fL (ref 80.0–100.0)
Monocytes Absolute: 0.4 10*3/uL (ref 0.1–1.0)
Monocytes Relative: 4 %
Neutro Abs: 8.4 10*3/uL — ABNORMAL HIGH (ref 1.7–7.7)
Neutrophils Relative %: 91 %
Platelets: 101 10*3/uL — ABNORMAL LOW (ref 150–400)
RBC: 3.49 MIL/uL — ABNORMAL LOW (ref 4.22–5.81)
RDW: 18.9 % — ABNORMAL HIGH (ref 11.5–15.5)
WBC: 9.3 10*3/uL (ref 4.0–10.5)
nRBC: 0 % (ref 0.0–0.2)

## 2020-07-23 LAB — BRAIN NATRIURETIC PEPTIDE: B Natriuretic Peptide: 1879.9 pg/mL — ABNORMAL HIGH (ref 0.0–100.0)

## 2020-07-23 LAB — MAGNESIUM: Magnesium: 1.8 mg/dL (ref 1.7–2.4)

## 2020-07-23 LAB — GLUCOSE, CAPILLARY
Glucose-Capillary: 87 mg/dL (ref 70–99)
Glucose-Capillary: 141 mg/dL — ABNORMAL HIGH (ref 70–99)

## 2020-07-23 MED ORDER — DOCUSATE SODIUM 100 MG PO CAPS: 100.0000 mg | ORAL_CAPSULE | Freq: Two times a day (BID) | ORAL | 0 refills | Status: DC

## 2020-07-23 MED ORDER — METHYLPREDNISOLONE SODIUM SUCC 40 MG IJ SOLR
40.0000 mg | Freq: Once | INTRAMUSCULAR | Status: AC
  Administered 2020-07-23: 40 mg via INTRAVENOUS
  Filled 2020-07-23: qty 1

## 2020-07-23 MED ORDER — TORSEMIDE 20 MG PO TABS: 20.0000 mg | ORAL_TABLET | Freq: Every day | ORAL | Status: DC

## 2020-07-23 MED ORDER — PANTOPRAZOLE SODIUM 40 MG PO TBEC
40.0000 mg | DELAYED_RELEASE_TABLET | Freq: Every day | ORAL | Status: AC
Start: 2020-07-23 — End: ?

## 2020-07-23 NOTE — Discharge Summary (Signed)
Douglas Villa EPP:[id number] DOB: [birth year] DOA: 07/17/2020  PCP: Douglas Villa, Douglas Villa  Admit date: 07/17/2020  Discharge date: 07/23/2020  Admitted From: Home  Disposition:  SNF   Recommendations for Outpatient Follow-up:   Follow up with PCP in 1-2 weeks  PCP Please obtain BMP/CBC, 2 view CXR in 1week,  (see Discharge instructions)   PCP Please follow up on the following pending results: Monitor blood pressure, BMP, magnesium levels, diuretic dosages closely.  Needs close outpatient follow-up with his cardiologist within 1 to 2 weeks.  Stop aspirin on 07/27/2020.   Home Health: None Equipment/Devices: None  Consultations: Cards, Renal Discharge Condition: Stable Fair   CODE STATUS: Full    Diet Recommendation - Heart Healthy low carbohydrate diet with 1.5 L/day total fluid restriction.  Check CBGs q. Douglas Villa.  Diet Order             Diet renal/carb modified with fluid restriction Diet-HS Snack? Nothing; Fluid restriction: 1200 mL Fluid; Room service appropriate? Yes; Fluid consistency: Thin  Diet effective now                    Chief Complaint  Patient presents with   Weakness     Brief history of present illness from the day of admission and additional interim summary    Patient is a 70 y.o. male with history of recent STEMI-s/p PCI of distal LAD on 5/20, HFrEF, atrial flutter, CKD stage IV-who was seen at CHF clinic yesterday-and was found to be profoundly weak, orthostatic-he was subsequently referred to the ED where he was found to have acute kidney injury.   Significant events: 6/16>> admit for AKI 6/17>> digoxin level 3.2   Significant studies: 6/16>> CXR: No pneumonia   Antimicrobial therapy: None   Microbiology data: 6/16>> COVID/influenza PCR: Negative   Procedures  : None   Consults: Cardiology, nephrology                                                                 Hospital Course    AKI on CKD stage IV: AKI hemodynamically mediated-improving with IV fluids, renal ultrasound nonacute, baseline creatinine around 2, renal function improving, now better than baseline.  Monitor closely at SNF.   Hyperkalemia: Resolved.   Digoxin toxicity: Nausea is better, received DigiFab on 07/18/2020, digoxin level coming down and now near normal levels on 10/20/2020, evaluated by CHF team who are following, heart rate blood pressure gradually improving, per cardiology stable to be discharged without amiodarone and digoxin.  Heart rate in mid 50s with stable blood pressure.  Again he needs close follow-up with his cardiologist within 1 to 2 days of discharge   Chronic systolic heart failure EF 20 to 25%: Initially was dehydrated seen by cardiology, diuretics adjusted by cardiology team,  in the past he has refused LifeVest, will be discharged on present diuretic medications with close outpatient CHF team follow up post DC.   CAD - Recent admission 5/22 for CGS in setting of acute STEMI requiring PCI + IABP support. Remains on  Plavix/statin-not on beta-blocker per cardiology-due to low flow state.Per Cards DC ASA 07/27/20.   Atrial flutter: Remains in atrial flutter this morning-rate controlled-digoxin levels supratherapeutic-amiodarone held on admission-remains anticoagulated with Eliquis.  Follow-up with cardiologist Dr. Aundra Villa within a week of discharge.   HTN: BP has now stabilized blood pressure medications and CHF medications adjusted by cardiologist.  Monitor closely at SNF.   Acute gout attack.  Stable uric acid levels, improved after 3 doses of IV steroid almost resolved.   DM-2 (A1C 7.7 on 5/21): CBG stable-continue home regimen continue checking CBGs QA CHS at SNF.  Lab Results  Component Value Date   HGBA1C 7.7 (H) 06/21/2020   CBG (last 3)  Recent  Labs    07/22/20 1647 07/22/20 2035 07/23/20 0746  GLUCAP 219* 294* 87      Discharge diagnosis     Active Problems:   AKI (acute kidney injury) [facility])    Discharge instructions    Discharge Instructions     Discharge instructions   Complete by: As directed    Follow with Primary Douglas Villa Douglas Villa, Douglas Villa in 7 days   Get CBC, CMP, 2 view Chest X ray -  checked next visit within 1 week by Primary Douglas Villa or SNF Douglas Villa    Activity: As tolerated with Full fall precautions use walker/cane & assistance as needed  Disposition SNF  Diet: Heart Healthy low carbohydrate diet with 1.5 L/day total fluid restriction.  Check CBGs q. [place].  Check your Weight same time everyday, if you gain over 2 pounds, or you develop in leg swelling, experience more shortness of breath or chest pain, call your Primary Douglas Villa immediately. Follow Cardiac Low Salt Diet and 1.5 lit/day fluid restriction.  Special Instructions: If you have smoked or chewed Tobacco  in the last 2 yrs please stop smoking, stop any regular Alcohol  and or any Recreational drug use.  On your next visit with your primary care physician please Get Medicines reviewed and adjusted.  Please request your Prim.Douglas Villa to go over all Hospital Tests and Procedure/Radiological results at the follow up, please get all Hospital records sent to your Prim Douglas Villa by signing hospital release before you go home.  If you experience worsening of your admission symptoms, develop shortness of breath, life threatening emergency, suicidal or homicidal thoughts you must seek medical attention immediately by calling 911 or calling your Douglas Villa immediately  if symptoms less severe.  You Must read complete instructions/literature along with all the possible adverse reactions/side effects for all the Medicines you take and that have been prescribed to you. Take any new Medicines after you have completely understood and accpet all the possible adverse reactions/side effects.    Increase activity slowly   Complete by: As directed        Discharge Medications   Allergies as of 07/23/2020       Reactions   Penicillins Other (See Comments)   Sweating        Medication List     STOP taking these medications    amiodarone 200 MG tablet Commonly known as: PACERONE   digoxin 0.125 MG tablet Commonly known as: LANOXIN       TAKE these medications  Accu-Chek Aviva Plus w/Device Kit by Does not apply route.   Accu-Chek FastClix Lancets Misc by Does not apply route.   albuterol 108 (90 Base) MCG/ACT inhaler Commonly known as: VENTOLIN HFA Inhale 2 puffs into the lungs every 6 (six) hours as needed for wheezing or shortness of breath.   apixaban 5 MG Tabs tablet Commonly known as: ELIQUIS Take 1 tablet (5 mg total) by mouth 2 (two) times daily.   aspirin 81 MG tablet Take 1 tablet (81 mg total) by mouth daily.   atorvastatin 80 MG tablet Commonly known as: LIPITOR Take 1 tablet (80 mg total) by mouth daily. Needs appt for future refills   clopidogrel 75 MG tablet Commonly known as: PLAVIX TAKE 1 TABLET BY MOUTH ONCE DAILY . APPOINTMENT REQUIRED FOR FUTURE REFILLS What changed: See the new instructions.   dapagliflozin propanediol 10 MG Tabs tablet Commonly known as: FARXIGA Take 1 tablet (10 mg total) by mouth daily.   docusate sodium 100 MG capsule Commonly known as: COLACE Take 1 capsule (100 mg total) by mouth 2 (two) times daily.   glucose blood test strip 1 each by Other route as needed. Use as instructed   hydrALAZINE 25 MG tablet Commonly known as: APRESOLINE TAKE 1 & 1/2 (ONE & ONE-HALF) TABLETS BY MOUTH THREE TIMES DAILY. NEED AN APPOINTMENT FOR FUTURE REFILLS What changed: See the new instructions.   isosorbide mononitrate 30 MG 24 hr tablet Commonly known as: IMDUR Take 1 tablet (30 mg total) by mouth daily. Needs appt for future refills   nitroGLYCERIN 0.4 MG SL tablet Commonly known as: NITROSTAT Place 0.4 mg  under the tongue every 5 (five) minutes as needed for chest pain.   pantoprazole 40 MG tablet Commonly known as: PROTONIX Take 1 tablet (40 mg total) by mouth daily at 12 noon.   potassium chloride SA 20 MEQ tablet Commonly known as: KLOR-CON Take 1 tablet (20 mEq total) by mouth daily.   spironolactone 25 MG tablet Commonly known as: ALDACTONE Take 1 tablet by mouth once daily   torsemide 20 MG tablet Commonly known as: DEMADEX Take 1 tablet (20 mg total) by mouth daily. Start taking on: July 24, 2020 What changed:  medication strength how much to take   VISINE DRY EYE RELIEF OP Place 1 drop into both eyes daily as needed (For dry eyes).         Follow-up Information     [facility] Follow up.   Specialty: Cardiology Why: August 05, 2020 at 3:30 PM at the [facility] at [facility], [place] [phone] Contact information: [address] [phone] [place]        Douglas Villa, Douglas Villa. Schedule an appointment as soon as possible for a visit in 1 week(s).   Specialty: Family Medicine Contact information: [address] [place] [address] [phone]         Larey Dresser, Douglas Villa. Schedule an appointment as soon as possible for a visit in 1 week(s).   Specialty: Cardiology Contact information: [place] [address] [phone]                 Major procedures and Radiology Reports - PLEASE review detailed and final reports thoroughly  -       US RENAL  Result Date: 07/17/2020 CLINICAL DATA:  Acute kidney injury EXAM: RENAL /  URINARY TRACT ULTRASOUND COMPLETE COMPARISON:  None. FINDINGS: Right Kidney: Renal measurements: 7.9 x 4.3 x 5.1 cm = volume: 88.6 mL. Echogenicity within normal limits. No hydronephrosis. Cyst in the upper pole measuring 2.8 x 2.5 x 2.2 cm. Cyst in the lower  pole measuring 1.2 x 1.5 x 1 cm. Left Kidney: Renal measurements: 6.9 x 5.9 x 4.2 cm = volume: 88 mL. Suspect length measurement under estimated due to presence of lower pole cyst. Echogenicity within normal limits. No hydronephrosis. Cyst at the lower pole measuring 4.3 x 4.3 x 4.8 cm. Bladder: Appears normal for degree of bladder distention. Other: None. IMPRESSION: 1. Negative for hydronephrosis. 2. Simple appearing bilateral renal cysts Electronically Signed   By: Donavan Foil M.D.   On: 07/17/2020 15:52   DG Chest Port 1 View  Result Date: 07/17/2020 CLINICAL DATA:  Weakness EXAM: PORTABLE CHEST 1 VIEW COMPARISON:  Jun 21, 2020 FINDINGS: The lungs are clear. Heart is mildly enlarged with pulmonary vascularity normal. No adenopathy. No pneumothorax. No bone lesions. There is aortic atherosclerosis. IMPRESSION: Mild cardiomegaly. Lungs clear. Aortic Atherosclerosis (ICD10-I70.0). Electronically Signed   By: Lowella Grip III M.D.   On: 07/17/2020 11:53   ECHO TEE  Result Date: 07/17/2020    TRANSESOPHOGEAL ECHO REPORT   Patient Name:   Douglas Villa Date of Exam: 06/27/2020 Medical Rec #:  [id number]         Height:       67.0 in Accession #:    [id number]        Weight:       172.0 lb Date of Birth:  [birth year]          BSA:          1.896 m Patient Age:    1 years          BP:           104/86 mmHg Patient Gender: M                 HR:           77 bpm. Exam Location:  Inpatient Procedure: 3D Echo, Transesophageal Echo, Cardiac Doppler and Color Doppler Indications:     Atrial fibrillation and Flutter  History:         Patient has prior history of Echocardiogram examinations, most                  recent 06/21/2020. CHF, Previous Myocardial Infarction and CAD,                  Abnormal ECG, COPD, Arrythmias:Atrial Flutter and Atrial                  Fibrillation; Risk Factors:Hypertension, Dyslipidemia and                  Diabetes.  Sonographer:     Roseanna Rainbow RDCS Referring Phys:  [id number] AMY D  CLEGG Diagnosing Phys: Loralie Champagne Douglas Villa PROCEDURE: After discussion of the risks and benefits of a TEE, an informed consent was obtained from the patient. The transesophogeal probe was passed without difficulty through the esophogus of the patient. Imaged were obtained with the patient in a left lateral decubitus position. Sedation performed by different physician. The patient was monitored while under deep sedation. Anesthestetic sedation was provided intravenously by Anesthesiology: 222mg  of Propofol, 80mg  of Lidocaine. The patient's vital signs; including heart rate, blood pressure, and oxygen saturation; remained stable throughout the  procedure. The patient developed no complications during the procedure. A successful direct current cardioversion was performed at 150 joules with 1 attempt. IMPRESSIONS  1. Left ventricular ejection fraction, by estimation, is 20 to 25%. The left ventricle has severely decreased function. The left ventricle demonstrates global hypokinesis. The left ventricular internal cavity size was mildly dilated. There is mild left ventricular hypertrophy.  2. Right ventricular systolic function is moderately reduced. The right ventricular size is mildly enlarged.  3. Left atrial size was mild to moderately dilated. No left atrial/left atrial appendage thrombus was detected.  4. Right atrial size was moderately dilated.  5. Moderate mitral regurgitation with restriction of the posterior leaflet. No evidence of mitral stenosis.  6. No PFO/ASD by color doppler.  7. The aortic valve is tricuspid. Aortic valve regurgitation is trivial. No aortic stenosis is present.  8. Normal caliber thoracic aorta with grade 3 plaque. FINDINGS  Left Ventricle: Left ventricular ejection fraction, by estimation, is 20 to 25%. The left ventricle has severely decreased function. The left ventricle demonstrates global hypokinesis. The left ventricular internal cavity size was mildly dilated. There is mild left  ventricular hypertrophy. Right Ventricle: The right ventricular size is mildly enlarged. No increase in right ventricular wall thickness. Right ventricular systolic function is moderately reduced. Left Atrium: Left atrial size was mild to moderately dilated. No left atrial/left atrial appendage thrombus was detected. Right Atrium: Right atrial size was moderately dilated. Pericardium: There is no evidence of pericardial effusion. Mitral Valve: Moderate mitral regurgitation with restriction of the posterior leaflet. The mitral valve is abnormal. Moderate mitral valve regurgitation. No evidence of mitral valve stenosis. Tricuspid Valve: The tricuspid valve is normal in structure. Tricuspid valve regurgitation is trivial. Aortic Valve: The aortic valve is tricuspid. Aortic valve regurgitation is trivial. No aortic stenosis is present. Pulmonic Valve: The pulmonic valve was normal in structure. Pulmonic valve regurgitation is not visualized. Aorta: Normal caliber thoracic aorta with grade 3 plaque. The aortic root is normal in size and structure. IAS/Shunts: No PFO/ASD by color doppler.  MR PISA:        4.02 cm MR PISA Radius: 0.80 cm Loralie Champagne Douglas Villa Electronically signed by Loralie Champagne Douglas Villa Signature Date/Time: 07/17/2020/10:42:22 PM    Final     Micro Results    Recent Results (from the past 240 hour(s))  Resp Panel by RT-PCR (Flu A&B, Covid) Nasopharyngeal Swab     Status: None   Collection Time: 07/17/20  6:29 PM   Specimen: Nasopharyngeal Swab; Nasopharyngeal(NP) swabs in vial transport medium  Result Value Ref Range Status   SARS Coronavirus 2 by RT PCR NEGATIVE NEGATIVE Final    Comment: (NOTE) SARS-CoV-2 target nucleic acids are NOT DETECTED.  The SARS-CoV-2 RNA is generally detectable in upper respiratory specimens during the acute phase of infection. The lowest concentration of SARS-CoV-2 viral copies this assay can detect is 138 copies/mL. A negative result does not preclude  SARS-Cov-2 infection and should not be used as the sole basis for treatment or other patient management decisions. A negative result may occur with  improper specimen collection/handling, submission of specimen other than nasopharyngeal swab, presence of viral mutation(s) within the areas targeted by this assay, and inadequate number of viral copies(<138 copies/mL). A negative result must be combined with clinical observations, patient history, and epidemiological information. The expected result is Negative.  Fact Sheet for Patients:  EntrepreneurPulse.com.au  Fact Sheet for Healthcare Providers:  IncredibleEmployment.be  This test is no t yet approved or cleared  by the [place] and  has been authorized for detection and/or diagnosis of SARS-CoV-2 by FDA under an Emergency Use Authorization (EUA). This EUA will remain  in effect (meaning this test can be used) for the duration of the COVID-19 declaration under Section 564(b)(1) of the Act, 21 U.S.C.section 360bbb-3(b)(1), unless the authorization is terminated  or revoked sooner.       Influenza A by PCR NEGATIVE NEGATIVE Final   Influenza B by PCR NEGATIVE NEGATIVE Final    Comment: (NOTE) The Xpert Xpress SARS-CoV-2/FLU/RSV plus assay is intended as an aid in the diagnosis of influenza from Nasopharyngeal swab specimens and should not be used as a sole basis for treatment. Nasal washings and aspirates are unacceptable for Xpert Xpress SARS-CoV-2/FLU/RSV testing.  Fact Sheet for Patients: EntrepreneurPulse.com.au  Fact Sheet for Healthcare Providers: IncredibleEmployment.be  This test is not yet approved or cleared by the [place] FDA and has been authorized for detection and/or diagnosis of SARS-CoV-2 by FDA under an Emergency Use Authorization (EUA). This EUA will remain in effect (meaning this test can be used) for the duration of  the COVID-19 declaration under Section 564(b)(1) of the Act, 21 U.S.C. section 360bbb-3(b)(1), unless the authorization is terminated or revoked.  Performed at [facility], [place] [address] [place], [place] [address]   SARS CORONAVIRUS 2 (TAT 6-24 HRS) Nasopharyngeal Nasopharyngeal Swab     Status: None   Collection Time: 07/22/20  2:57 PM   Specimen: Nasopharyngeal Swab  Result Value Ref Range Status   SARS Coronavirus 2 NEGATIVE NEGATIVE Final    Comment: (NOTE) SARS-CoV-2 target nucleic acids are NOT DETECTED.  The SARS-CoV-2 RNA is generally detectable in upper and lower respiratory specimens during the acute phase of infection. Negative results do not preclude SARS-CoV-2 infection, do not rule out co-infections with other pathogens, and should not be used as the sole basis for treatment or other patient management decisions. Negative results must be combined with clinical observations, patient history, and epidemiological information. The expected result is Negative.  Fact Sheet for Patients: SugarRoll.be  Fact Sheet for Healthcare Providers: https://www.woods-mathews.com/  This test is not yet approved or cleared by the [place] FDA and  has been authorized for detection and/or diagnosis of SARS-CoV-2 by FDA under an Emergency Use Authorization (EUA). This EUA will remain  in effect (meaning this test can be used) for the duration of the COVID-19 declaration under Se ction 564(b)(1) of the Act, 21 U.S.C. section 360bbb-3(b)(1), unless the authorization is terminated or revoked sooner.  Performed at [facility], [place] [address] [place], [place] [address]     Today   Subjective    Douglas Villa today has no headache,no chest abdominal pain,no new weakness tingling or numbness, feels much better wants to go home today.   Objective   Blood pressure 118/67, pulse (!) 53, temperature 97.9 F (36.6 C),  temperature source Oral, resp. rate 18, height $RemoveBe'5\' 7"'polmXFaOA$  (1.702 m), weight 80 kg, SpO2 100 %.   Intake/Output Summary (Last 24 hours) at 07/23/2020 0933 Last data filed at 07/23/2020 0429 Gross per 24 hour  Intake 462 ml  Output 1300 ml  Net -838 ml    Exam  Awake Alert, No new F.N deficits, Normal affect [place].AT,PERRAL Supple Neck,No JVD, No cervical lymphadenopathy appriciated.  Symmetrical Chest wall movement, Good air movement bilaterally, CTAB RRR,No Gallops,Rubs or new Murmurs, No Parasternal Heave +ve B.Sounds, Abd Soft, Non tender, No organomegaly appriciated, No rebound -guarding  or rigidity. No Cyanosis, Clubbing or edema, No new Rash or bruise   Data Review   CBC w Diff:  Lab Results  Component Value Date   WBC 9.3 07/23/2020   HGB 9.9 (L) 07/23/2020   HCT 30.0 (L) 07/23/2020   PLT 101 (L) 07/23/2020   LYMPHOPCT 5 07/23/2020   MONOPCT 4 07/23/2020   EOSPCT 0 07/23/2020   BASOPCT 0 07/23/2020    CMP:  Lab Results  Component Value Date   NA 135 07/23/2020   NA 145 (H) 08/19/2017   K 3.8 07/23/2020   CL 101 07/23/2020   CO2 27 07/23/2020   BUN 33 (H) 07/23/2020   BUN 24 08/19/2017   CREATININE 1.80 (H) 07/23/2020   PROT 5.4 (L) 07/23/2020   PROT 6.8 08/19/2017   ALBUMIN 2.6 (L) 07/23/2020   ALBUMIN 4.1 08/19/2017   BILITOT 0.7 07/23/2020   BILITOT 1.6 (H) 08/19/2017   ALKPHOS 69 07/23/2020   AST 24 07/23/2020   ALT 25 07/23/2020  .   Total Time in preparing paper work, data evaluation and todays exam - 14 minutes  Lala Lund M.D on 07/23/2020 at 9:33 AM  [facility]

## 2020-07-23 NOTE — Discharge Instructions (Signed)
Follow with Primary MD Lillard Anes, MD in 7 days   Get CBC, CMP, 2 view Chest X ray -  checked next visit within 1 week by Primary MD or SNF MD    Activity: As tolerated with Full fall precautions use walker/cane & assistance as needed  Disposition SNF  Diet: Heart Healthy low carbohydrate diet with 1.5 L/day total fluid restriction.  Check CBGs q. [place].  Check your Weight same time everyday, if you gain over 2 pounds, or you develop in leg swelling, experience more shortness of breath or chest pain, call your Primary MD immediately. Follow Cardiac Low Salt Diet and 1.5 lit/day fluid restriction.  Special Instructions: If you have smoked or chewed Tobacco  in the last 2 yrs please stop smoking, stop any regular Alcohol  and or any Recreational drug use.  On your next visit with your primary care physician please Get Medicines reviewed and adjusted.  Please request your Prim.MD to go over all Hospital Tests and Procedure/Radiological results at the follow up, please get all Hospital records sent to your Prim MD by signing hospital release before you go home.  If you experience worsening of your admission symptoms, develop shortness of breath, life threatening emergency, suicidal or homicidal thoughts you must seek medical attention immediately by calling 911 or calling your MD immediately  if symptoms less severe.  You Must read complete instructions/literature along with all the possible adverse reactions/side effects for all the Medicines you take and that have been prescribed to you. Take any new Medicines after you have completely understood and accpet all the possible adverse reactions/side effects.

## 2020-07-23 NOTE — Progress Notes (Signed)
PT Cancellation Note  Patient Details Name: Douglas Villa MRN: [id number] DOB: 09-Aug-1950   Cancelled Treatment:    Reason Eval/Treat Not Completed: Other (comment) per chart, appears to be leaving for SNF today. Holding for now per dept protocol. Thank you for the opportunity to participate in his care!   Windell Norfolk, DPT, PN1   Supplemental Physical Therapist [facility]    Pager [phone] Acute Rehab Office [phone]

## 2020-07-23 NOTE — Care Management Important Message (Signed)
Important Message  Patient Details  Name: Douglas Villa MRN: [id number] Date of Birth: Apr 21, 1950   Medicare Important Message Given:  Yes - Important Message mailed due to current National Emergency   Verbal consent obtained due to current National Emergency  Relationship to patient: Self Contact Name: Fareed Call Date: 07/23/20  Time: 1327 Phone: [phone] Outcome: Spoke with contact Important Message mailed to: Patient address on file    Delorse Lek 07/23/2020, 1:27 PM

## 2020-07-23 NOTE — TOC Transition Note (Signed)
Transition of Care [facility]) - CM/SW Discharge Note Heart Failure   Patient Details  Name: Douglas Villa MRN: [id number] Date of Birth: 1950-02-10  Transition of Care [facility]) CM/SW Contact:  [place], [place] Phone Number: 07/23/2020, 10:13 AM   Clinical Narrative:    Patient will DC to: [place] date:07/23/2020 Transport by: Patient's ex-wife/significant other   Per [place] patient ready for DC to [facility]. RN to call report prior to discharge (RM#[phone]). RN, patient, and facility notified of DC. Discharge Summary and FL2 sent to facility. Patient's ex-wife to provide transportation.  CSW will sign off for now as social work intervention is no longer needed. Please consult [place] again if new needs arise.     Final next level of care: Skilled Nursing Facility Barriers to Discharge: No Barriers Identified   Patient Goals and CMS Choice        Discharge Placement PASRR number recieved: 07/23/20 Existing PASRR number confirmed : 07/23/20          Patient chooses bed at: [facility] Patient to be transferred to facility by: ex-wife/signigicant other      Discharge Plan and Services In-house Referral: Clinical Social Work Discharge Planning Services: CM Consult                                 Social Determinants of Health (SDOH) Interventions Food Insecurity Interventions: Assist with [employer] Application Financial Strain Interventions: Intervention Not Indicated Housing Interventions: Intervention Not Indicated Transportation Interventions: Intervention Not Indicated   Readmission Risk Interventions No flowsheet data found.   Makayela Secrest, MSW, [place] Heart Failure Social Worker

## 2020-07-23 NOTE — Progress Notes (Signed)
Occupational Therapy Treatment Patient Details Name: Douglas Villa MRN: [id number] DOB: [birth year] Today's Date: 07/23/2020    History of present illness 70 y/o male admitted secondary to dizziness, and weakness. Pt with bed bugs upon presentation as well. Found to have AKI and hyperkalemia. PMH includes CHF, CAD, HTN, COPD, OSA.   OT comments  Pt walking about his room initially with RW, progressed to no device and min guard assist. Completed standing grooming and pericare with supervision and UB dressing with set up. HR 58 throughout session.  Follow Up Recommendations  SNF    Equipment Recommendations  None recommended by OT    Recommendations for Other Services      Precautions / Restrictions Precautions Precautions: Fall       Mobility Bed Mobility Overal bed mobility: Modified Independent             General bed mobility comments: HOB up    Transfers Overall transfer level: Needs assistance Equipment used: Rolling walker (2 wheeled);None Transfers: Sit to/from Stand Sit to Stand: Supervision         General transfer comment: increased time, no physical assist    Balance Overall balance assessment: Mild deficits observed, not formally tested                                         ADL either performed or assessed with clinical judgement   ADL Overall ADL's : Needs assistance/impaired     Grooming: Supervision/safety;Standing;Wash/dry hands;Wash/dry face           Upper Body Dressing : Set up;Sitting       Toilet Transfer: Min guard;Ambulation   Toileting- Clothing Manipulation and Hygiene: Supervision/safety;Sit to/from stand       Functional mobility during ADLs: Min guard General ADL Comments: pt wanting to attempt ambulation without RW, requires min guard assist     Vision       Perception     Praxis      Cognition Arousal/Alertness: Awake/alert Behavior During Therapy: WFL for tasks  assessed/performed Overall Cognitive Status: Within Functional Limits for tasks assessed                                          Exercises     Shoulder Instructions       General Comments      Pertinent Vitals/ Pain       Pain Assessment: No/denies pain  Home Living                                          Prior Functioning/Environment              Frequency  Min 2X/week        Progress Toward Goals  OT Goals(current goals can now be found in the care plan section)  Progress towards OT goals: Progressing toward goals  Acute Rehab OT Goals Patient Stated Goal: to feel better OT Goal Formulation: With patient Time For Goal Achievement: 08/01/20 Potential to Achieve Goals: Good  Plan Discharge plan remains appropriate    Co-evaluation                 AM-PAC OT "6  Clicks" Daily Activity     Outcome Measure   Help from another person eating meals?: None Help from another person taking care of personal grooming?: A Little Help from another person toileting, which includes using toliet, bedpan, or urinal?: A Little Help from another person bathing (including washing, rinsing, drying)?: A Little Help from another person to put on and taking off regular upper body clothing?: None Help from another person to put on and taking off regular lower body clothing?: A Little 6 Click Score: 20    End of Session    OT Visit Diagnosis: Unsteadiness on feet (R26.81);Dizziness and giddiness (R42);Pain;Muscle weakness (generalized) (M62.81)   Activity Tolerance Patient tolerated treatment well   Patient Left in chair;with call bell/phone within reach;with chair alarm set   Nurse Communication          Time: [phone] OT Time Calculation (min): 31 min  Charges: OT General Charges $OT Visit: 1 Visit OT Treatments $Self Care/Home Management : 23-37 mins  Nestor Lewandowsky, OTR/L Acute Rehabilitation Services Pager:  [phone] Office: [phone]   Malka So 07/23/2020, 9:21 AM

## 2020-07-23 NOTE — Progress Notes (Signed)
Pt's ex wife is here to pick up pt and transport him to [place]. Pt's belongings taken with pt. Pt alert and oriented x4 in no acute distress upon discharge.

## 2020-07-23 NOTE — Progress Notes (Signed)
Report given to Regulatory affairs officer at [employer].

## 2020-07-24 NOTE — Chronic Care Management (AMB) (Signed)
  Chronic Care Management   Outreach Note  07/24/2020 Name: Douglas Villa MRN: [id number] DOB: 03-14-1950  Douglas Villa is a 70 y.o. year old male who is a primary care patient of Lillard Anes, [place]. I reached out to Douglas Villa by phone today in response to a referral sent by Douglas Villa PCP, [name], [place]     Third unsuccessful telephone outreach was attempted today. The patient was referred to the case management team for assistance with care management and care coordination. The patient's primary care provider has been notified of our unsuccessful attempts to make or maintain contact with the patient. The care management team is pleased to engage with this patient at any time in the future should he/she be interested in assistance from the care management team.   Follow Up Plan: We have been unable to make contact with the patient for follow up. The care management team is available to follow up with the patient after provider conversation with the patient regarding recommendation for care management engagement and subsequent re-referral to the care management team.   Julian Hy, [place] Management  Direct Dial: [phone]

## 2020-07-24 NOTE — Chronic Care Management (AMB) (Deleted)
  Chronic Care Management   Outreach Note  07/24/2020 Name: QUINNTIN MALTER MRN: [id number] DOB: 28-Jul-1950  OZIAS DICENZO is a 70 y.o. year old male who is a primary care patient of Lillard Anes, [place]. I reached out to Antony Madura by phone today in response to a referral sent by Mr. Wylene Simmer PCP, [name], [place]     Third unsuccessful telephone outreach was attempted today. The patient was referred to the case management team for assistance with care management and care coordination. The patient's primary care provider has been notified of our unsuccessful attempts to make or maintain contact with the patient. The care management team is pleased to engage with this patient at any time in the future should he/she be interested in assistance from the care management team.   Follow Up Plan: A HIPAA compliant phone message was left for the patient providing contact information and requesting a return call.  If patient returns call to provider office, please advise to call Embedded Care Management Care Guide Vear Staton at [place], [place] Management  Direct Dial: [phone]

## 2020-08-05 ENCOUNTER — Ambulatory Visit (HOSPITAL_COMMUNITY)
Admission: RE | Admit: 2020-08-05 | Discharge: 2020-08-05 | Disposition: A | Discharge status: Home / Self Care | Payer: Self-pay | Source: Ambulatory Visit | Attending: Family Medicine | Admitting: Family Medicine

## 2020-08-05 ENCOUNTER — Encounter (HOSPITAL_COMMUNITY): Payer: Self-pay

## 2020-08-05 ENCOUNTER — Other Ambulatory Visit: Payer: Self-pay

## 2020-08-05 VITALS — BP 112/68 | HR 66 | Wt 164.8 lb

## 2020-08-05 DIAGNOSIS — I130 Hypertensive heart and chronic kidney disease with heart failure and stage 1 through stage 4 chronic kidney disease, or unspecified chronic kidney disease: Secondary | ICD-10-CM | POA: Insufficient documentation

## 2020-08-05 DIAGNOSIS — I4892 Unspecified atrial flutter: Secondary | ICD-10-CM | POA: Insufficient documentation

## 2020-08-05 DIAGNOSIS — G4733 Obstructive sleep apnea (adult) (pediatric): Secondary | ICD-10-CM | POA: Insufficient documentation

## 2020-08-05 DIAGNOSIS — Z87891 Personal history of nicotine dependence: Secondary | ICD-10-CM | POA: Insufficient documentation

## 2020-08-05 DIAGNOSIS — E785 Hyperlipidemia, unspecified: Secondary | ICD-10-CM | POA: Insufficient documentation

## 2020-08-05 DIAGNOSIS — Z7902 Long term (current) use of antithrombotics/antiplatelets: Secondary | ICD-10-CM | POA: Insufficient documentation

## 2020-08-05 DIAGNOSIS — Z7984 Long term (current) use of oral hypoglycemic drugs: Secondary | ICD-10-CM | POA: Insufficient documentation

## 2020-08-05 DIAGNOSIS — I2510 Atherosclerotic heart disease of native coronary artery without angina pectoris: Secondary | ICD-10-CM | POA: Insufficient documentation

## 2020-08-05 DIAGNOSIS — Z7982 Long term (current) use of aspirin: Secondary | ICD-10-CM | POA: Insufficient documentation

## 2020-08-05 DIAGNOSIS — I5082 Biventricular heart failure: Secondary | ICD-10-CM | POA: Insufficient documentation

## 2020-08-05 DIAGNOSIS — I255 Ischemic cardiomyopathy: Secondary | ICD-10-CM | POA: Insufficient documentation

## 2020-08-05 DIAGNOSIS — Z79899 Other long term (current) drug therapy: Secondary | ICD-10-CM | POA: Insufficient documentation

## 2020-08-05 DIAGNOSIS — Z7901 Long term (current) use of anticoagulants: Secondary | ICD-10-CM | POA: Insufficient documentation

## 2020-08-05 DIAGNOSIS — I5022 Chronic systolic (congestive) heart failure: Secondary | ICD-10-CM | POA: Insufficient documentation

## 2020-08-05 DIAGNOSIS — I4891 Unspecified atrial fibrillation: Secondary | ICD-10-CM | POA: Insufficient documentation

## 2020-08-05 DIAGNOSIS — I252 Old myocardial infarction: Secondary | ICD-10-CM | POA: Insufficient documentation

## 2020-08-05 DIAGNOSIS — J449 Chronic obstructive pulmonary disease, unspecified: Secondary | ICD-10-CM | POA: Insufficient documentation

## 2020-08-05 DIAGNOSIS — N184 Chronic kidney disease, stage 4 (severe): Secondary | ICD-10-CM | POA: Insufficient documentation

## 2020-08-05 DIAGNOSIS — Z955 Presence of coronary angioplasty implant and graft: Secondary | ICD-10-CM | POA: Insufficient documentation

## 2020-08-05 DIAGNOSIS — I428 Other cardiomyopathies: Secondary | ICD-10-CM | POA: Insufficient documentation

## 2020-08-05 LAB — CBC
HCT: 39.7 % (ref 39.0–52.0)
Hemoglobin: 12.7 g/dL — ABNORMAL LOW (ref 13.0–17.0)
MCH: 28.4 pg (ref 26.0–34.0)
MCHC: 32 g/dL (ref 30.0–36.0)
MCV: 88.8 fL (ref 80.0–100.0)
Platelets: 181 10*3/uL (ref 150–400)
RBC: 4.47 MIL/uL (ref 4.22–5.81)
RDW: 20.5 % — ABNORMAL HIGH (ref 11.5–15.5)
WBC: 5.6 10*3/uL (ref 4.0–10.5)
nRBC: 0 % (ref 0.0–0.2)

## 2020-08-05 LAB — BASIC METABOLIC PANEL
Anion gap: 8 (ref 5–15)
BUN: 45 mg/dL — ABNORMAL HIGH (ref 8–23)
CO2: 25 mmol/L (ref 22–32)
Calcium: 8.5 mg/dL — ABNORMAL LOW (ref 8.9–10.3)
Chloride: 101 mmol/L (ref 98–111)
Creatinine, Ser: 2.6 mg/dL — ABNORMAL HIGH (ref 0.61–1.24)
GFR, Estimated: 25 mL/min — ABNORMAL LOW (ref >60)
Glucose, Bld: 149 mg/dL — ABNORMAL HIGH (ref 70–99)
Potassium: 4.4 mmol/L (ref 3.5–5.1)
Sodium: 134 mmol/L — ABNORMAL LOW (ref 135–145)

## 2020-08-05 MED ORDER — AMIODARONE HCL 200 MG PO TABS: 200.0000 mg | ORAL_TABLET | Freq: Two times a day (BID) | ORAL | 3 refills | Status: DC

## 2020-08-05 NOTE — Progress Notes (Signed)
ADVANCED HEART FAILURE CLINIC NOTE   Date:  08/05/2020   ID:  Douglas Villa, DOB 1950/09/08, MRN [id number]  Location: Home  Provider location: [place] [facility] Heart Failure Type of Visit: Established patient  PCP:  Douglas Anes, [place]  Cardiologist:  Dr. Aundra Villa  Chief Complaint: post-hospitalization CHF follow up  History of Present Illness:  Patient has a history of CAD, cardiomyopathy, HTN, and smoking (quit smoking in 2/19) with COPD.  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  He had had one prior episode of CHF in 2016 where he was admitted at [facility] (no records available).  At the hospital in [place], he had LHC showed occluded PLV and 80% stenosis proximal LCx.  EF was 25-35% by LV-gram.  He had DES placed to pLCx.     Echo was done in 6/19.  This showed EF 25-30% with moderate-severe MR, moderate-severe TR. He saw Dr. Caryl Villa for ICD consideration, given frequent PVCs a holter was placed in 9/19 => 2% PVCs.  CPX was done in 9/19 showing moderate functional impairment from heart failure. Repeat echo in 1/20 showed EF 20-25% with severe LVH, mild MR, normal RV.    He continues to smoke 1 ppd.  He gets short of breath with heavy lifting/carrying as well as doing yardwork on a hot day, dyspnea mildly worse recently.  No dyspnea walking on flat ground.  No chest pain.  No claudication.  He has left 1st MTP joint pain, the joint is warm as well. No edema.  Weight is up about 6 lbs.  Mr Douglas Villa is a 70 year old with a history of CAD, chronic systolic heart failure, HTN, COPD, OSA, and smoking (quit smoking in 2/19) with COPD.     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  He had had one prior episode of CHF in 2016 where he was admitted at [facility] (no records available).  At the hospital in [place], he had LHC showed  occluded PLV and 80% stenosis proximal LCx.  EF was 25-35% by LV-gram.  He had DES placed to pLCx.     Echo was done in 6/19.  This showed EF 25-30% with moderate-severe MR, moderate-severe TR. He saw Dr. Caryl Villa for ICD consideration, given frequent PVCs a holter was placed in 9/19 => 2% PVCs.  CPX was done in 9/19 showing moderate functional impairment from heart failure. Repeat echo in 1/20 showed EF 20-25% with severe LVH, mild MR, normal RV.    He had been followed by Dr Douglas Villa and had a virtual visit back to 2020. He has not been seen since that time because he was concerned about COVID. He has been of meds for some time.  He was sent to the ED from PCP office 5/22 for volume overload. Echo showed an EF at 30%. Started on IV Lasix. EKG showed anterior lead STE and he was transferred to [facility] for emergent LHC and shock team evaluation.   LHC 06/20/2020 showed distally occluded LAD with PCI placement. IABP was placed due to cardiogenic shock. Swan numbers suggestive of severe biventricular failure/shock Echo with LVEF 15% RV severely decreased with severe TR and moderate to severe MR. No VSD. Placed on milrinone, NE, IV Lasix. He was noted to be in new onset atrial fibrillation. TEE/DCCV performed 06/27/20 to NSR  with no complication.   Given anterior MI and LVEF <35%, Dr. Aundra Villa recommended pt be discharged home w/ LifeVest for primary prevention, however pt declined, citing he wore a LifeVest several years ago when he lived in [place] and could not tolerate wearing it. He was informed of indication and risk of being discharged w/o protection. He understood the risk and still declined LifeVest. He was discharged on GDMT, weight 171 lbs.  He returned 07/14/20 for post hospital HF follow up. Feeling terrible, extremely fatigued. 11 steps from bed to bathroom, and he has to take a break to walk that. Living in hotel since discharge. Having some dizziness. Has friend who brings him food. Too weak to cook. Denies  increasing SOB, CP, edema, or PND/Orthopnea. Appetite poor, vomited today, constipated. No fever or chills. Not checking weight at home, has scale. Has not taken [place] or Imdur, too expensive. Patient's ex-wife says he is not doing well. Refusing LifeVest, has had it in past and said it was uncomfortable.   Admitted from AHF clinic 07/14/20 for dehydration. Also found to have bed bug infestation. Hospitalization c/b digoxin toxicity and AKI. Diuretics/ BP active meds + digoxin held. Insulin, Lokelma + calcium gluconate given for hyperkalemia, K 6.0>>5.3>>4.3. Hydrated w/ IVFs. SCr improving, down 5.5>>5.0>>4.5. Tele shows AFL w/ SVR 40s-50s. BP remains soft but overall improved, 21F systolic. AHF team consulted to assist w/ medication management/ elevated dig level. He was discharged to SNF, weight 176 lbs.  Today he returns for post hospitalization HF follow up. Now at [facility] facility, says he will go back to hotel when he is discharged from facility, but can go to a new room (without infestation).  Overall feeling much better. Some lightheadedness occasionally. Walking a lot. Denies increasing SOB, CP, dizziness, edema, or PND/Orthopnea. Appetite ok. No fever or chills. He does not know what medications he is taking.   ECG (personally reviewed): aflutter 73 bpm   Cardiac Studies: Echo (5/22) in cath lab with severe biventricular heart failure. EF < 20%.    Cath 5/22 Occluded distal LAD-->PCI.   IABP placed  Cath  RA 29 PWP 28 CO 2.2 CI 1.15 CPO .69 PAPi 1.0  Ost LAD to Prox LAD lesion is 20% stenosed. Mid LAD lesion is 20% stenosed. Dist LAD lesion is 100% stenosed. Ost RCA to Prox RCA lesion is 20% stenosed. Previously placed 1st Mrg stent (unknown type) is widely patent.     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  Labs (9/19): K 4.5, creatinine 2.5 Labs (10/19): K 3.7, creatinine 1.92 Labs (1/20): K  3.7, creatinine 2.35 Labs (5/22): K 4.0, creatinine 1.94   PMH: 1. HTN 2. CKD stage 3 3. COPD: Still smoking.   4. OSA 5. Hyperlipidemia 6. 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   - LHC (3/19): Done in [place].  Totally occluded PLV, 80% pLCx stenosis => DES to LCx.  LV-gram with EF 25-35%. - Echo (6/19): EF 25-30%, grade III diastolic dysfunction, moderate-severe MR, moderate-severe TR, PASP 57 mmHg.  - CPX (9/19): with peak VO2 17.8, VE/VCO2 slope 38, RER 1.11 => moderate functional impairment.  - Echo (1/20): EF 20-25%, severe LVH, mild MR, normal RV size and systolic function, PASP 33 mmHg.  - Echo (5/22): EF <20%, severe biV failure 7. CAD: LHC (3/19): Done in [place].  Totally occluded PLV, 80% pLCx stenosis => DES to LCx.  8. PVCs: Holter 9/19 with 2% PVCs.  9. Gout  Current Outpatient Medications  Medication Sig Dispense Refill   ACCU-CHEK FASTCLIX LANCETS MISC by Does not apply route.     albuterol (PROVENTIL HFA;VENTOLIN HFA) 108 (90 Base) MCG/ACT inhaler Inhale 2 puffs into the lungs every 6 (six) hours as needed for wheezing or shortness of breath. 1 Inhaler 2   apixaban (ELIQUIS) 5 MG TABS tablet Take 1 tablet (5 mg total) by mouth 2 (two) times daily. 120 tablet 2   atorvastatin (LIPITOR) 80 MG tablet Take 1 tablet (80 mg total) by mouth daily. Needs appt for future refills 30 tablet 0   Blood Glucose Monitoring Suppl (ACCU-CHEK AVIVA PLUS) w/Device KIT by Does not apply route.     clopidogrel (PLAVIX) 75 MG tablet TAKE 1 TABLET BY MOUTH ONCE DAILY . APPOINTMENT REQUIRED FOR FUTURE REFILLS 30 tablet 0   dapagliflozin propanediol (FARXIGA) 10 MG TABS tablet Take 1 tablet (10 mg total) by mouth daily. 60 tablet 2   docusate sodium (COLACE) 100 MG capsule Take 1 capsule (100 mg total) by mouth 2 (two) times daily. 10 capsule 0   glucose blood test strip 1 each by Other route as needed. Use as instructed      hydrALAZINE (APRESOLINE) 25 MG tablet TAKE 1 & 1/2 (ONE & ONE-HALF) TABLETS BY MOUTH THREE TIMES DAILY. NEED AN APPOINTMENT FOR FUTURE REFILLS 135 tablet 0   isosorbide mononitrate (IMDUR) 30 MG 24 hr tablet Take 1 tablet (30 mg total) by mouth daily. Needs appt for future refills 30 tablet 0   nitroGLYCERIN (NITROSTAT) 0.4 MG SL tablet Place 0.4 mg under the tongue every 5 (five) minutes as needed for chest pain.     pantoprazole (PROTONIX) 40 MG tablet Take 1 tablet (40 mg total) by mouth daily at 12 noon.     potassium chloride SA (KLOR-CON) 20 MEQ tablet Take 1 tablet (20 mEq total) by mouth daily. 60 tablet 2   spironolactone (ALDACTONE) 25 MG tablet Take 1 tablet by mouth once daily 90 tablet 0   torsemide (DEMADEX) 20 MG tablet Take 1 tablet (20 mg total) by mouth daily.     VISINE DRY EYE RELIEF 1 % SOLN Apply to eye.     No current facility-administered medications for this encounter.    Allergies:   Penicillins   Social History:  The patient  reports that he quit smoking about 2 years ago. His smoking use included cigarettes. He has a 45.00 pack-year smoking history. He has never used smokeless tobacco. He reports that he does not drink alcohol and does not use drugs.   Family History:  The patient's family history includes Cancer (age of onset: 64) in his mother; Stroke (age of onset: 72) in his father.   ROS:  Please see the history of present illness.   All other systems are personally reviewed and negative.   Recent Labs: 07/20/2020: TSH 0.964 07/23/2020: ALT 25; B Natriuretic Peptide 1,879.9; BUN 33; Creatinine, Ser 1.80; Hemoglobin 9.9; Magnesium 1.8; Platelets 101; Potassium 3.8; Sodium 135  Personally reviewed   Wt Readings from Last 3 Encounters:  08/05/20 74.8 kg (164 lb 12.8 oz)  07/22/20 80 kg (176 lb 5.9 oz)  06/29/20 77.9 kg (171 lb 11.8 oz)    BP 112/68   Pulse 66   Wt 74.8 kg (164 lb 12.8 oz)   SpO2 97%   BMI 25.81 kg/m   Physical Exam: General:  NAD. No  resp difficulty,  arrived in WC. HEENT: Normal Neck: Supple. No JVD. Carotids 2+ bilat; no bruits. No lymphadenopathy or thryomegaly appreciated. Cor: PMI nondisplaced. Irregular rate & rhythm. No rubs, gallops or murmurs. Lungs: Clear Abdomen: Soft, nontender, nondistended. No hepatosplenomegaly. No bruits or masses. Good bowel sounds. Extremities: No cyanosis, clubbing, rash, edema Neuro: Alert & oriented x 3, cranial nerves grossly intact. Moves all 4 extremities w/o difficulty. Affect pleasant.  ASSESSMENT AND PLAN: 1. 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  Recent admission 5/22 for CGS in setting of acute STEMI requiring PCI + IABP support. Echo 5/22 showed EF 20-25%, moderately decreased RV function with mild RVE, moderate biatrial enlargement, PASP 34, mild MR, mild TR, IVC normal. Stabilized and weaned off IABP to GDMT. Refused LifeVest at d/c. Recent admission for volume depletion and AKI. Improved w/ IVFs. Scr back to baseline. NYHA II, he is not volume overloaded today. - Continue torsemide 20 mg daily. - Continue spiro 25 mg daily. - Continue Farxiga 10 mg daily. - Continue hydralazine 1.5 tab tid + Imdur 30 mg daily. - Off digoxin w/ elevated level. - Off beta blocker due to low flow state. - BMET today.  2. AFlutter: Atypical. Diagnosed previous admit 5/22. S/p TEE/DC-CV on 5/27. Back in AFL, V-rates currently 70s.  - Dig discontinued. - Continue with Eliquis. Has not missed any doses. CBC today. - Start amio 200 mg bid. - Repeat DCCV 2-3 weeks with Dr. McLean (personally discussed).   3. CKD 4 - Baseline Scr ~ 1.9-2.1. - Labs today.   6. CAD:   - H/o DES to LCX in 3/19, had occluded PLV at that time. Recently admitted 5/22 with anterior STEMI and occluded mLAD, had DES to mLAD, known CTO PLV with collaterals and patent LCx stent.  No s/s ischemia. - Continue ASA 81, Plavix +  atorva.  - Continue Eliquis.   - Follow up in 2-3 weeks with APP. Will repeat BMET & ECG; anticipate scheduling DCCV at this time.   - Will refer to Paramedicine to help with medication compliance when he is discharged from facility. Will be imperative he does not miss any Eliquis doses before DCCV.  Jessica Milford, FNP-BC 08/05/20 

## 2020-08-05 NOTE — Patient Instructions (Addendum)
Start amiodarone 200 mg, one tab twice daily  Labs today We will only contact you if something comes back abnormal or we need to make some changes. Otherwise no news is good news!  You have been referred to [place] for further CHF management in the comfort of your home. A team member will be in contact with you to arrange a home visit  Your physician recommends that you schedule a follow-up appointment in: 2-3 weeks  in the Advanced Practitioners (PA/NP) Clinic   Do the following things EVERYDAY: Weigh yourself in the morning before breakfast. Write it down and keep it in a log. Take your medicines as prescribed Eat low salt foods--Limit salt (sodium) to 2000 mg per day.  Stay as active as you can everyday Limit all fluids for the day to less than 2 liters  At the [facility], you and your health needs are our priority. As part of our continuing mission to provide you with exceptional heart care, we have created designated Provider Care Teams. These Care Teams include your primary Cardiologist (physician) and Advanced Practice Providers (APPs- Physician Assistants and Nurse Practitioners) who all work together to provide you with the care you need, when you need it.   You may see any of the following providers on your designated Care Team at your next follow up: Dr Glori Bickers Dr Loralie Champagne Dr Patrice Paradise, NP Lyda Jester, [place] Ginnie Smart Audry Riles, PharmD   Please be sure to bring in all your medications bottles to every appointment.   If you have any questions or concerns before your next appointment please send [place] a message through [facility] or call our office at [phone].    TO LEAVE A MESSAGE FOR THE NURSE SELECT OPTION 2, PLEASE LEAVE A MESSAGE INCLUDING: YOUR NAME DATE OF BIRTH CALL BACK NUMBER REASON FOR CALL**this is important as we prioritize the call backs  YOU WILL RECEIVE A CALL BACK  THE SAME DAY AS LONG AS YOU CALL BEFORE 4:00 PM

## 2020-08-12 ENCOUNTER — Telehealth (HOSPITAL_COMMUNITY): Payer: Self-pay | Admitting: Cardiology

## 2020-08-12 NOTE — Telephone Encounter (Signed)
Unable to reach pt via telephone letter mailed

## 2020-08-18 ENCOUNTER — Other Ambulatory Visit (HOSPITAL_COMMUNITY): Payer: Self-pay | Admitting: Family Medicine

## 2020-08-18 ENCOUNTER — Telehealth (HOSPITAL_COMMUNITY): Payer: Self-pay | Admitting: *Deleted

## 2020-08-18 ENCOUNTER — Telehealth (HOSPITAL_COMMUNITY): Payer: Self-pay | Admitting: Family Medicine

## 2020-08-18 MED ORDER — ONDANSETRON HCL 4 MG PO TABS: 4.0000 mg | ORAL_TABLET | Freq: Three times a day (TID) | ORAL | 0 refills | Status: DC | PRN

## 2020-08-18 NOTE — Telephone Encounter (Signed)
Called to discuss cardioversion with patient. No answer, LMTCB.   Allena Katz, FNP-BC

## 2020-08-18 NOTE — Telephone Encounter (Signed)
Per Darden Palmer try to take medications especially amiodarone and eliquis. Zofran sent to [facility] in [place] for nausea. Take 1 every 8 hours as needed. Schedule appt for Wednesday at 10am. Pt aware and agreeable with plan.

## 2020-08-18 NOTE — Telephone Encounter (Addendum)
Pt called LVAD line and spoke with Cloyde Reams Reece,RN pt complained of dizziness. Epic was down at the time so RN was unable to review medications. Pt stated he did not know which mediations he was taking. I called pt to get more information no answer left VM to return call. Pt called from [phone].

## 2020-08-18 NOTE — Telephone Encounter (Signed)
Pt returned my call stating dizziness started yesterday morning and worsened throughout the day. Pt is unable to check bp at home. Pt has not taken morning meds because when he took his evening meds he immediately became sick and had to vomit. Patient has a scheduled office visit with our clinic next week. I offered pt a sooner appt today at 12pm but pt said he wont be able to make that appt. No other complaints at this time. Will follow up with provider and call pt back.

## 2020-08-20 ENCOUNTER — Other Ambulatory Visit: Payer: Self-pay

## 2020-08-20 ENCOUNTER — Inpatient Hospital Stay: Payer: Self-pay

## 2020-08-20 ENCOUNTER — Ambulatory Visit (HOSPITAL_COMMUNITY)
Admission: RE | Admit: 2020-08-20 | Discharge: 2020-08-20 | Disposition: A | Discharge status: Home / Self Care | Payer: Self-pay | Source: Ambulatory Visit | Attending: Family Medicine | Admitting: Family Medicine

## 2020-08-20 ENCOUNTER — Encounter (HOSPITAL_COMMUNITY): Payer: Self-pay

## 2020-08-20 ENCOUNTER — Inpatient Hospital Stay (HOSPITAL_COMMUNITY)
Admission: EM | Admit: 2020-08-20 | Discharge: 2020-09-01 | DRG: 286 | Disposition: A | Discharge status: Home Health | Payer: Medicare Other | Source: Ambulatory Visit | Attending: Cardiology | Admitting: Cardiology

## 2020-08-20 ENCOUNTER — Inpatient Hospital Stay: Admission: AD | Admit: 2020-08-20 | Payer: Self-pay | Source: Ambulatory Visit | Admitting: Internal Medicine

## 2020-08-20 VITALS — BP 126/97 | HR 78 | Wt 153.6 lb

## 2020-08-20 DIAGNOSIS — Z7901 Long term (current) use of anticoagulants: Secondary | ICD-10-CM

## 2020-08-20 DIAGNOSIS — N179 Acute kidney failure, unspecified: Secondary | ICD-10-CM | POA: Diagnosis present

## 2020-08-20 DIAGNOSIS — D509 Iron deficiency anemia, unspecified: Secondary | ICD-10-CM | POA: Diagnosis present

## 2020-08-20 DIAGNOSIS — J449 Chronic obstructive pulmonary disease, unspecified: Secondary | ICD-10-CM | POA: Diagnosis present

## 2020-08-20 DIAGNOSIS — M109 Gout, unspecified: Secondary | ICD-10-CM | POA: Diagnosis present

## 2020-08-20 DIAGNOSIS — Z79899 Other long term (current) drug therapy: Secondary | ICD-10-CM | POA: Insufficient documentation

## 2020-08-20 DIAGNOSIS — I130 Hypertensive heart and chronic kidney disease with heart failure and stage 1 through stage 4 chronic kidney disease, or unspecified chronic kidney disease: Principal | ICD-10-CM | POA: Diagnosis present

## 2020-08-20 DIAGNOSIS — I480 Paroxysmal atrial fibrillation: Secondary | ICD-10-CM | POA: Diagnosis present

## 2020-08-20 DIAGNOSIS — N184 Chronic kidney disease, stage 4 (severe): Secondary | ICD-10-CM | POA: Insufficient documentation

## 2020-08-20 DIAGNOSIS — N183 Chronic kidney disease, stage 3 unspecified: Secondary | ICD-10-CM

## 2020-08-20 DIAGNOSIS — I509 Heart failure, unspecified: Secondary | ICD-10-CM

## 2020-08-20 DIAGNOSIS — I4892 Unspecified atrial flutter: Secondary | ICD-10-CM | POA: Diagnosis present

## 2020-08-20 DIAGNOSIS — E1122 Type 2 diabetes mellitus with diabetic chronic kidney disease: Secondary | ICD-10-CM | POA: Diagnosis present

## 2020-08-20 DIAGNOSIS — Z7902 Long term (current) use of antithrombotics/antiplatelets: Secondary | ICD-10-CM | POA: Insufficient documentation

## 2020-08-20 DIAGNOSIS — Z880 Allergy status to penicillin: Secondary | ICD-10-CM | POA: Insufficient documentation

## 2020-08-20 DIAGNOSIS — I252 Old myocardial infarction: Secondary | ICD-10-CM

## 2020-08-20 DIAGNOSIS — N1832 Chronic kidney disease, stage 3b: Secondary | ICD-10-CM

## 2020-08-20 DIAGNOSIS — Z823 Family history of stroke: Secondary | ICD-10-CM

## 2020-08-20 DIAGNOSIS — I2510 Atherosclerotic heart disease of native coronary artery without angina pectoris: Secondary | ICD-10-CM | POA: Diagnosis present

## 2020-08-20 DIAGNOSIS — Z7984 Long term (current) use of oral hypoglycemic drugs: Secondary | ICD-10-CM | POA: Insufficient documentation

## 2020-08-20 DIAGNOSIS — R680 Hypothermia, not associated with low environmental temperature: Secondary | ICD-10-CM | POA: Diagnosis not present

## 2020-08-20 DIAGNOSIS — R570 Cardiogenic shock: Secondary | ICD-10-CM

## 2020-08-20 DIAGNOSIS — F17210 Nicotine dependence, cigarettes, uncomplicated: Secondary | ICD-10-CM | POA: Diagnosis present

## 2020-08-20 DIAGNOSIS — M25422 Effusion, left elbow: Secondary | ICD-10-CM | POA: Diagnosis present

## 2020-08-20 DIAGNOSIS — I429 Cardiomyopathy, unspecified: Secondary | ICD-10-CM | POA: Insufficient documentation

## 2020-08-20 DIAGNOSIS — E876 Hypokalemia: Secondary | ICD-10-CM | POA: Diagnosis not present

## 2020-08-20 DIAGNOSIS — Z66 Do not resuscitate: Secondary | ICD-10-CM | POA: Diagnosis present

## 2020-08-20 DIAGNOSIS — D649 Anemia, unspecified: Secondary | ICD-10-CM

## 2020-08-20 DIAGNOSIS — Z597 Insufficient social insurance and welfare support: Secondary | ICD-10-CM

## 2020-08-20 DIAGNOSIS — N270 Small kidney, unilateral: Secondary | ICD-10-CM | POA: Diagnosis present

## 2020-08-20 DIAGNOSIS — R278 Other lack of coordination: Secondary | ICD-10-CM | POA: Diagnosis present

## 2020-08-20 DIAGNOSIS — Z955 Presence of coronary angioplasty implant and graft: Secondary | ICD-10-CM | POA: Insufficient documentation

## 2020-08-20 DIAGNOSIS — Z20822 Contact with and (suspected) exposure to covid-19: Secondary | ICD-10-CM | POA: Diagnosis present

## 2020-08-20 DIAGNOSIS — I5023 Acute on chronic systolic (congestive) heart failure: Secondary | ICD-10-CM | POA: Diagnosis present

## 2020-08-20 DIAGNOSIS — I5021 Acute systolic (congestive) heart failure: Secondary | ICD-10-CM | POA: Diagnosis present

## 2020-08-20 DIAGNOSIS — M25522 Pain in left elbow: Secondary | ICD-10-CM

## 2020-08-20 DIAGNOSIS — I5022 Chronic systolic (congestive) heart failure: Secondary | ICD-10-CM

## 2020-08-20 LAB — CBC
HCT: 52 % (ref 39.0–52.0)
HCT: 45.7 % (ref 39.0–52.0)
Hemoglobin: 16.3 g/dL (ref 13.0–17.0)
Hemoglobin: 14.7 g/dL (ref 13.0–17.0)
MCH: 27.9 pg (ref 26.0–34.0)
MCH: 28.3 pg (ref 26.0–34.0)
MCHC: 31.3 g/dL (ref 30.0–36.0)
MCHC: 32.2 g/dL (ref 30.0–36.0)
MCV: 88.9 fL (ref 80.0–100.0)
MCV: 88.1 fL (ref 80.0–100.0)
Platelets: 253 10*3/uL (ref 150–400)
Platelets: 245 10*3/uL (ref 150–400)
RBC: 5.85 MIL/uL — ABNORMAL HIGH (ref 4.22–5.81)
RBC: 5.19 MIL/uL (ref 4.22–5.81)
RDW: 18.7 % — ABNORMAL HIGH (ref 11.5–15.5)
RDW: 18 % — ABNORMAL HIGH (ref 11.5–15.5)
WBC: 6.9 10*3/uL (ref 4.0–10.5)
WBC: 5.3 10*3/uL (ref 4.0–10.5)
nRBC: 0 % (ref 0.0–0.2)
nRBC: 0 % (ref 0.0–0.2)

## 2020-08-20 LAB — BASIC METABOLIC PANEL
Anion gap: 20 — ABNORMAL HIGH (ref 5–15)
BUN: 81 mg/dL — ABNORMAL HIGH (ref 8–23)
CO2: 18 mmol/L — ABNORMAL LOW (ref 22–32)
Calcium: 9.9 mg/dL (ref 8.9–10.3)
Chloride: 99 mmol/L (ref 98–111)
Creatinine, Ser: 3.93 mg/dL — ABNORMAL HIGH (ref 0.61–1.24)
GFR, Estimated: 15 mL/min — ABNORMAL LOW (ref >60)
Glucose, Bld: 105 mg/dL — ABNORMAL HIGH (ref 70–99)
Potassium: 4.3 mmol/L (ref 3.5–5.1)
Sodium: 137 mmol/L (ref 135–145)

## 2020-08-20 LAB — RESP PANEL BY RT-PCR (FLU A&B, COVID) ARPGX2
Influenza A by PCR: NEGATIVE
Influenza B by PCR: NEGATIVE
SARS Coronavirus 2 by RT PCR: NEGATIVE

## 2020-08-20 LAB — GLUCOSE, CAPILLARY
Glucose-Capillary: 111 mg/dL — ABNORMAL HIGH (ref 70–99)
Glucose-Capillary: 331 mg/dL — ABNORMAL HIGH (ref 70–99)

## 2020-08-20 LAB — BRAIN NATRIURETIC PEPTIDE: B Natriuretic Peptide: 338.1 pg/mL — ABNORMAL HIGH (ref 0.0–100.0)

## 2020-08-20 MED ORDER — HYDRALAZINE HCL 25 MG PO TABS
12.5000 mg | ORAL_TABLET | Freq: Three times a day (TID) | ORAL | Status: DC
  Administered 2020-08-20: 12.5 mg via ORAL
  Filled 2020-08-20 (×3): qty 1

## 2020-08-20 MED ORDER — CLOPIDOGREL BISULFATE 75 MG PO TABS
75.0000 mg | ORAL_TABLET | Freq: Every day | ORAL | Status: DC
  Administered 2020-08-20 – 2020-09-01 (×13): 75 mg via ORAL
  Filled 2020-08-20 (×13): qty 1

## 2020-08-20 MED ORDER — APIXABAN 5 MG PO TABS
5.0000 mg | ORAL_TABLET | Freq: Two times a day (BID) | ORAL | Status: AC
  Administered 2020-08-20 – 2020-08-21 (×2): 5 mg via ORAL
  Filled 2020-08-20 (×2): qty 1

## 2020-08-20 MED ORDER — AMIODARONE HCL 200 MG PO TABS
200.0000 mg | ORAL_TABLET | Freq: Two times a day (BID) | ORAL | Status: DC
  Administered 2020-08-20 – 2020-09-01 (×24): 200 mg via ORAL
  Filled 2020-08-20 (×24): qty 1

## 2020-08-20 MED ORDER — SODIUM CHLORIDE 0.9 % IV SOLN: INTRAVENOUS | Status: DC

## 2020-08-20 MED ORDER — PANTOPRAZOLE SODIUM 40 MG PO TBEC
40.0000 mg | DELAYED_RELEASE_TABLET | Freq: Every day | ORAL | Status: DC
  Administered 2020-08-21 – 2020-09-01 (×11): 40 mg via ORAL
  Filled 2020-08-20 (×12): qty 1

## 2020-08-20 MED ORDER — ACETAMINOPHEN 325 MG PO TABS
650.0000 mg | ORAL_TABLET | ORAL | Status: DC | PRN
  Administered 2020-08-22 – 2020-08-31 (×6): 650 mg via ORAL
  Filled 2020-08-20 (×7): qty 2

## 2020-08-20 MED ORDER — SODIUM CHLORIDE 0.9 % IV SOLN
250.0000 mL | INTRAVENOUS | Status: DC | PRN
  Administered 2020-08-23: 250 mL via INTRAVENOUS

## 2020-08-20 MED ORDER — MILRINONE LACTATE IN DEXTROSE 20-5 MG/100ML-% IV SOLN
0.1250 ug/kg/min | INTRAVENOUS | Status: DC
  Administered 2020-08-20 – 2020-08-26 (×9): 0.25 ug/kg/min via INTRAVENOUS
  Administered 2020-08-27: 0.125 ug/kg/min via INTRAVENOUS
  Filled 2020-08-20 (×9): qty 100

## 2020-08-20 MED ORDER — ATORVASTATIN CALCIUM 80 MG PO TABS
80.0000 mg | ORAL_TABLET | Freq: Every day | ORAL | Status: DC
  Administered 2020-08-20 – 2020-09-01 (×13): 80 mg via ORAL
  Filled 2020-08-20 (×13): qty 1

## 2020-08-20 MED ORDER — DOCUSATE SODIUM 100 MG PO CAPS: 100.0000 mg | ORAL_CAPSULE | Freq: Two times a day (BID) | ORAL | Status: DC

## 2020-08-20 MED ORDER — SODIUM CHLORIDE 0.9% FLUSH: 3.0000 mL | INTRAVENOUS | Status: DC | PRN

## 2020-08-20 MED ORDER — SODIUM CHLORIDE 0.9% FLUSH
3.0000 mL | Freq: Two times a day (BID) | INTRAVENOUS | Status: DC
  Administered 2020-08-20 – 2020-08-24 (×6): 3 mL via INTRAVENOUS
  Administered 2020-08-26: 10 mL via INTRAVENOUS
  Administered 2020-08-28 – 2020-09-01 (×7): 3 mL via INTRAVENOUS

## 2020-08-20 MED ORDER — ISOSORBIDE MONONITRATE ER 30 MG PO TB24
15.0000 mg | ORAL_TABLET | Freq: Every day | ORAL | Status: DC
  Administered 2020-08-20: 15 mg via ORAL
  Filled 2020-08-20: qty 1

## 2020-08-20 NOTE — H&P (Signed)
Advanced Heart Failure Team History and Physical Note This note reflects work that was completed today.   PCP:  Lillard Anes, [place]  PCP-Cardiology: None    HF Cardiology: Dr. Aundra Dubin  Reason for Admission: Acute on chronic systolic heart failure  HPI:   Patient has a history of CAD, cardiomyopathy, HTN, and smoking (quit smoking in 2/19) with COPD.  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  He had had one prior episode of CHF in 2016 where he was admitted at [facility] (no records available).  At the hospital in [place], he had LHC showed occluded PLV and 80% stenosis proximal LCx.  EF was 25-35% by LV-gram.  He had DES placed to pLCx.     Echo was done in 6/19.  This showed EF 25-30% with moderate-severe MR, moderate-severe TR. He saw Dr. Caryl Comes for ICD consideration, given frequent PVCs a holter was placed in 9/19 => 2% PVCs.  CPX was done in 9/19 showing moderate functional impairment from heart failure. Repeat echo in 1/20 showed EF 20-25% with severe LVH, mild MR, normal RV.   He continues to smoke 1 ppd.  He gets short of breath with heavy lifting/carrying as well as doing yardwork on a hot day, dyspnea mildly worse recently.  No dyspnea walking on flat ground.  No chest pain.  No claudication.  He has left 1st MTP joint pain, the joint is warm as well. No edema.  Weight is up about 6 lbs. Mr Diel is a 70 year old with a history of CAD, chronic systolic heart failure, HTN, COPD, OSA, and smoking (quit smoking in 2/19) with COPD.     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  He had had one prior episode of CHF in 2016 where he was admitted at [facility] (no records available).  At the hospital in [place], he had LHC showed occluded PLV and 80% stenosis proximal LCx.  EF was 25-35% by LV-gram.  He had DES placed to pLCx.      Echo was done in 6/19.  This showed EF 25-30% with moderate-severe MR, moderate-severe TR. He saw Dr. Caryl Comes for ICD consideration, given frequent PVCs a holter was placed in 9/19 => 2% PVCs.  CPX was done in 9/19 showing moderate functional impairment from heart failure. Repeat echo in 1/20 showed EF 20-25% with severe LVH, mild MR, normal RV.    He had been followed by Dr Aundra Dubin and had a virtual visit back to 2020. He has not been seen since that time because he was concerned about COVID. He has been of meds for some time.   He was sent to the ED from PCP office 5/22 for volume overload. Echo showed an EF at 30%. Started on IV Lasix. EKG showed anterior lead STE and he was transferred to [facility] for emergent LHC and shock team evaluation.   LHC 06/20/2020 showed distally occluded LAD with PCI placement. IABP was placed due to cardiogenic shock. Swan numbers suggestive of severe biventricular failure/shock Echo with LVEF 15% RV severely decreased with severe TR and moderate to severe MR. No VSD. Placed on milrinone, NE, IV Lasix. He was noted to be in new onset atrial fibrillation. TEE/DCCV performed 06/27/20 to NSR with no complication.   Given anterior MI and LVEF <35%, Dr. Aundra Dubin recommended pt  be discharged home w/ LifeVest for primary prevention, however pt declined, citing he wore a LifeVest several years ago when he lived in [place] and could not tolerate wearing it. He was informed of indication and risk of being discharged w/o protection. He understood the risk and still declined LifeVest. He was discharged on GDMT, weight 171 lbs.   He returned 07/14/20 for post hospital HF follow up. Feeling terrible, extremely fatigued. 11 steps from bed to bathroom, and he has to take a break to walk that. Living in hotel since discharge. Having some dizziness. Has friend who brings him food. Too weak to cook. Denies increasing SOB, CP, edema, or PND/Orthopnea. Appetite poor, vomited today, constipated. No fever or  chills. Not checking weight at home, has scale. Has not taken [place] or Imdur, too expensive. Patient's ex-wife says he is not doing well. Refusing LifeVest, has had it in past and said it was uncomfortable.   Admitted from AHF clinic 07/14/20 for dehydration. Also found to have bed bug infestation. Hospitalization c/b digoxin toxicity and AKI. Diuretics/ BP active meds + digoxin held. Insulin, Lokelma + calcium gluconate given for hyperkalemia, K 6.0>>5.3>>4.3. Hydrated w/ IVFs. SCr improving, down 5.5>>5.0>>4.5. Tele shows AFL w/ SVR 40s-50s. BP remains soft but overall improved, 99H systolic. AHF team consulted to assist w/ medication management/ elevated dig level. He was discharged to SNF, weight 176 lbs.   He returns today for a "sick visit". Overall feeling poorly, struggling with nausea, vomiting and dizziness. Fatigued at rest. Breathing OK, no CP. Does not weigh at home, out of facility and back in motel. Has not been able to keep down medications since Monday. His SCr was elevated last visit however we have been unable to reach him for medication changes and repeat labs. Last visit he felt much better, euvolemic, but was still in AFL, rate controlled. We started amio 200 mg bid with plans for DCCV if he did not convert in the next 2-3 weeks.      Review of Systems: [y] = yes, [ ]  = no   General: Weight gain [ ] ; Weight loss [ y]; Anorexia [ y]; Fatigue Blue.Reese ]; Fever [ ] ; Chills [ ] ; Weakness Blue.Reese ]  Cardiac: Chest pain/pressure [ ] ; Resting SOB [ ] ; Exertional SOB Blue.Reese ]; Orthopnea [ ] ; Pedal Edema [ ] ; Palpitations [ ] ; Syncope [ ] ; Presyncope [ ] ; Paroxysmal nocturnal dyspnea[ ]   Pulmonary: Cough [ ] ; Wheezing[ ] ; Hemoptysis[ ] ; Sputum [ ] ; Snoring [ ]   GI: Vomiting[ ] ; Dysphagia[ ] ; Melena[ ] ; Hematochezia [ ] ; Heartburn[ ] ; Abdominal pain [ ] ; Constipation [ ] ; Diarrhea [ ] ; BRBPR [ ]   GU: Hematuria[ ] ; Dysuria [ ] ; Nocturia[ ]   Vascular: Pain in legs with walking [ ] ; Pain in feet with lying  flat [ ] ; Non-healing sores [ ] ; Stroke [ ] ; TIA [ ] ; Slurred speech [ ] ;  Neuro: Headaches[ ] ; Vertigo[ ] ; Seizures[ ] ; Paresthesias[ ] ;Blurred vision [ ] ; Diplopia [ ] ; Vision changes [ ]   Ortho/Skin: Arthritis [ ] ; Joint pain [ ] ; Muscle pain [ ] ; Joint swelling [ ] ; Back Pain [ ] ; Rash [ ]   Psych: Depression[ ] ; Anxiety[ ]   Heme: Bleeding problems [ ] ; Clotting disorders [ ] ; Anemia [ ]   Endocrine: Diabetes [ ] ; Thyroid dysfunction[ ]    Home Medications Prior to Admission medications   Medication Sig Start Date End Date Taking? Authorizing Provider  ACCU-CHEK FASTCLIX LANCETS MISC by Does not apply route.    [provider]  albuterol (PROVENTIL HFA;VENTOLIN HFA) 108 (90 Base) MCG/ACT inhaler Inhale 2 puffs into the lungs every 6 (six) hours as needed for wheezing or shortness of breath. 08/05/17   Lauraine Rinne, NP  amiodarone (PACERONE) 200 MG tablet Take 1 tablet (200 mg total) by mouth 2 (two) times daily. 08/05/20   Trinadee Verhagen, Maricela Bo, FNP  apixaban (ELIQUIS) 5 MG TABS tablet Take 1 tablet (5 mg total) by mouth 2 (two) times daily. 06/29/20   Tommie Raymond, NP  atorvastatin (LIPITOR) 80 MG tablet Take 1 tablet (80 mg total) by mouth daily. Needs appt for future refills 01/09/20   Larey Dresser, MD  Blood Glucose Monitoring Suppl (ACCU-CHEK AVIVA PLUS) w/Device KIT by Does not apply route.    [provider]  clopidogrel (PLAVIX) 75 MG tablet TAKE 1 TABLET BY MOUTH ONCE DAILY . APPOINTMENT REQUIRED FOR FUTURE REFILLS 01/09/20   Larey Dresser, MD  dapagliflozin propanediol (FARXIGA) 10 MG TABS tablet Take 1 tablet (10 mg total) by mouth daily. 06/30/20   Kathyrn Drown D, NP  docusate sodium (COLACE) 100 MG capsule Take 1 capsule (100 mg total) by mouth 2 (two) times daily. 07/23/20   Thurnell Lose, MD  glucose blood test strip 1 each by Other route as needed. Use as instructed    [provider]  hydrALAZINE (APRESOLINE) 25 MG tablet TAKE 1 & 1/2 (ONE &  ONE-HALF) TABLETS BY MOUTH THREE TIMES DAILY. NEED AN APPOINTMENT FOR FUTURE REFILLS 04/04/20   Larey Dresser, MD  isosorbide mononitrate (IMDUR) 30 MG 24 hr tablet Take 1 tablet (30 mg total) by mouth daily. Needs appt for future refills 01/09/20   Larey Dresser, MD  nitroGLYCERIN (NITROSTAT) 0.4 MG SL tablet Place 0.4 mg under the tongue every 5 (five) minutes as needed for chest pain.    [provider]  ondansetron (ZOFRAN) 4 MG tablet Take 1 tablet (4 mg total) by mouth every 8 (eight) hours as needed for nausea or vomiting. 08/18/20 08/18/21  Rafael Bihari, FNP  pantoprazole (PROTONIX) 40 MG tablet Take 1 tablet (40 mg total) by mouth daily at 12 noon. 07/23/20   Thurnell Lose, MD  potassium chloride SA (KLOR-CON) 20 MEQ tablet Take 1 tablet (20 mEq total) by mouth daily. 06/29/20   Kathyrn Drown D, NP  spironolactone (ALDACTONE) 25 MG tablet Take 1 tablet by mouth once daily 09/24/19   Larey Dresser, MD  torsemide (DEMADEX) 20 MG tablet Take 1 tablet (20 mg total) by mouth daily. 07/24/20   Thurnell Lose, MD  VISINE DRY EYE RELIEF 1 % SOLN Apply to eye. 07/23/20   [provider]    Past Medical History: Past Medical History:  Diagnosis Date   Abnormal EKG 03/2009   CAD (coronary artery disease)    CKD (chronic kidney disease)    Heart failure with preserved left ventricular function (HFpEF) ([place])    Hypertension    OSA (obstructive sleep apnea)    Sexual dysfunction    Type 2 diabetes mellitus with diabetic nephropathy ([place]) 06/18/2020    Past Surgical History: Past Surgical History:  Procedure Laterality Date   APPENDECTOMY  1969   ARTERIAL LINE INSERTION N/A 06/20/2020   Procedure: ARTERIAL LINE INSERTION;  Surgeon: Troy Sine, MD;  Location: [place];  Service: Cardiovascular;  Laterality: N/A;   CARDIAC CATHETERIZATION     CARDIOVERSION N/A 06/27/2020   Procedure: CARDIOVERSION;  Surgeon: Aundra Dubin,  Elby Showers, MD;  Location: [facility]  ENDOSCOPY;  Service: Cardiovascular;  Laterality: N/A;   CENTRAL LINE INSERTION  06/20/2020   Procedure: CENTRAL LINE INSERTION;  Surgeon: Troy Sine, MD;  Location: [place] [facility];  Service: Cardiovascular;;   CORONARY ANGIOPLASTY     CORONARY/GRAFT ACUTE MI REVASCULARIZATION N/A 06/20/2020   Procedure: Coronary/Graft Acute MI Revascularization;  Surgeon: Troy Sine, MD;  Location: [place] [facility];  Service: Cardiovascular;  Laterality: N/A;   IABP INSERTION N/A 06/20/2020   Procedure: IABP Insertion;  Surgeon: Troy Sine, MD;  Location: [place] [facility];  Service: Cardiovascular;  Laterality: N/A;   RIGHT/LEFT HEART CATH AND CORONARY ANGIOGRAPHY N/A 06/20/2020   Procedure: RIGHT/LEFT HEART CATH AND CORONARY ANGIOGRAPHY;  Surgeon: Troy Sine, MD;  Location: [place] [facility];  Service: Cardiovascular;  Laterality: N/A;   TEE WITHOUT CARDIOVERSION N/A 06/27/2020   Procedure: TRANSESOPHAGEAL ECHOCARDIOGRAM (TEE);  Surgeon: Larey Dresser, MD;  Location: [facility] ENDOSCOPY;  Service: Cardiovascular;  Laterality: N/A;    Family History:  Family History  Problem Relation Age of Onset   Cancer Mother 72   Stroke Father 86    Social History: Social History   Socioeconomic History   Marital status: Married    Spouse name: Not on file   Number of children: Not on file   Years of education: Not on file   Highest education level: Not on file  Occupational History   Not on file  Tobacco Use   Smoking status: Former    Packs/day: 1.00    Years: 45.00    Pack years: 45.00    Types: Cigarettes    Quit date: 2020    Years since quitting: 2.5   Smokeless tobacco: Never  Vaping Use   Vaping Use: Never used  Substance and Sexual Activity   Alcohol use: No   Drug use: No   Sexual activity: Not Currently  Other Topics Concern   Not on file  Social History Narrative   Not on file   Social Determinants of Health   Financial Resource Strain: Medium Risk    Difficulty of Paying Living Expenses: Somewhat hard  Food Insecurity: Food Insecurity Present   Worried About Charity fundraiser in the Last Year: Never true   Ran Out of Food in the Last Year: Sometimes true  Transportation Needs: No Transportation Needs   Lack of Transportation (Medical): No   Lack of Transportation (Non-Medical): No  Physical Activity: Not on file  Stress: Not on file  Social Connections: Not on file    Allergies:  Allergies  Allergen Reactions   Penicillins Other (See Comments)    Sweating     Objective:    Vital Signs:   Temp:  [97.7 F (36.5 C)] 97.7 F (36.5 C) (07/20 1233) Pulse Rate:  [47] 47 (07/20 1233) Resp:  [18] 18 (07/20 1233) BP: (117)/(86) 117/86 (07/20 1233) SpO2:  [98 %] 98 % (07/20 1233) Weight:  [69.4 kg] 69.4 kg (07/20 1247)   Filed Weights   08/20/20 1247  Weight: 69.4 kg    Physical Exam    General:  No resp difficulty, arrived in [facility], chronically fatigued appearing. HEENT: Normal Neck: Supple. No JVD. Carotids 2+ bilat; no bruits. No lymphadenopathy or thryomegaly appreciated. Cor: PMI nondisplaced. Irregular rate & rhythm. No rubs, gallops or murmurs. Lungs: Clear, diminished in bases Abdomen: Soft, nontender, nondistended. No hepatosplenomegaly. No bruits or masses. Good bowel sounds. Extremities: No cyanosis,  clubbing, rash, edema; cool extremities. Neuro: Alert & oriented x 3, cranial nerves grossly intact. Moves all 4 extremities w/o difficulty. Affect pleasant.  Telemetry    EKG   Atrial Flutter 108 bpm  Labs     Basic Metabolic Panel: Recent Labs  Lab 08/20/20 1002  NA 137  K 4.3  CL 99  CO2 18*  GLUCOSE 105*  BUN 81*  CREATININE 3.93*  CALCIUM 9.9    Liver Function Tests: No results for input(s): AST, ALT, ALKPHOS, BILITOT, PROT, ALBUMIN in the last 168 hours. No results for input(s): LIPASE, AMYLASE in the last 168 hours. No results for input(s): AMMONIA in the last 168 hours.  CBC: Recent  Labs  Lab 08/20/20 1002  WBC 6.9  HGB 16.3  HCT 52.0  MCV 88.9  PLT 253    Cardiac Enzymes: No results for input(s): CKTOTAL, CKMB, CKMBINDEX, TROPONINI in the last 168 hours.  BNP: BNP (last 3 results) Recent Labs    07/22/20 0109 07/23/20 0436 08/20/20 1002  BNP 2,071.8* 1,879.9* 338.1*    ProBNP (last 3 results) No results for input(s): PROBNP in the last 8760 hours.   CBG: No results for input(s): GLUCAP in the last 168 hours.  Coagulation Studies: No results for input(s): LABPROT, INR in the last 72 hours.  Imaging: No results found.   Patient Profile    Assessment/Plan   1. Acute on chronic Systolic Heart Failure - H/o mixed ischemic/nonischemic CMP (?HTN playing a role) with EF 20-25% with normal RV on prior echo.  Recent admission 5/22 for CGS in setting of acute STEMI requiring PCI + IABP support. Echo 5/22 showed EF 20-25%, moderately decreased RV function with mild RVE, moderate biatrial enlargement, PASP 34, mild MR, mild TR, IVC normal. Stabilized and weaned off IABP to GDMT. Refused LifeVest at d/c. Recent admission for volume depletion and AKI. Improved w/ IVFs. Scr back to baseline. Today he appears cold and dry, weight down 11 lbs from last visit. NYHA IIIb. - Stop torsemide, spiro and Farxiga. - Continue hydralazine 1.5 tab tid + Imdur 30 mg daily. - Off digoxin w/ elevated level. - Off beta blocker due to low flow state. - BMET & BNP today.   2. AFlutter: Atypical. Diagnosed previous admit 5/22. S/p TEE/DC-CV on 5/27. Back in AFL, V-rates currently 70s.  - Dig discontinued. - Continue with Eliquis. CBC today. - Continue amio 200 mg bid. - He is in a slower AFL today. - Consider timing of TEE/DCCV with Dr. Shirlee Latch. - He has missed some doses of Eliquis due to vomiting, would need TEE prior to DCCV to rule out LV thrombus.   3. AKI on CKD 4 - Baseline Scr ~ 1.9-2.1. - Labs today. SCr today up to 3.96 today. - Of note, labs on 08/05/20 SCr  2.6. Attempted to call patient however was unable to contact.   4. CAD:   - H/o DES to LCX in 3/19, had occluded PLV at that time. Recently admitted 5/22 with anterior STEMI and occluded mLAD, had DES to mLAD, known CTO PLV with collaterals and patent LCx stent.   - No s/s ischemia. - Continue ASA 81, Plavix + atorva. - Continue Eliquis.   - BMET from today shows worsening AKI. He appears low-output. Personally discussed with Dr. Shirlee Latch. He will need to be admitted to SDU, PICC placed, and initiate milrinone. Would benefit from Palliative consult. Sending patient to ED, no beds available. Rapid Response RN and ED charge RN called.  Rafael Bihari, FNP 08/20/2020, 1:33 PM  Advanced Heart Failure Team Pager [phone] (M-F; 7a - 5p)  Please contact [facility] for night-coverage after hours (4p -7a ) and weekends on [email or web address]  Patient seen with NP, agree with the above note.   See history well-described above.   Patient has been struggling since last discharge from the hospital.  He has had nausea, vomiting, dizziness since Sunday and has been unable to keep his meds down.  He has severe fatigue.   SBP 90s in office today.  ECG showed atypical flutter rate around 100.  Weight is trending down though REDS clip elevated at 41%.  Creatinine up to 3.93 from 2.6.   General: Thin, NAD Neck: No JVD, no thyromegaly or thyroid nodule. Lungs: Clear to auscultation bilaterally with normal respiratory effort. CV: Nondisplaced PMI.  Heart regular S1/S2, no S3/S4, no murmur.  No peripheral edema.  No carotid bruit.  Difficult to palpate pedal pulses. Abdomen: Soft, nontender, no hepatosplenomegaly, no distention. Skin: Intact without lesions or rashes. Neurologic: Alert and oriented x 3. Psych: Normal affect. Extremities: No clubbing or cyanosis. Cool.  HEENT: Normal.    Patient will need to be admitted due to AKI and concern for low output HF.    1. CAD: H/o DES to LCX in 3/19, had  occluded PLV at that time. Admitted with anterior STEMI 5/22 and occluded mLAD, had DES to mLAD, known CTO PLV with collaterals and patent LCx stent.  No chest pain currently.  - Continue Plavix, no ASA with apixaban use.  - Continue atorvastatin 80. 2. AKI on CKD IIIb: Creatinine up to 3.93 today, concern this is due to low output. - Hold torsemide, spironolactone, [place]. 3. 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  Last study in 5/22 was TEE showing EF 20-25%, moderately decreased RV function with mild RVE, moderate biatrial enlargement, PASP 34, mild MR, mild TR.  Cardiogenic shock during 5/22 admission, was eventually able to titrate off IABP and milrinone.  He has struggled since discharge, now with AKI and prominent fatigue, cool extremities.  I am concerned for low output HF.  He does not look volume overloaded on exam and weight is down, though REDS clip 41%.   - Stop torsemide, spironolactone, Farxiga - Off digoxin with recent AKI and high level/toxicity. - I will place PICC to follow CVP and co-ox (not candidate for HD with advanced HF). - Start milrinone 0.25 mcg/kg/min empirically for now.  - Cut hydralazine back to 12.5 mg tid and Imdur to 15 daily with soft BP. - Repeat echo. - Concerning clinical picture with suspected recurrent low output HF.  Would consider LVAD for him but will need improvement in creatinine and will need to make sure that RV can support.     4. COPD: Prior smoker 5. Atrial flutter: Atypical, had DCCV in 5/22 but now back in atrial flutter.  HR 100s.    - Continue amiodarone 200 mg bid.  - Continue apixaban. - Possible TEE-guided DCCV this admission.  Will need TEE as he has missed at least a few doses of apixaban due to nausea. 6. Thrombocytopenia: In hospital, resolved.    Loralie Champagne 08/20/2020 1:29 PM

## 2020-08-20 NOTE — ED Notes (Signed)
Attempted report x 2 

## 2020-08-20 NOTE — ED Provider Notes (Signed)
[facility] EMERGENCY DEPARTMENT Provider Note   CSN: [id number] Arrival date & time: 08/20/20  1219     History Chief Complaint  Patient presents with   Abnormal Lab    Kidney fct elevated    Douglas Villa is a 70 y.o. male.  HPI 70 year old male presents with acute kidney injury.  He was seen in the heart failure clinic today and referred to the ED for admission.  The patient felt poorly about 3 or 4 days ago for a couple days.  This included a couple episodes of vomiting and dizziness whenever he turned his head.  Those symptoms have resolved.  He has been losing weight.  He denies shortness of breath, chest pain or leg swelling.  No beds were immediately available in the hospital so he was sent to the ED to be admitted by Dr. Aundra Dubin.  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   Obstructive sleep apnea 08/05/2017   Hyperlipidemia 06/03/2017   Acute on chronic systolic heart failure ([place]) 06/03/2017   Chronic  kidney disease, stage 3 ([place]) 06/03/2017   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[place]) 06/03/2017   Essential hypertension 06/03/2017   Atherosclerotic heart disease of native coronary artery with unstable angina pectoris ([place]) 06/03/2017   Ex-smoker 06/03/2017    Past Surgical History:  Procedure Laterality Date   APPENDECTOMY  1969   ARTERIAL LINE INSERTION N/A 06/20/2020   Procedure: ARTERIAL LINE INSERTION;  Surgeon: Troy Sine, MD;  Location: [place] [facility];  Service: Cardiovascular;  Laterality: N/A;   CARDIAC CATHETERIZATION     CARDIOVERSION N/A 06/27/2020   Procedure: CARDIOVERSION;  Surgeon: Larey Dresser, MD;  Location: [facility] ENDOSCOPY;  Service: Cardiovascular;  Laterality: N/A;   CENTRAL LINE INSERTION  06/20/2020   Procedure: CENTRAL LINE INSERTION;  Surgeon: Troy Sine, MD;  Location: [place] [facility];  Service: Cardiovascular;;   CORONARY ANGIOPLASTY     CORONARY/GRAFT ACUTE MI REVASCULARIZATION N/A 06/20/2020   Procedure: Coronary/Graft Acute MI Revascularization;  Surgeon: Troy Sine, MD;  Location: [place] [facility];  Service: Cardiovascular;  Laterality: N/A;   IABP INSERTION N/A 06/20/2020   Procedure: IABP Insertion;  Surgeon: Troy Sine, MD;  Location: [place] [facility];  Service: Cardiovascular;  Laterality: N/A;   RIGHT/LEFT HEART CATH AND CORONARY ANGIOGRAPHY N/A 06/20/2020   Procedure: RIGHT/LEFT HEART CATH AND CORONARY ANGIOGRAPHY;  Surgeon: Troy Sine, MD;  Location: [place] [facility];  Service: Cardiovascular;  Laterality: N/A;  TEE WITHOUT CARDIOVERSION N/A 06/27/2020   Procedure: TRANSESOPHAGEAL ECHOCARDIOGRAM (TEE);  Surgeon: Larey Dresser, MD;  Location: [facility] ENDOSCOPY;  Service: Cardiovascular;  Laterality: N/A;       Family History  Problem Relation Age of Onset   Cancer Mother 106   Stroke Father 32    Social History   Tobacco Use   Smoking status: Former    Packs/day: 1.00    Years: 45.00    Pack years: 45.00    Types:  Cigarettes    Quit date: 2020    Years since quitting: 2.5   Smokeless tobacco: Never  Vaping Use   Vaping Use: Never used  Substance Use Topics   Alcohol use: No   Drug use: No    Home Medications Prior to Admission medications   Medication Sig Start Date End Date Taking? Authorizing Provider  ACCU-CHEK FASTCLIX LANCETS MISC by Does not apply route.    [provider]  albuterol (PROVENTIL HFA;VENTOLIN HFA) 108 (90 Base) MCG/ACT inhaler Inhale 2 puffs into the lungs every 6 (six) hours as needed for wheezing or shortness of breath. 08/05/17   Lauraine Rinne, NP  amiodarone (PACERONE) 200 MG tablet Take 1 tablet (200 mg total) by mouth 2 (two) times daily. 08/05/20   [place], Maricela Bo, FNP  apixaban (ELIQUIS) 5 MG TABS tablet Take 1 tablet (5 mg total) by mouth 2 (two) times daily. 06/29/20   Tommie Raymond, NP  atorvastatin (LIPITOR) 80 MG tablet Take 1 tablet (80 mg total) by mouth daily. Needs appt for future refills 01/09/20   Larey Dresser, MD  Blood Glucose Monitoring Suppl (ACCU-CHEK AVIVA PLUS) w/Device KIT by Does not apply route.    [provider]  clopidogrel (PLAVIX) 75 MG tablet TAKE 1 TABLET BY MOUTH ONCE DAILY . APPOINTMENT REQUIRED FOR FUTURE REFILLS 01/09/20   Larey Dresser, MD  dapagliflozin propanediol (FARXIGA) 10 MG TABS tablet Take 1 tablet (10 mg total) by mouth daily. 06/30/20   Kathyrn Drown D, NP  docusate sodium (COLACE) 100 MG capsule Take 1 capsule (100 mg total) by mouth 2 (two) times daily. 07/23/20   Thurnell Lose, MD  glucose blood test strip 1 each by Other route as needed. Use as instructed    [provider]  hydrALAZINE (APRESOLINE) 25 MG tablet TAKE 1 & 1/2 (ONE & ONE-HALF) TABLETS BY MOUTH THREE TIMES DAILY. NEED AN APPOINTMENT FOR FUTURE REFILLS 04/04/20   Larey Dresser, MD  isosorbide mononitrate (IMDUR) 30 MG 24 hr tablet Take 1 tablet (30 mg total) by mouth daily. Needs appt for future refills 01/09/20   Larey Dresser, MD  nitroGLYCERIN (NITROSTAT) 0.4 MG SL tablet Place 0.4 mg under the tongue every 5 (five) minutes as needed for chest pain.    [provider]  ondansetron (ZOFRAN) 4 MG tablet Take 1 tablet (4 mg total) by mouth every 8 (eight) hours as needed for nausea or vomiting. 08/18/20 08/18/21  Rafael Bihari, FNP  pantoprazole (PROTONIX) 40 MG tablet Take 1 tablet (40 mg total) by mouth daily at 12 noon. 07/23/20   Thurnell Lose, MD  potassium chloride SA (KLOR-CON) 20 MEQ tablet Take 1 tablet (20 mEq total) by mouth daily. 06/29/20   Kathyrn Drown D, NP  spironolactone (ALDACTONE) 25 MG tablet Take 1 tablet by mouth once daily 09/24/19   Larey Dresser, MD  torsemide (DEMADEX) 20 MG tablet Take 1 tablet (20 mg total) by mouth  daily. 07/24/20   Thurnell Lose, MD  VISINE DRY EYE RELIEF 1 % SOLN Apply to eye. 07/23/20   [provider]    Allergies    Penicillins  Review of Systems   Review of Systems  Constitutional:  Positive for unexpected weight change. Negative for fever.  Respiratory:  Negative for shortness of breath.   Cardiovascular:  Negative for chest pain.  Gastrointestinal:  Positive for vomiting.  Neurological:  Positive for dizziness and weakness.  All other systems reviewed and are negative.  Physical Exam Updated Vital Signs BP 117/86 (BP Location: Right Arm)   Pulse (!) 47   Temp 97.7 F (36.5 C) (Oral)   Resp 18   Ht _0  (1.702 m)   Wt 69.4 kg   SpO2 98%   BMI 23.96 kg/m   Physical Exam Vitals and nursing note reviewed.  Constitutional:      General: He is not in acute distress.    Appearance: He is well-developed. He is not ill-appearing.  HENT:     Head: Normocephalic and atraumatic.     Right Ear: External ear normal.     Left Ear: External ear normal.     Nose: Nose normal.  Eyes:     General:        Right eye: No discharge.        Left eye: No discharge.  Cardiovascular:     Rate and Rhythm: Regular rhythm.  Tachycardia present.     Heart sounds: Normal heart sounds.     Comments: HR ~100 Pulmonary:     Effort: Pulmonary effort is normal.     Breath sounds: Normal breath sounds.  Abdominal:     Palpations: Abdomen is soft.     Tenderness: There is no abdominal tenderness.  Musculoskeletal:     Cervical back: Neck supple.     Right lower leg: No edema.     Left lower leg: No edema.  Skin:    General: Skin is warm and dry.  Neurological:     Mental Status: He is alert.  Psychiatric:        Mood and Affect: Mood is not anxious.    ED Results / Procedures / Treatments   Labs (all labs ordered are listed, but only abnormal results are displayed) Labs Reviewed  RESP PANEL BY RT-PCR (FLU A&B, COVID) [id number]    EKG None  Radiology No results found.  Procedures Procedures   Medications Ordered in ED Medications - No data to display  ED Course  I have reviewed the triage vital signs and the nursing notes.  Pertinent labs & imaging results that were available during my care of the patient were reviewed by me and considered in my medical decision making (see chart for details).    MDM Rules/Calculators/A&P                           Patient's labs from clinic have been reviewed and he does have acute on chronic kidney injury.  Discussed with cardiology who will admit and asked for KVO fluids and they will put in bed request and orders. Final Clinical Impression(s) / ED Diagnoses Final diagnoses:  Acute kidney injury [facility])    Rx / DC Orders ED Discharge Orders     None        Sherwood Gambler, MD 08/20/20 1541

## 2020-08-20 NOTE — ED Triage Notes (Signed)
Pt came in for follow regarding CHF exacerbation that occurred 3 weeks ago. During follow up, pt found to have elevated kidney fct. Pt to be admitted per MD [place].

## 2020-08-20 NOTE — Progress Notes (Addendum)
[facility] NOTE   Date:  08/20/2020   ID:  [facility], DOB 11-10-1950, MRN [id number]  Location: Home  Provider location: [facility] Type of Visit: Established patient  PCP:  Doug[facility] Anes, [place]  Cardiologist:  Dr. Aundra [facility]  Chief Complaint: CHF follow up  History of Present Illness:  Patient has a history of CAD, cardiomyopathy, HTN, and smoking (quit smoking in 2/19) with COPD.  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  He was admitted to [facility] there with CHF symptoms.  He had had one prior episode of CHF in 2016 where he was admitted at [facility] (no records available).  At the [facility] in [place], he had LHC showed occluded PLV and 80% stenosis proximal LCx.  EF was 25-35% by LV-gram.  He had DES placed to pLCx.     Echo was done in 6/19.  This showed EF 25-30% with moderate-severe MR, moderate-severe TR. He saw Dr. Caryl [facility] for ICD consideration, given frequent PVCs a holter was placed in 9/19 => 2% PVCs.  CPX was done in 9/19 showing moderate functional impairment from heart failure. Repeat echo in 1/20 showed EF 20-25% with severe LVH, mild MR, normal RV.    He continues to smoke 1 ppd.  He gets short of breath with heavy lifting/carrying as well as doing yardwork on a hot day, dyspnea mildly worse recently.  No dyspnea walking on flat ground.  No chest pain.  No claudication.  He has left 1st MTP joint pain, the joint is warm as well. No edema.  Weight is up about 6 lbs.  Mr Douglas [facility] is a 70 year old with a history of CAD, chronic systolic heart failure, HTN, COPD, OSA, and smoking (quit smoking in 2/19) with COPD.     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  He was admitted to a [facility] there with CHF symptoms.  He had had one prior episode of CHF in 2016 where he was admitted at [facility] (no records available).  At the [facility] in [place], he had LHC showed occluded PLV and 80%  stenosis proximal LCx.  EF was 25-35% by LV-gram.  He had DES placed to pLCx.     Echo was done in 6/19.  This showed EF 25-30% with moderate-severe MR, moderate-severe TR. He saw Dr. Caryl [facility] for ICD consideration, given frequent PVCs a holter was placed in 9/19 => 2% PVCs.  CPX was done in 9/19 showing moderate functional impairment from heart failure. Repeat echo in 1/20 showed EF 20-25% with severe LVH, mild MR, normal RV.    He had been followed by Dr [facility] and had a virtual visit back to 2020. He has not been seen since that time because he was concerned about COVID. He has been of meds for some time.  He was sent to the ED from PCP office 5/22 for volume overload. Echo showed an EF at 30%. Started on IV Lasix. EKG showed anterior lead STE and he was transferred to [facility] for emergent LHC and shock team evaluation.   LHC 06/20/2020 showed distally occluded LAD with PCI placement. IABP was placed due to cardiogenic shock. Swan numbers suggestive of severe biventricular failure/shock Echo with LVEF 15% RV severely decreased with severe TR and moderate to severe MR. No VSD. Placed on milrinone, NE, IV Lasix. He was noted to be in new onset atrial fibrillation. TEE/DCCV performed 06/27/20 to NSR with  no complication.   Given anterior MI and LVEF <35%, Dr. Aundra Villa recommended pt be discharged home w/ LifeVest for primary prevention, however pt declined, citing he wore a LifeVest several years ago when he lived in [place] and could not tolerate wearing it. He was informed of indication and risk of being discharged w/o protection. He understood the risk and still declined LifeVest. He was discharged on GDMT, weight 171 lbs.  He returned 07/14/20 for post [facility] follow up. Feeling terrible, extremely fatigued. 11 steps from bed to bathroom, and he has to take a break to walk that. Living in hotel since discharge. Having some dizziness. Has friend who brings him food. Too weak to cook. Denies increasing SOB, CP,  edema, or PND/Orthopnea. Appetite poor, vomited today, constipated. No fever or chills. Not checking weight at home, has scale. Has not taken [place] or Imdur, too expensive. Patient's ex-wife says he is not doing well. Refusing LifeVest, has had it in past and said it was uncomfortable.   Admitted from [facility] 07/14/20 for dehydration. Also found to have bed bug infestation. Hospitalization c/b digoxin toxicity and AKI. Diuretics/ BP active meds + digoxin held. Insulin, Lokelma + calcium gluconate given for hyperkalemia, K 6.0>>5.3>>4.3. Hydrated w/ IVFs. SCr improving, down 5.5>>5.0>>4.5. Tele shows AFL w/ SVR 40s-50s. BP remains soft but overall improved, 01B systolic. AHF team consulted to assist w/ medication management/ elevated dig level. He was discharged to SNF, weight 176 lbs.  He returns today for a "sick visit". Overall feeling poorly, struggling with nausea, vomiting and dizziness. Has not been able to keep down medications since Monday. His SCr was elevated last visit however we have been unable to reach him for medication changes and repeat labs. Last visit he felt much better, euvolemic, but was still in AFL, rate controlled. We started amio 200 mg bid with plans for DCCV if he did not convert in the next 2-3 weeks. Denies increasing SOB, CP, dizziness, edema, or PND/Orthopnea. Appetite ok. No fever or chills. Weight at home 170 pounds. Taking all medications.   ReDs: 41% ECG (personally reviewed): slower AFL 108 (personally discussed with Dr. Aundra [facility] and [facility] Kilts, PA).  Cardiac Studies: Echo (5/22) in cath lab with severe biventricular heart failure. EF < 20%.    Cath 5/22 Occluded distal LAD-->PCI.   IABP placed  Cath  RA 29 PWP 28 CO 2.2 CI 1.15 CPO .14 PAPi 1.0  Ost LAD to Prox LAD lesion is 20% stenosed. Mid LAD lesion is 20% stenosed. Dist LAD lesion is 100% stenosed. Ost RCA to Prox RCA lesion is 20% stenosed. Previously placed 1st Mrg stent (unknown type) is  widely patent.     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  Labs (9/19): K 4.5, creatinine 2.5 Labs (10/19): K 3.7, creatinine 1.92 Labs (1/20): K 3.7, creatinine 2.35 Labs (5/22): K 4.0, creatinine 1.94   PMH: 1. HTN 2. CKD stage 3 3. COPD: Still smoking.   4. OSA 5. Hyperlipidemia 6. 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   - LHC (3/19): Done in [place].  Totally occluded PLV, 80% pLCx stenosis => DES to LCx.  LV-gram with EF 25-35%. - Echo (6/19): EF 25-30%, grade III diastolic dysfunction, moderate-severe MR, moderate-severe TR, PASP 57 mmHg.  - CPX (9/19): with peak VO2 17.8, VE/VCO2 slope 38, RER 1.11 => moderate functional impairment.  - Echo (1/20): EF  20-25%, severe LVH, mild MR, normal RV size and systolic function, PASP 33 mmHg.  - Echo (5/22): EF <20%, severe biV failure 7. CAD: LHC (3/19): Done in [place].  Totally occluded PLV, 80% pLCx stenosis => DES to LCx.  8. PVCs: Holter 9/19 with 2% PVCs.  9. Gout  Current Outpatient Medications  Medication Sig Dispense Refill   ACCU-CHEK FASTCLIX LANCETS MISC by Does not apply route.     albuterol (PROVENTIL HFA;VENTOLIN HFA) 108 (90 Base) MCG/ACT inhaler Inhale 2 puffs into the lungs every 6 (six) hours as needed for wheezing or shortness of breath. 1 Inhaler 2   amiodarone (PACERONE) 200 MG tablet Take 1 tablet (200 mg total) by mouth 2 (two) times daily. 60 tablet 3   apixaban (ELIQUIS) 5 MG TABS tablet Take 1 tablet (5 mg total) by mouth 2 (two) times daily. 120 tablet 2   atorvastatin (LIPITOR) 80 MG tablet Take 1 tablet (80 mg total) by mouth daily. Needs appt for future refills 30 tablet 0   Blood Glucose Monitoring Suppl (ACCU-CHEK AVIVA PLUS) w/Device KIT by Does not apply route.     clopidogrel (PLAVIX) 75 MG tablet TAKE 1 TABLET BY MOUTH ONCE DAILY . APPOINTMENT REQUIRED  FOR FUTURE REFILLS 30 tablet 0   dapagliflozin propanediol (FARXIGA) 10 MG TABS tablet Take 1 tablet (10 mg total) by mouth daily. 60 tablet 2   docusate sodium (COLACE) 100 MG capsule Take 1 capsule (100 mg total) by mouth 2 (two) times daily. 10 capsule 0   glucose blood test strip 1 each by Other route as needed. Use as instructed     hydrALAZINE (APRESOLINE) 25 MG tablet TAKE 1 & 1/2 (ONE & ONE-HALF) TABLETS BY MOUTH THREE TIMES DAILY. NEED AN APPOINTMENT FOR FUTURE REFILLS 135 tablet 0   isosorbide mononitrate (IMDUR) 30 MG 24 hr tablet Take 1 tablet (30 mg total) by mouth daily. Needs appt for future refills 30 tablet 0   nitroGLYCERIN (NITROSTAT) 0.4 MG SL tablet Place 0.4 mg under the tongue every 5 (five) minutes as needed for chest pain.     ondansetron (ZOFRAN) 4 MG tablet Take 1 tablet (4 mg total) by mouth every 8 (eight) hours as needed for nausea or vomiting. 12 tablet 0   pantoprazole (PROTONIX) 40 MG tablet Take 1 tablet (40 mg total) by mouth daily at 12 noon.     potassium chloride SA (KLOR-CON) 20 MEQ tablet Take 1 tablet (20 mEq total) by mouth daily. 60 tablet 2   spironolactone (ALDACTONE) 25 MG tablet Take 1 tablet by mouth once daily 90 tablet 0   torsemide (DEMADEX) 20 MG tablet Take 1 tablet (20 mg total) by mouth daily.     VISINE DRY EYE RELIEF 1 % SOLN Apply to eye.     No current facility-administered medications for this encounter.    Allergies:   Penicillins   Social History:  The patient  reports that he quit smoking about 2 years ago. His smoking use included cigarettes. He has a 45.00 pack-year smoking history. He has never used smokeless tobacco. He reports that he does not drink alcohol and does not use drugs.   Family History:  The patient's family history includes Cancer (age of onset: 90) in his mother; Stroke (age of onset: 23) in his father.   ROS:  Please see the history of present illness.   All other systems are personally reviewed and negative.    Recent Labs: 07/20/2020: TSH 0.964 07/23/2020: ALT  25; B Natriuretic Peptide 1,879.9; Magnesium 1.8 08/05/2020: BUN 45; Creatinine, Ser 2.60; Hemoglobin 12.7; Platelets 181; Potassium 4.4; Sodium 134  Personally reviewed   Wt Readings from Last 3 Encounters:  08/20/20 69.7 kg (153 lb 9.6 oz)  08/05/20 74.8 kg (164 lb 12.8 oz)  07/22/20 80 kg (176 lb 5.9 oz)    BP 98/60 (BP Location: Right Arm, Patient Position: Sitting)   Pulse (!) 106   Wt 69.7 kg (153 lb 9.6 oz)   SpO2 93%   BMI 24.06 kg/m   Physical Exam: General:  No resp difficulty, arrived in [place], chronically fatigued appearing. HEENT: Normal Neck: Supple. No JVD. Carotids 2+ bilat; no bruits. No lymphadenopathy or thryomegaly appreciated. Cor: PMI nondisplaced. Irregular rate & rhythm. No rubs, gallops or murmurs. Lungs: Clear, diminished in bases Abdomen: Soft, nontender, nondistended. No hepatosplenomegaly. No bruits or masses. Good bowel sounds. Extremities: No cyanosis, clubbing, rash, edema; cool extremities. Neuro: Alert & oriented x 3, cranial nerves grossly intact. Moves all 4 extremities w/o difficulty. Affect pleasant.  ASSESSMENT AND PLAN: 1. Acute on chronic Systolic Heart Failure - H/o mixed ischemic/nonischemic CMP (?HTN playing a role) with EF 20-25% with normal RV on prior echo.  Recent admission 5/22 for CGS in setting of acute STEMI requiring PCI + IABP support. Echo 5/22 showed EF 20-25%, moderately decreased RV function with mild RVE, moderate biatrial enlargement, PASP 34, mild MR, mild TR, IVC normal. Stabilized and weaned off IABP to GDMT. Refused LifeVest at d/c. Recent admission for volume depletion and AKI. Improved w/ IVFs. Scr back to baseline. Today he appears cold and dry, weight down 11 lbs from last visit. NYHA IIIb. - Stop torsemide, spiro and Farxiga. - Continue hydralazine 1.5 tab tid + Imdur 30 mg daily. - Off digoxin w/ elevated level. - Off beta blocker due to low flow state. - BMET & BNP  today.  2. AFlutter: Atypical. Diagnosed previous admit 5/22. S/p TEE/DC-CV on 5/27. Back in AFL, V-rates currently 55s.  - Dig discontinued. - Continue with Eliquis. CBC today. - Continue amio 200 mg bid. - He is in a slower AFL today. - Consider timing of TEE/DCCV with Dr. A[facility]. - He has missed some doses of Eliquis due to vomiting, would need TEE prior to DCCV to rule out LV thrombus.   3. AKI on CKD 4 - Baseline Scr ~ 1.9-2.1. - Labs today. SCr today up to 3.96 today. - Of note, labs on 08/05/20 SCr 2.6. Attempted to call patient however was unable to contact.   4. CAD:   - H/o DES to LCX in 3/19, had occluded PLV at that time. Recently admitted 5/22 with anterior STEMI and occluded mLAD, had DES to mLAD, known CTO PLV with collaterals and patent LCx stent.   - No s/s ischemia. - Continue ASA 81, Plavix + atorva.  - Continue Eliquis.   - BMET from today shows worsening AKI. He appears low-output. Personally discussed with Dr. Aund[facility]. He will need to be admitted to SDU, PICC placed, and initiate milrinone. Would benefit from Palliative consult. Sending patient to ED, no beds available. Rapid Response RN and ED charge RN called.   Allena Katz, FNP-BC 08/20/20  Patient seen with NP, agree with the above note.   See history well-described above.   Patient has been struggling since last discharge from [facility].  He has had nausea, vomiting, dizziness since Sunday and has been unable to keep his meds down.  He has severe fatigue.  SBP 90s in office today.  ECG showed atypical flutter rate around 100.  Weight is trending down though REDS clip elevated at 41%.  Creatinine up to 3.93 from 2.6.   General: Thin, NAD Neck: No JVD, no thyromegaly or thyroid nodule.  Lungs: Clear to auscultation bilaterally with normal respiratory effort. CV: Nondisplaced PMI.  Heart regular S1/S2, no S3/S4, no murmur.  No peripheral edema.  No carotid bruit.  Difficult to palpate pedal pulses.   Abdomen: Soft, nontender, no hepatosplenomegaly, no distention.  Skin: Intact without lesions or rashes.  Neurologic: Alert and oriented x 3.  Psych: Normal affect. Extremities: No clubbing or cyanosis. Cool.  HEENT: Normal.   Patient will need to be admitted due to AKI and concern for low output HF.   1. CAD: H/o DES to LCX in 3/19, had occluded PLV at that time. Admitted with anterior STEMI 5/22 and occluded mLAD, had DES to mLAD, known CTO PLV with collaterals and patent LCx stent.  No chest pain currently.  - Continue Plavix, no ASA with apixaban use.  - Continue atorvastatin 80. 2. AKI on CKD IIIb: Creatinine up to 3.93 today, concern this is due to low output.  - Hold torsemide, spironolactone, [place].  3. 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  Last study in 5/22 was TEE showing EF 20-25%, moderately decreased RV function with mild RVE, moderate biatrial enlargement, PASP 34, mild MR, mild TR.  Cardiogenic shock during 5/22 admission, was eventually able to titrate off IABP and milrinone.  He has struggled since discharge, now with AKI and prominent fatigue, cool extremities.  I am concerned for low output HF.  He does not look volume overloaded on exam and weight is down, though REDS clip 41%.   - Stop torsemide, spironolactone, Farxiga - Off digoxin with recent AKI and high level/toxicity.  - I will place PICC to follow CVP and co-ox (not candidate for HD with [facility]).  - Start milrinone 0.25 mcg/kg/min empirically for now.  - Cut hydralazine back to 12.5 mg tid and Imdur to 15 daily with soft BP.  - Repeat echo.  - Concerning clinical picture with suspected recurrent low output HF.  Would consider LVAD for him but will need improvement in creatinine and will need to make sure that RV can support.     4. COPD: Prior smoker 5. Atrial flutter: Atypical, had DCCV in 5/22  but now back in atrial flutter.  HR 100s.    - Continue amiodarone 200 mg bid.  - Continue apixaban. - Possible TEE-guided DCCV this admission.  Will need TEE as he has missed at least a few doses of apixaban due to nausea.  6. Thrombocytopenia: In [facility], resolved.   Loralie Champagne 08/20/2020 1:29 PM

## 2020-08-20 NOTE — Progress Notes (Addendum)
Pt is on unit no distress. on RA Cold bilateral feet Doppler used for  pedal pulse. Tolerating lying and eating meals. Waiting PICC line placement

## 2020-08-20 NOTE — Progress Notes (Signed)
ReDS Vest / Clip - 08/20/20 0900       ReDS Vest / Clip   Station Marker A    Ruler Value 27.5    ReDS Value Range High volume overload    ReDS Actual Value 41

## 2020-08-20 NOTE — Progress Notes (Signed)
PICC ordered. Sent message to inform RN it will not be placed until 7/21.

## 2020-08-20 NOTE — ED Notes (Signed)
Attempted report x1. 

## 2020-08-21 ENCOUNTER — Inpatient Hospital Stay (HOSPITAL_COMMUNITY): Payer: Medicare Other

## 2020-08-21 ENCOUNTER — Encounter (HOSPITAL_COMMUNITY): Payer: Self-pay | Admitting: Cardiology

## 2020-08-21 DIAGNOSIS — I5022 Chronic systolic (congestive) heart failure: Secondary | ICD-10-CM

## 2020-08-21 DIAGNOSIS — I5043 Acute on chronic combined systolic (congestive) and diastolic (congestive) heart failure: Secondary | ICD-10-CM

## 2020-08-21 DIAGNOSIS — Z515 Encounter for palliative care: Secondary | ICD-10-CM

## 2020-08-21 DIAGNOSIS — Z7189 Other specified counseling: Secondary | ICD-10-CM

## 2020-08-21 DIAGNOSIS — N179 Acute kidney failure, unspecified: Secondary | ICD-10-CM

## 2020-08-21 LAB — BASIC METABOLIC PANEL
Anion gap: 13 (ref 5–15)
BUN: 96 mg/dL — ABNORMAL HIGH (ref 8–23)
CO2: 20 mmol/L — ABNORMAL LOW (ref 22–32)
Calcium: 9 mg/dL (ref 8.9–10.3)
Chloride: 101 mmol/L (ref 98–111)
Creatinine, Ser: 4.34 mg/dL — ABNORMAL HIGH (ref 0.61–1.24)
GFR, Estimated: 14 mL/min — ABNORMAL LOW (ref >60)
Glucose, Bld: 167 mg/dL — ABNORMAL HIGH (ref 70–99)
Potassium: 4.2 mmol/L (ref 3.5–5.1)
Sodium: 134 mmol/L — ABNORMAL LOW (ref 135–145)

## 2020-08-21 LAB — ECHOCARDIOGRAM COMPLETE
Area-P 1/2: 5.02 cm2
Height: 67 in
P 1/2 time: 626 msec
S' Lateral: 4.8 cm
Weight: 2483.26 oz

## 2020-08-21 LAB — GLUCOSE, CAPILLARY
Glucose-Capillary: 164 mg/dL — ABNORMAL HIGH (ref 70–99)
Glucose-Capillary: 265 mg/dL — ABNORMAL HIGH (ref 70–99)
Glucose-Capillary: 248 mg/dL — ABNORMAL HIGH (ref 70–99)
Glucose-Capillary: 153 mg/dL — ABNORMAL HIGH (ref 70–99)
Glucose-Capillary: 51 mg/dL — ABNORMAL LOW (ref 70–99)

## 2020-08-21 LAB — PHOSPHORUS: Phosphorus: 3.4 mg/dL (ref 2.5–4.6)

## 2020-08-21 LAB — MAGNESIUM: Magnesium: 2.8 mg/dL — ABNORMAL HIGH (ref 1.7–2.4)

## 2020-08-21 MED ORDER — MIDODRINE HCL 5 MG PO TABS
5.0000 mg | ORAL_TABLET | Freq: Three times a day (TID) | ORAL | Status: DC
  Administered 2020-08-21 – 2020-08-24 (×10): 5 mg via ORAL
  Filled 2020-08-21 (×10): qty 1

## 2020-08-21 MED ORDER — CHLORHEXIDINE GLUCONATE CLOTH 2 % EX PADS: 6.0000 | MEDICATED_PAD | Freq: Every day | CUTANEOUS | Status: DC

## 2020-08-21 MED ORDER — SODIUM CHLORIDE 0.9 % IV SOLN: INTRAVENOUS | Status: DC

## 2020-08-21 MED ORDER — SODIUM CHLORIDE 0.9% FLUSH: 3.0000 mL | Freq: Two times a day (BID) | INTRAVENOUS | Status: DC

## 2020-08-21 MED ORDER — SODIUM CHLORIDE 0.9% FLUSH
3.0000 mL | Freq: Two times a day (BID) | INTRAVENOUS | Status: DC
  Administered 2020-08-22 – 2020-09-01 (×17): 3 mL via INTRAVENOUS

## 2020-08-21 MED ORDER — SODIUM CHLORIDE 0.9 % IV SOLN: 250.0000 mL | INTRAVENOUS | Status: DC | PRN

## 2020-08-21 MED ORDER — ASPIRIN 81 MG PO CHEW
81.0000 mg | CHEWABLE_TABLET | ORAL | Status: AC
  Administered 2020-08-22: 81 mg via ORAL
  Filled 2020-08-21: qty 1

## 2020-08-21 MED ORDER — SODIUM CHLORIDE 0.9% FLUSH: 3.0000 mL | INTRAVENOUS | Status: DC | PRN

## 2020-08-21 NOTE — Consult Note (Signed)
Consultation Note Date: 08/21/2020   Patient Name: Douglas Villa  DOB: Feb 18, 1950  MRN: [id number]  Age / Sex: 70 y.o., male  PCP: Lillard Anes, MD Referring Physician: Larey Dresser, MD  Reason for Consultation: Establishing goals of care  HPI/Patient Profile: 70 y.o. male  with past medical history of CAD, cardiomyopathy, HTN, and smoking (quit smoking in 2/19, but now back to PPD) with COPD admitted on 08/20/2020 with CAD and AKI on CKD3.   Clinical Assessment and Goals of Care: I have reviewed medical records including EPIC notes, labs and imaging, received report from RN, assessed the patient and met at the bedside to discuss diagnosis prognosis, GOC, EOL wishes, disposition and options.  Douglas Villa is resting quietly in bed in a dark room.  He greets me, making and somewhat keeping eye contact.  He is alert and oriented, able to make his basic needs known. There is no family at bedside at this time.  I introduced Palliative Medicine as specialized medical care for people living with serious illness. It focuses on providing relief from the symptoms and stress of a serious illness. The goal is to improve quality of life for both the patient and the family.  We talk about his acute and current health issues.  We talk about some "what if's and maybe's".  Douglas Villa is able to accurately tell me what has brought him to the hospital and the treatment plan. We talk about the plan for R heart cath 7/21, and possible TEE.  At this point, Douglas Villa is agreeable to any and all procedures.  We talk about his worsening kidney function. I ask him to consider if he would accept HD in the future, if needed.  Douglas Villa tells me that his niece was on HD for years, and he heard her talk about HD.    The natural disease trajectory and expectations at EOL were discussed.  Advanced directives, concepts  specific to code status, were considered and discussed.  We talk about "treat the treatable".  At this point Douglas Villa would accept life support.  I encourage him to consider "for how long?".  We talk about trach/PEG after 2 weeks.    Discussed the importance of continued conversation with family and the medical providers regarding overall plan of care and treatment options, ensuring decisions are within the context of the patient's values and GOCs.  Questions and concerns were addressed.  The patient was encouraged to call with questions or concerns.  PMT will continue to support holistically.  Conference with attending, cardiology, bedside nursing, staff and [facility] team related to patient condition, needs, GOC.    HCPOA   OTHER - Douglas Villa tell me that he has named his former wife, [name] as his health care surrogate. We talk about the importance of sharing his choices with his surrogate.  He shares that he and Douglas Villa share a son.     SUMMARY OF RECOMMENDATIONS   Continue full scope/code Agreeable to [place],  TEE  Time for outcomes PMT to follow    Code Status/Advance Care Planning: Full code - We talk about the concept of "treat the treatable".  I encourage Douglas Villa to consider how long he would want life support.  We talk about trach/PEG after two weeks.   Symptom Management:  Per hospitalist, no additional needs at this time.   Palliative Prophylaxis:  Frequent Pain Assessment and Oral Care  Additional Recommendations (Limitations, Scope, Preferences): Full Scope Treatment  Psycho-social/Spiritual:  Desire for further Chaplaincy support:no Additional Recommendations: Caregiving  Support/Resources  Prognosis:  Unable to determine, based on outcomes. 6 months or less would not be surprising based on chronic cardiac burden.   Discharge Planning:  to be determined, based on outcomes. Anticipate need for [facility].        Primary Diagnoses: Present on Admission:  Acute  systolic heart failure ([place])   I have reviewed the medical record, interviewed the patient and family, and examined the patient. The following aspects are pertinent.  Past Medical History:  Diagnosis Date   Abnormal EKG 03/2009   CAD (coronary artery disease)    CKD (chronic kidney disease)    Heart failure with preserved left ventricular function (HFpEF) (HCC)    Hypertension    OSA (obstructive sleep apnea)    Sexual dysfunction    Type 2 diabetes mellitus with diabetic nephropathy ([place]) 06/18/2020   Social History   Socioeconomic History   Marital status: Married    Spouse name: Not on file   Number of children: Not on file   Years of education: Not on file   Highest education level: Not on file  Occupational History   Not on file  Tobacco Use   Smoking status: Former    Packs/day: 1.00    Years: 45.00    Pack years: 45.00    Types: Cigarettes    Quit date: 2020    Years since quitting: 2.5   Smokeless tobacco: Never  Vaping Use   Vaping Use: Never used  Substance and Sexual Activity   Alcohol use: No   Drug use: No   Sexual activity: Not Currently  Other Topics Concern   Not on file  Social History Narrative   Not on file   Social Determinants of Health   Financial Resource Strain: Medium Risk   Difficulty of Paying Living Expenses: Somewhat hard  Food Insecurity: Food Insecurity Present   Worried About Charity fundraiser in the Last Year: Never true   Ran Out of Food in the Last Year: Sometimes true  Transportation Needs: No Transportation Needs   Lack of Transportation (Medical): No   Lack of Transportation (Non-Medical): No  Physical Activity: Not on file  Stress: Not on file  Social Connections: Not on file   Family History  Problem Relation Age of Onset   Cancer Mother 22   Stroke Father 18   Scheduled Meds:  amiodarone  200 mg Oral BID   atorvastatin  80 mg Oral Daily   clopidogrel  75 mg Oral Daily   midodrine  5 mg Oral TID WC    pantoprazole  40 mg Oral Q1200   sodium chloride flush  3 mL Intravenous Q12H   sodium chloride flush  3 mL Intravenous Q12H   Continuous Infusions:  sodium chloride     sodium chloride 10 mL/hr at 08/21/20 0132   milrinone 0.25 mcg/kg/min (08/21/20 0429)   PRN Meds:.sodium chloride, acetaminophen, sodium chloride flush Medications Prior to Admission:  Prior to Admission medications   Medication Sig Start Date End Date Taking? Authorizing Provider  ACCU-CHEK FASTCLIX LANCETS MISC 1 each by Does not apply route as directed.   Yes [provider]  albuterol (PROVENTIL HFA;VENTOLIN HFA) 108 (90 Base) MCG/ACT inhaler Inhale 2 puffs into the lungs every 6 (six) hours as needed for wheezing or shortness of breath. 08/05/17  Yes Lauraine Rinne, NP  amiodarone (PACERONE) 200 MG tablet Take 1 tablet (200 mg total) by mouth 2 (two) times daily. 08/05/20  Yes [place], Maricela Bo, FNP  apixaban (ELIQUIS) 5 MG TABS tablet Take 1 tablet (5 mg total) by mouth 2 (two) times daily. 06/29/20  Yes Kathyrn Drown D, NP  atorvastatin (LIPITOR) 80 MG tablet Take 1 tablet (80 mg total) by mouth daily. Needs appt for future refills 01/09/20  Yes Larey Dresser, MD  Blood Glucose Monitoring Suppl (ACCU-CHEK AVIVA PLUS) w/Device KIT 1 each by Does not apply route as directed.   Yes [provider]  clopidogrel (PLAVIX) 75 MG tablet TAKE 1 TABLET BY MOUTH ONCE DAILY . APPOINTMENT REQUIRED FOR FUTURE REFILLS Patient taking differently: Take 75 mg by mouth daily. 01/09/20  Yes Larey Dresser, MD  dapagliflozin propanediol (FARXIGA) 10 MG TABS tablet Take 1 tablet (10 mg total) by mouth daily. 06/30/20  Yes Kathyrn Drown D, NP  glucose blood test strip 1 each by Other route as directed.   Yes [provider]  hydrALAZINE (APRESOLINE) 25 MG tablet TAKE 1 & 1/2 (ONE & ONE-HALF) TABLETS BY MOUTH THREE TIMES DAILY. NEED AN APPOINTMENT FOR FUTURE REFILLS Patient taking differently: Take 37.5 mg by mouth 3  (three) times daily. 04/04/20  Yes Larey Dresser, MD  isosorbide mononitrate (IMDUR) 30 MG 24 hr tablet Take 1 tablet (30 mg total) by mouth daily. Needs appt for future refills 01/09/20  Yes Larey Dresser, MD  nitroGLYCERIN (NITROSTAT) 0.4 MG SL tablet Place 0.4 mg under the tongue every 5 (five) minutes as needed for chest pain.   Yes [provider]  pantoprazole (PROTONIX) 40 MG tablet Take 1 tablet (40 mg total) by mouth daily at 12 noon. 07/23/20  Yes Thurnell Lose, MD  potassium chloride SA (KLOR-CON) 20 MEQ tablet Take 1 tablet (20 mEq total) by mouth daily. 06/29/20  Yes Kathyrn Drown D, NP  spironolactone (ALDACTONE) 25 MG tablet Take 1 tablet by mouth once daily Patient taking differently: Take 25 mg by mouth daily. 09/24/19  Yes Larey Dresser, MD  torsemide (DEMADEX) 20 MG tablet Take 1 tablet (20 mg total) by mouth daily. 07/24/20  Yes Thurnell Lose, MD  docusate sodium (COLACE) 100 MG capsule Take 1 capsule (100 mg total) by mouth 2 (two) times daily. Patient not taking: No sig reported 07/23/20   Thurnell Lose, MD  ondansetron (ZOFRAN) 4 MG tablet Take 1 tablet (4 mg total) by mouth every 8 (eight) hours as needed for nausea or vomiting. Patient not taking: No sig reported 08/18/20 08/18/21  Rafael Bihari, FNP   Allergies  Allergen Reactions   Penicillins Other (See Comments)    Sweating    Review of Systems  Unable to perform ROS: Other   Physical Exam Vitals and nursing note reviewed.  Constitutional:      General: He is not in acute distress.    Appearance: Normal appearance. He is not ill-appearing.  HENT:     Head: Normocephalic and atraumatic.  Cardiovascular:     Rate  and Rhythm: Normal rate.  Pulmonary:     Effort: Pulmonary effort is normal. No respiratory distress.  Skin:    General: Skin is warm and dry.  Neurological:     Mental Status: He is alert and oriented to person, place, and time.  Psychiatric:        Mood and Affect:  Mood normal.        Behavior: Behavior normal.    Vital Signs: BP 108/77   Pulse 80   Temp 97.6 F (36.4 C) (Oral)   Resp 20   Ht _0  (1.702 m)   Wt 70.4 kg   SpO2 95%   BMI 24.31 kg/m  Pain Scale: 0-10   Pain Score: 0-No pain   SpO2: SpO2: 95 % O2 Device:SpO2: 95 % O2 Flow Rate: .   IO: Intake/output summary:  Intake/Output Summary (Last 24 hours) at 08/21/2020 1426 Last data filed at 08/21/2020 0500 Gross per 24 hour  Intake 399.2 ml  Output 350 ml  Net 49.2 ml    LBM: Last BM Date: 08/16/20 Baseline Weight: Weight: 69.4 kg Most recent weight: Weight: 70.4 kg     Palliative Assessment/Data:   Flowsheet Rows    Flowsheet Row Most Recent Value  Intake Tab   Referral Department Hospitalist  Unit at Time of Referral Intermediate Care Unit  Palliative Care Primary Diagnosis Cardiac  Date Notified 08/20/20  Palliative Care Type New Palliative care  Reason for referral Clarify Goals of Care  Date of Admission 08/20/20  Date first seen by Palliative Care 08/21/20  # of days Palliative referral response time 1 Day(s)  # of days IP prior to Palliative referral 0  Clinical Assessment   Palliative Performance Scale Score 40%  Pain Max last 24 hours Not able to report  Pain Min Last 24 hours Not able to report  Dyspnea Max Last 24 Hours Not able to report  Dyspnea Min Last 24 hours Not able to report  Psychosocial & Spiritual Assessment   Palliative Care Outcomes        Time In: 0930 Time Out: 1040 Time Total: 70 minutes  Greater than 50%  of this time was spent counseling and coordinating care related to the above assessment and plan.  Signed by: Drue Novel, NP   Please contact Palliative Medicine Team phone at [phone] for questions and concerns.  For individual provider: See Shea Evans

## 2020-08-21 NOTE — H&P (View-Only) (Signed)
Patient ID: Douglas Villa, male   DOB: 1950-03-21, 70 y.o.   MRN: [id number]     Advanced Heart Failure Rounding Note  PCP-Cardiologist: None   Subjective:    Creatinine up to 4.34 this morning.  He was admitted yesterday with malaise/nausea/vomiting/fatigue, concern for low output HF with worsening creatinine.  He was started on milrinone 0.25 empirically and initially felt better, but nauseated again this morning.  SBP 100s. He remains in atrial flutter rate 100s.    Objective:   Weight Range: 70.4 kg Body mass index is 24.31 kg/m.   Vital Signs:   Temp:  [97.5 F (36.4 C)-98.3 F (36.8 C)] 98 F (36.7 C) (07/21 0300) Pulse Rate:  [31-107] 105 (07/21 0300) Resp:  [17-21] 19 (07/21 0300) BP: (101-135)/(75-106) 105/75 (07/21 0300) SpO2:  [95 %-100 %] 95 % (07/21 0300) Weight:  [69.4 kg-70.4 kg] 70.4 kg (07/21 0300) Last BM Date: 08/16/20  Weight change: Filed Weights   08/20/20 1247 08/20/20 1609 08/21/20 0300  Weight: 69.4 kg 70.3 kg 70.4 kg    Intake/Output:   Intake/Output Summary (Last 24 hours) at 08/21/2020 0750 Last data filed at 08/21/2020 0500 Gross per 24 hour  Intake 399.2 ml  Output 350 ml  Net 49.2 ml      Physical Exam    General:  Well appearing. No resp difficulty HEENT: Normal Neck: Supple. JVP difficult, does not appear elevated. Carotids 2+ bilat; no bruits. No lymphadenopathy or thyromegaly appreciated. Cor: PMI lateral. Mildly tachy, regular rate & rhythm. No rubs, gallops or murmurs. Lungs: Clear Abdomen: Soft, nontender, nondistended. No hepatosplenomegaly. No bruits or masses. Good bowel sounds. Extremities: No cyanosis, clubbing, rash, edema Neuro: Alert & orientedx3, cranial nerves grossly intact. moves all 4 extremities w/o difficulty. Affect pleasant   Telemetry   Atrial flutter rate 100s (personally reviewed)  EKG    Atrial flutter (atypical) rate 106 (personally reviewed)  Labs    CBC Recent Labs    08/20/20 1002  08/20/20 1654  WBC 6.9 5.3  HGB 16.3 14.7  HCT 52.0 45.7  MCV 88.9 88.1  PLT 253 400   Basic Metabolic Panel Recent Labs    08/20/20 1002 08/21/20 0538  NA 137 134*  K 4.3 4.2  CL 99 101  CO2 18* 20*  GLUCOSE 105* 167*  BUN 81* 96*  CREATININE 3.93* 4.34*  CALCIUM 9.9 9.0   Liver Function Tests No results for input(s): AST, ALT, ALKPHOS, BILITOT, PROT, ALBUMIN in the last 72 hours. No results for input(s): LIPASE, AMYLASE in the last 72 hours. Cardiac Enzymes No results for input(s): CKTOTAL, CKMB, CKMBINDEX, TROPONINI in the last 72 hours.  BNP: BNP (last 3 results) Recent Labs    07/22/20 0109 07/23/20 0436 08/20/20 1002  BNP 2,071.8* 1,879.9* 338.1*    ProBNP (last 3 results) No results for input(s): PROBNP in the last 8760 hours.   D-Dimer No results for input(s): DDIMER in the last 72 hours. Hemoglobin A1C No results for input(s): HGBA1C in the last 72 hours. Fasting Lipid Panel No results for input(s): CHOL, HDL, LDLCALC, TRIG, CHOLHDL, LDLDIRECT in the last 72 hours. Thyroid Function Tests No results for input(s): TSH, T4TOTAL, T3FREE, THYROIDAB in the last 72 hours.  Invalid input(s): FREET3  Other results:   Imaging    [place] EKG SITE RITE  Result Date: 08/20/2020 If Site Rite image not attached, placement could not be confirmed due to current cardiac rhythm.    Medications:     Scheduled Medications:  amiodarone  200 mg Oral BID   apixaban  5 mg Oral BID   atorvastatin  80 mg Oral Daily   clopidogrel  75 mg Oral Daily   pantoprazole  40 mg Oral Q1200   sodium chloride flush  3 mL Intravenous Q12H   sodium chloride flush  3 mL Intravenous Q12H    Infusions:  sodium chloride     sodium chloride 10 mL/hr at 08/21/20 0132   milrinone 0.25 mcg/kg/min (08/21/20 0429)    PRN Medications: sodium chloride, acetaminophen, sodium chloride flush    Assessment/Plan   1. CAD: H/o DES to LCX in 3/19, had occluded PLV at that time.  Admitted with anterior STEMI 5/22 and occluded mLAD, had DES to mLAD, known CTO PLV with collaterals and patent LCx stent.  No chest pain currently.  - Continue Plavix, no ASA with apixaban use.  - Continue atorvastatin 80. 2. AKI on CKD IIIb: Creatinine 3.93 => 4.34 today, concern this is due to low output. - Holding torsemide, spironolactone, Farxiga. - Empiric milrinone begun, but creatinine still heading.  - Hold hydralazine/Imdur.  - Add midodrine 5 mg tid to maintain MAP.  - Will consult nephrology. I am concerned about his trajectory.  3. 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  Last study in 5/22 was TEE showing EF 20-25%, moderately decreased RV function with mild RVE, moderate biatrial enlargement, PASP 34, mild MR, mild TR.  Cardiogenic shock during 5/22 admission, was eventually able to titrate off IABP and milrinone.  He has struggled since discharge, now with AKI and prominent fatigue, cool extremities.  I am concerned for low output HF.  He does not look volume overloaded on exam and weight is down over the last few weeks, though REDS clip in office was 41%.   - Stopped torsemide, spironolactone, Farxiga.  - With relatively soft BP, will stop hydralazine/Imdur and add midodrine 5 mg tid.  - Off digoxin with recent AKI and high level/toxicity. - Started milrinone 0.25 mcg/kg/min empirically last night.  - No diuretic for now, not markedly volume overloaded and creatinine rising.  Will assess filling pressures by RHC.  - Hold Eliquis this evening and will plan RHC tomorrow morning.  - Repeat echo. - Concerning clinical picture with suspected recurrent low output HF.  Not LVAD candidate at this time with AKI.  4. COPD: Prior smoker 5. Atrial flutter: Atypical, had DCCV in 5/22 but now back in atrial flutter.  HR 100s.    - Continue amiodarone 200 mg bid.  - Continue  apixaban. - Possible TEE-guided DCCV this admission.  Will need TEE as he has missed at least a few doses of apixaban due to nausea.   Length of Stay: 1  Loralie Champagne, MD  08/21/2020, 7:50 AM  Advanced Heart Failure Team Pager [phone] (M-F; 7a - 5p)  Please contact [facility] for night-coverage after hours (5p -7a ) and weekends on [email or web address]

## 2020-08-21 NOTE — Progress Notes (Signed)
  Echocardiogram 2D Echocardiogram has been performed.  Douglas Villa 08/21/2020, 9:45 AM

## 2020-08-21 NOTE — Progress Notes (Addendum)
Inpatient Diabetes Program Recommendations  AACE/ADA: New Consensus Statement on Inpatient Glycemic Control (2015)  Target Ranges:  Prepandial:   less than 140 mg/dL      Peak postprandial:   less than 180 mg/dL (1-2 hours)      Critically ill patients:  140 - 180 mg/dL   Lab Results  Component Value Date   GLUCAP 265 (H) 08/21/2020   HGBA1C 7.7 (H) 06/21/2020  Results for CASSIUS, CULLINANE (MRN [id number]) as of 08/21/2020 15:40  Ref. Range 07/23/2020 12:18 08/20/2020 16:26 08/20/2020 21:34 08/21/2020 06:19 08/21/2020 11:21  Glucose-Capillary Latest Ref Range: 70 - 99 mg/dL 141 (H) 111 (H) 331 (H) 164 (H) 265 (H)     Review of Glycemic Control  Diabetes history: type 2 Outpatient Diabetes medications: Farxiga 10 mg daily Current orders for Inpatient glycemic control: none  Inpatient Diabetes Program Recommendations:   Noted that blood sugars have been greater than 180 mg/dl. Recommend starting Novolog 0-6 units (very sensitive) correction scale TID & Novolog 0-5 units at HS if blood sugars continue to be elevated.   Do not recommend restarting Farxiga after discharge due to CKD.   Harvel Ricks RN BSN CDE Diabetes Coordinator Pager: [phone]  8am-5pm

## 2020-08-21 NOTE — Progress Notes (Signed)
Patient ID: Douglas Villa, male   DOB: October 30, 1950, 70 y.o.   MRN: [id number]     Advanced Heart Failure Rounding Note  PCP-Cardiologist: None   Subjective:    Creatinine up to 4.34 this morning.  He was admitted yesterday with malaise/nausea/vomiting/fatigue, concern for low output HF with worsening creatinine.  He was started on milrinone 0.25 empirically and initially felt better, but nauseated again this morning.  SBP 100s. He remains in atrial flutter rate 100s.    Objective:   Weight Range: 70.4 kg Body mass index is 24.31 kg/m.   Vital Signs:   Temp:  [97.5 F (36.4 C)-98.3 F (36.8 C)] 98 F (36.7 C) (07/21 0300) Pulse Rate:  [31-107] 105 (07/21 0300) Resp:  [17-21] 19 (07/21 0300) BP: (101-135)/(75-106) 105/75 (07/21 0300) SpO2:  [95 %-100 %] 95 % (07/21 0300) Weight:  [69.4 kg-70.4 kg] 70.4 kg (07/21 0300) Last BM Date: 08/16/20  Weight change: Filed Weights   08/20/20 1247 08/20/20 1609 08/21/20 0300  Weight: 69.4 kg 70.3 kg 70.4 kg    Intake/Output:   Intake/Output Summary (Last 24 hours) at 08/21/2020 0750 Last data filed at 08/21/2020 0500 Gross per 24 hour  Intake 399.2 ml  Output 350 ml  Net 49.2 ml      Physical Exam    General:  Well appearing. No resp difficulty HEENT: Normal Neck: Supple. JVP difficult, does not appear elevated. Carotids 2+ bilat; no bruits. No lymphadenopathy or thyromegaly appreciated. Cor: PMI lateral. Mildly tachy, regular rate & rhythm. No rubs, gallops or murmurs. Lungs: Clear Abdomen: Soft, nontender, nondistended. No hepatosplenomegaly. No bruits or masses. Good bowel sounds. Extremities: No cyanosis, clubbing, rash, edema Neuro: Alert & orientedx3, cranial nerves grossly intact. moves all 4 extremities w/o difficulty. Affect pleasant   Telemetry   Atrial flutter rate 100s (personally reviewed)  EKG    Atrial flutter (atypical) rate 106 (personally reviewed)  Labs    CBC Recent Labs    08/20/20 1002  08/20/20 1654  WBC 6.9 5.3  HGB 16.3 14.7  HCT 52.0 45.7  MCV 88.9 88.1  PLT 253 024   Basic Metabolic Panel Recent Labs    08/20/20 1002 08/21/20 0538  NA 137 134*  K 4.3 4.2  CL 99 101  CO2 18* 20*  GLUCOSE 105* 167*  BUN 81* 96*  CREATININE 3.93* 4.34*  CALCIUM 9.9 9.0   Liver Function Tests No results for input(s): AST, ALT, ALKPHOS, BILITOT, PROT, ALBUMIN in the last 72 hours. No results for input(s): LIPASE, AMYLASE in the last 72 hours. Cardiac Enzymes No results for input(s): CKTOTAL, CKMB, CKMBINDEX, TROPONINI in the last 72 hours.  BNP: BNP (last 3 results) Recent Labs    07/22/20 0109 07/23/20 0436 08/20/20 1002  BNP 2,071.8* 1,879.9* 338.1*    ProBNP (last 3 results) No results for input(s): PROBNP in the last 8760 hours.   D-Dimer No results for input(s): DDIMER in the last 72 hours. Hemoglobin A1C No results for input(s): HGBA1C in the last 72 hours. Fasting Lipid Panel No results for input(s): CHOL, HDL, LDLCALC, TRIG, CHOLHDL, LDLDIRECT in the last 72 hours. Thyroid Function Tests No results for input(s): TSH, T4TOTAL, T3FREE, THYROIDAB in the last 72 hours.  Invalid input(s): FREET3  Other results:   Imaging    [place] EKG SITE RITE  Result Date: 08/20/2020 If Site Rite image not attached, placement could not be confirmed due to current cardiac rhythm.    Medications:     Scheduled Medications:  amiodarone  200 mg Oral BID   apixaban  5 mg Oral BID   atorvastatin  80 mg Oral Daily   clopidogrel  75 mg Oral Daily   pantoprazole  40 mg Oral Q1200   sodium chloride flush  3 mL Intravenous Q12H   sodium chloride flush  3 mL Intravenous Q12H    Infusions:  sodium chloride     sodium chloride 10 mL/hr at 08/21/20 0132   milrinone 0.25 mcg/kg/min (08/21/20 0429)    PRN Medications: sodium chloride, acetaminophen, sodium chloride flush    Assessment/Plan   1. CAD: H/o DES to LCX in 3/19, had occluded PLV at that time.  Admitted with anterior STEMI 5/22 and occluded mLAD, had DES to mLAD, known CTO PLV with collaterals and patent LCx stent.  No chest pain currently.  - Continue Plavix, no ASA with apixaban use.  - Continue atorvastatin 80. 2. AKI on CKD IIIb: Creatinine 3.93 => 4.34 today, concern this is due to low output. - Holding torsemide, spironolactone, Farxiga. - Empiric milrinone begun, but creatinine still heading.  - Hold hydralazine/Imdur.  - Add midodrine 5 mg tid to maintain MAP.  - Will consult nephrology. I am concerned about his trajectory.  3. 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  Last study in 5/22 was TEE showing EF 20-25%, moderately decreased RV function with mild RVE, moderate biatrial enlargement, PASP 34, mild MR, mild TR.  Cardiogenic shock during 5/22 admission, was eventually able to titrate off IABP and milrinone.  He has struggled since discharge, now with AKI and prominent fatigue, cool extremities.  I am concerned for low output HF.  He does not look volume overloaded on exam and weight is down over the last few weeks, though REDS clip in office was 41%.   - Stopped torsemide, spironolactone, Farxiga.  - With relatively soft BP, will stop hydralazine/Imdur and add midodrine 5 mg tid.  - Off digoxin with recent AKI and high level/toxicity. - Started milrinone 0.25 mcg/kg/min empirically last night.  - No diuretic for now, not markedly volume overloaded and creatinine rising.  Will assess filling pressures by RHC.  - Hold Eliquis this evening and will plan RHC tomorrow morning.  - Repeat echo. - Concerning clinical picture with suspected recurrent low output HF.  Not LVAD candidate at this time with AKI.  4. COPD: Prior smoker 5. Atrial flutter: Atypical, had DCCV in 5/22 but now back in atrial flutter.  HR 100s.    - Continue amiodarone 200 mg bid.  - Continue  apixaban. - Possible TEE-guided DCCV this admission.  Will need TEE as he has missed at least a few doses of apixaban due to nausea.   Length of Stay: 1  Loralie Champagne, MD  08/21/2020, 7:50 AM  Advanced Heart Failure Team Pager [phone] (M-F; 7a - 5p)  Please contact [facility] for night-coverage after hours (5p -7a ) and weekends on [email or web address]

## 2020-08-21 NOTE — Consult Note (Signed)
[facility] Renal Consultation Note  Requesting MD: Dr. Aundra Dubin Indication for Consultation: AKI on CKD 3b  HPI:  Douglas Villa is a 70 y.o. male with a history of CAD, cardiomyopathy, HTN, paroxysmal A. fib, COPD, CKD 3b and T2DM who was admitted for acute on chronic systolic heart failure. Patient reports that for the past few days, he has felt sick to his stomach with associated dizziness. He continues to feel fatigued at rest, occasional blurry vision and has noticed bilateral hand tremors for the last 2 to 3 months. He denies any shortness of breath, chest pain, back pain, dysuria, hematuria, fever/chills, itching or myalgia.   Patient was admitted under advanced heart failure team with concern for low output heart failure. Found to have BUN/creatinine of 81/3.93 on admission. His torsemide, spironolactone and [place] was held. Patient was started on empirical milrinone and started on midodrine 5 mg 3 times daily to maintain MAP. BUN/creatinine still trending up today at 96/4.34. Nephrology was consulted today for assistance with progressive renal failure.  On evaluation, patient remains nauseous but has not had any vomiting today.  He continues to deny any chest pain, shortness of breath or leg pain. Still endorse bilateral hand tremors. Reports she was referred to a nephrologist by Dr. Algernon Huxley but has not seen her in the past 2 to 3 years.  Creatinine, Ser  Date/Time Value Ref Range Status  08/21/2020 05:38 AM 4.34 (H) 0.61 - 1.24 mg/dL Final  08/20/2020 10:02 AM 3.93 (H) 0.61 - 1.24 mg/dL Final  08/05/2020 04:42 PM 2.60 (H) 0.61 - 1.24 mg/dL Final  07/23/2020 04:36 AM 1.80 (H) 0.61 - 1.24 mg/dL Final  07/22/2020 01:09 AM 1.86 (H) 0.61 - 1.24 mg/dL Final  07/21/2020 07:06 AM 1.62 (H) 0.61 - 1.24 mg/dL Final  07/20/2020 01:09 AM 2.20 (H) 0.61 - 1.24 mg/dL Final  07/19/2020 02:08 AM 3.14 (H) 0.61 - 1.24 mg/dL Final  07/18/2020 12:34 AM 4.54 (H) 0.61 - 1.24 mg/dL Final   07/17/2020 09:17 PM 5.00 (H) 0.61 - 1.24 mg/dL Final  07/17/2020 12:38 PM 5.47 (H) 0.61 - 1.24 mg/dL Final  06/29/2020 04:53 AM 1.94 (H) 0.61 - 1.24 mg/dL Final  06/28/2020 05:00 AM 1.96 (H) 0.61 - 1.24 mg/dL Final  06/27/2020 05:00 AM 1.96 (H) 0.61 - 1.24 mg/dL Final  06/26/2020 04:54 AM 1.52 (H) 0.61 - 1.24 mg/dL Final  06/25/2020 04:08 AM 1.55 (H) 0.61 - 1.24 mg/dL Final  06/24/2020 03:32 AM 1.73 (H) 0.61 - 1.24 mg/dL Final  06/23/2020 03:56 AM 1.99 (H) 0.61 - 1.24 mg/dL Final  06/22/2020 05:05 AM 1.89 (H) 0.61 - 1.24 mg/dL Final  06/21/2020 03:52 AM 2.06 (H) 0.61 - 1.24 mg/dL Final  06/20/2020 10:49 PM 2.16 (H) 0.61 - 1.24 mg/dL Final  06/20/2020 06:15 PM 2.27 (H) 0.61 - 1.24 mg/dL Final  06/20/2020 01:35 PM 2.25 (H) 0.61 - 1.24 mg/dL Final  06/20/2020 11:47 AM 2.10 (H) 0.61 - 1.24 mg/dL Final  02/09/2018 02:59 PM 2.35 (H) 0.61 - 1.24 mg/dL Final  01/30/2018 11:58 AM 1.81 (H) 0.61 - 1.24 mg/dL Final  11/18/2017 12:19 PM 1.92 (H) 0.61 - 1.24 mg/dL Final  11/07/2017 11:49 AM 2.03 (H) 0.61 - 1.24 mg/dL Final  10/28/2017 11:33 AM 1.82 (H) 0.61 - 1.24 mg/dL Final  10/19/2017 11:10 AM 2.51 (H) 0.61 - 1.24 mg/dL Final  10/07/2017 09:59 AM 2.09 (H) 0.61 - 1.24 mg/dL Final  09/19/2017 11:32 AM 2.11 (H) 0.61 - 1.24 mg/dL Final  09/12/2017 11:42 AM 1.76 (H)  0.61 - 1.24 mg/dL Final  09/02/2017 11:27 AM 1.85 (H) 0.61 - 1.24 mg/dL Final  08/19/2017 08:48 AM 2.00 (H) 0.76 - 1.27 mg/dL Final  07/07/2017 02:05 PM 2.06 (H) 0.76 - 1.27 mg/dL Final     PMHx:   Past Medical History:  Diagnosis Date   Abnormal EKG 03/2009   CAD (coronary artery disease)    CKD (chronic kidney disease)    Heart failure with preserved left ventricular function (HFpEF) (HCC)    Hypertension    OSA (obstructive sleep apnea)    Sexual dysfunction    Type 2 diabetes mellitus with diabetic nephropathy ([place]) 06/18/2020    Past Surgical History:  Procedure Laterality Date   APPENDECTOMY  1969   ARTERIAL LINE  INSERTION N/A 06/20/2020   Procedure: ARTERIAL LINE INSERTION;  Surgeon: Troy Sine, MD;  Location: [place] [facility];  Service: Cardiovascular;  Laterality: N/A;   CARDIAC CATHETERIZATION     CARDIOVERSION N/A 06/27/2020   Procedure: CARDIOVERSION;  Surgeon: Larey Dresser, MD;  Location: [facility] ENDOSCOPY;  Service: Cardiovascular;  Laterality: N/A;   CENTRAL LINE INSERTION  06/20/2020   Procedure: CENTRAL LINE INSERTION;  Surgeon: Troy Sine, MD;  Location: [place] [facility];  Service: Cardiovascular;;   CORONARY ANGIOPLASTY     CORONARY/GRAFT ACUTE MI REVASCULARIZATION N/A 06/20/2020   Procedure: Coronary/Graft Acute MI Revascularization;  Surgeon: Troy Sine, MD;  Location: [place] [facility];  Service: Cardiovascular;  Laterality: N/A;   IABP INSERTION N/A 06/20/2020   Procedure: IABP Insertion;  Surgeon: Troy Sine, MD;  Location: [place] [facility];  Service: Cardiovascular;  Laterality: N/A;   RIGHT/LEFT HEART CATH AND CORONARY ANGIOGRAPHY N/A 06/20/2020   Procedure: RIGHT/LEFT HEART CATH AND CORONARY ANGIOGRAPHY;  Surgeon: Troy Sine, MD;  Location: [place] [facility];  Service: Cardiovascular;  Laterality: N/A;   TEE WITHOUT CARDIOVERSION N/A 06/27/2020   Procedure: TRANSESOPHAGEAL ECHOCARDIOGRAM (TEE);  Surgeon: Larey Dresser, MD;  Location: [facility];  Service: Cardiovascular;  Laterality: N/A;    Family Hx:  Family History  Problem Relation Age of Onset   Cancer Mother 1   Stroke Father 69    Social History:  reports that he quit smoking about 2 years ago. His smoking use included cigarettes. He has a 45.00 pack-year smoking history. He has never used smokeless tobacco. He reports that he does not drink alcohol and does not use drugs.  Allergies:  Allergies  Allergen Reactions   Penicillins Other (See Comments)    Sweating     Medications: Prior to Admission medications   Medication Sig Start Date End Date Taking? Authorizing Provider   ACCU-CHEK FASTCLIX LANCETS MISC 1 each by Does not apply route as directed.   Yes [provider]  albuterol (PROVENTIL HFA;VENTOLIN HFA) 108 (90 Base) MCG/ACT inhaler Inhale 2 puffs into the lungs every 6 (six) hours as needed for wheezing or shortness of breath. 08/05/17  Yes Lauraine Rinne, NP  amiodarone (PACERONE) 200 MG tablet Take 1 tablet (200 mg total) by mouth 2 (two) times daily. 08/05/20  Yes [place], Maricela Bo, FNP  apixaban (ELIQUIS) 5 MG TABS tablet Take 1 tablet (5 mg total) by mouth 2 (two) times daily. 06/29/20  Yes Kathyrn Drown D, NP  atorvastatin (LIPITOR) 80 MG tablet Take 1 tablet (80 mg total) by mouth daily. Needs appt for future refills 01/09/20  Yes Larey Dresser, MD  Blood Glucose Monitoring Suppl (ACCU-CHEK AVIVA PLUS) w/Device KIT  1 each by Does not apply route as directed.   Yes [provider]  clopidogrel (PLAVIX) 75 MG tablet TAKE 1 TABLET BY MOUTH ONCE DAILY . APPOINTMENT REQUIRED FOR FUTURE REFILLS Patient taking differently: Take 75 mg by mouth daily. 01/09/20  Yes Larey Dresser, MD  dapagliflozin propanediol (FARXIGA) 10 MG TABS tablet Take 1 tablet (10 mg total) by mouth daily. 06/30/20  Yes Kathyrn Drown D, NP  glucose blood test strip 1 each by Other route as directed.   Yes [provider]  hydrALAZINE (APRESOLINE) 25 MG tablet TAKE 1 & 1/2 (ONE & ONE-HALF) TABLETS BY MOUTH THREE TIMES DAILY. NEED AN APPOINTMENT FOR FUTURE REFILLS Patient taking differently: Take 37.5 mg by mouth 3 (three) times daily. 04/04/20  Yes Larey Dresser, MD  isosorbide mononitrate (IMDUR) 30 MG 24 hr tablet Take 1 tablet (30 mg total) by mouth daily. Needs appt for future refills 01/09/20  Yes Larey Dresser, MD  nitroGLYCERIN (NITROSTAT) 0.4 MG SL tablet Place 0.4 mg under the tongue every 5 (five) minutes as needed for chest pain.   Yes [provider]  pantoprazole (PROTONIX) 40 MG tablet Take 1 tablet (40 mg total) by mouth daily at 12 noon.  07/23/20  Yes Thurnell Lose, MD  potassium chloride SA (KLOR-CON) 20 MEQ tablet Take 1 tablet (20 mEq total) by mouth daily. 06/29/20  Yes Kathyrn Drown D, NP  spironolactone (ALDACTONE) 25 MG tablet Take 1 tablet by mouth once daily Patient taking differently: Take 25 mg by mouth daily. 09/24/19  Yes Larey Dresser, MD  torsemide (DEMADEX) 20 MG tablet Take 1 tablet (20 mg total) by mouth daily. 07/24/20  Yes Thurnell Lose, MD  docusate sodium (COLACE) 100 MG capsule Take 1 capsule (100 mg total) by mouth 2 (two) times daily. Patient not taking: No sig reported 07/23/20   Thurnell Lose, MD  ondansetron (ZOFRAN) 4 MG tablet Take 1 tablet (4 mg total) by mouth every 8 (eight) hours as needed for nausea or vomiting. Patient not taking: No sig reported 08/18/20 08/18/21  Rafael Bihari, FNP    I have reviewed the patient's current medications.  Labs:  Results for orders placed or performed during the hospital encounter of 08/20/20 (from the past 48 hour(s))  Resp Panel by RT-PCR (Flu A&B, Covid) Nasopharyngeal Swab     Status: None   Collection Time: 08/20/20  1:03 PM   Specimen: Nasopharyngeal Swab; Nasopharyngeal(NP) swabs in vial transport medium  Result Value Ref Range   SARS Coronavirus 2 by RT PCR NEGATIVE NEGATIVE    Comment: (NOTE) SARS-CoV-2 target nucleic acids are NOT DETECTED.  The SARS-CoV-2 RNA is generally detectable in upper respiratory specimens during the acute phase of infection. The lowest concentration of SARS-CoV-2 viral copies this assay can detect is 138 copies/mL. A negative result does not preclude SARS-Cov-2 infection and should not be used as the sole basis for treatment or other patient management decisions. A negative result may occur with  improper specimen collection/handling, submission of specimen other than nasopharyngeal swab, presence of viral mutation(s) within the areas targeted by this assay, and inadequate number of viral copies(<138  copies/mL). A negative result must be combined with clinical observations, patient history, and epidemiological information. The expected result is Negative.  Fact Sheet for Patients:  EntrepreneurPulse.com.au  Fact Sheet for Healthcare Providers:  IncredibleEmployment.be  This test is no t yet approved or cleared by the [place] FDA and  has been  authorized for detection and/or diagnosis of SARS-CoV-2 by FDA under an Emergency Use Authorization (EUA). This EUA will remain  in effect (meaning this test can be used) for the duration of the COVID-19 declaration under Section 564(b)(1) of the Act, 21 U.S.C.section 360bbb-3(b)(1), unless the authorization is terminated  or revoked sooner.       Influenza A by PCR NEGATIVE NEGATIVE   Influenza B by PCR NEGATIVE NEGATIVE    Comment: (NOTE) The Xpert Xpress SARS-CoV-2/FLU/RSV plus assay is intended as an aid in the diagnosis of influenza from Nasopharyngeal swab specimens and should not be used as a sole basis for treatment. Nasal washings and aspirates are unacceptable for Xpert Xpress SARS-CoV-2/FLU/RSV testing.  Fact Sheet for Patients: BloggerCourse.com  Fact Sheet for Healthcare Providers: SeriousBroker.it  This test is not yet approved or cleared by the [place] FDA and has been authorized for detection and/or diagnosis of SARS-CoV-2 by FDA under an Emergency Use Authorization (EUA). This EUA will remain in effect (meaning this test can be used) for the duration of the COVID-19 declaration under Section 564(b)(1) of the Act, 21 U.S.C. section 360bbb-3(b)(1), unless the authorization is terminated or revoked.  Performed at [facility], [address], [place], [place] [address]   Glucose, capillary     Status: Abnormal   Collection Time: 08/20/20  4:26 PM  Result Value Ref Range   Glucose-Capillary 111 (H) 70 - 99 mg/dL     Comment: Glucose reference range applies only to samples taken after fasting for at least 8 hours.  CBC     Status: Abnormal   Collection Time: 08/20/20  4:54 PM  Result Value Ref Range   WBC 5.3 4.0 - 10.5 K/uL   RBC 5.19 4.22 - 5.81 MIL/uL   Hemoglobin 14.7 13.0 - 17.0 g/dL   HCT 99.2 57.8 - 78.9 %   MCV 88.1 80.0 - 100.0 fL   MCH 28.3 26.0 - 34.0 pg   MCHC 32.2 30.0 - 36.0 g/dL   RDW 70.1 (H) 40.3 - 74.2 %   Platelets 245 150 - 400 K/uL   nRBC 0.0 0.0 - 0.2 %    Comment: Performed at [facility], [address] [place], [place] [address]  Glucose, capillary     Status: Abnormal   Collection Time: 08/20/20  9:34 PM  Result Value Ref Range   Glucose-Capillary 331 (H) 70 - 99 mg/dL    Comment: Glucose reference range applies only to samples taken after fasting for at least 8 hours.  Basic metabolic panel     Status: Abnormal   Collection Time: 08/21/20  5:38 AM  Result Value Ref Range   Sodium 134 (L) 135 - 145 mmol/L   Potassium 4.2 3.5 - 5.1 mmol/L   Chloride 101 98 - 111 mmol/L   CO2 20 (L) 22 - 32 mmol/L   Glucose, Bld 167 (H) 70 - 99 mg/dL    Comment: Glucose reference range applies only to samples taken after fasting for at least 8 hours.   BUN 96 (H) 8 - 23 mg/dL   Creatinine, Ser 8.91 (H) 0.61 - 1.24 mg/dL   Calcium 9.0 8.9 - 81.2 mg/dL   GFR, Estimated 14 (L) >60 mL/min    Comment: (NOTE) Calculated using the CKD-EPI Creatinine Equation (2021)    Anion gap 13 5 - 15    Comment: Performed at [facility], [address], [place], [place] [address]  Glucose, capillary  Status: Abnormal   Collection Time: 08/21/20  6:19 AM  Result Value Ref Range   Glucose-Capillary 164 (H) 70 - 99 mg/dL    Comment: Glucose reference range applies only to samples taken after fasting for at least 8 hours.  Glucose, capillary     Status: Abnormal   Collection Time: 08/21/20 11:21 AM  Result Value Ref Range   Glucose-Capillary 265 (H) 70 - 99 mg/dL    Comment:  Glucose reference range applies only to samples taken after fasting for at least 8 hours.     ROS:  Pertinent items are noted in HPI.  Physical Exam: Vitals:   08/21/20 0810 08/21/20 1000  BP:  108/77  Pulse:  80  Resp: 18 20  Temp:    SpO2:  95%   General: Pleasant, well-appearing man laying in bed. No acute distress. Head: Normocephalic. Atraumatic. CV: RRR. No murmurs, rubs, or gallops. No LE edema Pulmonary: Lungs CTAB. Normal effort. No wheezing or rales. Abdominal: Soft, nontender, nondistended. Normal bowel sounds. Extremities: Palpable radial and DP pulses. Normal ROM. Mild asterixis. Cool extremities. Skin: Warm and dry. No obvious rash or lesions. Neuro: A&Ox3. Moves all extremities. Normal sensation. No focal deficit. Psych: Normal mood and affect  Douglas Villa is a 70 y.o. male with a history of CAD, cardiomyopathy, HTN, paroxysmal A. fib, COPD, CKD 3b and T2DM who was admitted for acute on chronic systolic heart failure.  Nephrology consulted for acute on chronic renal failure.  Assessment/Plan: AKI on CKD 3 stage IIIb - Patient with a history of CKD since 2019. Baseline creatinine around 2.0. Found to have elevated BUN/sCr of 81/3.93 on admission, worse today at 96/4.34. Recent renal US negative for hydronephrosis but showed some simple cyst and small kidney size. Likely secondary to hypoperfusion from low output heart failure and hypotension. He likely has some element of diabetic nephropathy with his history of T2DM. Currently with progressive uremic symptoms. Patient euvolemic to slightly dry on exam, mild asterixis. He will benefit from short-term dialysis to help improve uremic symptoms. We will hold off until RHC to assess volume status.  -- Pending UA and MA/CR to assess for proteinuria  -- Continue Milrinone to help with forward flow  -- Consider temp TDC for short term HD s/p RHC  -- Agree with holding spironolactone, torsemide and Farxiga for now  --  Trend BMP, electrolyte  -- Strict I&O, daily weight 2. Hypertension/volume - Patient found to have a borderline low BP on admission.  Started on midodrine with some improvement, has BP in the 100s to 120s.   -- Continue holding antihypertensives as above  -- Continue midodrine 5 mg 3 times daily, increase to 10 mg if BP remains soft tomorrow. 4. Anemia (IDA) -iron studies 2 months ago showed iron 22, iron sat 8%, ferritin 42.  Hemoglobin normal at 14.7 on admission.  5.  Acute on chronic systolic HF: Thought to be due to low output HF.  Plan for [facility] tomorrow morning.  Management per cardiology 6.  CAD s/p DES to Lcx in 3/19 -continue atorvastatin. Management per cardiology. 7.  A. Fib on Eliquis: On amiodarone for rate control. Remains in A. Fib with HR in the 70s to 80s.  Management per cardiology. 8.  T2DM: recent A1c of 7.4.  CBGs in the 200s.  Consider starting patient on SSI.    [name] 08/21/2020, 1:18 PM

## 2020-08-22 ENCOUNTER — Inpatient Hospital Stay (HOSPITAL_COMMUNITY): Payer: Medicare Other

## 2020-08-22 ENCOUNTER — Encounter (HOSPITAL_COMMUNITY): Payer: Self-pay | Admitting: Cardiology

## 2020-08-22 ENCOUNTER — Encounter (HOSPITAL_COMMUNITY)
Admission: EM | Disposition: A | Discharge status: Home Health | Payer: Self-pay | Source: Ambulatory Visit | Attending: Cardiology

## 2020-08-22 DIAGNOSIS — I5021 Acute systolic (congestive) heart failure: Secondary | ICD-10-CM

## 2020-08-22 HISTORY — PX: IR FLUORO GUIDE CV LINE RIGHT: IMG2283

## 2020-08-22 HISTORY — PX: RIGHT HEART CATH: CATH118263

## 2020-08-22 LAB — POCT I-STAT EG7
Acid-base deficit: 1 mmol/L (ref 0.0–2.0)
Acid-base deficit: 2 mmol/L (ref 0.0–2.0)
Bicarbonate: 23.3 mmol/L (ref 20.0–28.0)
Bicarbonate: 24.6 mmol/L (ref 20.0–28.0)
Calcium, Ion: 1.05 mmol/L — ABNORMAL LOW (ref 1.15–1.40)
Calcium, Ion: 1.18 mmol/L (ref 1.15–1.40)
HCT: 35 % — ABNORMAL LOW (ref 39.0–52.0)
HCT: 35 % — ABNORMAL LOW (ref 39.0–52.0)
Hemoglobin: 11.9 g/dL — ABNORMAL LOW (ref 13.0–17.0)
Hemoglobin: 11.9 g/dL — ABNORMAL LOW (ref 13.0–17.0)
O2 Saturation: 65 %
O2 Saturation: 67 %
Potassium: 3.4 mmol/L — ABNORMAL LOW (ref 3.5–5.1)
Potassium: 3.7 mmol/L (ref 3.5–5.1)
Sodium: 137 mmol/L (ref 135–145)
Sodium: 140 mmol/L (ref 135–145)
TCO2: 25 mmol/L (ref 22–32)
TCO2: 26 mmol/L (ref 22–32)
pCO2, Ven: 39.9 mmHg — ABNORMAL LOW (ref 44.0–60.0)
pCO2, Ven: 41.6 mmHg — ABNORMAL LOW (ref 44.0–60.0)
pH, Ven: 7.375 (ref 7.250–7.430)
pH, Ven: 7.379 (ref 7.250–7.430)
pO2, Ven: 35 mmHg (ref 32.0–45.0)
pO2, Ven: 35 mmHg (ref 32.0–45.0)

## 2020-08-22 LAB — CBC
HCT: 36.4 % — ABNORMAL LOW (ref 39.0–52.0)
Hemoglobin: 12.5 g/dL — ABNORMAL LOW (ref 13.0–17.0)
MCH: 28.5 pg (ref 26.0–34.0)
MCHC: 34.3 g/dL (ref 30.0–36.0)
MCV: 82.9 fL (ref 80.0–100.0)
Platelets: 192 10*3/uL (ref 150–400)
RBC: 4.39 MIL/uL (ref 4.22–5.81)
RDW: 17.3 % — ABNORMAL HIGH (ref 11.5–15.5)
WBC: 5.1 10*3/uL (ref 4.0–10.5)
nRBC: 0 % (ref 0.0–0.2)

## 2020-08-22 LAB — BASIC METABOLIC PANEL
Anion gap: 9 (ref 5–15)
BUN: 84 mg/dL — ABNORMAL HIGH (ref 8–23)
CO2: 22 mmol/L (ref 22–32)
Calcium: 8.7 mg/dL — ABNORMAL LOW (ref 8.9–10.3)
Chloride: 102 mmol/L (ref 98–111)
Creatinine, Ser: 3.7 mg/dL — ABNORMAL HIGH (ref 0.61–1.24)
GFR, Estimated: 17 mL/min — ABNORMAL LOW (ref >60)
Glucose, Bld: 178 mg/dL — ABNORMAL HIGH (ref 70–99)
Potassium: 3.6 mmol/L (ref 3.5–5.1)
Sodium: 133 mmol/L — ABNORMAL LOW (ref 135–145)

## 2020-08-22 LAB — GLUCOSE, CAPILLARY
Glucose-Capillary: 136 mg/dL — ABNORMAL HIGH (ref 70–99)
Glucose-Capillary: 145 mg/dL — ABNORMAL HIGH (ref 70–99)
Glucose-Capillary: 214 mg/dL — ABNORMAL HIGH (ref 70–99)

## 2020-08-22 LAB — MRSA NEXT GEN BY PCR, NASAL: MRSA by PCR Next Gen: NOT DETECTED

## 2020-08-22 SURGERY — RIGHT HEART CATH
Anesthesia: LOCAL

## 2020-08-22 MED ORDER — HEPARIN SODIUM (PORCINE) 1000 UNIT/ML IJ SOLN
INTRAMUSCULAR | Status: AC
  Filled 2020-08-22: qty 1

## 2020-08-22 MED ORDER — MIDAZOLAM HCL 2 MG/2ML IJ SOLN
INTRAMUSCULAR | Status: DC | PRN
  Administered 2020-08-22: 1 mg via INTRAVENOUS

## 2020-08-22 MED ORDER — FENTANYL CITRATE (PF) 100 MCG/2ML IJ SOLN
INTRAMUSCULAR | Status: DC | PRN
  Administered 2020-08-22: 25 ug via INTRAVENOUS

## 2020-08-22 MED ORDER — SODIUM CHLORIDE 0.9 % IV SOLN: INTRAVENOUS | Status: AC

## 2020-08-22 MED ORDER — LIDOCAINE HCL 1 % IJ SOLN
INTRAMUSCULAR | Status: AC
  Filled 2020-08-22: qty 20

## 2020-08-22 MED ORDER — POTASSIUM CHLORIDE CRYS ER 20 MEQ PO TBCR
20.0000 meq | EXTENDED_RELEASE_TABLET | Freq: Once | ORAL | Status: AC
  Administered 2020-08-22: 20 meq via ORAL
  Filled 2020-08-22: qty 1

## 2020-08-22 MED ORDER — SODIUM CHLORIDE 0.9 % IV SOLN: 250.0000 mL | INTRAVENOUS | Status: DC | PRN

## 2020-08-22 MED ORDER — HEPARIN (PORCINE) IN NACL 1000-0.9 UT/500ML-% IV SOLN
INTRAVENOUS | Status: DC | PRN
  Administered 2020-08-22: 500 mL

## 2020-08-22 MED ORDER — MIDAZOLAM HCL 2 MG/2ML IJ SOLN
INTRAMUSCULAR | Status: AC
  Filled 2020-08-22: qty 2

## 2020-08-22 MED ORDER — SODIUM CHLORIDE 0.9 % IV SOLN: INTRAVENOUS | Status: DC

## 2020-08-22 MED ORDER — FENTANYL CITRATE (PF) 100 MCG/2ML IJ SOLN
INTRAMUSCULAR | Status: AC
  Filled 2020-08-22: qty 2

## 2020-08-22 MED ORDER — SODIUM CHLORIDE 0.9% FLUSH: 3.0000 mL | INTRAVENOUS | Status: DC | PRN

## 2020-08-22 MED ORDER — SODIUM CHLORIDE 0.9 % IV SOLN
INTRAVENOUS | Status: AC | PRN
  Administered 2020-08-22: 75 mL/h via INTRAVENOUS

## 2020-08-22 MED ORDER — APIXABAN 5 MG PO TABS
5.0000 mg | ORAL_TABLET | Freq: Two times a day (BID) | ORAL | Status: DC
  Administered 2020-08-22 – 2020-09-01 (×20): 5 mg via ORAL
  Filled 2020-08-22 (×20): qty 1

## 2020-08-22 MED ORDER — LIDOCAINE HCL (PF) 1 % IJ SOLN
INTRAMUSCULAR | Status: DC | PRN
  Administered 2020-08-22: 5 mL

## 2020-08-22 MED ORDER — CHLORHEXIDINE GLUCONATE CLOTH 2 % EX PADS
6.0000 | MEDICATED_PAD | Freq: Every day | CUTANEOUS | Status: DC
  Administered 2020-08-23 – 2020-09-01 (×10): 6 via TOPICAL

## 2020-08-22 MED ORDER — SODIUM CHLORIDE 0.9 % IV SOLN
INTRAVENOUS | Status: DC
Start: 2020-08-23 — End: 2020-08-22

## 2020-08-22 MED ORDER — LIDOCAINE HCL (PF) 1 % IJ SOLN
INTRAMUSCULAR | Status: DC | PRN
Start: 2020-08-22 — End: 2020-08-29
  Administered 2020-08-22: 5 mL

## 2020-08-22 SURGICAL SUPPLY — 8 items
CATH SWAN GANZ VIP 7.5F (CATHETERS) ×1 IMPLANT
KIT HEART LEFT (KITS) ×2 IMPLANT
KIT MICROPUNCTURE NIT STIFF (SHEATH) ×1 IMPLANT
PACK CARDIAC CATHETERIZATION (CUSTOM PROCEDURE TRAY) ×2 IMPLANT
SHEATH PINNACLE 7F 10CM (SHEATH) IMPLANT
SHEATH PINNACLE 8F 10CM (SHEATH) ×1 IMPLANT
SHEATH PROBE COVER 6X72 (BAG) ×1 IMPLANT
TRANSDUCER W/STOPCOCK (MISCELLANEOUS) ×2 IMPLANT

## 2020-08-22 NOTE — Progress Notes (Addendum)
Subjective:   Patient feeling about the same today. Reports some dizziness and headache but no nausea, vomiting, CP or SOB. Has not eaten since yesterday and looking forward to eating again. UOP of ~1.1 L over the last 24 hrs. BUN and crt trending down   Objective Vital signs in last 24 hours: Vitals:   08/22/20 0514 08/22/20 0600 08/22/20 0718 08/22/20 0726  BP:  113/73 126/71   Pulse:  84  75  Resp:  19  (!) 23  Temp:      TempSrc:      SpO2:  99%  97%  Weight: 69.2 kg     Height:       Weight change: -0.227 kg  Intake/Output Summary (Last 24 hours) at 08/22/2020 1302 Last data filed at 08/22/2020 0606 Gross per 24 hour  Intake 200 ml  Output 1075 ml  Net -875 ml    Assessment/ Plan: Pt is a 70 y.o. yo male with CAD, cardiomyopathy, HTN, paroxysmal A. fib, COPD, CKD 3b and T2DM who was admitted on 08/20/2020 with acute on chronic systolic heart failure. Nephrology consulted for acute on chronic renal failure.  Assessment/Plan: AKI on CKD 3 stage IIIb - Patient with a history of CKD since 2019. Baseline creatinine around 2.0. Found to have elevated BUN/sCr of 81/3.93 on admission. Kidney function worsened initially but now improving, sCr 4.34->3.70, BUN 96->84. No electrolyte abnormalities. UOP of 1.1 L with net I/O of -875 cc. Appetite improved. Still with mild asterixis but euvolemic on exam. S/p placement of non-tunneled HD catheter placement today. Due to improvement in kidney function, will hold off HD today and reassess tomorrow.             -- UA and MA/CR still pending             -- RHC today -- Continue Milrinone to help with forward flow             -- Continue to hold spironolactone, torsemide and Farxiga for now             -- Trend BMP, electrolyte             -- Strict I&O, daily weight 2. Hypertension/volume - Patient found to have a borderline low BP on admission.  Started on midodrine with some improvement. BP still 100-120s/60-70s.              -- Continue  holding antihypertensives as above             -- Continue midodrine 5 mg 3 times daily 4. Anemia (IDA) -iron studies 2 months ago showed iron 22, iron sat 8%, ferritin 42.  Hemoglobin normal at 14.7 on admission. Hgb 12.5 today. Daily CBC 5.  Acute on chronic systolic HF: Thought to be due to low output HF. Plan for RHC today to assess pressures. Management per cardiology 6.  CAD s/p DES to Lcx in 3/19 -continue home atorvastatin and Plavix.  7.  A. Fib on Eliquis: On amiodarone for rate control. Remains in A. Fib with HR in the 70s to 80s. Back on Eliquis today. Management per cardiology. 8.  T2DM: recent A1c of 7.4. CBGs improved to the 130s-150s.  Douglas Villa   Patient seen and examined, agree with above note with above modifications. Pt seen after he had his non tunneled HD cath placed and before his right heart cath-  he is hugry.   Interestingly -  he made a liter of urine  and his BUN and crt trended down over last 24 hours on milrinone and midodrine.  Therefore, will hold off on doing HD today but continue to follow  closely  Douglas Parish, MD 08/22/2020     Labs: Basic Metabolic Panel: Recent Labs  Lab 08/20/20 1002 08/21/20 0538 08/21/20 1344 08/22/20 0259  NA 137 134*  --  133*  K 4.3 4.2  --  3.6  CL 99 101  --  102  CO2 18* 20*  --  22  GLUCOSE 105* 167*  --  178*  BUN 81* 96*  --  84*  CREATININE 3.93* 4.34*  --  3.70*  CALCIUM 9.9 9.0  --  8.7*  PHOS  --   --  3.4  --    Liver Function Tests: No results for input(s): AST, ALT, ALKPHOS, BILITOT, PROT, ALBUMIN in the last 168 hours. No results for input(s): LIPASE, AMYLASE in the last 168 hours. No results for input(s): AMMONIA in the last 168 hours. CBC: Recent Labs  Lab 08/20/20 1002 08/20/20 1654 08/22/20 0259  WBC 6.9 5.3 5.1  HGB 16.3 14.7 12.5*  HCT 52.0 45.7 36.4*  MCV 88.9 88.1 82.9  PLT 253 245 192   Cardiac Enzymes: No results for input(s): CKTOTAL, CKMB, CKMBINDEX, TROPONINI in  the last 168 hours. CBG: Recent Labs  Lab 08/21/20 1121 08/21/20 1535 08/21/20 2052 08/21/20 2054 08/22/20 0555  GLUCAP 265* 248* 51* 153* 136*    Iron Studies: No results for input(s): IRON, TIBC, TRANSFERRIN, FERRITIN in the last 72 hours. Studies/Results: DG Chest 2 View  Result Date: 08/21/2020 CLINICAL DATA:  Dizziness and nausea EXAM: CHEST - 2 VIEW COMPARISON:  07/17/2020 FINDINGS: Cardiac shadow is within normal limits. Aortic calcifications are seen. Lungs are well aerated bilaterally. No focal infiltrate or effusion is seen. No acute bony abnormality is noted. IMPRESSION: No active cardiopulmonary disease. Electronically Signed   By: Inez Catalina M.D.   On: 08/21/2020 08:31   ECHOCARDIOGRAM COMPLETE  Result Date: 08/21/2020    ECHOCARDIOGRAM REPORT   Patient Name:   Douglas Villa Date of Exam: 08/21/2020 Medical Rec #:  [id number]         Height:       67.0 in Accession #:    [id number]        Weight:       155.2 lb Date of Birth:  08/26/1950          BSA:          1.816 m Patient Age:    60 years          BP:           118/79 mmHg Patient Gender: M                 HR:           103 bpm. Exam Location:  Inpatient Procedure: 2D Echo, Cardiac Doppler and Color Doppler Indications:    J50.09 Chronic systolic (congestive) heart failure  History:        Patient has prior history of Echocardiogram examinations, most                 recent 06/27/2020. CAD; Risk Factors:Hypertension, Diabetes and                 Sleep Apnea. CKD.  Sonographer:    Jonelle Sidle Dance Referring Phys: [place]  1. Left ventricular ejection fraction, by estimation, is 25%. The  left ventricle has severely decreased function. The left ventricle demonstrates regional wall motion abnormalities (apical akinesis without evidence of LV thrombus). There is mild concentric left ventricular hypertrophy. Left ventricular diastolic parameters are consistent with Grade III diastolic dysfunction  (restrictive).  2. Right ventricular systolic function is normal. The right ventricular size is normal. There is normal pulmonary artery systolic pressure. The estimated right ventricular systolic pressure is 41.2 mmHg.  3. Left atrial size was moderately dilated.  4. The mitral valve is abnormal. Mild mitral valve regurgitation. No evidence of mitral stenosis.  5. The aortic valve is tricuspid. There is mild calcification of the aortic valve. Aortic valve regurgitation is mild. Aortic valve sclerosis is present, with no evidence of aortic valve stenosis. Aortic regurgitation PHT measures 626 msec.  6. The inferior vena cava is normal in size with greater than 50% respiratory variability, suggesting right atrial pressure of 3 mmHg. Comparison(s): A prior study was performed on 06/27/20. Prior images reviewed side by side. RV function continues to improve. FINDINGS  Left Ventricle: Left ventricular ejection fraction, by estimation, is 25%. The left ventricle has severely decreased function. The left ventricle demonstrates regional wall motion abnormalities. The left ventricular internal cavity size was normal in size. There is mild concentric left ventricular hypertrophy. Left ventricular diastolic parameters are consistent with Grade III diastolic dysfunction (restrictive).  LV Wall Scoring: The apical lateral segment, apical septal segment, apical anterior segment, and apical inferior segment are akinetic. Right Ventricle: The right ventricular size is normal. No increase in right ventricular wall thickness. Right ventricular systolic function is normal. There is normal pulmonary artery systolic pressure. The tricuspid regurgitant velocity is 2.43 m/s, and  with an assumed right atrial pressure of 3 mmHg, the estimated right ventricular systolic pressure is 87.8 mmHg. Left Atrium: Left atrial size was moderately dilated. Right Atrium: Right atrial size was normal in size. Pericardium: There is no evidence of  pericardial effusion. Mitral Valve: The mitral valve is abnormal. There is mild thickening of the mitral valve leaflet(s). There is mild calcification of the mitral valve leaflet(s). Mild mitral valve regurgitation. No evidence of mitral valve stenosis. Tricuspid Valve: The tricuspid valve is normal in structure. Tricuspid valve regurgitation is mild . No evidence of tricuspid stenosis. Aortic Valve: The aortic valve is tricuspid. There is mild calcification of the aortic valve. Aortic valve regurgitation is mild. Aortic regurgitation PHT measures 626 msec. Mild aortic valve sclerosis is present, with no evidence of aortic valve stenosis. Pulmonic Valve: The pulmonic valve was normal in structure. Pulmonic valve regurgitation is not visualized. Aorta: The aortic root and ascending aorta are structurally normal, with no evidence of dilitation when indexed to age and body surface area. Venous: The inferior vena cava is normal in size with greater than 50% respiratory variability, suggesting right atrial pressure of 3 mmHg. IAS/Shunts: No atrial level shunt detected by color flow Doppler.  LEFT VENTRICLE PLAX 2D LVIDd:         4.85 cm  Diastology LVIDs:         4.80 cm  LV e' medial:    7.83 cm/s LV PW:         1.55 cm  LV E/e' medial:  12.9 LV IVS:        1.25 cm  LV e' lateral:   9.25 cm/s LVOT diam:     2.20 cm  LV E/e' lateral: 10.9 LV SV:         48 LV SV Index:  26 LVOT Area:     3.80 cm  RIGHT VENTRICLE             IVC RV Basal diam:  2.90 cm     IVC diam: 1.50 cm RV S prime:     11.30 cm/s TAPSE (M-mode): 1.6 cm LEFT ATRIUM             Index       RIGHT ATRIUM           Index LA diam:        4.80 cm 2.64 cm/m  RA Area:     15.20 cm LA Vol (A2C):   88.5 ml 48.74 ml/m RA Volume:   36.00 ml  19.83 ml/m LA Vol (A4C):   77.9 ml 42.90 ml/m LA Biplane Vol: 84.9 ml 46.76 ml/m  AORTIC VALVE LVOT Vmax:   88.60 cm/s LVOT Vmean:  56.450 cm/s LVOT VTI:    0.126 m AI PHT:      626 msec  AORTA Ao Root diam: 3.90 cm Ao  Asc diam:  3.10 cm MITRAL VALVE                TRICUSPID VALVE MV Area (PHT): 5.02 cm     TR Peak grad:   23.6 mmHg MV Decel Time: 151 msec     TR Vmax:        243.00 cm/s MV E velocity: 101.00 cm/s MV A velocity: 26.70 cm/s   SHUNTS MV E/A ratio:  3.78         Systemic VTI:  0.13 m                             Systemic Diam: 2.20 cm Rudean Haskell MD Electronically signed by Rudean Haskell MD Signature Date/Time: 08/21/2020/11:49:26 AM    Final    [place] EKG SITE RITE  Result Date: 08/20/2020 If Site Rite image not attached, placement could not be confirmed due to current cardiac rhythm.  Medications: Infusions:  sodium chloride     sodium chloride     [START ON 08/23/2020] sodium chloride     sodium chloride     sodium chloride     milrinone 0.25 mcg/kg/min (08/22/20 0045)    Scheduled Medications:  amiodarone  200 mg Oral BID   atorvastatin  80 mg Oral Daily   clopidogrel  75 mg Oral Daily   midodrine  5 mg Oral TID WC   pantoprazole  40 mg Oral Q1200   potassium chloride  20 mEq Oral Once   sodium chloride flush  3 mL Intravenous Q12H   sodium chloride flush  3 mL Intravenous Q12H   sodium chloride flush  3 mL Intravenous Q12H    have reviewed scheduled and prn medications.  Physical Exam: General: Pleasant, well-appearing man laying in bed. No acute distress. Chest: non-TDC on R chest.  CV: Regular rate. Irregular rhythm. No murmurs, rubs, or gallops. No LE edema Pulmonary: Lungs CTAB. Normal effort. No wheezing or rales. Abdominal: Soft, nontender, nondistended. Normal bowel sounds. Extremities: Palpable radial and DP pulses. Normal ROM. Mild asterixis. Skin: Warm and dry. No obvious rash or lesions. Neuro: A&Ox3. Moves all extremities. Normal sensation. No focal deficit.    08/22/2020,7:57 AM  LOS: 2 days

## 2020-08-22 NOTE — Plan of Care (Signed)

## 2020-08-22 NOTE — TOC Initial Note (Addendum)
Transition of Care (TOC) - Initial/Assessment Note  Heart Failure   Patient Details  Name: Douglas Villa MRN: [id number] Date of Birth: 1950-05-16  Transition of Care [facility]) CM/SW Contact:    [place], [place] Phone Number: 08/22/2020, 2:46 PM  Clinical Narrative:                 CSW spoke with the patient at bedside and completed a very brief SDOH screening with the patient who reported that he is living at the [facility] in [place], [place]. Douglas Villa reported that the [facility] is $330 a week and he would like a more permanent living situation. CSW will speak with HF outpatient social worker and see what options we can find as housing is a barrier in [place] and the surrounding area. Douglas Villa reported that he receives social security with no other income for support at this time.  CSW will continue to follow throughout discharge.    Barriers to Discharge: Continued Medical Work up   Patient Goals and CMS Choice        Expected Discharge Plan and Services   In-house Referral: Clinical Social Work     Living arrangements for the past 2 months: Hotel/Motel                                      Prior Living Arrangements/Services Living arrangements for the past 2 months: Hotel/Motel Lives with:: Self Patient language and need for interpreter reviewed:: Yes        Need for Family Participation in Patient Care: No (Comment) Care giver support system in place?: No (comment)   Criminal Activity/Legal Involvement Pertinent to Current Situation/Hospitalization: No - Comment as needed  Activities of Daily Living      Permission Sought/Granted                  Emotional Assessment Appearance:: Appears stated age Attitude/Demeanor/Rapport: Engaged Affect (typically observed): Pleasant Orientation: : Oriented to Self, Oriented to Place, Oriented to  Time, Oriented to Situation   Psych Involvement: No (comment)  Admission diagnosis:  Acute systolic  heart failure ([place]) [I50.21] Acute kidney injury ([place]) [N17.9] Patient Active Problem List   Diagnosis Date Noted   Acute systolic heart failure ([place]) 08/20/2020   AKI (acute kidney injury) ([place]) 07/17/2020   Hyperkalemia    Atrial fibrillation ([place]) 06/29/2020   Tricuspid regurgitation 06/29/2020   Anemia 06/29/2020   Hyponatremia 06/29/2020   Thrombocytopenia ([place]) 06/29/2020   STEMI involving left anterior descending coronary artery ([place]) 06/20/2020   Acute ST elevation myocardial infarction (STEMI) due to occlusion of distal portion of left anterior descending (LAD) coronary artery ([place])    Cardiogenic shock ([place])    Type 2 diabetes mellitus with diabetic nephropathy ([place]) 06/18/2020   Healthcare maintenance 09/16/2017   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   Obstructive sleep apnea 08/05/2017   Hyperlipidemia 06/03/2017   Acute on chronic systolic heart failure ([place]) 06/03/2017   Chronic kidney disease, stage 3 ([place]) 06/03/2017   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[place]) 06/03/2017   Essential hypertension 06/03/2017   Atherosclerotic heart disease of native coronary artery with unstable angina pectoris ([place]) 06/03/2017   Ex-smoker 06/03/2017   PCP:  Lillard Anes, MD Pharmacy:   [facility] [address] [place] [address] [place] [address] Phone: [phone] Fax: [place] [phone] -  [place], [place] - [address] [address] [place] [address] Phone: [phone] Fax: [phone]  [facility] [address] [place] [address] Phone: [phone] Fax: [phone]     Social Determinants of Health (SDOH) Interventions Food Insecurity Interventions: Assist with SNAP Application Financial Strain Interventions: Development worker, community Housing Interventions: Other (Comment) (Referral to HF outpatient to see about options) Transportation Interventions: Intervention Not  Indicated  Readmission Risk Interventions No flowsheet data found.  Chosen Garron, MSW, [place] Heart Failure Social Worker

## 2020-08-22 NOTE — Progress Notes (Addendum)
Patient ID: Douglas Villa, male   DOB: 30-Oct-1950, 70 y.o.   MRN: [id number]     Advanced Heart Failure Rounding Note  PCP-Cardiologist: Dr. Aundra Dubin   Subjective:    07/20: Admit with malaise, N/V, fatigue and low output HF w/ AKI on CKD. Started milrinone 0.25.  07/21: Nephrology consulted. Considering HD. Diuretics held.  Going for RHC today.   Creatinine 4.34 > 3.70  No chest pain or shortness of breath. Reports intermittent dizziness with position changes.    Echo, 08/21/2020: EF 25%, apical akinesis, GIIDD, RV okay,   RVSP 26 mmHg, mild MR, mild AI    Objective:   Weight Range: 69.2 kg Body mass index is 23.88 kg/m.   Vital Signs:   Temp:  [97.6 F (36.4 C)-97.8 F (36.6 C)] 97.8 F (36.6 C) (07/22 0006) Pulse Rate:  [78-108] 84 (07/22 0600) Resp:  [17-24] 19 (07/22 0600) BP: (103-123)/(69-80) 113/73 (07/22 0600) SpO2:  [91 %-100 %] 99 % (07/22 0600) Weight:  [69.2 kg] 69.2 kg (07/22 0514) Last BM Date: 08/16/20  Weight change: Filed Weights   08/20/20 1609 08/21/20 0300 08/22/20 0514  Weight: 70.3 kg 70.4 kg 69.2 kg    Intake/Output:   Intake/Output Summary (Last 24 hours) at 08/22/2020 9924 Last data filed at 08/22/2020 0606 Gross per 24 hour  Intake 200 ml  Output 1075 ml  Net -875 ml      Physical Exam    General:  Well appearing elderly male. No resp difficulty HEENT: Normal Neck: Supple. JVP difficult to assess, does not appear elevated. Carotids 2+ bilat; no bruits. No lymphadenopathy or thyromegaly appreciated. Cor: PMI lateral. Irregular rhythm. Rate 80s. No rubs, gallops or murmurs. Lungs: CTA bilaterally. Abdomen: Soft, nontender, nondistended. No hepatosplenomegaly. No bruits or masses. Good bowel sounds. Extremities: No cyanosis, clubbing, rash, edema Neuro: Alert & orientedx3, cranial nerves grossly intact. moves all 4 extremities w/o difficulty. Affect pleasant   Telemetry   Atrial flutter, 70s-80s (personally reviewed)  Labs     CBC Recent Labs    08/20/20 1654 08/22/20 0259  WBC 5.3 5.1  HGB 14.7 12.5*  HCT 45.7 36.4*  MCV 88.1 82.9  PLT 245 268   Basic Metabolic Panel Recent Labs    08/21/20 0538 08/21/20 1344 08/22/20 0259  NA 134*  --  133*  K 4.2  --  3.6  CL 101  --  102  CO2 20*  --  22  GLUCOSE 167*  --  178*  BUN 96*  --  84*  CREATININE 4.34*  --  3.70*  CALCIUM 9.0  --  8.7*  MG  --  2.8*  --   PHOS  --  3.4  --    Liver Function Tests No results for input(s): AST, ALT, ALKPHOS, BILITOT, PROT, ALBUMIN in the last 72 hours. No results for input(s): LIPASE, AMYLASE in the last 72 hours. Cardiac Enzymes No results for input(s): CKTOTAL, CKMB, CKMBINDEX, TROPONINI in the last 72 hours.  BNP: BNP (last 3 results) Recent Labs    07/22/20 0109 07/23/20 0436 08/20/20 1002  BNP 2,071.8* 1,879.9* 338.1*    ProBNP (last 3 results) No results for input(s): PROBNP in the last 8760 hours.   D-Dimer No results for input(s): DDIMER in the last 72 hours. Hemoglobin A1C No results for input(s): HGBA1C in the last 72 hours. Fasting Lipid Panel No results for input(s): CHOL, HDL, LDLCALC, TRIG, CHOLHDL, LDLDIRECT in the last 72 hours. Thyroid Function Tests No results for  input(s): TSH, T4TOTAL, T3FREE, THYROIDAB in the last 72 hours.  Invalid input(s): FREET3  Other results:   Imaging    DG Chest 2 View  Result Date: 08/21/2020 CLINICAL DATA:  Dizziness and nausea EXAM: CHEST - 2 VIEW COMPARISON:  07/17/2020 FINDINGS: Cardiac shadow is within normal limits. Aortic calcifications are seen. Lungs are well aerated bilaterally. No focal infiltrate or effusion is seen. No acute bony abnormality is noted. IMPRESSION: No active cardiopulmonary disease. Electronically Signed   By: Inez Catalina M.D.   On: 08/21/2020 08:31   ECHOCARDIOGRAM COMPLETE  Result Date: 08/21/2020    ECHOCARDIOGRAM REPORT   Patient Name:   Douglas Villa Date of Exam: 08/21/2020 Medical Rec #:  [id number]          Height:       67.0 in Accession #:    [id number]        Weight:       155.2 lb Date of Birth:  15-May-1950          BSA:          1.816 m Patient Age:    66 years          BP:           118/79 mmHg Patient Gender: M                 HR:           103 bpm. Exam Location:  Inpatient Procedure: 2D Echo, Cardiac Doppler and Color Doppler Indications:    X38.18 Chronic systolic (congestive) heart failure  History:        Patient has prior history of Echocardiogram examinations, most                 recent 06/27/2020. CAD; Risk Factors:Hypertension, Diabetes and                 Sleep Apnea. CKD.  Sonographer:    Jonelle Sidle Dance Referring Phys: [place]  1. Left ventricular ejection fraction, by estimation, is 25%. The left ventricle has severely decreased function. The left ventricle demonstrates regional wall motion abnormalities (apical akinesis without evidence of LV thrombus). There is mild concentric left ventricular hypertrophy. Left ventricular diastolic parameters are consistent with Grade III diastolic dysfunction (restrictive).  2. Right ventricular systolic function is normal. The right ventricular size is normal. There is normal pulmonary artery systolic pressure. The estimated right ventricular systolic pressure is 29.9 mmHg.  3. Left atrial size was moderately dilated.  4. The mitral valve is abnormal. Mild mitral valve regurgitation. No evidence of mitral stenosis.  5. The aortic valve is tricuspid. There is mild calcification of the aortic valve. Aortic valve regurgitation is mild. Aortic valve sclerosis is present, with no evidence of aortic valve stenosis. Aortic regurgitation PHT measures 626 msec.  6. The inferior vena cava is normal in size with greater than 50% respiratory variability, suggesting right atrial pressure of 3 mmHg. Comparison(s): A prior study was performed on 06/27/20. Prior images reviewed side by side. RV function continues to improve. FINDINGS  Left  Ventricle: Left ventricular ejection fraction, by estimation, is 25%. The left ventricle has severely decreased function. The left ventricle demonstrates regional wall motion abnormalities. The left ventricular internal cavity size was normal in size. There is mild concentric left ventricular hypertrophy. Left ventricular diastolic parameters are consistent with Grade III diastolic dysfunction (restrictive).  LV Wall Scoring: The apical lateral segment, apical septal segment, apical  anterior segment, and apical inferior segment are akinetic. Right Ventricle: The right ventricular size is normal. No increase in right ventricular wall thickness. Right ventricular systolic function is normal. There is normal pulmonary artery systolic pressure. The tricuspid regurgitant velocity is 2.43 m/s, and  with an assumed right atrial pressure of 3 mmHg, the estimated right ventricular systolic pressure is 67.1 mmHg. Left Atrium: Left atrial size was moderately dilated. Right Atrium: Right atrial size was normal in size. Pericardium: There is no evidence of pericardial effusion. Mitral Valve: The mitral valve is abnormal. There is mild thickening of the mitral valve leaflet(s). There is mild calcification of the mitral valve leaflet(s). Mild mitral valve regurgitation. No evidence of mitral valve stenosis. Tricuspid Valve: The tricuspid valve is normal in structure. Tricuspid valve regurgitation is mild . No evidence of tricuspid stenosis. Aortic Valve: The aortic valve is tricuspid. There is mild calcification of the aortic valve. Aortic valve regurgitation is mild. Aortic regurgitation PHT measures 626 msec. Mild aortic valve sclerosis is present, with no evidence of aortic valve stenosis. Pulmonic Valve: The pulmonic valve was normal in structure. Pulmonic valve regurgitation is not visualized. Aorta: The aortic root and ascending aorta are structurally normal, with no evidence of dilitation when indexed to age and body  surface area. Venous: The inferior vena cava is normal in size with greater than 50% respiratory variability, suggesting right atrial pressure of 3 mmHg. IAS/Shunts: No atrial level shunt detected by color flow Doppler.  LEFT VENTRICLE PLAX 2D LVIDd:         4.85 cm  Diastology LVIDs:         4.80 cm  LV e' medial:    7.83 cm/s LV PW:         1.55 cm  LV E/e' medial:  12.9 LV IVS:        1.25 cm  LV e' lateral:   9.25 cm/s LVOT diam:     2.20 cm  LV E/e' lateral: 10.9 LV SV:         48 LV SV Index:   26 LVOT Area:     3.80 cm  RIGHT VENTRICLE             IVC RV Basal diam:  2.90 cm     IVC diam: 1.50 cm RV S prime:     11.30 cm/s TAPSE (M-mode): 1.6 cm LEFT ATRIUM             Index       RIGHT ATRIUM           Index LA diam:        4.80 cm 2.64 cm/m  RA Area:     15.20 cm LA Vol (A2C):   88.5 ml 48.74 ml/m RA Volume:   36.00 ml  19.83 ml/m LA Vol (A4C):   77.9 ml 42.90 ml/m LA Biplane Vol: 84.9 ml 46.76 ml/m  AORTIC VALVE LVOT Vmax:   88.60 cm/s LVOT Vmean:  56.450 cm/s LVOT VTI:    0.126 m AI PHT:      626 msec  AORTA Ao Root diam: 3.90 cm Ao Asc diam:  3.10 cm MITRAL VALVE                TRICUSPID VALVE MV Area (PHT): 5.02 cm     TR Peak grad:   23.6 mmHg MV Decel Time: 151 msec     TR Vmax:        243.00 cm/s MV E velocity:  101.00 cm/s MV A velocity: 26.70 cm/s   SHUNTS MV E/A ratio:  3.78         Systemic VTI:  0.13 m                             Systemic Diam: 2.20 cm Rudean Haskell MD Electronically signed by Rudean Haskell MD Signature Date/Time: 08/21/2020/11:49:26 AM    Final      Medications:     Scheduled Medications:  amiodarone  200 mg Oral BID   atorvastatin  80 mg Oral Daily   clopidogrel  75 mg Oral Daily   midodrine  5 mg Oral TID WC   pantoprazole  40 mg Oral Q1200   sodium chloride flush  3 mL Intravenous Q12H   sodium chloride flush  3 mL Intravenous Q12H   sodium chloride flush  3 mL Intravenous Q12H    Infusions:  sodium chloride     sodium chloride      [START ON 08/23/2020] sodium chloride     sodium chloride     sodium chloride     milrinone 0.25 mcg/kg/min (08/22/20 0045)    PRN Medications: sodium chloride, sodium chloride, sodium chloride, acetaminophen, sodium chloride flush, sodium chloride flush, sodium chloride flush    Assessment/Plan   1. CAD:  -H/o DES to LCX in 3/19, had occluded PLV at that time. Admitted with anterior STEMI 5/22 and occluded mLAD, had DES to mLAD, known CTO PLV with collaterals and patent LCx stent.   -No chest pain currently.  -Continue Plavix, no ASA with apixaban use.  -Continue atorvastatin 80.  2. AKI on CKD IIIb:  -Creatinine 3.93>4.34>3.70, concern this is due to low output.  -Baseline ~ 2. -Holding torsemide, spironolactone, Farxiga. -Empiric milrinone begun -Hold hydralazine/Imdur.  -Continue midodrine 5 mg TID -Nephrology consulted. Appreciate input. Considering short term HD.  3. 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  TEE 05/22 showing EF 20-25%, moderately decreased RV function with mild RVE, moderate biatrial enlargement, PASP 34, mild MR, mild TR.  Cardiogenic shock during 5/22 admission, was eventually able to titrate off IABP and milrinone.  He has struggled since discharge, now with AKI and prominent fatigue, cool extremities.  Concerned for low output HF.  He does not look significantly volume overloaded on exam and weight is down over the last few weeks, though REDS clip in office was 41%.   -Echo this admit with EF 25%, RV okay, mild MR, mild AI -Stopped torsemide, spironolactone, Farxiga, hydralazine/imdur -Continue midodrine 5 mg TID -Off digoxin with recent AKI and high level/toxicity. -Continue milrinone 0.25 mcg/kg/min.  -Unable to place PICC to assess CVP d/t renal impairment. - No diuretic for now, not markedly volume overloaded and creatinine rising.  Will  assess filling pressures by RHC.  - Concerning clinical picture with suspected recurrent low output HF.  Not LVAD candidate at this time with AKI.   4. COPD:  Prior smoker  5. Atrial flutter:  -Atypical, had DCCV in 5/22 but now back in atrial flutter.  Rate controlled.   -Continue amiodarone 200 mg bid.  -Resume apixaban post RHC -Possible TEE-guided DCCV this admission.  Will need TEE as he has missed at least a few doses of apixaban due to nausea.   Goals of Care/SDOH: -Palliative care following. Wishes to remain full code at this time. -  May need HH at discharge. Concern for possible food insecurity. TOC consult.   Length of Stay: 2  FINCH, LINDSAY N, PA-C  08/22/2020, 7:02 AM  Advanced Heart Failure Team Pager [phone] (M-F; 7a - 5p)  Please contact [facility] for night-coverage after hours (5p -7a ) and weekends on [email or web address]   Patient seen with PA, agree with the above note.   Creatinine trending down, 4.3 => 3.7.  Diuretics have been held.   RHC Procedural Findings on milrinone 0.25: Hemodynamics (mmHg) RA mean 1 RV 25/1 PA 28/9, mean 16 PCWP mean 3 Oxygen saturations: PA 65% AO 99% Cardiac Output (Fick) 4.14  Cardiac Index (Fick) 2.3 Cardiac Output (Thermo) 3.72  Cardiac Index (Thermo) 2.07  He remains in atrial flutter, rate 80s.    Nephrology following, HD catheter placed.   General: NAD Neck: No JVD, no thyromegaly or thyroid nodule.  Lungs: Clear to auscultation bilaterally with normal respiratory effort. CV: Lateral PMI.  Heart regular S1/S2, no S3/S4, no murmur.  No peripheral edema.   Abdomen: Soft, nontender, no hepatosplenomegaly, no distention.  Skin: Intact without lesions or rashes.  Neurologic: Alert and oriented x 3.  Psych: Normal affect. Extremities: No clubbing or cyanosis.  HEENT: Normal.   Cardiac output marginal but acceptable on milrinone 0.25 mcg/kg/min.  Will continue for now.  Very low filling pressures, this may contribute  to AKI and low output.  Creatinine is trending down now that we are holding torsemide.  - Encourage po hydration and will give NS at 75 cc/hr for 6 hrs then reassess.  - Continue milrinone 0.25.  - BP stable currently on midodrine.   - Think we can hold off on HD for now as creatinine is trending down.  Hopefully, it will continue to come down with some IV fluid and increased po intake.   He remains in atrial flutter.  - Continue amiodarone.   - Start back apixaban this evening if ok with renal, I do not anticipate other procedures.  - Plan for TEE-guided DCCV on Monday.   Long-term prognosis guarded, will follow course.   Loralie Champagne 08/22/2020 12:40 PM

## 2020-08-22 NOTE — Significant Event (Addendum)
Patient is being transported to IR for HD cath vitals stable with no distress,, with plans for Right Heart Cath after. Per patient Doctor Douglas Villa and Nephrologist are aware of procedures schedule.  Cath Lab team has been made aware also.

## 2020-08-22 NOTE — Procedures (Signed)
Vascular and Interventional Radiology Procedure Note  Patient: Douglas Villa DOB: 12-Dec-1950 Medical Record Number: [id number] Note Date/Time: 08/22/20 9:22 AM   Performing Physician: Michaelle Birks, MD Assistant(s): None  Diagnosis: ESRD requiring Hemodialysis  Procedure: NON-TUNNELED HEMODIALYSIS CATHETER PLACEMENT  Anesthesia: Conscious Sedation Complications: None Estimated Blood Loss: Minimal Specimens:  None  Findings:  Successful placement of right-sided, NON-tunneled hemodialysis catheter with the tip of the catheter in the proximal right atrium.  Plan: Catheter ready for use.  See detailed procedure note with images in PACS. The patient tolerated the procedure well without incident or complication and was returned to Floor Bed in stable condition.    Michaelle Birks, MD [facility]   Clinic: [phone]

## 2020-08-22 NOTE — Significant Event (Signed)
Patient is in cath lab off the monitor with the possibility of transferring to [place]. CCMD aware

## 2020-08-22 NOTE — Interval H&P Note (Signed)
History and Physical Interval Note:  08/22/2020 11:49 AM  Douglas Villa  has presented today for surgery, with the diagnosis of heart failure.  The various methods of treatment have been discussed with the patient and family. After consideration of risks, benefits and other options for treatment, the patient has consented to  Procedure(s): RIGHT HEART CATH (N/A) as a surgical intervention.  The patient's history has been reviewed, patient examined, no change in status, stable for surgery.  I have reviewed the patient's chart and labs.  Questions were answered to the patient's satisfaction.     Enzio Buchler [employer]

## 2020-08-22 NOTE — Progress Notes (Signed)
Night shift IV team already put in PIV access on Rt. FA for procedures. Explained Pt's nurse that night shift IV team couldn't find on AC area, so put in the [place]. Pt's nurse understood it. HS [employer]

## 2020-08-22 NOTE — Significant Event (Signed)
Pt has belongings from room at the desk. Including meds, wallet, charger and comb, socks, and shirt.

## 2020-08-23 LAB — COOXEMETRY PANEL
Carboxyhemoglobin: 1 % (ref 0.5–1.5)
Methemoglobin: 0.8 % (ref 0.0–1.5)
O2 Saturation: 66 %
Total hemoglobin: 10.9 g/dL — ABNORMAL LOW (ref 12.0–16.0)

## 2020-08-23 LAB — CBC
HCT: 36 % — ABNORMAL LOW (ref 39.0–52.0)
Hemoglobin: 11.8 g/dL — ABNORMAL LOW (ref 13.0–17.0)
MCH: 28.3 pg (ref 26.0–34.0)
MCHC: 32.8 g/dL (ref 30.0–36.0)
MCV: 86.3 fL (ref 80.0–100.0)
Platelets: 149 10*3/uL — ABNORMAL LOW (ref 150–400)
RBC: 4.17 MIL/uL — ABNORMAL LOW (ref 4.22–5.81)
RDW: 17.6 % — ABNORMAL HIGH (ref 11.5–15.5)
WBC: 4.2 10*3/uL (ref 4.0–10.5)
nRBC: 0 % (ref 0.0–0.2)

## 2020-08-23 LAB — BASIC METABOLIC PANEL
Anion gap: 6 (ref 5–15)
BUN: 57 mg/dL — ABNORMAL HIGH (ref 8–23)
CO2: 21 mmol/L — ABNORMAL LOW (ref 22–32)
Calcium: 8.2 mg/dL — ABNORMAL LOW (ref 8.9–10.3)
Chloride: 108 mmol/L (ref 98–111)
Creatinine, Ser: 2.58 mg/dL — ABNORMAL HIGH (ref 0.61–1.24)
GFR, Estimated: 26 mL/min — ABNORMAL LOW (ref >60)
Glucose, Bld: 109 mg/dL — ABNORMAL HIGH (ref 70–99)
Potassium: 3.6 mmol/L (ref 3.5–5.1)
Sodium: 135 mmol/L (ref 135–145)

## 2020-08-23 LAB — GLUCOSE, CAPILLARY
Glucose-Capillary: 119 mg/dL — ABNORMAL HIGH (ref 70–99)
Glucose-Capillary: 207 mg/dL — ABNORMAL HIGH (ref 70–99)
Glucose-Capillary: 87 mg/dL (ref 70–99)

## 2020-08-23 MED ORDER — POTASSIUM CHLORIDE CRYS ER 20 MEQ PO TBCR
20.0000 meq | EXTENDED_RELEASE_TABLET | Freq: Once | ORAL | Status: AC
  Administered 2020-08-23: 20 meq via ORAL
  Filled 2020-08-23: qty 1

## 2020-08-23 MED ORDER — INSULIN ASPART 100 UNIT/ML IJ SOLN
0.0000 [IU] | Freq: Three times a day (TID) | INTRAMUSCULAR | Status: DC
  Administered 2020-08-24: 1 [IU] via SUBCUTANEOUS
  Administered 2020-08-24 – 2020-08-25 (×2): 2 [IU] via SUBCUTANEOUS
  Administered 2020-08-26 – 2020-08-27 (×2): 1 [IU] via SUBCUTANEOUS
  Administered 2020-08-28: 2 [IU] via SUBCUTANEOUS
  Administered 2020-08-29 – 2020-08-30 (×2): 1 [IU] via SUBCUTANEOUS

## 2020-08-23 NOTE — Progress Notes (Signed)
Palliative:  Douglas Villa is sitting up on the edge of his bed playing a game on his phone.  He greets me, making and keeping eye contact. He is alert and oriented, pleasant, able to make his needs known.  There is now family at bedside at this time.   Douglas Villa is able to tell me about his treatment plan.  We talk about R heart cath, medications, and treatment plan.  We talk about plan for TEE on Monday and also recovery of kidney function.    HCPOA remains his former wife, Douglas Villa and their son.  We talk about code status "treat the treatable, but allow a natural death".  Douglas Villa tells me that his mother choose DNR.  We talk about a few "what if's and maybe's".  He is open to continued discussions with out patient palliative services.    We talk about home with [facility].  He tells me that he was recently discharged from [place], and that he found muscles that he forgot he had.  He shares his desire to continue to exercise and improve strength.  He tells me that he may move to [facility], but I reassure him that we can find support for him regardless.   Conference with attending, bedside nursing staff and TOC related to patient condition, needs, GOC, disposition.   Plan:  continue to treat the treatable.  Anticipate home with HH.  Out patient palliative services to follow.    52 minutes  Quinn Axe, NP Palliative Medicine Team.  Team Phone [phone] Greater than 50% of this time was spent counseling and coordinating care related to the above assessment and plan.

## 2020-08-23 NOTE — Progress Notes (Signed)
Patient ID: Douglas Villa, male   DOB: 1950/12/20, 70 y.o.   MRN: [id number]     Advanced Heart Failure Rounding Note  PCP-Cardiologist: Dr. Aundra Dubin   Subjective:    07/20: Admit with malaise, N/V, fatigue and low output HF w/ AKI on CKD. Started milrinone 0.25.  07/21: Nephrology consulted. Considering HD. Diuretics held.  Creatinine 4.34 > 3.70 > 2.58  Feeling better, more energy.   Swan numbers: RA 4-5 PA 35/15 CI 2.2 Co-ox 66%   Echo 08/21/2020: EF 25%, apical akinesis, GIIDD, RV okay,   RVSP 26 mmHg, mild MR, mild AI  RHC Procedural Findings on milrinone 0.25: Hemodynamics (mmHg) RA mean 1 RV 25/1 PA 28/9, mean 16 PCWP mean 3 Oxygen saturations: PA 65% AO 99% Cardiac Output (Fick) 4.14  Cardiac Index (Fick) 2.3 Cardiac Output (Thermo) 3.72  Cardiac Index (Thermo) 2.07  Objective:   Weight Range: 71.8 kg Body mass index is 24.79 kg/m.   Vital Signs:   Temp:  [95.9 F (35.5 C)-97.3 F (36.3 C)] 97.2 F (36.2 C) (07/23 0700) Pulse Rate:  [33-107] 107 (07/23 0700) Resp:  [12-24] 15 (07/23 0700) BP: (93-134)/(58-91) 133/84 (07/23 0600) SpO2:  [95 %-100 %] 100 % (07/23 0700) Weight:  [71.8 kg] 71.8 kg (07/23 0400) Last BM Date: 08/16/20  Weight change: Filed Weights   08/21/20 0300 08/22/20 0514 08/23/20 0400  Weight: 70.4 kg 69.2 kg 71.8 kg    Intake/Output:   Intake/Output Summary (Last 24 hours) at 08/23/2020 0727 Last data filed at 08/23/2020 0400 Gross per 24 hour  Intake 971.86 ml  Output 700 ml  Net 271.86 ml      Physical Exam    General: NAD Neck: No JVD, no thyromegaly or thyroid nodule.  Lungs: Clear to auscultation bilaterally with normal respiratory effort. CV: Lateral PMI.  Heart irregular S1/S2, no S3/S4, no murmur.  No peripheral edema.   Abdomen: Soft, nontender, no hepatosplenomegaly, no distention.  Skin: Intact without lesions or rashes.  Neurologic: Alert and oriented x 3.  Psych: Normal affect. Extremities: No  clubbing or cyanosis.  HEENT: Normal.    Telemetry   Atrial flutter, 70s-80s (personally reviewed)  Labs    CBC Recent Labs    08/22/20 0259 08/22/20 1215 08/22/20 1219 08/23/20 0446  WBC 5.1  --   --  4.2  HGB 12.5*   < > 11.9* 11.8*  HCT 36.4*   < > 35.0* 36.0*  MCV 82.9  --   --  86.3  PLT 192  --   --  149*   < > = values in this interval not displayed.   Basic Metabolic Panel Recent Labs    08/21/20 1344 08/22/20 0259 08/22/20 1215 08/22/20 1219 08/23/20 0446  NA  --  133*   < > 140 135  K  --  3.6   < > 3.4* 3.6  CL  --  102  --   --  108  CO2  --  22  --   --  21*  GLUCOSE  --  178*  --   --  109*  BUN  --  84*  --   --  57*  CREATININE  --  3.70*  --   --  2.58*  CALCIUM  --  8.7*  --   --  8.2*  MG 2.8*  --   --   --   --   PHOS 3.4  --   --   --   --    < > =  values in this interval not displayed.   Liver Function Tests No results for input(s): AST, ALT, ALKPHOS, BILITOT, PROT, ALBUMIN in the last 72 hours. No results for input(s): LIPASE, AMYLASE in the last 72 hours. Cardiac Enzymes No results for input(s): CKTOTAL, CKMB, CKMBINDEX, TROPONINI in the last 72 hours.  BNP: BNP (last 3 results) Recent Labs    07/22/20 0109 07/23/20 0436 08/20/20 1002  BNP 2,071.8* 1,879.9* 338.1*    ProBNP (last 3 results) No results for input(s): PROBNP in the last 8760 hours.   D-Dimer No results for input(s): DDIMER in the last 72 hours. Hemoglobin A1C No results for input(s): HGBA1C in the last 72 hours. Fasting Lipid Panel No results for input(s): CHOL, HDL, LDLCALC, TRIG, CHOLHDL, LDLDIRECT in the last 72 hours. Thyroid Function Tests No results for input(s): TSH, T4TOTAL, T3FREE, THYROIDAB in the last 72 hours.  Invalid input(s): FREET3  Other results:   Imaging    CARDIAC CATHETERIZATION  Result Date: 08/22/2020 1. Low filling pressures. 2. Marginal thermodilution cardiac output, preserved Fick cardiac output on 0.25 mcg/kg/min  milrinone.   IR Fluoro Guide CV Line Right  Result Date: 08/22/2020 INDICATION: 70 year old male with CKD, requiring HD initiation EXAM: NON-TUNNELED CENTRAL VENOUS HEMODIALYSIS CATHETER PLACEMENT WITH ULTRASOUND AND FLUOROSCOPIC GUIDANCE COMPARISON:  Chest radiograph, 08/21/2020. MEDICATIONS: None FLUOROSCOPY TIME:  0 minutes, 6 seconds (0 mGy) COMPLICATIONS: None immediate. PROCEDURE: Informed written consent was obtained from the patient after a discussion of the risks, benefits, and alternatives to treatment. Questions regarding the procedure were encouraged and answered. The right neck and chest were prepped with chlorhexidine in a sterile fashion, and a sterile drape was applied covering the operative field. Maximum barrier sterile technique with sterile gowns and gloves were used for the procedure. A timeout was performed prior to the initiation of the procedure. After the overlying soft tissues were anesthetized, a small venotomy incision was created and a micropuncture kit was utilized to access the internal jugular vein. Real-time ultrasound guidance was utilized for vascular access including the acquisition of a permanent ultrasound image documenting patency of the accessed vessel. The microwire was utilized to measure appropriate catheter length. A stiff glidewire was advanced to the level of the IVC. Under fluoroscopic guidance, the venotomy was serially dilated, ultimately allowing placement of a 15 cm temporary Trialysis catheter with tip ultimately terminating within the superior aspect of the right atrium. Final catheter positioning was confirmed and documented with a spot radiographic image. The catheter aspirates and flushes normally. The catheter was flushed with appropriate volume heparin dwells. The catheter exit site was secured with a 2-0 nylon retention suture. A dressing was placed. The patient tolerated the procedure well without immediate post procedural complication. IMPRESSION:  Successful placement of a right internal jugular approach 15 cm temporary dialysis catheter with tip terminating with in the superior aspect of the right atrium. The catheter is ready for immediate use. PLAN: This catheter may be converted to a tunneled dialysis catheter at a later date as indicated. CLINICAL DATA:  70 year old male with chronic kidney disease, requiring hemodialysis initiation. Electronically Signed   By: Michaelle Birks MD   On: 08/22/2020 15:22     Medications:     Scheduled Medications:  amiodarone  200 mg Oral BID   apixaban  5 mg Oral BID   atorvastatin  80 mg Oral Daily   Chlorhexidine Gluconate Cloth  6 each Topical Daily   clopidogrel  75 mg Oral Daily   midodrine  5 mg Oral TID WC   pantoprazole  40 mg Oral Q1200   sodium chloride flush  3 mL Intravenous Q12H   sodium chloride flush  3 mL Intravenous Q12H    Infusions:  sodium chloride     milrinone 0.25 mcg/kg/min (08/23/20 0400)    PRN Medications: sodium chloride, acetaminophen, lidocaine (PF), sodium chloride flush    Assessment/Plan   1. CAD: H/o DES to LCX in 3/19, had occluded PLV at that time. Admitted with anterior STEMI 5/22 and occluded mLAD, had DES to mLAD, known CTO PLV with collaterals and patent LCx stent.  No chest pain now.  - Continue Plavix, no ASA with apixaban use.  - Continue atorvastatin 80. 2. AKI on CKD IIIb: Creatinine 3.93>4.34>3.70>2.58.  Suspect combination of low output and low volume status, improving with milrinone and gentle rehydration.. - Holding torsemide, spironolactone, Farxiga, hydralazine/Imdur. - Continue milrinone for now.  - Continue midodrine 5 mg TID - Holding off on HD with improved creatinine.  3. 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  TEE 05/22 showing EF 20-25%, moderately decreased RV function with mild RVE, moderate biatrial enlargement, PASP  34, mild MR, mild TR.  Cardiogenic shock during 5/22 admission, was eventually able to titrate off IABP and milrinone.  He has struggled since discharge, admitted this time with with AKI and prominent fatigue, cool extremities, low output HF. Echo this admit with EF 25%, RV okay, mild MR, mild AI.  Milrinone 0.25 started, Swan placed.  This morning, CI 2.2 with co-ox 66% on milrinone 0.25.  CVP 4-5, he had IVF yesterday.  - Think we can hold off on further IVF and encourage po hydration. No diuretic.  - Stopped torsemide, spironolactone, Farxiga, hydralazine/imdur - Continue midodrine 5 mg TID - Off digoxin with recent AKI and high level/toxicity. - Continue milrinone 0.25 mcg/kg/min.  - Concerning clinical picture with suspected recurrent low output HF.  Not LVAD candidate at this time with AKI.  4. COPD: Prior smoker 5. Atrial flutter: Atypical, had DCCV in 5/22 but now back in atrial flutter.  Rate controlled.   - Continue amiodarone 200 mg bid.  - Resumed apixaban post RHC - TEE-guided DCCV on Monday.  Long-term prognosis guarded, will follow course.   CRITICAL CARE Performed by: Loralie Champagne  Total critical care time: 35 minutes  Critical care time was exclusive of separately billable procedures and treating other patients.  Critical care was necessary to treat or prevent imminent or life-threatening deterioration.  Critical care was time spent personally by me on the following activities: development of treatment plan with patient and/or surrogate as well as nursing, discussions with consultants, evaluation of patient's response to treatment, examination of patient, obtaining history from patient or surrogate, ordering and performing treatments and interventions, ordering and review of laboratory studies, ordering and review of radiographic studies, pulse oximetry and re-evaluation of patient's condition.   Loralie Champagne 08/23/2020 7:27 AM

## 2020-08-23 NOTE — Evaluation (Signed)
Physical Therapy Evaluation Patient Details Name: Douglas Villa MRN: [id number] DOB: [birth year] Today's Date: 08/23/2020   History of Present Illness  Pt is a 70 y.o. yo male  who was admitted on 08/20/2020 with acute on chronic systolic heart failure. Echo with EF 25%; underwent Rt heart cath; AKI with temporary dialysis catheter placed but HD not yet needed;   PMH CAD, cardiomyopathy, HTN, paroxysmal A. fib, COPD, CKD 3b and T2DM  Clinical Impression   Pt admitted secondary to problem above with deficits below. PTA patient was just home from short-term rehab and was independent with mobility and ADLs. Pt currently requires minguard assist with RW. Anticipate patient will benefit from PT to address problems listed below.Will continue to follow acutely to maximize functional mobility independence and safety.       Follow Up Recommendations Home health PT    Equipment Recommendations  None recommended by PT    Recommendations for Other Services       Precautions / Restrictions Precautions Precautions: Fall      Mobility  Bed Mobility Overal bed mobility: Modified Independent             General bed mobility comments: assist with lines only    Transfers Overall transfer level: Modified independent Equipment used: None             General transfer comment: repeated x 10 for strengthening  Ambulation/Gait Ambulation/Gait assistance: Min guard Gait Distance (Feet): 370 Feet Assistive device: Rolling walker (2 wheeled) Gait Pattern/deviations: Step-through pattern   Gait velocity interpretation: >2.62 ft/sec, indicative of community ambulatory General Gait Details: excellent pace  Stairs            Wheelchair Mobility    Modified Rankin (Stroke Patients Only)       Balance Overall balance assessment: Independent (able to stand feet together x 30 sec; reach overhead with RUE, shoulder height bil UEs)                                            Pertinent Vitals/Pain Pain Assessment: No/denies pain    Home Living Family/patient expects to be discharged to:: Private residence Living Arrangements: Alone Available Help at Discharge: Friend(s);Available PRN/intermittently Type of Home: House Home Access: Level entry     Home Layout: One level Home Equipment: None      Prior Function Level of Independence: Independent         Comments: returned home from SNF ~1 week PTA. Was completely independent     Hand Dominance   Dominant Hand: Right    Extremity/Trunk Assessment   Upper Extremity Assessment Upper Extremity Assessment: Overall WFL for tasks assessed    Lower Extremity Assessment Lower Extremity Assessment: Overall WFL for tasks assessed    Cervical / Trunk Assessment Cervical / Trunk Assessment: Normal  Communication   Communication: No difficulties  Cognition Arousal/Alertness: Awake/alert Behavior During Therapy: WFL for tasks assessed/performed Overall Cognitive Status: Within Functional Limits for tasks assessed                                        General Comments      Exercises Other Exercises Other Exercises: sit to stand x 10   Assessment/Plan    PT Assessment Patient needs continued PT services  PT Problem List Decreased mobility;Decreased knowledge of use of DME       PT Treatment Interventions DME instruction;Gait training;Functional mobility training;Therapeutic activities;Therapeutic exercise;Patient/family education    PT Goals (Current goals can be found in the Care Plan section)  Acute Rehab PT Goals Patient Stated Goal: return home PT Goal Formulation: With patient Time For Goal Achievement: 09/06/20 Potential to Achieve Goals: Good    Frequency Min 3X/week   Barriers to discharge Decreased caregiver support      Co-evaluation               AM-PAC PT "6 Clicks" Mobility  Outcome Measure Help needed turning from your back to  your side while in a flat bed without using bedrails?: None Help needed moving from lying on your back to sitting on the side of a flat bed without using bedrails?: None Help needed moving to and from a bed to a chair (including a wheelchair)?: None Help needed standing up from a chair using your arms (e.g., wheelchair or bedside chair)?: None Help needed to walk in hospital room?: A Little Help needed climbing 3-5 steps with a railing? : A Little 6 Click Score: 22    End of Session   Activity Tolerance: Patient tolerated treatment well Patient left: in bed;with call bell/phone within reach;with nursing/sitter in room Nurse Communication: Mobility status PT Visit Diagnosis: Difficulty in walking, not elsewhere classified (R26.2)    Time: 3212-2482 PT Time Calculation (min) (ACUTE ONLY): 32 min   Charges:   PT Evaluation $PT Eval Moderate Complexity: 1 Mod PT Treatments $Gait Training: 8-22 mins         Arby Barrette, PT Pager [phone]   Rexanne Mano 08/23/2020, 1:27 PM

## 2020-08-23 NOTE — Progress Notes (Signed)
Subjective:   Transferred to 2 H after right heart cath-  overall improving - hypothermic but SBP over 100- at least 700 of UOP and crt down quite a bit -  quite a bit of oozing from temp HD cath   Objective Vital signs in last 24 hours: Vitals:   08/23/20 0400 08/23/20 0500 08/23/20 0600 08/23/20 0700  BP: 106/63 114/89 133/84 119/71  Pulse: (!) 48 75 94 (!) 107  Resp: (!) 21 (!) _0 Temp: (!) 97.2 F (36.2 C) (!) 97 F (36.1 C) (!) 97.3 F (36.3 C) (!) 97.2 F (36.2 C)  TempSrc:      SpO2: 98% 99% 96% 100%  Weight: 71.8 kg     Height:       Weight change: 2.626 kg  Intake/Output Summary (Last 24 hours) at 08/23/2020 1610 Last data filed at 08/23/2020 0400 Gross per 24 hour  Intake 971.86 ml  Output 700 ml  Net 271.86 ml    Assessment/ Plan: Pt is a 70 y.o. yo male with CAD, cardiomyopathy, HTN, paroxysmal A. fib, COPD, CKD 3b and T2DM who was admitted on 08/20/2020 with acute on chronic systolic heart failure. Nephrology consulted for acute on chronic renal failure.  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 Baseline creatinine around 2.0. Found to have elevated BUN/sCr of 81/3.93 on admission. Kidney function worsened initially but now improving, sCr 4.34->3.70--> 2.58, UOP reasonable with good volume status.  S/p placement of non-tunneled HD catheter 7/22 but not needed.  -- Continue Milrinone to help with forward flow             -- Continue to hold spironolactone, torsemide and Farxiga for now             -- Trend BMP, electrolyte             -- Strict I&O, daily weight 2. Hypertension/volume - Patient found to have a borderline low BP on admission.  Started on midodrine with some improvement. BP still 100-120s/60-70s.              -- Continue holding antihypertensives as above             -- Continue midodrine 5 mg 3 times daily 4. Anemia (IDA) -iron studies 2 months ago showed iron 22, iron sat 8%, ferritin 42.  Hemoglobin normal at  14.7 on admission. Hgb over 11 so no action yet 5.  Acute on chronic systolic HF: Thought to be due to low output HF. Management per cardiology-  planning for a cardioversion on Monday 6.  CAD s/p DES to Lcx in 3/19 -continue home atorvastatin and Plavix.  7.  A. Fib on Eliquis: On amiodarone for rate control. Remains in A. Fib with HR in the 70s to 80s. Back on Eliquis today. Management per cardiology- cardioversion Monday 8. Mild Hypokalemia-  repleting gently   Louis Meckel      Labs: Basic Metabolic Panel: Recent Labs  Lab 08/21/20 0538 08/21/20 1344 08/22/20 0259 08/22/20 1215 08/22/20 1219 08/23/20 0446  NA 134*  --  133* 137 140 135  K 4.2  --  3.6 3.7 3.4* 3.6  CL 101  --  102  --   --  108  CO2 20*  --  22  --   --  21*  GLUCOSE 167*  --  178*  --   --  109*  BUN 96*  --  84*  --   --  57*  CREATININE 4.34*  --  3.70*  --   --  2.58*  CALCIUM 9.0  --  8.7*  --   --  8.2*  PHOS  --  3.4  --   --   --   --    Liver Function Tests: No results for input(s): AST, ALT, ALKPHOS, BILITOT, PROT, ALBUMIN in the last 168 hours. No results for input(s): LIPASE, AMYLASE in the last 168 hours. No results for input(s): AMMONIA in the last 168 hours. CBC: Recent Labs  Lab 08/20/20 1002 08/20/20 1654 08/22/20 0259 08/22/20 1215 08/22/20 1219 08/23/20 0446  WBC 6.9 5.3 5.1  --   --  4.2  HGB 16.3 14.7 12.5* 11.9* 11.9* 11.8*  HCT 52.0 45.7 36.4* 35.0* 35.0* 36.0*  MCV 88.9 88.1 82.9  --   --  86.3  PLT 253 245 192  --   --  149*   Cardiac Enzymes: No results for input(s): CKTOTAL, CKMB, CKMBINDEX, TROPONINI in the last 168 hours. CBG: Recent Labs  Lab 08/21/20 2052 08/21/20 2054 08/22/20 0555 08/22/20 1106 08/22/20 1640  GLUCAP 51* 153* 136* 145* 214*    Iron Studies: No results for input(s): IRON, TIBC, TRANSFERRIN, FERRITIN in the last 72 hours. Studies/Results: CARDIAC CATHETERIZATION  Result Date: 08/22/2020 1. Low filling pressures. 2. Marginal  thermodilution cardiac output, preserved Fick cardiac output on 0.25 mcg/kg/min milrinone.   IR Fluoro Guide CV Line Right  Result Date: 08/22/2020 INDICATION: 70 year old male with CKD, requiring HD initiation EXAM: NON-TUNNELED CENTRAL VENOUS HEMODIALYSIS CATHETER PLACEMENT WITH ULTRASOUND AND FLUOROSCOPIC GUIDANCE COMPARISON:  Chest radiograph, 08/21/2020. MEDICATIONS: None FLUOROSCOPY TIME:  0 minutes, 6 seconds (0 mGy) COMPLICATIONS: None immediate. PROCEDURE: Informed written consent was obtained from the patient after a discussion of the risks, benefits, and alternatives to treatment. Questions regarding the procedure were encouraged and answered. The right neck and chest were prepped with chlorhexidine in a sterile fashion, and a sterile drape was applied covering the operative field. Maximum barrier sterile technique with sterile gowns and gloves were used for the procedure. A timeout was performed prior to the initiation of the procedure. After the overlying soft tissues were anesthetized, a small venotomy incision was created and a micropuncture kit was utilized to access the internal jugular vein. Real-time ultrasound guidance was utilized for vascular access including the acquisition of a permanent ultrasound image documenting patency of the accessed vessel. The microwire was utilized to measure appropriate catheter length. A stiff glidewire was advanced to the level of the IVC. Under fluoroscopic guidance, the venotomy was serially dilated, ultimately allowing placement of a 15 cm temporary Trialysis catheter with tip ultimately terminating within the superior aspect of the right atrium. Final catheter positioning was confirmed and documented with a spot radiographic image. The catheter aspirates and flushes normally. The catheter was flushed with appropriate volume heparin dwells. The catheter exit site was secured with a 2-0 nylon retention suture. A dressing was placed. The patient tolerated the  procedure well without immediate post procedural complication. IMPRESSION: Successful placement of a right internal jugular approach 15 cm temporary dialysis catheter with tip terminating with in the superior aspect of the right atrium. The catheter is ready for immediate use. PLAN: This catheter may be converted to a tunneled dialysis catheter at a later date as indicated. CLINICAL DATA:  70 year old male with chronic kidney disease, requiring hemodialysis initiation. Electronically Signed   By: Michaelle Birks MD   On: 08/22/2020 15:22   ECHOCARDIOGRAM COMPLETE  Result Date: 08/21/2020    ECHOCARDIOGRAM REPORT   Patient Name:   Douglas Villa Date of Exam: 08/21/2020 Medical Rec #:  [id number]         Height:       67.0 in Accession #:    [id number]        Weight:       155.2 lb Date of Birth:  10/20/1950          BSA:          1.816 m Patient Age:    89 years          BP:           118/79 mmHg Patient Gender: M                 HR:           103 bpm. Exam Location:  Inpatient Procedure: 2D Echo, Cardiac Doppler and Color Doppler Indications:    I71.24 Chronic systolic (congestive) heart failure  History:        Patient has prior history of Echocardiogram examinations, most                 recent 06/27/2020. CAD; Risk Factors:Hypertension, Diabetes and                 Sleep Apnea. CKD.  Sonographer:    Jonelle Sidle Dance Referring Phys: [place]  1. Left ventricular ejection fraction, by estimation, is 25%. The left ventricle has severely decreased function. The left ventricle demonstrates regional wall motion abnormalities (apical akinesis without evidence of LV thrombus). There is mild concentric left ventricular hypertrophy. Left ventricular diastolic parameters are consistent with Grade III diastolic dysfunction (restrictive).  2. Right ventricular systolic function is normal. The right ventricular size is normal. There is normal pulmonary artery systolic pressure. The estimated right  ventricular systolic pressure is 58.0 mmHg.  3. Left atrial size was moderately dilated.  4. The mitral valve is abnormal. Mild mitral valve regurgitation. No evidence of mitral stenosis.  5. The aortic valve is tricuspid. There is mild calcification of the aortic valve. Aortic valve regurgitation is mild. Aortic valve sclerosis is present, with no evidence of aortic valve stenosis. Aortic regurgitation PHT measures 626 msec.  6. The inferior vena cava is normal in size with greater than 50% respiratory variability, suggesting right atrial pressure of 3 mmHg. Comparison(s): A prior study was performed on 06/27/20. Prior images reviewed side by side. RV function continues to improve. FINDINGS  Left Ventricle: Left ventricular ejection fraction, by estimation, is 25%. The left ventricle has severely decreased function. The left ventricle demonstrates regional wall motion abnormalities. The left ventricular internal cavity size was normal in size. There is mild concentric left ventricular hypertrophy. Left ventricular diastolic parameters are consistent with Grade III diastolic dysfunction (restrictive).  LV Wall Scoring: The apical lateral segment, apical septal segment, apical anterior segment, and apical inferior segment are akinetic. Right Ventricle: The right ventricular size is normal. No increase in right ventricular wall thickness. Right ventricular systolic function is normal. There is normal pulmonary artery systolic pressure. The tricuspid regurgitant velocity is 2.43 m/s, and  with an assumed right atrial pressure of 3 mmHg, the estimated right ventricular systolic pressure is 99.8 mmHg. Left Atrium: Left atrial size was moderately dilated. Right Atrium: Right atrial size was normal in size. Pericardium: There is no evidence of pericardial effusion. Mitral Valve: The mitral valve is abnormal. There is mild thickening  of the mitral valve leaflet(s). There is mild calcification of the mitral valve leaflet(s).  Mild mitral valve regurgitation. No evidence of mitral valve stenosis. Tricuspid Valve: The tricuspid valve is normal in structure. Tricuspid valve regurgitation is mild . No evidence of tricuspid stenosis. Aortic Valve: The aortic valve is tricuspid. There is mild calcification of the aortic valve. Aortic valve regurgitation is mild. Aortic regurgitation PHT measures 626 msec. Mild aortic valve sclerosis is present, with no evidence of aortic valve stenosis. Pulmonic Valve: The pulmonic valve was normal in structure. Pulmonic valve regurgitation is not visualized. Aorta: The aortic root and ascending aorta are structurally normal, with no evidence of dilitation when indexed to age and body surface area. Venous: The inferior vena cava is normal in size with greater than 50% respiratory variability, suggesting right atrial pressure of 3 mmHg. IAS/Shunts: No atrial level shunt detected by color flow Doppler.  LEFT VENTRICLE PLAX 2D LVIDd:         4.85 cm  Diastology LVIDs:         4.80 cm  LV e' medial:    7.83 cm/s LV PW:         1.55 cm  LV E/e' medial:  12.9 LV IVS:        1.25 cm  LV e' lateral:   9.25 cm/s LVOT diam:     2.20 cm  LV E/e' lateral: 10.9 LV SV:         48 LV SV Index:   26 LVOT Area:     3.80 cm  RIGHT VENTRICLE             IVC RV Basal diam:  2.90 cm     IVC diam: 1.50 cm RV S prime:     11.30 cm/s TAPSE (M-mode): 1.6 cm LEFT ATRIUM             Index       RIGHT ATRIUM           Index LA diam:        4.80 cm 2.64 cm/m  RA Area:     15.20 cm LA Vol (A2C):   88.5 ml 48.74 ml/m RA Volume:   36.00 ml  19.83 ml/m LA Vol (A4C):   77.9 ml 42.90 ml/m LA Biplane Vol: 84.9 ml 46.76 ml/m  AORTIC VALVE LVOT Vmax:   88.60 cm/s LVOT Vmean:  56.450 cm/s LVOT VTI:    0.126 m AI PHT:      626 msec  AORTA Ao Root diam: 3.90 cm Ao Asc diam:  3.10 cm MITRAL VALVE                TRICUSPID VALVE MV Area (PHT): 5.02 cm     TR Peak grad:   23.6 mmHg MV Decel Time: 151 msec     TR Vmax:        243.00 cm/s MV E  velocity: 101.00 cm/s MV A velocity: 26.70 cm/s   SHUNTS MV E/A ratio:  3.78         Systemic VTI:  0.13 m                             Systemic Diam: 2.20 cm Rudean Haskell MD Electronically signed by Rudean Haskell MD Signature Date/Time: 08/21/2020/11:49:26 AM    Final    Medications: Infusions:  sodium chloride     milrinone 0.25 mcg/kg/min (08/23/20 0400)    Scheduled  Medications:  amiodarone  200 mg Oral BID   apixaban  5 mg Oral BID   atorvastatin  80 mg Oral Daily   Chlorhexidine Gluconate Cloth  6 each Topical Daily   clopidogrel  75 mg Oral Daily   midodrine  5 mg Oral TID WC   pantoprazole  40 mg Oral Q1200   potassium chloride  20 mEq Oral Once   sodium chloride flush  3 mL Intravenous Q12H   sodium chloride flush  3 mL Intravenous Q12H    have reviewed scheduled and prn medications.  Physical Exam: General: Pleasant, well-appearing man laying in bed. No acute distress. Chest: non-TDC on R chest.  CV: Regular rate. Irregular rhythm. No murmurs, rubs, or gallops. No LE edema Pulmonary: Lungs CTAB. Normal effort. No wheezing or rales. Abdominal: Soft, nontender, nondistended. Normal bowel sounds. Extremities: Palpable radial and DP pulses. Normal ROM. Mild asterixis. Skin: Warm and dry. No obvious rash or lesions. Neuro: A&Ox3. Moves all extremities. Normal sensation. No focal deficit.    08/23/2020,7:38 AM  LOS: 3 days

## 2020-08-23 NOTE — Plan of Care (Signed)
  Problem: Education: Goal: Ability to demonstrate management of disease process will improve Outcome: Progressing Goal: Ability to verbalize understanding of medication therapies will improve Outcome: Progressing Goal: Individualized Educational Video(s) Outcome: Progressing   Problem: Activity: Goal: Capacity to carry out activities will improve Outcome: Progressing   Problem: Cardiac: Goal: Ability to achieve and maintain adequate cardiopulmonary perfusion will improve Outcome: Progressing   Problem: Education: Goal: Knowledge of General Education information will improve Description: Including pain rating scale, medication(s)/side effects and non-pharmacologic comfort measures Outcome: Progressing   Problem: Health Behavior/Discharge Planning: Goal: Ability to manage health-related needs will improve Outcome: Progressing

## 2020-08-24 LAB — COOXEMETRY PANEL
Carboxyhemoglobin: 1 % (ref 0.5–1.5)
Methemoglobin: 0.7 % (ref 0.0–1.5)
O2 Saturation: 68.1 %
Total hemoglobin: 11.4 g/dL — ABNORMAL LOW (ref 12.0–16.0)

## 2020-08-24 LAB — BASIC METABOLIC PANEL
Anion gap: 8 (ref 5–15)
BUN: 40 mg/dL — ABNORMAL HIGH (ref 8–23)
CO2: 20 mmol/L — ABNORMAL LOW (ref 22–32)
Calcium: 8.2 mg/dL — ABNORMAL LOW (ref 8.9–10.3)
Chloride: 108 mmol/L (ref 98–111)
Creatinine, Ser: 2.28 mg/dL — ABNORMAL HIGH (ref 0.61–1.24)
GFR, Estimated: 30 mL/min — ABNORMAL LOW (ref >60)
Glucose, Bld: 121 mg/dL — ABNORMAL HIGH (ref 70–99)
Potassium: 3.9 mmol/L (ref 3.5–5.1)
Sodium: 136 mmol/L (ref 135–145)

## 2020-08-24 LAB — CBC
HCT: 33.3 % — ABNORMAL LOW (ref 39.0–52.0)
Hemoglobin: 10.9 g/dL — ABNORMAL LOW (ref 13.0–17.0)
MCH: 28.5 pg (ref 26.0–34.0)
MCHC: 32.7 g/dL (ref 30.0–36.0)
MCV: 87.2 fL (ref 80.0–100.0)
Platelets: 140 10*3/uL — ABNORMAL LOW (ref 150–400)
RBC: 3.82 MIL/uL — ABNORMAL LOW (ref 4.22–5.81)
RDW: 17.3 % — ABNORMAL HIGH (ref 11.5–15.5)
WBC: 4.5 10*3/uL (ref 4.0–10.5)
nRBC: 0 % (ref 0.0–0.2)

## 2020-08-24 LAB — GLUCOSE, CAPILLARY
Glucose-Capillary: 104 mg/dL — ABNORMAL HIGH (ref 70–99)
Glucose-Capillary: 122 mg/dL — ABNORMAL HIGH (ref 70–99)
Glucose-Capillary: 156 mg/dL — ABNORMAL HIGH (ref 70–99)
Glucose-Capillary: 159 mg/dL — ABNORMAL HIGH (ref 70–99)

## 2020-08-24 NOTE — Progress Notes (Signed)
Patient ID: Douglas Villa, male   DOB: 08/16/1950, 70 y.o.   MRN: [id number] P    Advanced Heart Failure Rounding Note  PCP-Cardiologist: Dr. Aundra Dubin   Subjective:    07/20: Admit with malaise, N/V, fatigue and low output HF w/ AKI on CKD. Started milrinone 0.25.  07/21: Nephrology consulted. Considering HD. Diuretics held.  Creatinine 4.34 > 3.70 > 2.58 > 2.28  Feeling better, more energy.  Walked with PT yesterday.   Swan numbers: RA 4 PA 47/23 CI 2.0 Co-ox 68%   Echo 08/21/2020: EF 25%, apical akinesis, GIIDD, RV okay,   RVSP 26 mmHg, mild MR, mild AI  RHC Procedural Findings on milrinone 0.25: Hemodynamics (mmHg) RA mean 1 RV 25/1 PA 28/9, mean 16 PCWP mean 3 Oxygen saturations: PA 65% AO 99% Cardiac Output (Fick) 4.14  Cardiac Index (Fick) 2.3 Cardiac Output (Thermo) 3.72  Cardiac Index (Thermo) 2.07  Objective:   Weight Range: 75.2 kg Body mass index is 25.97 kg/m.   Vital Signs:   Temp:  [97.16 F (36.2 C)-98.1 F (36.7 C)] 97.3 F (36.3 C) (07/24 0700) Pulse Rate:  [49-106] 62 (07/24 0700) Resp:  [14-35] 24 (07/24 0700) BP: (104-137)/(56-89) 111/73 (07/24 0700) SpO2:  [94 %-100 %] 100 % (07/24 0700) Weight:  [75.2 kg] 75.2 kg (07/24 0500) Last BM Date: 08/16/20  Weight change: Filed Weights   08/22/20 0514 08/23/20 0400 08/24/20 0500  Weight: 69.2 kg 71.8 kg 75.2 kg    Intake/Output:   Intake/Output Summary (Last 24 hours) at 08/24/2020 0746 Last data filed at 08/24/2020 0300 Gross per 24 hour  Intake 252.45 ml  Output 1350 ml  Net -1097.55 ml      Physical Exam    General: NAD Neck: No JVD, no thyromegaly or thyroid nodule.  Lungs: Clear to auscultation bilaterally with normal respiratory effort. CV: Nondisplaced PMI.  Heart irregular S1/S2, no S3/S4, no murmur.  No peripheral edema.    Abdomen: Soft, nontender, no hepatosplenomegaly, no distention.  Skin: Intact without lesions or rashes.  Neurologic: Alert and oriented x 3.   Psych: Normal affect. Extremities: No clubbing or cyanosis.  HEENT: Normal.    Telemetry   Atrial flutter, 70s-80s (personally reviewed)  Labs    CBC Recent Labs    08/23/20 0446 08/24/20 0419  WBC 4.2 4.5  HGB 11.8* 10.9*  HCT 36.0* 33.3*  MCV 86.3 87.2  PLT 149* 132*   Basic Metabolic Panel Recent Labs    08/21/20 1344 08/22/20 0259 08/23/20 0446 08/24/20 0419  NA  --    < > 135 136  K  --    < > 3.6 3.9  CL  --    < > 108 108  CO2  --    < > 21* 20*  GLUCOSE  --    < > 109* 121*  BUN  --    < > 57* 40*  CREATININE  --    < > 2.58* 2.28*  CALCIUM  --    < > 8.2* 8.2*  MG 2.8*  --   --   --   PHOS 3.4  --   --   --    < > = values in this interval not displayed.   Liver Function Tests No results for input(s): AST, ALT, ALKPHOS, BILITOT, PROT, ALBUMIN in the last 72 hours. No results for input(s): LIPASE, AMYLASE in the last 72 hours. Cardiac Enzymes No results for input(s): CKTOTAL, CKMB, CKMBINDEX, TROPONINI in the last  72 hours.  BNP: BNP (last 3 results) Recent Labs    07/22/20 0109 07/23/20 0436 08/20/20 1002  BNP 2,071.8* 1,879.9* 338.1*    ProBNP (last 3 results) No results for input(s): PROBNP in the last 8760 hours.   D-Dimer No results for input(s): DDIMER in the last 72 hours. Hemoglobin A1C No results for input(s): HGBA1C in the last 72 hours. Fasting Lipid Panel No results for input(s): CHOL, HDL, LDLCALC, TRIG, CHOLHDL, LDLDIRECT in the last 72 hours. Thyroid Function Tests No results for input(s): TSH, T4TOTAL, T3FREE, THYROIDAB in the last 72 hours.  Invalid input(s): FREET3  Other results:   Imaging    No results found.   Medications:     Scheduled Medications:  amiodarone  200 mg Oral BID   apixaban  5 mg Oral BID   atorvastatin  80 mg Oral Daily   Chlorhexidine Gluconate Cloth  6 each Topical Daily   clopidogrel  75 mg Oral Daily   insulin aspart  0-9 Units Subcutaneous TID WC   midodrine  5 mg Oral TID WC    pantoprazole  40 mg Oral Q1200   sodium chloride flush  3 mL Intravenous Q12H   sodium chloride flush  3 mL Intravenous Q12H    Infusions:  sodium chloride 10 mL/hr at 08/24/20 0300   milrinone 0.25 mcg/kg/min (08/24/20 0300)    PRN Medications: sodium chloride, acetaminophen, lidocaine (PF), sodium chloride flush    Assessment/Plan   1. CAD: H/o DES to LCX in 3/19, had occluded PLV at that time. Admitted with anterior STEMI 5/22 and occluded mLAD, had DES to mLAD, known CTO PLV with collaterals and patent LCx stent.  No chest pain now.  - Continue Plavix, no ASA with apixaban use.  - Continue atorvastatin 80. 2. AKI on CKD IIIb: Creatinine 3.93>4.34>3.70>2.58>2.28.  Suspect combination of low output and low volume status, improving with milrinone and gentle rehydration.. - Holding torsemide, spironolactone, Farxiga, hydralazine/Imdur. - Continue milrinone for now.  - Continue midodrine 5 mg TID - Holding off on HD with improved creatinine.  3. 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  TEE 05/22 showing EF 20-25%, moderately decreased RV function with mild RVE, moderate biatrial enlargement, PASP 34, mild MR, mild TR.  Cardiogenic shock during 5/22 admission, was eventually able to titrate off IABP and milrinone.  He has struggled since discharge, admitted this time with with AKI and prominent fatigue, cool extremities, low output HF. Echo this admit with EF 25%, RV okay, mild MR, mild AI.  Milrinone 0.25 started, Swan placed.  This morning, CI 2.0 with co-ox 68% on milrinone 0.25.  CVP 4, creatinine continues to improve.   - Think we can hold off on further IVF and encourage po hydration. No diuretic yet.  PA pressure rising so will likely need soon.  - Stopped torsemide, spironolactone, Farxiga, hydralazine/imdur - Continue midodrine 5 mg TID - Off digoxin with recent AKI and  high level/toxicity. - Continue milrinone 0.25 mcg/kg/min.  - Concerning clinical picture with suspected recurrent low output HF.  Not LVAD candidate at this time with AKI.  4. COPD: Prior smoker 5. Atrial flutter: Atypical, had DCCV in 5/22 but now back in atrial flutter.  Rate controlled.   - Continue amiodarone 200 mg bid.  - Resumed apixaban post RHC - TEE-guided DCCV on Monday.  Long-term prognosis guarded, will follow course.   CRITICAL CARE  Performed by: Loralie Champagne  Total critical care time: 35 minutes  Critical care time was exclusive of separately billable procedures and treating other patients.  Critical care was necessary to treat or prevent imminent or life-threatening deterioration.  Critical care was time spent personally by me on the following activities: development of treatment plan with patient and/or surrogate as well as nursing, discussions with consultants, evaluation of patient's response to treatment, examination of patient, obtaining history from patient or surrogate, ordering and performing treatments and interventions, ordering and review of laboratory studies, ordering and review of radiographic studies, pulse oximetry and re-evaluation of patient's condition.   Loralie Champagne 08/24/2020 7:46 AM

## 2020-08-24 NOTE — Plan of Care (Signed)

## 2020-08-24 NOTE — Progress Notes (Signed)
Subjective:   -  overall continuing to improve - hypothermic but SBP around 130- at least 1300 of UOP and crt down again -  oozing from temp HD cath improved   Objective Vital signs in last 24 hours: Vitals:   08/24/20 0400 08/24/20 0500 08/24/20 0600 08/24/20 0700  BP: 136/89 120/85 136/77 111/73  Pulse: 82 72 77 62  Resp: 16 (!) 21 19 (!) 24  Temp: 97.7 F (36.5 C) (!) 97.5 F (36.4 C) (!) 97.3 F (36.3 C) (!) 97.3 F (36.3 C)  TempSrc:      SpO2: 99% 98% 100% 100%  Weight:  75.2 kg    Height:       Weight change: 3.4 kg  Intake/Output Summary (Last 24 hours) at 08/24/2020 0724 Last data filed at 08/24/2020 0300 Gross per 24 hour  Intake 252.45 ml  Output 1350 ml  Net -1097.55 ml    Assessment/ Plan: Pt is a 70 y.o. yo male with CAD, cardiomyopathy, HTN, paroxysmal A. fib, COPD, CKD 3b and T2DM who was admitted on 08/20/2020 with acute on chronic systolic heart failure. Nephrology consulted for acute on chronic renal failure.  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 Baseline creatinine around 2.0. Found to have elevated BUN/sCr of 81/3.93 on admission. Kidney function worsened initially but now improving, sCr 4.34->3.70--> 2.58, UOP reasonable with good volume status.  S/p placement of non-tunneled HD catheter 7/22 but not needed. Would leave in for now -- Continue Milrinone to help with forward flow             -- Continue to hold spironolactone, torsemide and Farxiga for now             -- Trend BMP, electrolyte             -- Strict I&O, daily weight 2. Hypertension/volume - Patient found to have a borderline low BP on admission.  Started on midodrine with improvement. BP around 130's             -- Continue holding antihypertensives as above             -- Continue midodrine 5 mg 3 times daily 4. Anemia (IDA) -iron studies 2 months ago showed iron 22, iron sat 8%, ferritin 42.  Hemoglobin normal at 14.7 on admission. Hgb over 11 so no action  yet 5.  Acute on chronic systolic HF: Thought to be due to low output HF. Management per cardiology-  planning for a cardioversion on Monday 6.  CAD s/p DES to Lcx in 3/19 -continue home atorvastatin and Plavix.  7.  A. Fib on Eliquis: On amiodarone for rate control. Remains in A. Fib with HR in the 70s to 80s. Back on Eliquis today. Management per cardiology- cardioversion Monday 8. Mild Hypokalemia-  repleting gently PRN  Louis Meckel      Labs: Basic Metabolic Panel: Recent Labs  Lab 08/21/20 1344 08/22/20 0259 08/22/20 1215 08/22/20 1219 08/23/20 0446 08/24/20 0419  NA  --  133*   < > 140 135 136  K  --  3.6   < > 3.4* 3.6 3.9  CL  --  102  --   --  108 108  CO2  --  22  --   --  21* 20*  GLUCOSE  --  178*  --   --  109* 121*  BUN  --  84*  --   --  57* 40*  CREATININE  --  3.70*  --   --  2.58* 2.28*  CALCIUM  --  8.7*  --   --  8.2* 8.2*  PHOS 3.4  --   --   --   --   --    < > = values in this interval not displayed.   Liver Function Tests: No results for input(s): AST, ALT, ALKPHOS, BILITOT, PROT, ALBUMIN in the last 168 hours. No results for input(s): LIPASE, AMYLASE in the last 168 hours. No results for input(s): AMMONIA in the last 168 hours. CBC: Recent Labs  Lab 08/20/20 1002 08/20/20 1654 08/22/20 0259 08/22/20 1215 08/22/20 1219 08/23/20 0446 08/24/20 0419  WBC 6.9 5.3 5.1  --   --  4.2 4.5  HGB 16.3 14.7 12.5*   < > 11.9* 11.8* 10.9*  HCT 52.0 45.7 36.4*   < > 35.0* 36.0* 33.3*  MCV 88.9 88.1 82.9  --   --  86.3 87.2  PLT 253 245 192  --   --  149* 140*   < > = values in this interval not displayed.   Cardiac Enzymes: No results for input(s): CKTOTAL, CKMB, CKMBINDEX, TROPONINI in the last 168 hours. CBG: Recent Labs  Lab 08/22/20 1640 08/23/20 1619 08/23/20 1938 08/23/20 2334 08/24/20 0558  GLUCAP 214* 87 119* 207* 104*    Iron Studies: No results for input(s): IRON, TIBC, TRANSFERRIN, FERRITIN in the last 72  hours. Studies/Results: CARDIAC CATHETERIZATION  Result Date: 08/22/2020 1. Low filling pressures. 2. Marginal thermodilution cardiac output, preserved Fick cardiac output on 0.25 mcg/kg/min milrinone.   IR Fluoro Guide CV Line Right  Result Date: 08/22/2020 INDICATION: 70 year old male with CKD, requiring HD initiation EXAM: NON-TUNNELED CENTRAL VENOUS HEMODIALYSIS CATHETER PLACEMENT WITH ULTRASOUND AND FLUOROSCOPIC GUIDANCE COMPARISON:  Chest radiograph, 08/21/2020. MEDICATIONS: None FLUOROSCOPY TIME:  0 minutes, 6 seconds (0 mGy) COMPLICATIONS: None immediate. PROCEDURE: Informed written consent was obtained from the patient after a discussion of the risks, benefits, and alternatives to treatment. Questions regarding the procedure were encouraged and answered. The right neck and chest were prepped with chlorhexidine in a sterile fashion, and a sterile drape was applied covering the operative field. Maximum barrier sterile technique with sterile gowns and gloves were used for the procedure. A timeout was performed prior to the initiation of the procedure. After the overlying soft tissues were anesthetized, a small venotomy incision was created and a micropuncture kit was utilized to access the internal jugular vein. Real-time ultrasound guidance was utilized for vascular access including the acquisition of a permanent ultrasound image documenting patency of the accessed vessel. The microwire was utilized to measure appropriate catheter length. A stiff glidewire was advanced to the level of the IVC. Under fluoroscopic guidance, the venotomy was serially dilated, ultimately allowing placement of a 15 cm temporary Trialysis catheter with tip ultimately terminating within the superior aspect of the right atrium. Final catheter positioning was confirmed and documented with a spot radiographic image. The catheter aspirates and flushes normally. The catheter was flushed with appropriate volume heparin dwells. The  catheter exit site was secured with a 2-0 nylon retention suture. A dressing was placed. The patient tolerated the procedure well without immediate post procedural complication. IMPRESSION: Successful placement of a right internal jugular approach 15 cm temporary dialysis catheter with tip terminating with in the superior aspect of the right atrium. The catheter is ready for immediate use. PLAN: This catheter may be converted to a tunneled dialysis catheter at a  later date as indicated. CLINICAL DATA:  70 year old male with chronic kidney disease, requiring hemodialysis initiation. Electronically Signed   By: Michaelle Birks MD   On: 08/22/2020 15:22   Medications: Infusions:  sodium chloride 10 mL/hr at 08/24/20 0300   milrinone 0.25 mcg/kg/min (08/24/20 0300)    Scheduled Medications:  amiodarone  200 mg Oral BID   apixaban  5 mg Oral BID   atorvastatin  80 mg Oral Daily   Chlorhexidine Gluconate Cloth  6 each Topical Daily   clopidogrel  75 mg Oral Daily   insulin aspart  0-9 Units Subcutaneous TID WC   midodrine  5 mg Oral TID WC   pantoprazole  40 mg Oral Q1200   sodium chloride flush  3 mL Intravenous Q12H   sodium chloride flush  3 mL Intravenous Q12H    have reviewed scheduled and prn medications.  Physical Exam: General: Pleasant, well-appearing man laying in bed. No acute distress. Chest: non-TDC on R chest.  CV: Regular rate. Irregular rhythm. No murmurs, rubs, or gallops. No LE edema Pulmonary: Lungs CTAB. Normal effort. No wheezing or rales. Abdominal: Soft, nontender, nondistended. Normal bowel sounds. Extremities: Palpable radial and DP pulses. Normal ROM. Mild asterixis. Skin: Warm and dry. No obvious rash or lesions. Neuro: A&Ox3. Moves all extremities. Normal sensation. No focal deficit.    08/24/2020,7:24 AM  LOS: 4 days

## 2020-08-25 ENCOUNTER — Inpatient Hospital Stay (HOSPITAL_COMMUNITY): Payer: Medicare Other

## 2020-08-25 ENCOUNTER — Inpatient Hospital Stay (HOSPITAL_COMMUNITY): Payer: Medicare Other | Admitting: Certified Registered Nurse Anesthetist

## 2020-08-25 ENCOUNTER — Encounter (HOSPITAL_COMMUNITY): Payer: Self-pay

## 2020-08-25 ENCOUNTER — Encounter (HOSPITAL_COMMUNITY)
Admission: EM | Disposition: A | Discharge status: Home Health | Payer: Self-pay | Source: Ambulatory Visit | Attending: Cardiology

## 2020-08-25 HISTORY — PX: CARDIOVERSION: SHX1299

## 2020-08-25 HISTORY — PX: TEE WITHOUT CARDIOVERSION: SHX5443

## 2020-08-25 LAB — CBC
HCT: 34 % — ABNORMAL LOW (ref 39.0–52.0)
Hemoglobin: 10.9 g/dL — ABNORMAL LOW (ref 13.0–17.0)
MCH: 28.2 pg (ref 26.0–34.0)
MCHC: 32.1 g/dL (ref 30.0–36.0)
MCV: 88.1 fL (ref 80.0–100.0)
Platelets: 148 10*3/uL — ABNORMAL LOW (ref 150–400)
RBC: 3.86 MIL/uL — ABNORMAL LOW (ref 4.22–5.81)
RDW: 17.4 % — ABNORMAL HIGH (ref 11.5–15.5)
WBC: 4.3 10*3/uL (ref 4.0–10.5)
nRBC: 0 % (ref 0.0–0.2)

## 2020-08-25 LAB — PROTIME-INR
INR: 1.4 — ABNORMAL HIGH (ref 0.8–1.2)
Prothrombin Time: 17.4 seconds — ABNORMAL HIGH (ref 11.4–15.2)

## 2020-08-25 LAB — BASIC METABOLIC PANEL
Anion gap: 7 (ref 5–15)
BUN: 26 mg/dL — ABNORMAL HIGH (ref 8–23)
CO2: 20 mmol/L — ABNORMAL LOW (ref 22–32)
Calcium: 7.8 mg/dL — ABNORMAL LOW (ref 8.9–10.3)
Chloride: 108 mmol/L (ref 98–111)
Creatinine, Ser: 1.76 mg/dL — ABNORMAL HIGH (ref 0.61–1.24)
GFR, Estimated: 41 mL/min — ABNORMAL LOW (ref >60)
Glucose, Bld: 88 mg/dL (ref 70–99)
Potassium: 3.6 mmol/L (ref 3.5–5.1)
Sodium: 135 mmol/L (ref 135–145)

## 2020-08-25 LAB — GLUCOSE, CAPILLARY
Glucose-Capillary: 83 mg/dL (ref 70–99)
Glucose-Capillary: 81 mg/dL (ref 70–99)
Glucose-Capillary: 168 mg/dL — ABNORMAL HIGH (ref 70–99)

## 2020-08-25 LAB — HEMOGLOBIN A1C
Hgb A1c MFr Bld: 7 % — ABNORMAL HIGH (ref 4.8–5.6)
Mean Plasma Glucose: 154 mg/dL

## 2020-08-25 LAB — COOXEMETRY PANEL
Carboxyhemoglobin: 1 % (ref 0.5–1.5)
Methemoglobin: 1 % (ref 0.0–1.5)
O2 Saturation: 69.8 %
Total hemoglobin: 11.3 g/dL — ABNORMAL LOW (ref 12.0–16.0)

## 2020-08-25 SURGERY — ECHOCARDIOGRAM, TRANSESOPHAGEAL
Anesthesia: Monitor Anesthesia Care

## 2020-08-25 MED ORDER — POTASSIUM CHLORIDE CRYS ER 20 MEQ PO TBCR
40.0000 meq | EXTENDED_RELEASE_TABLET | Freq: Once | ORAL | Status: AC
  Administered 2020-08-25: 40 meq via ORAL
  Filled 2020-08-25: qty 2

## 2020-08-25 MED ORDER — SODIUM CHLORIDE 0.9 % IV SOLN: INTRAVENOUS | Status: DC

## 2020-08-25 MED ORDER — MIDODRINE HCL 5 MG PO TABS
2.5000 mg | ORAL_TABLET | Freq: Three times a day (TID) | ORAL | Status: DC
  Administered 2020-08-25 – 2020-09-01 (×21): 2.5 mg via ORAL
  Filled 2020-08-25 (×21): qty 1

## 2020-08-25 MED ORDER — DAPAGLIFLOZIN PROPANEDIOL 10 MG PO TABS
10.0000 mg | ORAL_TABLET | Freq: Every day | ORAL | Status: DC
  Administered 2020-08-25 – 2020-09-01 (×8): 10 mg via ORAL
  Filled 2020-08-25 (×8): qty 1

## 2020-08-25 MED ORDER — TORSEMIDE 20 MG PO TABS
20.0000 mg | ORAL_TABLET | Freq: Every day | ORAL | Status: DC
  Administered 2020-08-25: 20 mg via ORAL
  Filled 2020-08-25: qty 1

## 2020-08-25 MED ORDER — PROPOFOL 500 MG/50ML IV EMUL
INTRAVENOUS | Status: DC | PRN
  Administered 2020-08-25: 125 ug/kg/min via INTRAVENOUS

## 2020-08-25 MED ORDER — SODIUM CHLORIDE 0.9 % IV SOLN: INTRAVENOUS | Status: DC | PRN

## 2020-08-25 NOTE — CV Procedure (Signed)
Procedure: TEE  Indication: Atrial flutter  Sedation: Per anesthesiology  Findings: Please see echo section for full report.   Mildly dilated left ventricle with normal wall thickness.  EF 20%, diffuse hypokinesis.  No LV thrombus visualized.  Normal RV size with mildly decreased systolic function.  Trivial TR, unable to estimate PA systolic pressure.  Mild-moderate MR.  Trileaflet aortic valve with trivial regurgitation, no stenosis.  Moderate left atrial enlargement, no LA appendage thrombus. Mild right atrial enlargement.  No ASD/PFO by color doppler.  Normal caliber thoracic aorta.   May proceed to DCCV.   Douglas Villa 08/25/2020 11:40 AM

## 2020-08-25 NOTE — Progress Notes (Addendum)
Subjective:   Doing well. No complaints. No N/V, CP or SOB. UOP of 1.3 L over last 24 hrs. Plan for TEE/cardioversion today.  Objective Vital signs in last 24 hours: Vitals:   08/25/20 0200 08/25/20 0300 08/25/20 0400 08/25/20 0418  BP: 115/70 (!) 135/99 138/89   Pulse: 66 98 61   Resp: 19 18 18    Temp: (!) 97.3 F (36.3 C) (!) 97.3 F (36.3 C) (!) 97.5 F (36.4 C)   TempSrc:      SpO2: 100% 99% 100%   Weight:    75.4 kg  Height:       Weight change: 0.2 kg  Intake/Output Summary (Last 24 hours) at 08/25/2020 0757 Last data filed at 08/25/2020 0500 Gross per 24 hour  Intake 380.01 ml  Output 1275 ml  Net -894.99 ml    Assessment/ Plan: Pt is a 70 y.o. yo male with CAD, cardiomyopathy, HTN, paroxysmal A. fib, COPD, CKD 3b and T2DM who was admitted on 08/20/2020 with acute on chronic systolic heart failure. Nephrology consulted for acute on chronic renal failure.  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 Baseline creatinine around 2.0. Found to have elevated BUN/sCr of 81/3.93 on admission. Kidney function worsened initially but now improving, sCr 2.58->2.28-->1.76. Great UOP with good volume status. S/p placement of non-tunneled HD catheter 7/22 but not needed.  -- Kidney function back to baseline, can restart spirolactone -- Continue Milrinone to help with forward flow             -- Continue to hold torsemide; ok to restart [place]             -- Trend BMP, electrolyte             -- Strict I&O, daily weight 2. Hypertension/volume - Patient found to have a borderline low BP on admission.  Started on midodrine with improvement. SBP around 110-130s.              -- Continue holding antihypertensives as above             -- Continue midodrine 5 mg 3 times daily 4. Anemia (IDA) -iron studies 2 months ago showed iron 22, iron sat 8%, ferritin 42.  Hemoglobin normal at 14.7 on admission. Hgb currently at 10.9.  5.  Acute on chronic systolic HF: Thought  to be due to low output HF. Management per cardiology-  planning for a cardioversion today.  6.  CAD s/p DES to Lcx in 3/19 -continue home atorvastatin and Plavix.  7.  A. Fib on Eliquis: On amiodarone for rate control. Remains in A. Fib with HR in the 70s to 80s. Back on Eliquis. Management per cardiology- TEE/cardioversion today 8. Mild Hypokalemia-  Resolved  [name]   I have seen and examined this patient and agree with the plan of care. Patient renal function continues to improve and actually below his "BL Cr"  OK to restart [place] and if renal function continues to remain stable then can restart torsemide as well.  Will sign off at this time; please reconsult as needed.  Dwana Melena, MD 08/25/2020, 10:24 AM    Labs: Basic Metabolic Panel: Recent Labs  Lab 08/21/20 1344 08/22/20 0259 08/23/20 0446 08/24/20 0419 08/25/20 0416  NA  --    < > 135 136 135  K  --    < > 3.6 3.9 3.6  CL  --    < >  108 108 108  CO2  --    < > 21* 20* 20*  GLUCOSE  --    < > 109* 121* 88  BUN  --    < > 57* 40* 26*  CREATININE  --    < > 2.58* 2.28* 1.76*  CALCIUM  --    < > 8.2* 8.2* 7.8*  PHOS 3.4  --   --   --   --    < > = values in this interval not displayed.   Liver Function Tests: No results for input(s): AST, ALT, ALKPHOS, BILITOT, PROT, ALBUMIN in the last 168 hours. No results for input(s): LIPASE, AMYLASE in the last 168 hours. No results for input(s): AMMONIA in the last 168 hours. CBC: Recent Labs  Lab 08/20/20 1654 08/22/20 0259 08/22/20 1215 08/23/20 0446 08/24/20 0419 08/25/20 0416  WBC 5.3 5.1  --  4.2 4.5 4.3  HGB 14.7 12.5*   < > 11.8* 10.9* 10.9*  HCT 45.7 36.4*   < > 36.0* 33.3* 34.0*  MCV 88.1 82.9  --  86.3 87.2 88.1  PLT 245 192  --  149* 140* 148*   < > = values in this interval not displayed.   Cardiac Enzymes: No results for input(s): CKTOTAL, CKMB, CKMBINDEX, TROPONINI in the last 168 hours. CBG: Recent Labs  Lab 08/24/20 0558  08/24/20 1147 08/24/20 1531 08/24/20 2132 08/25/20 0630  GLUCAP 104* 122* 156* 159* 83    Iron Studies: No results for input(s): IRON, TIBC, TRANSFERRIN, FERRITIN in the last 72 hours. Studies/Results: No results found. Medications: Infusions:  sodium chloride 10 mL/hr at 08/25/20 0400   sodium chloride     sodium chloride     milrinone 0.25 mcg/kg/min (08/25/20 0630)    Scheduled Medications:  amiodarone  200 mg Oral BID   apixaban  5 mg Oral BID   atorvastatin  80 mg Oral Daily   Chlorhexidine Gluconate Cloth  6 each Topical Daily   clopidogrel  75 mg Oral Daily   dapagliflozin propanediol  10 mg Oral Daily   insulin aspart  0-9 Units Subcutaneous TID WC   midodrine  2.5 mg Oral TID WC   pantoprazole  40 mg Oral Q1200   potassium chloride  40 mEq Oral Once   sodium chloride flush  3 mL Intravenous Q12H   sodium chloride flush  3 mL Intravenous Q12H   torsemide  20 mg Oral Daily    have reviewed scheduled and prn medications.  Physical Exam: General: Pleasant, well-appearing man laying in bed. No acute distress. Chest: non-TDC on R chest.  CV: Regular rate. Irregular rhythm. No murmurs, rubs, or gallops. No LE edema Pulmonary: Lungs CTAB. Normal effort. No wheezing or rales. Abdominal: Soft, nontender, nondistended. Normal bowel sounds. Extremities: Palpable radial and DP pulses. Normal ROM.  Skin: Warm and dry. No obvious rash or lesions. Neuro: A&Ox3. Moves all extremities. Normal sensation. No focal deficit.    08/25/2020,7:57 AM  LOS: 5 days

## 2020-08-25 NOTE — Progress Notes (Signed)
Patient ID: Douglas Villa, male   DOB: May 01, 1950, 70 y.o.   MRN: [id number]     Advanced Heart Failure Rounding Note  PCP-Cardiologist: Dr. Aundra Dubin   Subjective:    07/20: Admit with malaise, N/V, fatigue and low output HF w/ AKI on CKD. Started milrinone 0.25.  07/21: Nephrology consulted. Considering HD. Diuretics held.  Creatinine 4.34 > 3.70 > 2.58 > 2.28 > 1.76.  He remains on milrinone 0.25.   Feeling better, more energy.  Walked with PT yesterday.   Swan numbers: RA 9 PA 41/27 CI 2.3 Co-ox 70%   Echo 08/21/2020: EF 25%, apical akinesis, GIIDD, RV okay,   RVSP 26 mmHg, mild MR, mild AI  RHC Procedural Findings on milrinone 0.25: Hemodynamics (mmHg) RA mean 1 RV 25/1 PA 28/9, mean 16 PCWP mean 3 Oxygen saturations: PA 65% AO 99% Cardiac Output (Fick) 4.14  Cardiac Index (Fick) 2.3 Cardiac Output (Thermo) 3.72  Cardiac Index (Thermo) 2.07  Objective:   Weight Range: 75.4 kg Body mass index is 26.03 kg/m.   Vital Signs:   Temp:  [97.3 F (36.3 C)-98.24 F (36.8 C)] 97.5 F (36.4 C) (07/25 0400) Pulse Rate:  [54-98] 61 (07/25 0400) Resp:  [17-28] 18 (07/25 0400) BP: (110-150)/(68-111) 138/89 (07/25 0400) SpO2:  [87 %-100 %] 100 % (07/25 0400) Weight:  [75.4 kg] 75.4 kg (07/25 0418) Last BM Date: 08/16/20  Weight change: Filed Weights   08/23/20 0400 08/24/20 0500 08/25/20 0418  Weight: 71.8 kg 75.2 kg 75.4 kg    Intake/Output:   Intake/Output Summary (Last 24 hours) at 08/25/2020 0732 Last data filed at 08/25/2020 0500 Gross per 24 hour  Intake 380.01 ml  Output 1275 ml  Net -894.99 ml      Physical Exam    General: NAD Neck: No JVD, no thyromegaly or thyroid nodule.  Lungs: Clear to auscultation bilaterally with normal respiratory effort. CV: Nondisplaced PMI.  Heart irregular S1/S2, no S3/S4, no murmur.  No peripheral edema.   Abdomen: Soft, nontender, no hepatosplenomegaly, no distention.  Skin: Intact without lesions or rashes.   Neurologic: Alert and oriented x 3.  Psych: Normal affect. Extremities: No clubbing or cyanosis.  HEENT: Normal.    Telemetry   Atrial flutter, 70s (personally reviewed)  Labs    CBC Recent Labs    08/24/20 0419 08/25/20 0416  WBC 4.5 4.3  HGB 10.9* 10.9*  HCT 33.3* 34.0*  MCV 87.2 88.1  PLT 140* 623*   Basic Metabolic Panel Recent Labs    08/24/20 0419 08/25/20 0416  NA 136 135  K 3.9 3.6  CL 108 108  CO2 20* 20*  GLUCOSE 121* 88  BUN 40* 26*  CREATININE 2.28* 1.76*  CALCIUM 8.2* 7.8*   Liver Function Tests No results for input(s): AST, ALT, ALKPHOS, BILITOT, PROT, ALBUMIN in the last 72 hours. No results for input(s): LIPASE, AMYLASE in the last 72 hours. Cardiac Enzymes No results for input(s): CKTOTAL, CKMB, CKMBINDEX, TROPONINI in the last 72 hours.  BNP: BNP (last 3 results) Recent Labs    07/22/20 0109 07/23/20 0436 08/20/20 1002  BNP 2,071.8* 1,879.9* 338.1*    ProBNP (last 3 results) No results for input(s): PROBNP in the last 8760 hours.   D-Dimer No results for input(s): DDIMER in the last 72 hours. Hemoglobin A1C No results for input(s): HGBA1C in the last 72 hours. Fasting Lipid Panel No results for input(s): CHOL, HDL, LDLCALC, TRIG, CHOLHDL, LDLDIRECT in the last 72 hours. Thyroid Function Tests  No results for input(s): TSH, T4TOTAL, T3FREE, THYROIDAB in the last 72 hours.  Invalid input(s): FREET3  Other results:   Imaging    No results found.   Medications:     Scheduled Medications:  amiodarone  200 mg Oral BID   apixaban  5 mg Oral BID   atorvastatin  80 mg Oral Daily   Chlorhexidine Gluconate Cloth  6 each Topical Daily   clopidogrel  75 mg Oral Daily   dapagliflozin propanediol  10 mg Oral Daily   insulin aspart  0-9 Units Subcutaneous TID WC   midodrine  2.5 mg Oral TID WC   pantoprazole  40 mg Oral Q1200   potassium chloride  40 mEq Oral Once   sodium chloride flush  3 mL Intravenous Q12H   sodium  chloride flush  3 mL Intravenous Q12H   torsemide  20 mg Oral Daily    Infusions:  sodium chloride 10 mL/hr at 08/25/20 0400   sodium chloride     sodium chloride     milrinone 0.25 mcg/kg/min (08/25/20 0630)    PRN Medications: sodium chloride, acetaminophen, lidocaine (PF), sodium chloride flush    Assessment/Plan   1. CAD: H/o DES to LCX in 3/19, had occluded PLV at that time. Admitted with anterior STEMI 5/22 and occluded mLAD, had DES to mLAD, known CTO PLV with collaterals and patent LCx stent.  No chest pain now.  - Continue Plavix, no ASA with apixaban use.  - Continue atorvastatin 80. 2. AKI on CKD IIIb: Creatinine 3.93>4.34>3.70>2.58>2.28>1.76.  Suspect combination of low output and low volume status, improving with milrinone and gentle rehydration. Now CVP up to 9-10.  - Holding torsemide, spironolactone, hydralazine/Imdur. - OK ot restart [place] today.  - Continue milrinone for now.  - Decrease midodrine to 2.5 mg TID - Does not need HD.  3. 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  TEE 05/22 showing EF 20-25%, moderately decreased RV function with mild RVE, moderate biatrial enlargement, PASP 34, mild MR, mild TR.  Cardiogenic shock during 5/22 admission, was eventually able to titrate off IABP and milrinone.  He has struggled since discharge, admitted this time with with AKI and prominent fatigue, cool extremities, low output HF. Echo this admit with EF 25%, RV okay, mild MR, mild AI.  Milrinone 0.25 started, Swan placed.  This morning, CI 2.3 with co-ox 70% on milrinone 0.25.  CVP climbing to 9-10, creatinine continues to improve.   - Start back on torsemide 20 mg daily.  - Restart Farxiga 10 mg daily.  - Stay off spironolactone, hydralazine/imdur for now.  - Decrease midodrine to 2.5 mg TID - Off digoxin with recent AKI and high level/toxicity. - Continue  milrinone 0.25 mcg/kg/min.  Leanna Sato, leave introducer to follow CVP/co-ox.  - Concerning clinical picture with suspected recurrent low output HF.  With improvement in creatinine on milrinone, can consider LVAD.  Will have LVAD coordinator see.  Palliative care is also following.  4. COPD: Prior smoker 5. Atrial flutter: Atypical, had DCCV in 5/22 but now back in atrial flutter.  Rate controlled.   - Continue amiodarone 200 mg bid.  - Resumed apixaban post RHC - TEE-guided DCCV today  CRITICAL CARE Performed by: Loralie Champagne  Total critical care time: 35 minutes  Critical care time was exclusive of separately billable procedures and treating other patients.  Critical care was necessary to  treat or prevent imminent or life-threatening deterioration.  Critical care was time spent personally by me on the following activities: development of treatment plan with patient and/or surrogate as well as nursing, discussions with consultants, evaluation of patient's response to treatment, examination of patient, obtaining history from patient or surrogate, ordering and performing treatments and interventions, ordering and review of laboratory studies, ordering and review of radiographic studies, pulse oximetry and re-evaluation of patient's condition.   Loralie Champagne 08/25/2020 7:32 AM

## 2020-08-25 NOTE — Transfer of Care (Signed)
Immediate Anesthesia Transfer of Care Note  Patient: Douglas Villa  Procedure(s) Performed: TRANSESOPHAGEAL ECHOCARDIOGRAM (TEE) CARDIOVERSION  Patient Location: Endoscopy Unit  Anesthesia Type:MAC  Level of Consciousness: drowsy and patient cooperative  Airway & Oxygen Therapy: Patient Spontanous Breathing and Patient connected to nasal cannula oxygen  Post-op Assessment: Report given to RN and Post -op Vital signs reviewed and stable  Post vital signs: Reviewed and stable  Last Vitals:  Vitals Value Taken Time  BP 102/70 08/25/20 1141  Temp    Pulse 79 08/25/20 1141  Resp 23 08/25/20 1141  SpO2 100 % 08/25/20 1141  Vitals shown include unvalidated device data.  Last Pain:  Vitals:   08/25/20 1047  TempSrc: Tympanic  PainSc: 0-No pain         Complications: No notable events documented.

## 2020-08-25 NOTE — Plan of Care (Signed)

## 2020-08-25 NOTE — Anesthesia Preprocedure Evaluation (Signed)
Anesthesia Evaluation  Patient identified by MRN, date of birth, ID band Patient awake    Reviewed: Allergy & Precautions, H&P , NPO status , Patient's Chart, lab work & pertinent test results  Airway Mallampati: II   Neck ROM: full    Dental   Pulmonary sleep apnea , COPD, former smoker,    breath sounds clear to auscultation       Cardiovascular hypertension, + angina + CAD   Rhythm:irregular Rate:Normal     Neuro/Psych    GI/Hepatic   Endo/Other  diabetes, Type 2  Renal/GU Renal InsufficiencyRenal disease     Musculoskeletal   Abdominal   Peds  Hematology  (+) Blood dyscrasia, anemia ,   Anesthesia Other Findings   Reproductive/Obstetrics                             Anesthesia Physical Anesthesia Plan  ASA: 3  Anesthesia Plan: MAC   Post-op Pain Management:    Induction: Intravenous  PONV Risk Score and Plan: 1 and Propofol infusion and Treatment may vary due to age or medical condition  Airway Management Planned: Nasal Cannula  Additional Equipment:   Intra-op Plan:   Post-operative Plan:   Informed Consent: I have reviewed the patients History and Physical, chart, labs and discussed the procedure including the risks, benefits and alternatives for the proposed anesthesia with the patient or authorized representative who has indicated his/her understanding and acceptance.     Dental advisory given  Plan Discussed with: CRNA, Anesthesiologist and Surgeon  Anesthesia Plan Comments:         Anesthesia Quick Evaluation

## 2020-08-25 NOTE — Procedures (Addendum)
Electrical Cardioversion Procedure Note Douglas Villa [id number] [birth year]  Procedure: Electrical Cardioversion Indications:  Atrial Fibrillation  Procedure Details Consent: Risks of procedure as well as the alternatives and risks of each were explained to the (patient/caregiver).  Consent for procedure obtained. Time Out: Verified patient identification, verified procedure, site/side was marked, verified correct patient position, special equipment/implants available, medications/allergies/relevent history reviewed, required imaging and test results available.  Performed  Patient placed on cardiac monitor, pulse oximetry, supplemental oxygen as necessary.  Sedation given:  Propofol per anesthesiology Pacer pads placed anterior and posterior chest.  Cardioverted 1 time(s).  Cardioverted at [place].  Evaluation Findings: Post procedure EKG shows: NSR Complications: None Patient did tolerate procedure well.   Loralie Champagne 08/25/2020, 11:41 AM

## 2020-08-25 NOTE — Progress Notes (Signed)
Physical Therapy Treatment Patient Details Name: Douglas Villa MRN: [id number] DOB: 12-26-1950 Today's Date: 08/25/2020    History of Present Illness Pt is a 70 y.o. yo male  who was admitted on 08/20/2020 with acute on chronic systolic heart failure. Echo with EF 25%; underwent Rt heart cath; AKI with temporary dialysis catheter placed but HD not yet needed;   PMH CAD, cardiomyopathy, HTN, paroxysmal A. fib, COPD, CKD 3b and T2DM    PT Comments    Pt tolerated treatment well during session. VSS throughout (See below). Pt demonstrated increased ambulation distance compared to previous session and ambulated without RW for approximately 158ft. Progress to ambulating total distance without RW next visit. Pre-vitals - HR 85, spO2 100%, BP 149/87(101) Hr during ambulation - 90-175max Post-vitals - HR 108, spO2 100%  Follow Up Recommendations  Home health PT     Equipment Recommendations  None recommended by PT    Recommendations for Other Services       Precautions / Restrictions Precautions Precautions: Fall Restrictions Weight Bearing Restrictions: No    Mobility  Bed Mobility Overal bed mobility: Modified Independent Bed Mobility: Supine to Sit     Supine to sit: HOB elevated;Modified independent (Device/Increase time)     General bed mobility comments: assist with lines only    Transfers Overall transfer level: Modified independent Equipment used: None                Ambulation/Gait Ambulation/Gait assistance: Min guard Gait Distance (Feet): 400 Feet Assistive device: Rolling walker (2 wheeled) Gait Pattern/deviations: Step-through pattern   Gait velocity interpretation: >2.62 ft/sec, indicative of community ambulatory General Gait Details: Ambulated with RW 2/3 of the time, without for last 1/3. Pt remained steady on feet without RW and demonstrated step-through gait pattern.   Stairs             Wheelchair Mobility    Modified Rankin  (Stroke Patients Only)       Balance Overall balance assessment: No apparent balance deficits (not formally assessed)                                          Cognition Arousal/Alertness: Awake/alert Behavior During Therapy: WFL for tasks assessed/performed Overall Cognitive Status: Within Functional Limits for tasks assessed                                 General Comments: Responded accurately to commands      Exercises General Exercises - Lower Extremity Long Arc Quad: AROM;Both;Seated;20 reps Hip Flexion/Marching: AROM;Both;20 reps;Seated Toe Raises: AROM;Both;Seated;20 reps Heel Raises: AROM;Both;20 reps;Seated    General Comments        Pertinent Vitals/Pain VSS. Pain Assessment: No/denies pain    Home Living Family/patient expects to be discharged to:: Private residence Living Arrangements: Alone                  Prior Function            PT Goals (current goals can now be found in the care plan section) Acute Rehab PT Goals Patient Stated Goal: return home PT Goal Formulation: With patient Time For Goal Achievement: 09/06/20 Progress towards PT goals: Progressing toward goals    Frequency    Min 3X/week      PT Plan Current plan remains appropriate  Co-evaluation              AM-PAC PT "6 Clicks" Mobility   Outcome Measure  Help needed turning from your back to your side while in a flat bed without using bedrails?: None Help needed moving from lying on your back to sitting on the side of a flat bed without using bedrails?: None Help needed moving to and from a bed to a chair (including a wheelchair)?: None Help needed standing up from a chair using your arms (e.g., wheelchair or bedside chair)?: None Help needed to walk in hospital room?: A Little Help needed climbing 3-5 steps with a railing? : A Little 6 Click Score: 22    End of Session Equipment Utilized During Treatment: Gait  belt Activity Tolerance: Patient tolerated treatment well Patient left: in chair;with call bell/phone within reach;with chair alarm set;with nursing/sitter in room Nurse Communication: Mobility status PT Visit Diagnosis: Difficulty in walking, not elsewhere classified (R26.2)     Time: 7846-9629 PT Time Calculation (min) (ACUTE ONLY): 19 min  Charges:  $Gait Training: 8-22 mins                     Louie Casa, SPT Acute Rehab: [phone]    Domingo Dimes 08/25/2020, 10:59 AM

## 2020-08-25 NOTE — Anesthesia Procedure Notes (Signed)
Procedure Name: MAC Date/Time: 08/25/2020 11:22 AM Performed by: Kathryne Hitch, CRNA Pre-anesthesia Checklist: Patient identified, Emergency Drugs available, Suction available and Patient being monitored Patient Re-evaluated:Patient Re-evaluated prior to induction Oxygen Delivery Method: Nasal cannula Preoxygenation: Pre-oxygenation with 100% oxygen Induction Type: IV induction Placement Confirmation: positive ETCO2 Dental Injury: Teeth and Oropharynx as per pre-operative assessment

## 2020-08-25 NOTE — Interval H&P Note (Signed)
History and Physical Interval Note:  08/25/2020 11:24 AM  Douglas Villa  has presented today for surgery, with the diagnosis of AFIB.  The various methods of treatment have been discussed with the patient and family. After consideration of risks, benefits and other options for treatment, the patient has consented to  Procedure(s): TRANSESOPHAGEAL ECHOCARDIOGRAM (TEE) (N/A) CARDIOVERSION (N/A) as a surgical intervention.  The patient's history has been reviewed, patient examined, no change in status, stable for surgery.  I have reviewed the patient's chart and labs.  Questions were answered to the patient's satisfaction.     [name] [employer]

## 2020-08-25 NOTE — Anesthesia Postprocedure Evaluation (Signed)
Anesthesia Post Note  Patient: Douglas Villa  Procedure(s) Performed: TRANSESOPHAGEAL ECHOCARDIOGRAM (TEE) CARDIOVERSION     Patient location during evaluation: Endoscopy Anesthesia Type: MAC Level of consciousness: awake and alert Pain management: pain level controlled Vital Signs Assessment: post-procedure vital signs reviewed and stable Respiratory status: spontaneous breathing, nonlabored ventilation, respiratory function stable and patient connected to nasal cannula oxygen Cardiovascular status: stable and blood pressure returned to baseline Postop Assessment: no apparent nausea or vomiting Anesthetic complications: no   No notable events documented.  Last Vitals:  Vitals:   08/25/20 1150 08/25/20 1200  BP: 101/65   Pulse: 80 76  Resp: (!) 28 19  Temp:    SpO2: 99% 93%    Last Pain:  Vitals:   08/25/20 1200  TempSrc:   PainSc: 0-No pain                 Starlena Beil S

## 2020-08-25 NOTE — H&P (View-Only) (Signed)
Patient ID: Douglas Villa, male   DOB: [birth year], 70 y.o.   MRN: [id number]     Advanced Heart Failure Rounding Note  PCP-Cardiologist: Dr. Aundra Dubin   Subjective:    07/20: Admit with malaise, N/V, fatigue and low output HF w/ AKI on CKD. Started milrinone 0.25.  07/21: Nephrology consulted. Considering HD. Diuretics held.  Creatinine 4.34 > 3.70 > 2.58 > 2.28 > 1.76.  He remains on milrinone 0.25.   Feeling better, more energy.  Walked with PT yesterday.   Swan numbers: RA 9 PA 41/27 CI 2.3 Co-ox 70%   Echo 08/21/2020: EF 25%, apical akinesis, GIIDD, RV okay,   RVSP 26 mmHg, mild MR, mild AI  RHC Procedural Findings on milrinone 0.25: Hemodynamics (mmHg) RA mean 1 RV 25/1 PA 28/9, mean 16 PCWP mean 3 Oxygen saturations: PA 65% AO 99% Cardiac Output (Fick) 4.14  Cardiac Index (Fick) 2.3 Cardiac Output (Thermo) 3.72  Cardiac Index (Thermo) 2.07  Objective:   Weight Range: 75.4 kg Body mass index is 26.03 kg/m.   Vital Signs:   Temp:  [97.3 F (36.3 C)-98.24 F (36.8 C)] 97.5 F (36.4 C) (07/25 0400) Pulse Rate:  [54-98] 61 (07/25 0400) Resp:  [17-28] 18 (07/25 0400) BP: (110-150)/(68-111) 138/89 (07/25 0400) SpO2:  [87 %-100 %] 100 % (07/25 0400) Weight:  [75.4 kg] 75.4 kg (07/25 0418) Last BM Date: 08/16/20  Weight change: Filed Weights   08/23/20 0400 08/24/20 0500 08/25/20 0418  Weight: 71.8 kg 75.2 kg 75.4 kg    Intake/Output:   Intake/Output Summary (Last 24 hours) at 08/25/2020 0732 Last data filed at 08/25/2020 0500 Gross per 24 hour  Intake 380.01 ml  Output 1275 ml  Net -894.99 ml      Physical Exam    General: NAD Neck: No JVD, no thyromegaly or thyroid nodule.  Lungs: Clear to auscultation bilaterally with normal respiratory effort. CV: Nondisplaced PMI.  Heart irregular S1/S2, no S3/S4, no murmur.  No peripheral edema.   Abdomen: Soft, nontender, no hepatosplenomegaly, no distention.  Skin: Intact without lesions or rashes.   Neurologic: Alert and oriented x 3.  Psych: Normal affect. Extremities: No clubbing or cyanosis.  HEENT: Normal.    Telemetry   Atrial flutter, 70s (personally reviewed)  Labs    CBC Recent Labs    08/24/20 0419 08/25/20 0416  WBC 4.5 4.3  HGB 10.9* 10.9*  HCT 33.3* 34.0*  MCV 87.2 88.1  PLT 140* 638*   Basic Metabolic Panel Recent Labs    08/24/20 0419 08/25/20 0416  NA 136 135  K 3.9 3.6  CL 108 108  CO2 20* 20*  GLUCOSE 121* 88  BUN 40* 26*  CREATININE 2.28* 1.76*  CALCIUM 8.2* 7.8*   Liver Function Tests No results for input(s): AST, ALT, ALKPHOS, BILITOT, PROT, ALBUMIN in the last 72 hours. No results for input(s): LIPASE, AMYLASE in the last 72 hours. Cardiac Enzymes No results for input(s): CKTOTAL, CKMB, CKMBINDEX, TROPONINI in the last 72 hours.  BNP: BNP (last 3 results) Recent Labs    07/22/20 0109 07/23/20 0436 08/20/20 1002  BNP 2,071.8* 1,879.9* 338.1*    ProBNP (last 3 results) No results for input(s): PROBNP in the last 8760 hours.   D-Dimer No results for input(s): DDIMER in the last 72 hours. Hemoglobin A1C No results for input(s): HGBA1C in the last 72 hours. Fasting Lipid Panel No results for input(s): CHOL, HDL, LDLCALC, TRIG, CHOLHDL, LDLDIRECT in the last 72 hours. Thyroid Function Tests  No results for input(s): TSH, T4TOTAL, T3FREE, THYROIDAB in the last 72 hours.  Invalid input(s): FREET3  Other results:   Imaging    No results found.   Medications:     Scheduled Medications:  amiodarone  200 mg Oral BID   apixaban  5 mg Oral BID   atorvastatin  80 mg Oral Daily   Chlorhexidine Gluconate Cloth  6 each Topical Daily   clopidogrel  75 mg Oral Daily   dapagliflozin propanediol  10 mg Oral Daily   insulin aspart  0-9 Units Subcutaneous TID WC   midodrine  2.5 mg Oral TID WC   pantoprazole  40 mg Oral Q1200   potassium chloride  40 mEq Oral Once   sodium chloride flush  3 mL Intravenous Q12H   sodium  chloride flush  3 mL Intravenous Q12H   torsemide  20 mg Oral Daily    Infusions:  sodium chloride 10 mL/hr at 08/25/20 0400   sodium chloride     sodium chloride     milrinone 0.25 mcg/kg/min (08/25/20 0630)    PRN Medications: sodium chloride, acetaminophen, lidocaine (PF), sodium chloride flush    Assessment/Plan   1. CAD: H/o DES to LCX in 3/19, had occluded PLV at that time. Admitted with anterior STEMI 5/22 and occluded mLAD, had DES to mLAD, known CTO PLV with collaterals and patent LCx stent.  No chest pain now.  - Continue Plavix, no ASA with apixaban use.  - Continue atorvastatin 80. 2. AKI on CKD IIIb: Creatinine 3.93>4.34>3.70>2.58>2.28>1.76.  Suspect combination of low output and low volume status, improving with milrinone and gentle rehydration. Now CVP up to 9-10.  - Holding torsemide, spironolactone, hydralazine/Imdur. - OK ot restart Wilder Glade today.  - Continue milrinone for now.  - Decrease midodrine to 2.5 mg TID - Does not need HD.  3. 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  TEE 05/22 showing EF 20-25%, moderately decreased RV function with mild RVE, moderate biatrial enlargement, PASP 34, mild MR, mild TR.  Cardiogenic shock during 5/22 admission, was eventually able to titrate off IABP and milrinone.  He has struggled since discharge, admitted this time with with AKI and prominent fatigue, cool extremities, low output HF. Echo this admit with EF 25%, RV okay, mild MR, mild AI.  Milrinone 0.25 started, Swan placed.  This morning, CI 2.3 with co-ox 70% on milrinone 0.25.  CVP climbing to 9-10, creatinine continues to improve.   - Start back on torsemide 20 mg daily.  - Restart Farxiga 10 mg daily.  - Stay off spironolactone, hydralazine/imdur for now.  - Decrease midodrine to 2.5 mg TID - Off digoxin with recent AKI and high level/toxicity. - Continue  milrinone 0.25 mcg/kg/min.  Leanna Sato, leave introducer to follow CVP/co-ox.  - Concerning clinical picture with suspected recurrent low output HF.  With improvement in creatinine on milrinone, can consider LVAD.  Will have LVAD coordinator see.  Palliative care is also following.  4. COPD: Prior smoker 5. Atrial flutter: Atypical, had DCCV in 5/22 but now back in atrial flutter.  Rate controlled.   - Continue amiodarone 200 mg bid.  - Resumed apixaban post RHC - TEE-guided DCCV today  CRITICAL CARE Performed by: Loralie Champagne  Total critical care time: 35 minutes  Critical care time was exclusive of separately billable procedures and treating other patients.  Critical care was necessary to  treat or prevent imminent or life-threatening deterioration.  Critical care was time spent personally by me on the following activities: development of treatment plan with patient and/or surrogate as well as nursing, discussions with consultants, evaluation of patient's response to treatment, examination of patient, obtaining history from patient or surrogate, ordering and performing treatments and interventions, ordering and review of laboratory studies, ordering and review of radiographic studies, pulse oximetry and re-evaluation of patient's condition.   Loralie Champagne 08/25/2020 7:32 AM

## 2020-08-25 NOTE — Progress Notes (Signed)
[facility] EDUCATION NOTE:                I met with Mr Stuck today and briefly discussed VAD/VAD evaluation.   VAD educational packet including "Understanding Your Options with Advanced Heart Failure", "[place] Patient Agreement for VAD Evaluation and Potential Implantation" consent, and [employer] "Living a More Active Life" HM III booklet", "[place] HM III Patient Education", "[place] Mechanical Circulatory Support Program", and "Decision Aids for Left Ventricular Assist Device" reviewed in detail and left at bedside for continued reference.    All questions answered regarding VAD implant, hospital stay, and what to expect when discharged home living with a heart pump. Explained need for 24/7 care when pt is discharged home due to sternal precautions, adaptation to living on support, emotional support, consistent and meticulous exit site care and management, medication adherence and high volume of follow up visits with the [facility] after discharge; pt verbalized understanding of above.   Pt plans to discuss the above with his ex-wife [facility]. He is unsure whether or not she would be willing, or able, to be a caregiver for him. She currently works doing "home care." He is worried about "being a burden" to her. Pt currently lives at the [facility] in [place]. He does not think [facility] would have space in her home for him to move in (even for a short period of time) with her. He plans to call and speak with her this afternoon. I offered for VAD coordinator to speak with her over the phone, or meet with her in person at bedside, to discuss VAD. Pt states he will discuss this with her. Pt also has a son Darnell Level), but pt states he does not think he can rely on Bruce to be a caregiver as he has his own health issues.   Explained that LVAD can be implanted for two indications in the setting of advanced left ventricular heart failure treatment:  Bridge to transplant - used for patients who cannot  safely wait for heart transplant without this device.  Or    Destination therapy - used for patients until end of life or recovery of heart function.  Patient acknowledges that the indication at this point in time for LVAD therapy would be for destination therapy due to age.   Provided brief equipment overview HeartMate III:   a) mobile power unit b) system controller   c) universal Charity fundraiser   d) battery clips   e) Batteries f) drive line    Briefly discussed VAD drive line, site care, dressing changes, and drive line stabilization including securement attachment device.   Intermacs patient survival statistics through March 2022 reviewed with patient and caregiver as follows:                                                The patient understands that from this discussion it does not mean that they will receive the device, but that depends on an extensive evaluation process. The patient is aware of the fact that if at anytime they want to stop the evaluation process they can.  Pt would like to speak with his ex-wife to see if she would be able to serve as a caregiver for him prior to any further discussion/work up for VAD. He does not wish to undergo evaluation  until he knows he would have a caregiver. All questions have been answered at this time and contact information was provided should he encounter any further questions.    Emerson Monte RN [facility] Coordinator  Office: [phone]  24/7 Pager: [phone]

## 2020-08-26 DIAGNOSIS — I509 Heart failure, unspecified: Secondary | ICD-10-CM

## 2020-08-26 LAB — GLUCOSE, CAPILLARY
Glucose-Capillary: 89 mg/dL (ref 70–99)
Glucose-Capillary: 125 mg/dL — ABNORMAL HIGH (ref 70–99)
Glucose-Capillary: 111 mg/dL — ABNORMAL HIGH (ref 70–99)
Glucose-Capillary: 196 mg/dL — ABNORMAL HIGH (ref 70–99)

## 2020-08-26 LAB — BASIC METABOLIC PANEL
Anion gap: 7 (ref 5–15)
BUN: 27 mg/dL — ABNORMAL HIGH (ref 8–23)
CO2: 23 mmol/L (ref 22–32)
Calcium: 8.3 mg/dL — ABNORMAL LOW (ref 8.9–10.3)
Chloride: 107 mmol/L (ref 98–111)
Creatinine, Ser: 2.14 mg/dL — ABNORMAL HIGH (ref 0.61–1.24)
GFR, Estimated: 32 mL/min — ABNORMAL LOW (ref >60)
Glucose, Bld: 89 mg/dL (ref 70–99)
Potassium: 3.3 mmol/L — ABNORMAL LOW (ref 3.5–5.1)
Sodium: 137 mmol/L (ref 135–145)

## 2020-08-26 LAB — CBC
HCT: 34.6 % — ABNORMAL LOW (ref 39.0–52.0)
Hemoglobin: 11.6 g/dL — ABNORMAL LOW (ref 13.0–17.0)
MCH: 28.8 pg (ref 26.0–34.0)
MCHC: 33.5 g/dL (ref 30.0–36.0)
MCV: 85.9 fL (ref 80.0–100.0)
Platelets: 160 10*3/uL (ref 150–400)
RBC: 4.03 MIL/uL — ABNORMAL LOW (ref 4.22–5.81)
RDW: 17.5 % — ABNORMAL HIGH (ref 11.5–15.5)
WBC: 4.1 10*3/uL (ref 4.0–10.5)
nRBC: 0 % (ref 0.0–0.2)

## 2020-08-26 LAB — COOXEMETRY PANEL
Carboxyhemoglobin: 0.9 % (ref 0.5–1.5)
Methemoglobin: 0.8 % (ref 0.0–1.5)
O2 Saturation: 64.9 %
Total hemoglobin: 11.7 g/dL — ABNORMAL LOW (ref 12.0–16.0)

## 2020-08-26 MED ORDER — POTASSIUM CHLORIDE CRYS ER 20 MEQ PO TBCR
40.0000 meq | EXTENDED_RELEASE_TABLET | Freq: Once | ORAL | Status: AC
  Administered 2020-08-26: 40 meq via ORAL
  Filled 2020-08-26: qty 2

## 2020-08-26 NOTE — Progress Notes (Signed)
Palliative: Chart review completed.  Douglas Villa is now connected with the heart failure team.  Nephrology following.  Agreeable to hemodialysis if needed/offered.  It is noted that with improvement in creatinine on milrinone he can now be considered for LVAD.  His goals are set for full scope/full code at this point.  He is agreeable to outpatient palliative services to continue goals of care/CODE STATUS discussions.  May benefit from PT eval for short-term rehab.  Conference with attending, bedside nursing staff, transition of care team related to patient condition, needs, goals of care, disposition.  Heart failure team/LVAD coordinator following now. PMT LVAD provider to continue to shadow for declines.  Plan: Continue to treat the treatable.  Follow-up with heart failure team.  Agreeable to outpatient palliative services for further goals of care/CODE STATUS discussions.  15 minutes  Quinn Axe, NP Palliative medicine team Team phone [phone] Greater than 50% of this time was spent counseling and coordinating care related to the above assessment and plan.

## 2020-08-26 NOTE — Progress Notes (Signed)
VAD Coordinator met briefly with patient to ask about starting VAD evaluation. Patient says he has not made up his mind about proceeding with evaluation as of yet. He has not spoken with his ex-wife about being his caregiver stating, he's not sure he wants to bring that up.  He does agree to speak with VAD patient about VAD surgery and living with LVAD. Reached out to current LVAD patient who plans on seeing patient later this afternoon.   Zada Girt RN, [place] Coordinator [phone]

## 2020-08-26 NOTE — Progress Notes (Signed)
Patient ID: Douglas Villa, male   DOB: [birth year], 70 y.o.   MRN: [id number]     Advanced Heart Failure Rounding Note  PCP-Cardiologist: Dr. Aundra Dubin   Subjective:    07/20: Admit with malaise, N/V, fatigue and low output HF w/ AKI on CKD. Started milrinone 0.25.  07/21: Nephrology consulted. Considering HD. Diuretics held.  Creatinine 4.34 > 3.70 > 2.58 > 2.28 > 1.76 > 2.14.  He remains on milrinone 0.25.   Some nausea this morning, now resolved.  Otherwise feels fine.   Vigorous diuresis yesterday with torsemide 20 mg x 1.  I/Os net negative 2673.  CVP 7 today, co-ox 65%.   TEE-guided DCCV to NSR on 7/25, NSR today.   Echo 08/21/2020: EF 25%, apical akinesis, GIIDD, RV okay,   RVSP 26 mmHg, mild MR, mild AI  RHC Procedural Findings on milrinone 0.25: Hemodynamics (mmHg) RA mean 1 RV 25/1 PA 28/9, mean 16 PCWP mean 3 Oxygen saturations: PA 65% AO 99% Cardiac Output (Fick) 4.14  Cardiac Index (Fick) 2.3 Cardiac Output (Thermo) 3.72  Cardiac Index (Thermo) 2.07  Objective:   Weight Range: 76 kg Body mass index is 26.24 kg/m.   Vital Signs:   Temp:  [97.3 F (36.3 C)-98.3 F (36.8 C)] 98 F (36.7 C) (07/25 2355) Pulse Rate:  [52-93] 71 (07/26 0500) Resp:  [13-28] 18 (07/26 0500) BP: (101-149)/(61-96) 135/88 (07/26 0300) SpO2:  [93 %-100 %] 98 % (07/26 0500) Weight:  [76 kg] 76 kg (07/26 0459) Last BM Date: 08/25/20  Weight change: Filed Weights   08/24/20 0500 08/25/20 0418 08/26/20 0459  Weight: 75.2 kg 75.4 kg 76 kg    Intake/Output:   Intake/Output Summary (Last 24 hours) at 08/26/2020 0753 Last data filed at 08/26/2020 0700 Gross per 24 hour  Intake 1254.23 ml  Output 3927 ml  Net -2672.77 ml      Physical Exam    General: NAD Neck: No JVD, no thyromegaly or thyroid nodule.  Lungs: Clear to auscultation bilaterally with normal respiratory effort. CV: Nondisplaced PMI.  Heart regular S1/S2, no S3/S4, no murmur.  No peripheral edema.    Abdomen: Soft, nontender, no hepatosplenomegaly, no distention.  Skin: Intact without lesions or rashes.  Neurologic: Alert and oriented x 3.  Psych: Normal affect. Extremities: No clubbing or cyanosis.  HEENT: Normal.    Telemetry   NSR 80s (personally reviewed)  Labs    CBC Recent Labs    08/25/20 0416 08/26/20 0511  WBC 4.3 4.1  HGB 10.9* 11.6*  HCT 34.0* 34.6*  MCV 88.1 85.9  PLT 148* 710   Basic Metabolic Panel Recent Labs    08/25/20 0416 08/26/20 0511  NA 135 137  K 3.6 3.3*  CL 108 107  CO2 20* 23  GLUCOSE 88 89  BUN 26* 27*  CREATININE 1.76* 2.14*  CALCIUM 7.8* 8.3*   Liver Function Tests No results for input(s): AST, ALT, ALKPHOS, BILITOT, PROT, ALBUMIN in the last 72 hours. No results for input(s): LIPASE, AMYLASE in the last 72 hours. Cardiac Enzymes No results for input(s): CKTOTAL, CKMB, CKMBINDEX, TROPONINI in the last 72 hours.  BNP: BNP (last 3 results) Recent Labs    07/22/20 0109 07/23/20 0436 08/20/20 1002  BNP 2,071.8* 1,879.9* 338.1*    ProBNP (last 3 results) No results for input(s): PROBNP in the last 8760 hours.   D-Dimer No results for input(s): DDIMER in the last 72 hours. Hemoglobin A1C Recent Labs    08/24/20 0419  HGBA1C  7.0*   Fasting Lipid Panel No results for input(s): CHOL, HDL, LDLCALC, TRIG, CHOLHDL, LDLDIRECT in the last 72 hours. Thyroid Function Tests No results for input(s): TSH, T4TOTAL, T3FREE, THYROIDAB in the last 72 hours.  Invalid input(s): FREET3  Other results:   Imaging    No results found.   Medications:     Scheduled Medications:  amiodarone  200 mg Oral BID   apixaban  5 mg Oral BID   atorvastatin  80 mg Oral Daily   Chlorhexidine Gluconate Cloth  6 each Topical Daily   clopidogrel  75 mg Oral Daily   dapagliflozin propanediol  10 mg Oral Daily   insulin aspart  0-9 Units Subcutaneous TID WC   midodrine  2.5 mg Oral TID WC   pantoprazole  40 mg Oral Q1200   sodium  chloride flush  3 mL Intravenous Q12H   sodium chloride flush  3 mL Intravenous Q12H    Infusions:  sodium chloride 5 mL/hr at 08/26/20 0400   milrinone 0.25 mcg/kg/min (08/26/20 0400)    PRN Medications: sodium chloride, acetaminophen, lidocaine (PF), sodium chloride flush    Assessment/Plan   1. CAD: H/o DES to LCX in 3/19, had occluded PLV at that time. Admitted with anterior STEMI 5/22 and occluded mLAD, had DES to mLAD, known CTO PLV with collaterals and patent LCx stent.  No chest pain now.  - Continue Plavix, no ASA with apixaban use.  - Continue atorvastatin 80. 2. AKI on CKD IIIb: Creatinine 3.93>4.34>3.70>2.58>2.28>1.76>2.14.  Suspect combination of low output and low volume status, improved with milrinone and gentle rehydration, now creatinine back up with vigorous diuresis yesterday.  - Hold torsemide again today, when restart diuretic will likely need lower dose than torsemide 20 mg daily.  - Continue Farxiga.   - Continue milrinone for now.  - BP improved, keep current midodrine today but stop tomorrow probably.  - Does not need HD.  3. 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  TEE 05/22 showing EF 20-25%, moderately decreased RV function with mild RVE, moderate biatrial enlargement, PASP 34, mild MR, mild TR.  Cardiogenic shock during 5/22 admission, was eventually able to titrate off IABP and milrinone.  He has struggled since discharge, admitted this time with with AKI and prominent fatigue, cool extremities, low output HF. Echo this admit with EF 25%, RV okay, mild MR, mild AI.  Milrinone 0.25 started, Swan placed.  He remains on milrinone 0.25, suspect milrinone-dependent but will attempt wean when creatinine trends back down (2.14 today after diuresis yesterday).  - Stop torsemide.  - Can continue Farxiga 10 mg daily.  - Stay off spironolactone,  hydralazine/imdur for now.  - Continue midodrine 2.5 mg TID until creatinine stabilizes again.  - Off digoxin with recent AKI and high level/toxicity. - Continue milrinone 0.25 mcg/kg/min.  - Concerning clinical picture with suspected recurrent low output HF.  With improvement in creatinine on milrinone, can consider LVAD.  Will have LVAD coordinator following now.  Palliative care is also following.  4. COPD: Prior smoker 5. Atrial flutter: Atypical, had DCCV in 5/22 but now back in atrial flutter.  TEE-guided DCCV on 7/25.    - Continue amiodarone 200 mg bid.  - Continue apixaban.    Can go to step down.  Loralie Champagne 08/26/2020 7:53 AM

## 2020-08-26 NOTE — TOC Progression Note (Addendum)
Transition of Care (TOC) - Progression Note  Heart Failure   Patient Details  Name: Douglas Villa MRN: [id number] Date of Birth: 01-26-1951  Transition of Care [facility]) CM/SW [place], [place] Phone Number: 08/26/2020, 10:04 AM  Clinical Narrative:    Mr. Keisling reported that he does not receive Food Stamps. CSW sent an application to [facility] in May 2022 on another one of the patient's hospital admission visits. CSW again sent Mr. [facility] application for [employer]. CSW met with outpatient HF social worker to discuss housing and unfortunately this is a barrier with not many possible options available.   CSW will continue to follow throughout discharge.     Barriers to Discharge: Continued Medical Work up  Expected Discharge Plan and Services   In-house Referral: Clinical Social Work     Living arrangements for the past 2 months: Hotel/Motel                                       Social Determinants of Health (SDOH) Interventions Food Insecurity Interventions: Assist with [employer] Strain Interventions: Development worker, community Housing Interventions: Other (Comment) (Referral to HF outpatient to see about options) Transportation Interventions: Intervention Not Indicated  Readmission Risk Interventions No flowsheet data found.  Jia Mohamed, MSW, [place] Heart Failure Social Worker

## 2020-08-26 NOTE — TOC Initial Note (Signed)
Transition of Care [place]) - Initial/Assessment Note    Patient Details  Name: Douglas Villa MRN: [id number] Date of Birth: Oct 03, 1950  Transition of Care [facility]) CM/SW Contact:    Erenest Rasher, RN Phone Number: [phone] 08/26/2020, 4:56 PM  Clinical Narrative:                 TOC CM spoke to pt at bedside. States he lives in [facility], [place] pew week. States he wants permanent housing. Agreeable to [address]. Pt states he cannot afford Eliquis and Farxiga. Does not have drug coverage. He is able to drive to his appts or his ex wife will assist. Offered choice for Palliative Services with [facility]. Contacted rep with new referral and they will follow up post dc. [place] for RW with seat to be deliver to room prior to dc.   Expected Discharge Plan: Home/Self Care Barriers to Discharge: Continued Medical Work up   Patient Goals and CMS Choice Patient states their goals for this hospitalization and ongoing recovery are:: states he wants permanent housing CMS Medicare.gov Compare Post Acute Care list provided to:: Patient Choice offered to / list presented to : Patient  Expected Discharge Plan and Services Expected Discharge Plan: Home/Self Care In-house Referral: Clinical Social Work Discharge Planning Services: CM Consult Post Acute Care Choice: Hospice (Palliative) Living arrangements for the past 2 months: Hotel/Motel                   DME Agency: [employer] Date DME Agency Contacted: 08/26/20 Time DME Agency Contacted: [phone] Representative spoke with at DME Agency: Adela Lank            Prior Living Arrangements/Services Living arrangements for the past 2 months: Hotel/Motel Lives with:: Self Patient language and need for interpreter reviewed:: Yes Do you feel safe going back to the place where you live?: Yes      Need for Family Participation in Patient Care: No (Comment) Care giver support system in place?: No  (comment)   Criminal Activity/Legal Involvement Pertinent to Current Situation/Hospitalization: No - Comment as needed  Activities of Daily Living Home Assistive Devices/Equipment: Eyeglasses ADL Screening (condition at time of admission) Patient's cognitive ability adequate to safely complete daily activities?: Yes Is the patient deaf or have difficulty hearing?: No Does the patient have difficulty seeing, even when wearing glasses/contacts?: No Does the patient have difficulty concentrating, remembering, or making decisions?: No Patient able to express need for assistance with ADLs?: Yes Does the patient have difficulty dressing or bathing?: No Independently performs ADLs?: No Communication: Independent Dressing (OT): Needs assistance Is this a change from baseline?: Change from baseline, expected to last <3days Grooming: Independent Feeding: Independent Bathing: Needs assistance Is this a change from baseline?: Change from baseline, expected to last <3 days Toileting: Needs assistance Is this a change from baseline?: Change from baseline, expected to last <3 days In/Out Bed: Needs assistance Is this a change from baseline?: Change from baseline, expected to last <3 days Walks in Home: Needs assistance Is this a change from baseline?: Change from baseline, expected to last <3 days Does the patient have difficulty walking or climbing stairs?: No Weakness of Legs: None Weakness of Arms/Hands: None  Permission Sought/Granted Permission sought to share information with : Case Manager, PCP, Family Supports Permission granted to share information with : Yes, Verbal Permission Granted  Share Information with NAME: Torin Modica     Permission granted to share info  w Relationship: ex wife     Emotional Assessment Appearance:: Appears stated age Attitude/Demeanor/Rapport: Engaged Affect (typically observed): Accepting Orientation: : Oriented to Self, Oriented to Place, Oriented  to  Time, Oriented to Situation   Psych Involvement: No (comment)  Admission diagnosis:  Acute systolic heart failure ([place]) [I50.21] Acute kidney injury ([place]) [N17.9] Patient Active Problem List   Diagnosis Date Noted   Acute systolic heart failure ([place]) 08/20/2020   AKI (acute kidney injury) ([place]) 07/17/2020   Hyperkalemia    Atrial fibrillation ([place]) 06/29/2020   Tricuspid regurgitation 06/29/2020   Anemia 06/29/2020   Hyponatremia 06/29/2020   Thrombocytopenia ([place]) 06/29/2020   STEMI involving left anterior descending coronary artery ([place]) 06/20/2020   Acute ST elevation myocardial infarction (STEMI) due to occlusion of distal portion of left anterior descending (LAD) coronary artery (HCC)    Cardiogenic shock ([place])    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/18/2020   Healthcare maintenance 09/16/2017   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   Obstructive sleep apnea 08/05/2017   Hyperlipidemia 06/03/2017   Acute on chronic systolic heart failure ([place]) 06/03/2017   Chronic kidney disease, stage 3 ([place]) 06/03/2017   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[place]) 06/03/2017   Essential hypertension 06/03/2017   Atherosclerotic heart disease of native coronary artery with unstable angina pectoris ([place]) 06/03/2017   Ex-smoker 06/03/2017   PCP:  Lillard Anes, MD Pharmacy:   [place], [place] [address] [place] [address] Phone: [phone] Fax: [place], [place] - [address] [address] [place] [address] Phone: [phone] Fax: [place] [address] [place] [address] Phone: [phone] Fax: [phone]     Social Determinants of Health (SDOH) Interventions Food Insecurity Interventions: Assist with SNAP Application Financial Strain Interventions: Development worker, community Housing Interventions:  Other (Comment) (Referral to HF outpatient to see about options) Transportation Interventions: Intervention Not Indicated  Readmission Risk Interventions No flowsheet data found.

## 2020-08-26 NOTE — Progress Notes (Signed)
[facility])  Referral received for outpatient palliative care services once Douglas Villa d/c's home, likely later this week.  ACC will continue to follow.  Venia Carbon RN, BSN, [facility] Liaison

## 2020-08-27 ENCOUNTER — Other Ambulatory Visit (HOSPITAL_COMMUNITY): Payer: Self-pay

## 2020-08-27 LAB — CBC
HCT: 32.4 % — ABNORMAL LOW (ref 39.0–52.0)
Hemoglobin: 10.8 g/dL — ABNORMAL LOW (ref 13.0–17.0)
MCH: 28.7 pg (ref 26.0–34.0)
MCHC: 33.3 g/dL (ref 30.0–36.0)
MCV: 86.2 fL (ref 80.0–100.0)
Platelets: 141 10*3/uL — ABNORMAL LOW (ref 150–400)
RBC: 3.76 MIL/uL — ABNORMAL LOW (ref 4.22–5.81)
RDW: 17.5 % — ABNORMAL HIGH (ref 11.5–15.5)
WBC: 4.4 10*3/uL (ref 4.0–10.5)
nRBC: 0 % (ref 0.0–0.2)

## 2020-08-27 LAB — GLUCOSE, CAPILLARY
Glucose-Capillary: 90 mg/dL (ref 70–99)
Glucose-Capillary: 95 mg/dL (ref 70–99)
Glucose-Capillary: 134 mg/dL — ABNORMAL HIGH (ref 70–99)
Glucose-Capillary: 145 mg/dL — ABNORMAL HIGH (ref 70–99)

## 2020-08-27 LAB — BASIC METABOLIC PANEL WITH GFR
Anion gap: 6 (ref 5–15)
BUN: 21 mg/dL (ref 8–23)
CO2: 22 mmol/L (ref 22–32)
Calcium: 8.4 mg/dL — ABNORMAL LOW (ref 8.9–10.3)
Chloride: 106 mmol/L (ref 98–111)
Creatinine, Ser: 1.88 mg/dL — ABNORMAL HIGH (ref 0.61–1.24)
GFR, Estimated: 37 mL/min — ABNORMAL LOW
Glucose, Bld: 87 mg/dL (ref 70–99)
Potassium: 3.6 mmol/L (ref 3.5–5.1)
Sodium: 134 mmol/L — ABNORMAL LOW (ref 135–145)

## 2020-08-27 LAB — COOXEMETRY PANEL
Carboxyhemoglobin: 1 % (ref 0.5–1.5)
Methemoglobin: 0.8 % (ref 0.0–1.5)
O2 Saturation: 81.7 %
Total hemoglobin: 11.1 g/dL — ABNORMAL LOW (ref 12.0–16.0)

## 2020-08-27 MED ORDER — ONDANSETRON HCL 4 MG/2ML IJ SOLN
4.0000 mg | Freq: Three times a day (TID) | INTRAMUSCULAR | Status: DC | PRN
  Administered 2020-08-27 – 2020-09-01 (×2): 4 mg via INTRAVENOUS
  Filled 2020-08-27 (×2): qty 2

## 2020-08-27 MED ORDER — OXYCODONE HCL 5 MG PO TABS
5.0000 mg | ORAL_TABLET | Freq: Once | ORAL | Status: AC
  Administered 2020-08-27: 5 mg via ORAL
  Filled 2020-08-27: qty 1

## 2020-08-27 MED ORDER — POTASSIUM CHLORIDE CRYS ER 20 MEQ PO TBCR
40.0000 meq | EXTENDED_RELEASE_TABLET | Freq: Once | ORAL | Status: AC
  Administered 2020-08-27: 40 meq via ORAL
  Filled 2020-08-27: qty 2

## 2020-08-27 NOTE — Progress Notes (Addendum)
Palliative:  Consult Reason: Goals of care and LVAD evaluation/discussion  HPI: 70 y.o. male  with past medical history of CHF EF 25-30%, CAD (s/p stenting 2019), cardiomyopathy, HTN, and smoking (quit smoking in 2/19, but now back to 1 PPD), COPD, CKD stage 4 admitted on 08/20/2020 with CHF and cardiogenic shock with renal failure for treatment with initiation of milrinone. Renal function has improved. Now consideration of LVAD.   I met today with Mr. Sprigg. He is awake and alert but complains of headache and nausea which seemed to improve over the course of our conversation. We had a long discussion regarding his goals of care and the potential of LVAD as well. We discussed expectations with surgery, recovery, and caregiving needs with LVAD. Mr. Shenker has many concerns about being a burden to his friends and family and he does not like the idea of having to rely on others.   Upon further conversation he shares that he has outlived all the men on his father's side of the family. Most of his family (parents and siblings) have already passed on. He reflects on the peaceful deaths of his family. He shares that he is not afraid of death but does question the unknown of what happens after he dies. He understands that there is much to be done to ensure he does not have to suffer at the end of his life. He shares that he has had a full life and cannot think of anything he has left that he feels he needs to do before he dies. He tells me that he is leaning towards not pursuing LVAD but has not made a final decision. He understands that there are barriers and that there could be something that could come up and make him not a candidate anyway. He seems at peace with his poor prognosis even without LVAD.   We also discussed code status at length and I encouraged him to consider DNR especially if LVAD is not a good option for him. He does not want to be left on life support. Mr. Brager has good understanding  and will consider his options. We discussed HCPOA/Living Will which would be good for him to complete once he decides how to move forward with his care (although he is unsure who would be his HCPOA - even more reason for him to documents his wishes).   All questions/concerns addressed. Emotional support provided.   Exam: Alert, oriented. No distress. Good spirits. Breathing regular, unlabored. Abd soft, flat. Moves all extremities. Generalizes fatigue and weakness.    Plan: - I will follow up Monday to continue conversation. Please call [phone] for further palliative follow up in the meantime.   [id number] 75 min  Divya Munshi, NP Palliative Medicine Team Pager [phone] (Please see [email or web address] for schedule) Team Phone [phone]    Greater than 50%  of this time was spent counseling and coordinating care related to the above assessment and plan

## 2020-08-27 NOTE — Progress Notes (Signed)
Patient ID: Douglas Villa, male   DOB: 22-Jun-1950, 70 y.o.   MRN: [id number]     Advanced Heart Failure Rounding Note  PCP-Cardiologist: Dr. Aundra Dubin   Subjective:    07/20: Admit with malaise, N/V, fatigue and low output HF w/ AKI on CKD. Started milrinone 0.25.  07/21: Nephrology consulted. Considering HD. Diuretics held.  Creatinine 4.34 > 3.70 > 2.58 > 2.28 > 1.76 > 2.14 > 1.88.  He remains on milrinone 0.25.   Walked in hall today, denies dyspnea.    Diuretics held yesterday, creatinine lower and CVP 4.  Co-ox 82%.   TEE-guided DCCV to NSR on 7/25, NSR today.   - Echo 08/21/2020: EF 25%, apical akinesis, GIIDD, RV okay,   RVSP 26 mmHg, mild MR, mild AI - TEE (7/22): EF 20%, diffuse hypokinesis, mildly decreased RV systolic function.   RHC Procedural Findings on milrinone 0.25: Hemodynamics (mmHg) RA mean 1 RV 25/1 PA 28/9, mean 16 PCWP mean 3 Oxygen saturations: PA 65% AO 99% Cardiac Output (Fick) 4.14  Cardiac Index (Fick) 2.3 Cardiac Output (Thermo) 3.72  Cardiac Index (Thermo) 2.07  Objective:   Weight Range: 75.1 kg Body mass index is 25.93 kg/m.   Vital Signs:   Temp:  [97.8 F (36.6 C)-98.2 F (36.8 C)] 98 F (36.7 C) (07/27 0700) Pulse Rate:  [66-82] 72 (07/27 0504) Resp:  [12-24] 20 (07/27 0504) BP: (111-144)/(63-92) 135/79 (07/27 0504) SpO2:  [98 %-100 %] 100 % (07/27 0504) Weight:  [75.1 kg] 75.1 kg (07/27 0500) Last BM Date: 08/25/20  Weight change: Filed Weights   08/25/20 0418 08/26/20 0459 08/27/20 0500  Weight: 75.4 kg 76 kg 75.1 kg    Intake/Output:   Intake/Output Summary (Last 24 hours) at 08/27/2020 0850 Last data filed at 08/27/2020 0800 Gross per 24 hour  Intake 932.87 ml  Output 1600 ml  Net -667.13 ml      Physical Exam    General: NAD Neck: No JVD, no thyromegaly or thyroid nodule.  Lungs: Clear to auscultation bilaterally with normal respiratory effort. CV: Lateral PMI.  Heart regular S1/S2, no S3/S4, no murmur.   No peripheral edema.   Abdomen: Soft, nontender, no hepatosplenomegaly, no distention.  Skin: Intact without lesions or rashes.  Neurologic: Alert and oriented x 3.  Psych: Normal affect. Extremities: No clubbing or cyanosis.  HEENT: Normal.    Telemetry   NSR 80s (personally reviewed)  Labs    CBC Recent Labs    08/26/20 0511 08/27/20 0439  WBC 4.1 4.4  HGB 11.6* 10.8*  HCT 34.6* 32.4*  MCV 85.9 86.2  PLT 160 268*   Basic Metabolic Panel Recent Labs    08/26/20 0511 08/27/20 0439  NA 137 134*  K 3.3* 3.6  CL 107 106  CO2 23 22  GLUCOSE 89 87  BUN 27* 21  CREATININE 2.14* 1.88*  CALCIUM 8.3* 8.4*   Liver Function Tests No results for input(s): AST, ALT, ALKPHOS, BILITOT, PROT, ALBUMIN in the last 72 hours. No results for input(s): LIPASE, AMYLASE in the last 72 hours. Cardiac Enzymes No results for input(s): CKTOTAL, CKMB, CKMBINDEX, TROPONINI in the last 72 hours.  BNP: BNP (last 3 results) Recent Labs    07/22/20 0109 07/23/20 0436 08/20/20 1002  BNP 2,071.8* 1,879.9* 338.1*    ProBNP (last 3 results) No results for input(s): PROBNP in the last 8760 hours.   D-Dimer No results for input(s): DDIMER in the last 72 hours. Hemoglobin A1C No results for input(s): HGBA1C  in the last 72 hours.  Fasting Lipid Panel No results for input(s): CHOL, HDL, LDLCALC, TRIG, CHOLHDL, LDLDIRECT in the last 72 hours. Thyroid Function Tests No results for input(s): TSH, T4TOTAL, T3FREE, THYROIDAB in the last 72 hours.  Invalid input(s): FREET3  Other results:   Imaging    No results found.   Medications:     Scheduled Medications:  amiodarone  200 mg Oral BID   apixaban  5 mg Oral BID   atorvastatin  80 mg Oral Daily   Chlorhexidine Gluconate Cloth  6 each Topical Daily   clopidogrel  75 mg Oral Daily   dapagliflozin propanediol  10 mg Oral Daily   insulin aspart  0-9 Units Subcutaneous TID WC   midodrine  2.5 mg Oral TID WC   oxyCODONE  5 mg  Oral Once   pantoprazole  40 mg Oral Q1200   sodium chloride flush  3 mL Intravenous Q12H   sodium chloride flush  3 mL Intravenous Q12H    Infusions:  sodium chloride 5 mL/hr at 08/27/20 0500   milrinone 0.25 mcg/kg/min (08/27/20 0500)    PRN Medications: sodium chloride, acetaminophen, lidocaine (PF), sodium chloride flush    Assessment/Plan   1. CAD: H/o DES to LCX in 3/19, had occluded PLV at that time. Admitted with anterior STEMI 5/22 and occluded mLAD, had DES to mLAD, known CTO PLV with collaterals and patent LCx stent.  No chest pain now.  - Continue Plavix, no ASA with apixaban use.  - Continue atorvastatin 80. 2. AKI on CKD IIIb: Creatinine 3.93>4.34>3.70>2.58>2.28>1.76>2.14>1.88.  Suspect combination of low output and low volume status, improved with milrinone and gentle rehydration.  CVP still low at 4.  - Hold diuretic again today.  - Continue Farxiga.   - Continue milrinone for now.  - Current midodrine for now.  3. 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  TEE 05/22 showing EF 20-25%, moderately decreased RV function with mild RVE, moderate biatrial enlargement, PASP 34, mild MR, mild TR.  Cardiogenic shock during 5/22 admission, was eventually able to titrate off IABP and milrinone.  He has struggled since discharge, admitted this time with with AKI and prominent fatigue, cool extremities, low output HF. Echo this admit with EF 25%, RV okay, mild MR, mild AI. TEE with EF 20%, mild RV dysfunction.  Milrinone 0.25 started, Swan placed.  He remains on milrinone 0.25, suspect milrinone-dependent but will attempt wean starting today.  CVP 4, co-ox 82%.   - No diuretic.  - Decrease milrinone to 0.125 today to see if patient will tolerate coming down.  - Can continue Farxiga 10 mg daily.  - Stay off spironolactone, hydralazine/imdur for now.  - Continue midodrine 2.5  mg TID for now.  - Off digoxin with recent AKI and high level/toxicity. - Concerning clinical picture with recurrent low output HF.  With improvement in creatinine on milrinone, now considering LVAD.  Renal function is a concern but has improved.  RV looks ok.  Social situation is an issue, ex-wife hopefully will come in today and will see if she can support him post-op.    4. COPD: Prior smoker 5. Atrial flutter: Atypical, had DCCV in 5/22 but now back in atrial flutter.  TEE-guided DCCV on 7/25.  Remains in NSR since DCCV.  - Continue amiodarone 200 mg bid.  - Continue apixaban.    Can go to step  down.  Loralie Champagne 08/27/2020 8:50 AM

## 2020-08-27 NOTE — Progress Notes (Signed)
Physical Therapy Treatment Patient Details Name: Douglas Villa MRN: [id number] DOB: 1950-06-26 Today's Date: 08/27/2020    History of Present Illness Pt is a 70 y.o. yo male  who was admitted on 08/20/2020 with acute on chronic systolic heart failure. Echo with EF 25%; underwent Rt heart cath; AKI with temporary dialysis catheter placed but HD not yet needed;   PMH CAD, cardiomyopathy, HTN, paroxysmal A. fib, COPD, CKD 3b and T2DM    PT Comments    Pt mentioned not feeling well today with headache, but tolerated treatment well. Pt ambulated an increased distance without RW compared to previous session. Pt mentioned feeling like he was ambulating at his normal gait speed, and using the RW for steadiness. Progress to challenges with walking speed and balance.   Follow Up Recommendations  Home health PT     Equipment Recommendations  None recommended by PT    Recommendations for Other Services       Precautions / Restrictions Precautions Precautions: Fall Restrictions Weight Bearing Restrictions: No    Mobility  Bed Mobility Overal bed mobility: Modified Independent Bed Mobility: Supine to Sit     Supine to sit: HOB elevated;Modified independent (Device/Increase time)     General bed mobility comments: assist with lines only    Transfers Overall transfer level: Modified independent Equipment used: None                Ambulation/Gait Ambulation/Gait assistance: Min guard Gait Distance (Feet): 400 Feet Assistive device: None Gait Pattern/deviations: Step-through pattern Gait velocity: Increased Gait velocity interpretation: >4.37 ft/sec, indicative of normal walking speed General Gait Details: Ambulated without RW. Demonstrated step-through gait pattern. Pt did not feel unsteady on feet. Demonstrated slight lateral sway during ambulation   Stairs             Wheelchair Mobility    Modified Rankin (Stroke Patients Only)       Balance Overall  balance assessment: No apparent balance deficits (not formally assessed)                                          Cognition Arousal/Alertness: Awake/alert Behavior During Therapy: WFL for tasks assessed/performed Overall Cognitive Status: Within Functional Limits for tasks assessed                                 General Comments: Responded accurately to commands      Exercises      General Comments        Pertinent Vitals/Pain Pain Assessment: 0-10 Pain Score: 3  Pain Location: Headache Pain Intervention(s): Monitored during session;Repositioned    Home Living                      Prior Function            PT Goals (current goals can now be found in the care plan section) Acute Rehab PT Goals Patient Stated Goal: return home PT Goal Formulation: With patient Time For Goal Achievement: 09/06/20 Potential to Achieve Goals: Good Progress towards PT goals: Progressing toward goals    Frequency    Min 3X/week      PT Plan Current plan remains appropriate    Co-evaluation              AM-PAC PT "6 Clicks"  Mobility   Outcome Measure  Help needed turning from your back to your side while in a flat bed without using bedrails?: None Help needed moving from lying on your back to sitting on the side of a flat bed without using bedrails?: None Help needed moving to and from a bed to a chair (including a wheelchair)?: None Help needed standing up from a chair using your arms (e.g., wheelchair or bedside chair)?: None Help needed to walk in hospital room?: None Help needed climbing 3-5 steps with a railing? : A Little 6 Click Score: 23    End of Session Equipment Utilized During Treatment: Gait belt Activity Tolerance: Patient tolerated treatment well Patient left: in bed;with nursing/sitter in room;with bed alarm set Nurse Communication: Mobility status PT Visit Diagnosis: Difficulty in walking, not elsewhere  classified (R26.2)     Time: 4403-4742 PT Time Calculation (min) (ACUTE ONLY): 18 min  Charges:  $Gait Training: 8-22 mins                     Louie Casa, SPT Acute Rehab: [phone]    Domingo Dimes 08/27/2020, 4:47 PM

## 2020-08-27 NOTE — Progress Notes (Signed)
VAD Coordinator and VAD Social Worker, Raquel Sarna met with patient this am to continue discussion about VAD evaluation.  Pt remains unsure at this time if he is interested in starting VAD evaluation. He says he wants to wait and speak with his ex-wife before making the decision. She did not come yesterday as planned and is supposed to come today between 2 - 3 pm. Patient asked if we could meet with her to explain caregiver role for LVAD patient. We asked BS nurse to call us when she arrives.  Kennyth Lose spoke with patient about his insurance situation. He has Medicare A (inpatient) only, he will need to purchase Medicare B (outpatient and Milrinone coverage) and Medicare D (medication) coverage. She explained since he did not get Medicare B when he started Medicare A, he will be required to pay a monthly penalty fee for each year he has not had Medicare B and D. He understands this will decrease his monthly income, and would like to know amount. Kennyth Lose will check and get back with patient. Registration for these additional Medicare coverage can only be obtained during open enrollment (end of year) and would go into affect January 2023.  Due to patient's monthly income, he will not qualify for housing or drug assistance. He has not started application for his desired apartment. Kennyth Lose instructed him to start this asap if he wants to move out of motel, but plan on paying full market value for his monthly rent. Pt verbalized understanding of above.  VAD Coordinator went to patient's room after 3 pm to meet with ex-wife. She has not arrived. PT working with patient. Informed patient if his ex-wife wants or needs to speak with me, with his permission, I can call her. He wants to speak with her first. Told him bedside nurse can contact VAD Coordinator if I am needed; he verbalized understanding of same.   Zada Girt RN, VAD Coordinator 24/7 VAD pager: [phone]

## 2020-08-27 NOTE — TOC Progression Note (Signed)
Transition of Care (TOC) - Progression Note  Heart Failure   Patient Details  Name: Douglas Villa MRN: [id number] Date of Birth: 1950-06-14  Transition of Care [facility]) CM/SW [place], [place] Phone Number: 08/27/2020, 5:19 PM  Clinical Narrative:    CSW spoke with the patient at bedside regarding his Medicare coverage and that he only has Part A Medicare not B or D meaning he doesn't have a payor source for medication or outpatient services and open enrollment isn't until January. CSW provided [name] with the [facility] phone number [phone] and he called while CSW was in the room with him. SHIIP informed him that open enrollment isn't until January and at this time he will have to wait until open enrollment to make changes to his Medicare. [name] tried asking how much Medicare B and D will cost him including the penalty as he didn't sign up for those programs 5 years ago when he turned 70 years old and SHIIP directed him to call Social Security at [phone] for him to find out how much Medicare B and D will cost including the penalty as they couldn't provide him with that information. Mr. Dec called Social Security while CSW was in the room with him and was left on hold. [name] asked the CSW for the local [place] phone number. CSW provided [name] with a list of phone numbers and address' for Social Security Administration.  CSW will continue to follow throughout discharge.   Expected Discharge Plan: Home/Self Care Barriers to Discharge: Continued Medical Work up  Expected Discharge Plan and Services Expected Discharge Plan: Home/Self Care In-house Referral: Clinical Social Work Discharge Planning Services: CM Consult Post Acute Care Choice: Hospice (Palliative) Living arrangements for the past 2 months: Hotel/Motel                   DME Agency: [employer] Date DME Agency Contacted:  08/26/20 Time DME Agency Contacted: [phone] Representative spoke with at DME Agency: Adela Lank             Social Determinants of Health ([place]) Interventions Food Insecurity Interventions: Assist with [employer] Application Financial Strain Interventions: Development worker, community Housing Interventions: Other (Comment) (Referral to HF outpatient to see about options) Transportation Interventions: Intervention Not Indicated  Readmission Risk Interventions No flowsheet data found.  Cyriah Childrey, MSW, [place] Heart Failure Social Worker

## 2020-08-28 ENCOUNTER — Encounter (HOSPITAL_COMMUNITY): Payer: Self-pay | Admitting: Cardiology

## 2020-08-28 LAB — GLUCOSE, CAPILLARY
Glucose-Capillary: 89 mg/dL (ref 70–99)
Glucose-Capillary: 134 mg/dL — ABNORMAL HIGH (ref 70–99)
Glucose-Capillary: 165 mg/dL — ABNORMAL HIGH (ref 70–99)
Glucose-Capillary: 169 mg/dL — ABNORMAL HIGH (ref 70–99)

## 2020-08-28 LAB — COOXEMETRY PANEL
Carboxyhemoglobin: 0.9 % (ref 0.5–1.5)
Methemoglobin: 0.8 % (ref 0.0–1.5)
O2 Saturation: 61.2 %
Total hemoglobin: 11.4 g/dL — ABNORMAL LOW (ref 12.0–16.0)

## 2020-08-28 LAB — CBC
HCT: 32.9 % — ABNORMAL LOW (ref 39.0–52.0)
Hemoglobin: 10.7 g/dL — ABNORMAL LOW (ref 13.0–17.0)
MCH: 28.2 pg (ref 26.0–34.0)
MCHC: 32.5 g/dL (ref 30.0–36.0)
MCV: 86.8 fL (ref 80.0–100.0)
Platelets: 164 10*3/uL (ref 150–400)
RBC: 3.79 MIL/uL — ABNORMAL LOW (ref 4.22–5.81)
RDW: 17.5 % — ABNORMAL HIGH (ref 11.5–15.5)
WBC: 4.8 10*3/uL (ref 4.0–10.5)
nRBC: 0 % (ref 0.0–0.2)

## 2020-08-28 LAB — RENAL FUNCTION PANEL
Albumin: 2.5 g/dL — ABNORMAL LOW (ref 3.5–5.0)
Anion gap: 7 (ref 5–15)
BUN: 19 mg/dL (ref 8–23)
CO2: 24 mmol/L (ref 22–32)
Calcium: 8.6 mg/dL — ABNORMAL LOW (ref 8.9–10.3)
Chloride: 103 mmol/L (ref 98–111)
Creatinine, Ser: 1.88 mg/dL — ABNORMAL HIGH (ref 0.61–1.24)
GFR, Estimated: 37 mL/min — ABNORMAL LOW (ref >60)
Glucose, Bld: 81 mg/dL (ref 70–99)
Phosphorus: 3 mg/dL (ref 2.5–4.6)
Potassium: 4.1 mmol/L (ref 3.5–5.1)
Sodium: 134 mmol/L — ABNORMAL LOW (ref 135–145)

## 2020-08-28 NOTE — Progress Notes (Signed)
VAD Coordinators met with patient and his ex-wife to continue VAD education.   Provided brief equipment overview and demonstration with HeartMate III training loop including discussion on the following:   a) system controller   b) universal Charity fundraiser   c) battery clips   d) Batteries   E) black back up bag   Identified the following lifestyle modifications while living on MCS:   1. No driving for at least three months and then only if doctor gives permission to do so.   2. No tub baths while pump implanted, and shower only when doctor gives permission.   3. No swimming or submersion in water while implanted with pump.   4. Exit site care including dressing changes, monitoring for infection, and importance of keeping percutaneous lead stabilized at all times.   Discussed caregiver role including staying with patient 24/7 for at least two weeks. Also discussed need to learn sterile technique for dressing changes daily until advanced to weekly. Weekly clinic visits weekly in early post op phase along with frequent lab visits for INR management.   Ex-wife states she doesn't think she would be able to be his caregiver for the following reasons: Starting new job Monday, won't be able to take any time off.  She and patient do not live together, and she has no room for him in her home.  Patient has no one else that could serve as back up caregiver in case she gets sick or can't provide care for him.   Discussed patient insurance status, he called Social Security today and found his payment would be 250.00 monthly for Medicare B and D. He says he will not be able to apply for this until January, February, or March of 2023.   Milrinone weaned off today; plan to re-check co-ox in am per Dr. Aundra Dubin.   VAD Coordinator will touch base with patient tomorrow morning re: co-ox results.  Zada Girt RN, [place] Coordinator [phone]

## 2020-08-28 NOTE — Care Management Important Message (Signed)
Important Message  Patient Details  Name: Douglas Villa MRN: [id number] Date of Birth: 11-25-1950   Medicare Important Message Given:  Yes     Orbie Pyo 08/28/2020, 2:32 PM

## 2020-08-28 NOTE — TOC Progression Note (Signed)
Transition of Care (TOC) - Progression Note  Heart Failure   Patient Details  Name: Douglas Villa MRN: [id number] Date of Birth: 01-26-1951  Transition of Care [facility]) CM/SW [place], [place] Phone Number: 08/28/2020, 12:13 PM  Clinical Narrative:    CSW spoke with the patient at bedside and provided him with a CAFA application per request of Kennyth Lose the HF outpatient CSW to help with Mr. Donley medical bills.   CSW will continue to follow throughout discharge.   Expected Discharge Plan: Home/Self Care Barriers to Discharge: Continued Medical Work up  Expected Discharge Plan and Services Expected Discharge Plan: Home/Self Care In-house Referral: Clinical Social Work Discharge Planning Services: CM Consult Post Acute Care Choice: Hospice (Palliative) Living arrangements for the past 2 months: Hotel/Motel                   DME Agency: [employer] Date DME Agency Contacted: 08/26/20 Time DME Agency Contacted: [phone] Representative spoke with at DME Agency: Adela Lank             Social Determinants of Health ([place]) Interventions Food Insecurity Interventions: Assist with [employer] Application Financial Strain Interventions: Development worker, community Housing Interventions: Other (Comment) (Referral to HF outpatient to see about options) Transportation Interventions: Intervention Not Indicated  Readmission Risk Interventions No flowsheet data found.  Moncia Annas, MSW, [place] Heart Failure Social Worker

## 2020-08-28 NOTE — Progress Notes (Addendum)
Patient ID: Douglas Villa, male   DOB: Jul 09, 1950, 70 y.o.   MRN: [id number]     Advanced Heart Failure Rounding Note  PCP-Cardiologist: Dr. Aundra Dubin   Subjective:    On milrinone 0.125. Co-ox 61%.   AKI resolved, SCr 1.8 and back to baseline.   CVP still low at 3.   Maintaining NSR  No complaints today. Denies dyspnea. Slept well.   Cardiac Studies  - Echo 08/21/2020: EF 25%, apical akinesis, GIIDD, RV okay,   RVSP 26 mmHg, mild MR, mild AI - TEE (7/22): EF 20%, diffuse hypokinesis, mildly decreased RV systolic function.   RHC Procedural Findings on milrinone 0.25: Hemodynamics (mmHg) RA mean 1 RV 25/1 PA 28/9, mean 16 PCWP mean 3 Oxygen saturations: PA 65% AO 99% Cardiac Output (Fick) 4.14  Cardiac Index (Fick) 2.3 Cardiac Output (Thermo) 3.72  Cardiac Index (Thermo) 2.07  Objective:   Weight Range: 76.2 kg Body mass index is 26.3 kg/m.   Vital Signs:   Temp:  [97.4 F (36.3 C)-98.5 F (36.9 C)] 98.4 F (36.9 C) (07/28 0732) Pulse Rate:  [64-81] 81 (07/28 0400) Resp:  [15-24] 17 (07/28 0400) BP: (112-142)/(71-94) 135/87 (07/28 0400) SpO2:  [97 %-100 %] 100 % (07/28 0400) Weight:  [76.2 kg] 76.2 kg (07/28 0026) Last BM Date: 08/25/20  Weight change: Filed Weights   08/26/20 0459 08/27/20 0500 08/28/20 0026  Weight: 76 kg 75.1 kg 76.2 kg    Intake/Output:   Intake/Output Summary (Last 24 hours) at 08/28/2020 0938 Last data filed at 08/28/2020 0821 Gross per 24 hour  Intake 783.48 ml  Output 950 ml  Net -166.52 ml      Physical Exam  CVP 3  General:  Well appearing. No respiratory difficulty HEENT: normal Neck: supple. no JVD. Carotids 2+ bilat; no bruits. No lymphadenopathy or thyromegaly appreciated. Cor: PMI nondisplaced. Regular rate & rhythm. No rubs, gallops or murmurs. Lungs: clear Abdomen: soft, nontender, nondistended. No hepatosplenomegaly. No bruits or masses. Good bowel sounds. Extremities: no cyanosis, clubbing, rash,  edema Neuro: alert & oriented x 3, cranial nerves grossly intact. moves all 4 extremities w/o difficulty. Affect pleasant.    Telemetry   NSR 80s (personally reviewed)  Labs    CBC Recent Labs    08/27/20 0439 08/28/20 0440  WBC 4.4 4.8  HGB 10.8* 10.7*  HCT 32.4* 32.9*  MCV 86.2 86.8  PLT 141* 765   Basic Metabolic Panel Recent Labs    08/27/20 0439 08/28/20 0440  NA 134* 134*  K 3.6 4.1  CL 106 103  CO2 22 24  GLUCOSE 87 81  BUN 21 19  CREATININE 1.88* 1.88*  CALCIUM 8.4* 8.6*  PHOS  --  3.0   Liver Function Tests Recent Labs    08/28/20 0440  ALBUMIN 2.5*   No results for input(s): LIPASE, AMYLASE in the last 72 hours. Cardiac Enzymes No results for input(s): CKTOTAL, CKMB, CKMBINDEX, TROPONINI in the last 72 hours.  BNP: BNP (last 3 results) Recent Labs    07/22/20 0109 07/23/20 0436 08/20/20 1002  BNP 2,071.8* 1,879.9* 338.1*    ProBNP (last 3 results) No results for input(s): PROBNP in the last 8760 hours.   D-Dimer No results for input(s): DDIMER in the last 72 hours. Hemoglobin A1C No results for input(s): HGBA1C in the last 72 hours.  Fasting Lipid Panel No results for input(s): CHOL, HDL, LDLCALC, TRIG, CHOLHDL, LDLDIRECT in the last 72 hours. Thyroid Function Tests No results for input(s): TSH, T4TOTAL,  T3FREE, THYROIDAB in the last 72 hours.  Invalid input(s): FREET3  Other results:   Imaging    No results found.   Medications:     Scheduled Medications:  amiodarone  200 mg Oral BID   apixaban  5 mg Oral BID   atorvastatin  80 mg Oral Daily   Chlorhexidine Gluconate Cloth  6 each Topical Daily   clopidogrel  75 mg Oral Daily   dapagliflozin propanediol  10 mg Oral Daily   insulin aspart  0-9 Units Subcutaneous TID WC   midodrine  2.5 mg Oral TID WC   pantoprazole  40 mg Oral Q1200   sodium chloride flush  3 mL Intravenous Q12H   sodium chloride flush  3 mL Intravenous Q12H    Infusions:  sodium chloride 5  mL/hr at 08/27/20 1600   milrinone 0.125 mcg/kg/min (08/27/20 1600)    PRN Medications: sodium chloride, acetaminophen, lidocaine (PF), ondansetron (ZOFRAN) IV, sodium chloride flush    Assessment/Plan   1. CAD: H/o DES to LCX in 3/19, had occluded PLV at that time. Admitted with anterior STEMI 5/22 and occluded mLAD, had DES to mLAD, known CTO PLV with collaterals and patent LCx stent.  No chest pain now.  - Continue Plavix, no ASA with apixaban use.  - Continue atorvastatin 80. 2. AKI on CKD IIIb: Creatinine 3.93>4.34>3.70>2.58>2.28>1.76>2.14>1.88>1.88.  Suspect combination of low output and low volume status, improved with milrinone and gentle rehydration.  CVP still low at 3.  - Hold diuretic again today.  - Continue Farxiga.   - Stop milrinone and follow co-ox  - Current midodrine for now.  3. 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  TEE 05/22 showing EF 20-25%, moderately decreased RV function with mild RVE, moderate biatrial enlargement, PASP 34, mild MR, mild TR.  Cardiogenic shock during 5/22 admission, was eventually able to titrate off IABP and milrinone.  He has struggled since discharge, admitted this time with with AKI and prominent fatigue, cool extremities, low output HF. Echo this admit with EF 25%, RV okay, mild MR, mild AI. TEE with EF 20%, mild RV dysfunction.  Milrinone 0.25 started, Swan placed.  He remains on milrinone 0.25, suspect milrinone-dependent but will c/w wean.  CVP 3, co-ox 61%.   - No diuretic.  - Stop milrinone and follow co-ox  - Can continue Farxiga 10 mg daily.  - Stay off spironolactone, hydralazine/imdur for now.  - Continue midodrine 2.5 mg TID for now.  - Off digoxin with recent AKI and high level/toxicity. - Concerning clinical picture with recurrent low output HF.  With improvement in creatinine on milrinone, we considered LVAD.  Social  situation is an issue. Little no no support at home. Also w/o insurance. LVAD currently no longer an option.  4. COPD: Prior smoker 5. Atrial flutter: Atypical, had DCCV in 5/22 but now back in atrial flutter.  TEE-guided DCCV on 7/25.  Remains in NSR since DCCV.  - Continue amiodarone 200 mg bid.  - Continue apixaban.    Douglas Jester, Douglas Villa  08/28/2020 9:38 AM  Patient seen with PA, agree with the above note.   Milrinone decreased to 0.125 yesterday, creatinine stable at 1.88 and CVP 3.   He is not nauseated this morning.   General: NAD Neck: No JVD, no thyromegaly or thyroid nodule.  Lungs: Clear to auscultation bilaterally with normal respiratory effort. CV: Lateral PMI.  Heart  regular S1/S2, no S3/S4, no murmur.  No peripheral edema.  Abdomen: Soft, nontender, no hepatosplenomegaly, no distention.  Skin: Intact without lesions or rashes.  Neurologic: Alert and oriented x 3.  Psych: Normal affect. Extremities: No clubbing or cyanosis.  HEENT: Normal.   Stop milrinone today and follow creatinine/co-ox in am.  No diuretic today.  BP to soft to add meds, will continue low dose midodrine.    Social worker has been researching extensively, but at this time, it does not look like home milrinone or LVAD will be an option due to lack of insurance and barriers to getting on covering insurance.  Nonetheless, will talk with his ex-wife to see if she would be willing to be a caregiver if we get him to the point where he could get an LVAD.  Our only option at this point is going to be to wean him off milrinone.   Douglas Villa 08/28/2020 12:12 PM

## 2020-08-28 NOTE — Plan of Care (Signed)

## 2020-08-29 ENCOUNTER — Encounter (HOSPITAL_COMMUNITY): Payer: Self-pay

## 2020-08-29 ENCOUNTER — Other Ambulatory Visit (HOSPITAL_COMMUNITY): Payer: Self-pay

## 2020-08-29 ENCOUNTER — Inpatient Hospital Stay (HOSPITAL_COMMUNITY): Payer: Medicare Other

## 2020-08-29 LAB — URIC ACID: Uric Acid, Serum: 4.6 mg/dL (ref 3.7–8.6)

## 2020-08-29 LAB — BASIC METABOLIC PANEL
Anion gap: 6 (ref 5–15)
BUN: 17 mg/dL (ref 8–23)
CO2: 23 mmol/L (ref 22–32)
Calcium: 8.5 mg/dL — ABNORMAL LOW (ref 8.9–10.3)
Chloride: 104 mmol/L (ref 98–111)
Creatinine, Ser: 1.73 mg/dL — ABNORMAL HIGH (ref 0.61–1.24)
GFR, Estimated: 41 mL/min — ABNORMAL LOW (ref >60)
Glucose, Bld: 106 mg/dL — ABNORMAL HIGH (ref 70–99)
Potassium: 4 mmol/L (ref 3.5–5.1)
Sodium: 133 mmol/L — ABNORMAL LOW (ref 135–145)

## 2020-08-29 LAB — GLUCOSE, CAPILLARY
Glucose-Capillary: 106 mg/dL — ABNORMAL HIGH (ref 70–99)
Glucose-Capillary: 143 mg/dL — ABNORMAL HIGH (ref 70–99)
Glucose-Capillary: 79 mg/dL (ref 70–99)
Glucose-Capillary: 158 mg/dL — ABNORMAL HIGH (ref 70–99)

## 2020-08-29 LAB — CBC
HCT: 35 % — ABNORMAL LOW (ref 39.0–52.0)
Hemoglobin: 11.4 g/dL — ABNORMAL LOW (ref 13.0–17.0)
MCH: 28.2 pg (ref 26.0–34.0)
MCHC: 32.6 g/dL (ref 30.0–36.0)
MCV: 86.6 fL (ref 80.0–100.0)
Platelets: 179 10*3/uL (ref 150–400)
RBC: 4.04 MIL/uL — ABNORMAL LOW (ref 4.22–5.81)
RDW: 17.7 % — ABNORMAL HIGH (ref 11.5–15.5)
WBC: 6.3 10*3/uL (ref 4.0–10.5)
nRBC: 0 % (ref 0.0–0.2)

## 2020-08-29 LAB — COOXEMETRY PANEL
Carboxyhemoglobin: 1 % (ref 0.5–1.5)
Methemoglobin: 1 % (ref 0.0–1.5)
O2 Saturation: 53.7 %
Total hemoglobin: 12.4 g/dL (ref 12.0–16.0)

## 2020-08-29 MED ORDER — ATORVASTATIN CALCIUM 80 MG PO TABS
80.0000 mg | ORAL_TABLET | Freq: Every day | ORAL | 0 refills | Status: DC
  Filled 2020-08-29: qty 30, 30d supply, fill #0

## 2020-08-29 MED ORDER — APIXABAN 5 MG PO TABS
5.0000 mg | ORAL_TABLET | Freq: Two times a day (BID) | ORAL | 2 refills | Status: DC
Start: 2020-08-29 — End: 2020-09-01
  Filled 2020-08-29: qty 60, 30d supply, fill #0

## 2020-08-29 MED ORDER — TORSEMIDE 20 MG PO TABS
10.0000 mg | ORAL_TABLET | Freq: Every day | ORAL | Status: DC
  Administered 2020-08-29 – 2020-08-31 (×3): 10 mg via ORAL
  Filled 2020-08-29 (×3): qty 1

## 2020-08-29 MED ORDER — ISOSORBIDE MONONITRATE ER 30 MG PO TB24
30.0000 mg | ORAL_TABLET | Freq: Every day | ORAL | 0 refills | Status: DC
  Filled 2020-08-29: qty 30, 30d supply, fill #0

## 2020-08-29 MED ORDER — CLOPIDOGREL BISULFATE 75 MG PO TABS
75.0000 mg | ORAL_TABLET | Freq: Every day | ORAL | 2 refills | Status: DC
  Filled 2020-08-29: qty 30, 30d supply, fill #0

## 2020-08-29 MED ORDER — NITROGLYCERIN 0.4 MG SL SUBL
0.4000 mg | SUBLINGUAL_TABLET | SUBLINGUAL | 12 refills | Status: DC | PRN
  Filled 2020-08-29: qty 25, 14d supply, fill #0

## 2020-08-29 MED ORDER — DICLOFENAC SODIUM 1 % EX GEL
2.0000 g | Freq: Every day | CUTANEOUS | Status: DC | PRN
  Administered 2020-08-29 – 2020-08-30 (×2): 2 g via TOPICAL
  Filled 2020-08-29: qty 100

## 2020-08-29 MED ORDER — AMIODARONE HCL 200 MG PO TABS
200.0000 mg | ORAL_TABLET | Freq: Two times a day (BID) | ORAL | 3 refills | Status: DC
  Filled 2020-08-29: qty 60, 30d supply, fill #0

## 2020-08-29 MED ORDER — COLCHICINE 0.6 MG PO TABS
0.6000 mg | ORAL_TABLET | Freq: Every day | ORAL | Status: DC
  Administered 2020-08-29 – 2020-09-01 (×4): 0.6 mg via ORAL
  Filled 2020-08-29 (×4): qty 1

## 2020-08-29 MED ORDER — SPIRONOLACTONE 25 MG PO TABS
25.0000 mg | ORAL_TABLET | Freq: Every day | ORAL | 0 refills | Status: DC
Start: 2020-08-29 — End: 2020-09-01
  Filled 2020-08-29: qty 30, 30d supply, fill #0

## 2020-08-29 MED ORDER — TORSEMIDE 10 MG PO TABS
10.0000 mg | ORAL_TABLET | Freq: Every day | ORAL | 2 refills | Status: DC
  Filled 2020-08-29: qty 30, 30d supply, fill #0

## 2020-08-29 NOTE — Progress Notes (Signed)
Orthopedic surgery consulted due to complaints of left elbow swelling/pain -- recommended treatment for gout for now until team can formally evaluate him tomorrow.  Colchicine 0.6 mg daily started.

## 2020-08-29 NOTE — Progress Notes (Signed)
This chaplain responded to PMT consult for spiritual care.  The Pt. is awake and open to time with the chaplain.  The chaplain understands the Pt. is experiencing pain in his left elbow. The pain level is an 8 today and a 10 yesterday. The Pt. shared with the chaplain an xray is scheduled for today.   The chaplain listened reflectively as the Pt. shared the LVAD education topics. The chaplain understands the Pt. is discerning the information and whether the LVAD fits into the Pt. definition of quality of life.  The chaplain understands the Pt. is leaning into his faith in God and lived wisdom, along with the  resources available to him as he reflects.    The Pt. accepted the chaplain's invitation for prayer and F/U spiritual care. The chaplain and Pt. agree to pursue the Pt. Advance Directive: HCPOA and Living Will on Monday.

## 2020-08-29 NOTE — Progress Notes (Signed)
Physical Therapy Treatment Patient Details Name: Douglas Villa MRN: [id number] DOB: February 01, 1951 Today's Date: 08/29/2020    History of Present Illness Pt is a 70 y.o. yo male  who was admitted on 08/20/2020 with acute on chronic systolic heart failure. Echo with EF 25%; underwent Rt heart cath; AKI with temporary dialysis catheter placed but HD not yet needed;   PMH CAD, cardiomyopathy, HTN, paroxysmal A. fib, COPD, CKD 3b and T2DM    PT Comments    Pt tolerates treatment well, performing multiple dynamic gait tasks. Pt demonstrates mild instability with lateral and vertical head turns and is minimally able to change gait speed at this time. Pt will benefit from continued dynamic gait and balance training to aide in a return to the patient's prior level of function.   Follow Up Recommendations  Home health PT     Equipment Recommendations  None recommended by PT    Recommendations for Other Services       Precautions / Restrictions Precautions Precautions: Fall Restrictions Weight Bearing Restrictions: No    Mobility  Bed Mobility Overal bed mobility: Modified Independent Bed Mobility: Supine to Sit     Supine to sit: Modified independent (Device/Increase time);HOB elevated          Transfers Overall transfer level: Modified independent Equipment used: None             General transfer comment: increased time  Ambulation/Gait Ambulation/Gait assistance: Supervision Gait Distance (Feet): 400 Feet Assistive device: None Gait Pattern/deviations: Step-through pattern Gait velocity: functional Gait velocity interpretation: >2.62 ft/sec, indicative of community ambulatory General Gait Details: pt performs dynamic gait tasks including head turns with mild loss of balance, minimally able to change gait speed. Pt weaves through obstacles and is able to change stride length to step over objects   Stairs             Wheelchair Mobility    Modified Rankin  (Stroke Patients Only)       Balance Overall balance assessment: Mild deficits observed, not formally tested                                          Cognition Arousal/Alertness: Awake/alert Behavior During Therapy: WFL for tasks assessed/performed Overall Cognitive Status: Within Functional Limits for tasks assessed                                        Exercises      General Comments General comments (skin integrity, edema, etc.): VSS on RA      Pertinent Vitals/Pain Pain Assessment: Faces Faces Pain Scale: Hurts even more Pain Location: L elbow Pain Descriptors / Indicators: Aching Pain Intervention(s): Monitored during session    Home Living                      Prior Function            PT Goals (current goals can now be found in the care plan section) Acute Rehab PT Goals Patient Stated Goal: return home Progress towards PT goals: Progressing toward goals    Frequency    Min 3X/week      PT Plan Current plan remains appropriate    Co-evaluation  AM-PAC PT "6 Clicks" Mobility   Outcome Measure  Help needed turning from your back to your side while in a flat bed without using bedrails?: None Help needed moving from lying on your back to sitting on the side of a flat bed without using bedrails?: None Help needed moving to and from a bed to a chair (including a wheelchair)?: None Help needed standing up from a chair using your arms (e.g., wheelchair or bedside chair)?: None Help needed to walk in hospital room?: A Little Help needed climbing 3-5 steps with a railing? : A Little 6 Click Score: 22    End of Session   Activity Tolerance: Patient tolerated treatment well Patient left: in bed;with call bell/phone within reach Nurse Communication: Mobility status PT Visit Diagnosis: Difficulty in walking, not elsewhere classified (R26.2)     Time: 9458-5929 PT Time Calculation (min)  (ACUTE ONLY): 13 min  Charges:  $Gait Training: 8-22 mins                     Zenaida Niece, PT, DPT Acute Rehabilitation Pager: [phone]    Zenaida Niece 08/29/2020, 5:23 PM

## 2020-08-29 NOTE — TOC CM/SW Note (Signed)
HF TOC CM message sent to attending for possible meds from [place] today if he discharging over weekend. [place] [place] ED TOC CM [phone]

## 2020-08-29 NOTE — Progress Notes (Addendum)
Patient ID: Douglas Villa, male   DOB: March 20, 1950, 70 y.o.   MRN: [id number]     Advanced Heart Failure Rounding Note  PCP-Cardiologist: Dr. Aundra Dubin   Subjective:    Lying in bed in no acute distress complaining of left elbow pain and swelling that inhibited sleep.  Milrinone stopped yesterday and co-ox decreased to 53.7 from 61.2.  Weight increased 3 lb over the past 48 hours and he is one pound above admission weight.  CVP still low at 4-5 with 1400 of urine output yesterday.     Renal function stable -- creatinine at 1.7 this morning and near baseline.    Maintaining NSR  Cardiac Studies  - Echo 08/21/2020: EF 25%, apical akinesis, GIIDD, RV okay,   RVSP 26 mmHg, mild MR, mild AI - TEE (7/22): EF 20%, diffuse hypokinesis, mildly decreased RV systolic function.  - TEE (7/28): Pending interpretation  [facility] Procedural Findings on milrinone 0.25: Hemodynamics (mmHg) RA mean 1 RV 25/1 PA 28/9, mean 16 PCWP mean 3 Oxygen saturations: PA 65% AO 99% Cardiac Output (Fick) 4.14  Cardiac Index (Fick) 2.3 Cardiac Output (Thermo) 3.72  Cardiac Index (Thermo) 2.07  Objective:   Weight Range: 76.4 kg Body mass index is 26.39 kg/m.   Vital Signs:   Temp:  [98.4 F (36.9 C)-99 F (37.2 C)] 98.5 F (36.9 C) (07/29 0736) Pulse Rate:  [79-93] 79 (07/29 0500) Resp:  [16-23] 23 (07/29 0500) BP: (128-139)/(90-93) 128/90 (07/29 0500) SpO2:  [99 %-100 %] 99 % (07/29 0500) Weight:  [76.4 kg] 76.4 kg (07/29 0000) Last BM Date: 08/28/20  Weight change: Filed Weights   08/27/20 0500 08/28/20 0026 08/29/20 0000  Weight: 75.1 kg 76.2 kg 76.4 kg    Intake/Output:   Intake/Output Summary (Last 24 hours) at 08/29/2020 0925 Last data filed at 08/29/2020 4098 Gross per 24 hour  Intake 1260 ml  Output 1275 ml  Net -15 ml       Physical Exam  CVP 3  General:  Well appearing. No respiratory difficulty HEENT: normal Neck: supple. Minimal JVD. Carotids 2+ bilat; no bruits. No  lymphadenopathy or thyromegaly appreciated. Cor: PMI nondisplaced. Regular rate & rhythm. No rubs, gallops or murmurs. Lungs: clear Abdomen: soft, nontender, nondistended. No hepatosplenomegaly. No bruits or masses. Good bowel sounds. Extremities: no cyanosis, clubbing, rash, edema Neuro: alert & oriented x 3, cranial nerves grossly intact. moves all 4 extremities w/o difficulty. Affect pleasant.    Telemetry   NSR 80s (personally reviewed)  Labs    CBC Recent Labs    08/28/20 0440 08/29/20 0525  WBC 4.8 6.3  HGB 10.7* 11.4*  HCT 32.9* 35.0*  MCV 86.8 86.6  PLT 164 119    Basic Metabolic Panel Recent Labs    08/28/20 0440 08/29/20 0525  NA 134* 133*  K 4.1 4.0  CL 103 104  CO2 24 23  GLUCOSE 81 106*  BUN 19 17  CREATININE 1.88* 1.73*  CALCIUM 8.6* 8.5*  PHOS 3.0  --     Liver Function Tests Recent Labs    08/28/20 0440  ALBUMIN 2.5*    No results for input(s): LIPASE, AMYLASE in the last 72 hours. Cardiac Enzymes No results for input(s): CKTOTAL, CKMB, CKMBINDEX, TROPONINI in the last 72 hours.  BNP: BNP (last 3 results) Recent Labs    07/22/20 0109 07/23/20 0436 08/20/20 1002  BNP 2,071.8* 1,879.9* 338.1*     ProBNP (last 3 results) No results for input(s): PROBNP in the last 8760  hours.   D-Dimer No results for input(s): DDIMER in the last 72 hours. Hemoglobin A1C No results for input(s): HGBA1C in the last 72 hours.  Fasting Lipid Panel No results for input(s): CHOL, HDL, LDLCALC, TRIG, CHOLHDL, LDLDIRECT in the last 72 hours. Thyroid Function Tests No results for input(s): TSH, T4TOTAL, T3FREE, THYROIDAB in the last 72 hours.  Invalid input(s): FREET3  Other results:   Imaging    No results found.   Medications:     Scheduled Medications:  amiodarone  200 mg Oral BID   apixaban  5 mg Oral BID   atorvastatin  80 mg Oral Daily   Chlorhexidine Gluconate Cloth  6 each Topical Daily   clopidogrel  75 mg Oral Daily    dapagliflozin propanediol  10 mg Oral Daily   insulin aspart  0-9 Units Subcutaneous TID WC   midodrine  2.5 mg Oral TID WC   pantoprazole  40 mg Oral Q1200   sodium chloride flush  3 mL Intravenous Q12H   sodium chloride flush  3 mL Intravenous Q12H    Infusions:  sodium chloride 5 mL/hr at 08/27/20 1600    PRN Medications: sodium chloride, acetaminophen, diclofenac Sodium, lidocaine (PF), ondansetron (ZOFRAN) IV, sodium chloride flush    Assessment/Plan   1. CAD: H/o DES to LCX in 3/19, had occluded PLV at that time. Admitted with anterior STEMI 5/22 and occluded mLAD, had DES to mLAD, known CTO PLV with collaterals and patent LCx stent.  No chest pain now.  - Continue Plavix, no ASA with apixaban use.  - Continue atorvastatin 80. 2. AKI on CKD IIIb: Creatinine 3.93>4.34>3.70>2.58>2.28>1.76>2.14>1.88>1.88>1.73.  Suspect combination of low output and low volume status, improved with milrinone and gentle rehydration.  CVP still low at 4-5.  - Hold diuretic again today and monitor UOP closely  - Continue Farxiga.   - Co-ox decreasing -- now 53.7 from 61.2 off milrinone - Current midodrine for now.  3. 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  TEE 05/22 showing EF 20-25%, moderately decreased RV function with mild RVE, moderate biatrial enlargement, PASP 34, mild MR, mild TR.  Cardiogenic shock during 5/22 admission, was eventually able to titrate off IABP and milrinone.  He has struggled since discharge, admitted this time with with AKI and prominent fatigue, cool extremities, low output HF. Echo this admit with EF 25%, RV okay, mild MR, mild AI. TEE with EF 20%, mild RV dysfunction.  Milrinone discontinued on 7/28, but SvO2 decreasing.  Will monitor closely  - TEE on 7/28 pending interpretation  - No diuretic.  - Can continue Farxiga 10 mg daily.  - Stay off spironolactone,  hydralazine/imdur for now.  - Continue midodrine 2.5 mg TID for now.  - Off digoxin with recent AKI and high level/toxicity. - Concerning clinical picture with recurrent low output HF.  With improvement in creatinine on milrinone, we considered LVAD.  Social situation is an issue. Little no no support at home. Also w/o insurance. LVAD currently no longer an option.  4. COPD: Prior smoker 5. Atrial flutter: Atypical, had DCCV in 5/22 but now back in atrial flutter.  TEE-guided DCCV on 7/25.  Remains in NSR since DCCV.  - Continue amiodarone 200 mg bid.  - Continue apixaban.   6. Left elbow pain - Patient has history of gout, which typically occurs in his right foot.  Elbow swollen, warm to touch  and extremely tender to touch.  Will obtain xray.  Darcella Gasman, AGACNP-BC  08/29/2020 9:25 AM  Patient seen with NP, agree with the above note.   Co-ox 54% this morning off milrinone.  CVP about 5.  Creatinine 1.73.  No dyspnea.   Main complaint is significant swelling and pain left elbow.  Uric acid level normal.  No leukocytosis or fever.   General: NAD Neck: No JVD, no thyromegaly or thyroid nodule.  Lungs: Clear to auscultation bilaterally with normal respiratory effort. CV: Lateral PMI.  Heart regular S1/S2, no S3/S4, no murmur.  No peripheral edema.  Abdomen: Soft, nontender, no hepatosplenomegaly, no distention.  Skin: Intact without lesions or rashes.  Neurologic: Alert and oriented x 3.  Psych: Normal affect. Extremities: No clubbing or cyanosis.  HEENT: Normal.  MSK: The left elbow is warm, tender, swollen.   Marginal co-ox off milrinone.  As above, he has no insurance coverage for milrinone or LVAD and has no way of getting it in the near term.  He does not have a caregiver for post-LVAD care either.  Therefore, we do not have good options here.  Will keep him off milrinone, follow for another day.  Continue digoxin, Farxiga, midodrine.  Start torsemide 10 mg daily today.    Severe pain/swelling left elbow => no fever or leukocytosis.  He has a history of gout but uric acid was normal yesterday.  - Empiric colchicine 0.6 daily.  - Will ask ortho to take a look, ?tap effusion.   Loralie Champagne 08/29/2020 3:03 PM

## 2020-08-30 LAB — GLUCOSE, CAPILLARY
Glucose-Capillary: 91 mg/dL (ref 70–99)
Glucose-Capillary: 136 mg/dL — ABNORMAL HIGH (ref 70–99)
Glucose-Capillary: 118 mg/dL — ABNORMAL HIGH (ref 70–99)
Glucose-Capillary: 89 mg/dL (ref 70–99)

## 2020-08-30 LAB — BASIC METABOLIC PANEL
Anion gap: 6 (ref 5–15)
BUN: 17 mg/dL (ref 8–23)
CO2: 26 mmol/L (ref 22–32)
Calcium: 8.3 mg/dL — ABNORMAL LOW (ref 8.9–10.3)
Chloride: 103 mmol/L (ref 98–111)
Creatinine, Ser: 1.88 mg/dL — ABNORMAL HIGH (ref 0.61–1.24)
GFR, Estimated: 37 mL/min — ABNORMAL LOW (ref >60)
Glucose, Bld: 95 mg/dL (ref 70–99)
Potassium: 3.3 mmol/L — ABNORMAL LOW (ref 3.5–5.1)
Sodium: 135 mmol/L (ref 135–145)

## 2020-08-30 LAB — COOXEMETRY PANEL
Carboxyhemoglobin: 0.9 % (ref 0.5–1.5)
Methemoglobin: 0.8 % (ref 0.0–1.5)
O2 Saturation: 52.5 %
Total hemoglobin: 11.6 g/dL — ABNORMAL LOW (ref 12.0–16.0)

## 2020-08-30 MED ORDER — LOPERAMIDE HCL 2 MG PO CAPS
2.0000 mg | ORAL_CAPSULE | ORAL | Status: DC | PRN
  Administered 2020-08-30: 2 mg via ORAL
  Filled 2020-08-30: qty 1

## 2020-08-30 MED ORDER — POTASSIUM CHLORIDE CRYS ER 20 MEQ PO TBCR
30.0000 meq | EXTENDED_RELEASE_TABLET | Freq: Once | ORAL | Status: AC
  Administered 2020-08-30: 30 meq via ORAL
  Filled 2020-08-30: qty 1

## 2020-08-30 NOTE — Consult Note (Addendum)
Orthopaedic Trauma Service (OTS) Consult   Patient ID: Douglas Villa MRN: [id number] DOB/AGE: 1950/06/17 70 y.o.  Reason for Consult: Left elbow pain/swelling Referring Physician: Thurnell Lose, NP  HPI: Douglas Villa is an 70 y.o. male with PMH significant for CAD, cardiomyopathy, HTN, COPD being seen in consultation at the request of NP Carollo for left elbow pain/swelling.  Patient injured [facility] on 08/20/2020 for acute kidney injury.  Patient has been having increased pain/swelling in left elbow over the last few days.  This has affected his ability to sleep.  Orthopedics was patient consulted on 08/29/2020 for evaluation and management. Patient seen this morning on 3E. Currently notes pain in left elbow is improving.  Has less tenderness with palpation and notes improved range of motion.  He does have a history of gout in the past, and often affecting his great toe.  Past Medical History:  Diagnosis Date   Abnormal EKG 03/2009   CAD (coronary artery disease)    CKD (chronic kidney disease)    Heart failure with preserved left ventricular function (HFpEF) ([place])    Hypertension    OSA (obstructive sleep apnea)    Sexual dysfunction    Type 2 diabetes mellitus with diabetic nephropathy ([place]) 06/18/2020    Past Surgical History:  Procedure Laterality Date   APPENDECTOMY  1969   ARTERIAL LINE INSERTION N/A 06/20/2020   Procedure: ARTERIAL LINE INSERTION;  Surgeon: Troy Sine, MD;  Location: [place] CV LAB;  Service: Cardiovascular;  Laterality: N/A;   CARDIAC CATHETERIZATION     CARDIOVERSION N/A 06/27/2020   Procedure: CARDIOVERSION;  Surgeon: Larey Dresser, MD;  Location: [facility] ENDOSCOPY;  Service: Cardiovascular;  Laterality: N/A;   CARDIOVERSION N/A 08/25/2020   Procedure: CARDIOVERSION;  Surgeon: Larey Dresser, MD;  Location: [facility] ENDOSCOPY;  Service: Cardiovascular;  Laterality: N/A;   CENTRAL LINE INSERTION  06/20/2020   Procedure: CENTRAL LINE INSERTION;   Surgeon: Troy Sine, MD;  Location: [place] CV LAB;  Service: Cardiovascular;;   CORONARY ANGIOPLASTY     CORONARY/GRAFT ACUTE MI REVASCULARIZATION N/A 06/20/2020   Procedure: Coronary/Graft Acute MI Revascularization;  Surgeon: Troy Sine, MD;  Location: [place] CV LAB;  Service: Cardiovascular;  Laterality: N/A;   IABP INSERTION N/A 06/20/2020   Procedure: IABP Insertion;  Surgeon: Troy Sine, MD;  Location: [place] CV LAB;  Service: Cardiovascular;  Laterality: N/A;   IR FLUORO GUIDE CV LINE RIGHT  08/22/2020   RIGHT HEART CATH N/A 08/22/2020   Procedure: RIGHT HEART CATH;  Surgeon: Larey Dresser, MD;  Location: [place] CV LAB;  Service: Cardiovascular;  Laterality: N/A;   RIGHT/LEFT HEART CATH AND CORONARY ANGIOGRAPHY N/A 06/20/2020   Procedure: RIGHT/LEFT HEART CATH AND CORONARY ANGIOGRAPHY;  Surgeon: Troy Sine, MD;  Location: [place] CV LAB;  Service: Cardiovascular;  Laterality: N/A;   TEE WITHOUT CARDIOVERSION N/A 06/27/2020   Procedure: TRANSESOPHAGEAL ECHOCARDIOGRAM (TEE);  Surgeon: Larey Dresser, MD;  Location: [facility] ENDOSCOPY;  Service: Cardiovascular;  Laterality: N/A;   TEE WITHOUT CARDIOVERSION N/A 08/25/2020   Procedure: TRANSESOPHAGEAL ECHOCARDIOGRAM (TEE);  Surgeon: Larey Dresser, MD;  Location: [facility] ENDOSCOPY;  Service: Cardiovascular;  Laterality: N/A;    Family History  Problem Relation Age of Onset   Cancer Mother 15   Stroke Father 1    Social History:  reports that he quit smoking about 2 years ago. His smoking use included cigarettes. He has a 45.00 pack-year smoking history. He  has never used smokeless tobacco. He reports that he does not drink alcohol and does not use drugs.  Allergies:  Allergies  Allergen Reactions   Penicillins Other (See Comments)    Sweating     Medications: I have reviewed the patient's current medications. Prior to Admission:  Medications Prior to Admission  Medication Sig Dispense Refill  Last Dose   ACCU-CHEK FASTCLIX LANCETS MISC 1 each by Does not apply route as directed.   Past Week   albuterol (PROVENTIL HFA;VENTOLIN HFA) 108 (90 Base) MCG/ACT inhaler Inhale 2 puffs into the lungs every 6 (six) hours as needed for wheezing or shortness of breath. 1 Inhaler 2 Past Week   Blood Glucose Monitoring Suppl (ACCU-CHEK AVIVA PLUS) w/Device KIT 1 each by Does not apply route as directed.   Past Week   clopidogrel (PLAVIX) 75 MG tablet TAKE 1 TABLET BY MOUTH ONCE DAILY . APPOINTMENT REQUIRED FOR FUTURE REFILLS (Patient taking differently: Take 75 mg by mouth daily.) 30 tablet 0 Past Week   dapagliflozin propanediol (FARXIGA) 10 MG TABS tablet Take 1 tablet (10 mg total) by mouth daily. 60 tablet 2 Past Week   glucose blood test strip 1 each by Other route as directed.   Past Week   hydrALAZINE (APRESOLINE) 25 MG tablet TAKE 1 & 1/2 (ONE & ONE-HALF) TABLETS BY MOUTH THREE TIMES DAILY. NEED AN APPOINTMENT FOR FUTURE REFILLS (Patient taking differently: Take 37.5 mg by mouth 3 (three) times daily.) 135 tablet 0 Past Week   pantoprazole (PROTONIX) 40 MG tablet Take 1 tablet (40 mg total) by mouth daily at 12 noon.   Past Week   potassium chloride SA (KLOR-CON) 20 MEQ tablet Take 1 tablet (20 mEq total) by mouth daily. 60 tablet 2 Past Week   torsemide (DEMADEX) 20 MG tablet Take 1 tablet (20 mg total) by mouth daily.   Past Week   [DISCONTINUED] amiodarone (PACERONE) 200 MG tablet Take 1 tablet (200 mg total) by mouth 2 (two) times daily. 60 tablet 3 08/19/2020   [DISCONTINUED] apixaban (ELIQUIS) 5 MG TABS tablet Take 1 tablet (5 mg total) by mouth 2 (two) times daily. 120 tablet 2 08/19/2020 at 8pm   [DISCONTINUED] atorvastatin (LIPITOR) 80 MG tablet Take 1 tablet (80 mg total) by mouth daily. Needs appt for future refills 30 tablet 0 Past Week   [DISCONTINUED] isosorbide mononitrate (IMDUR) 30 MG 24 hr tablet Take 1 tablet (30 mg total) by mouth daily. Needs appt for future refills 30 tablet 0  Past Week   [DISCONTINUED] nitroGLYCERIN (NITROSTAT) 0.4 MG SL tablet Place 0.4 mg under the tongue every 5 (five) minutes as needed for chest pain.   unk   [DISCONTINUED] spironolactone (ALDACTONE) 25 MG tablet Take 1 tablet by mouth once daily (Patient taking differently: Take 25 mg by mouth daily.) 90 tablet 0 Past Week   docusate sodium (COLACE) 100 MG capsule Take 1 capsule (100 mg total) by mouth 2 (two) times daily. (Patient not taking: No sig reported) 10 capsule 0 Not Taking   ondansetron (ZOFRAN) 4 MG tablet Take 1 tablet (4 mg total) by mouth every 8 (eight) hours as needed for nausea or vomiting. (Patient not taking: No sig reported) 12 tablet 0 Not Taking    ROS: Constitutional: No fever or chills Vision: No changes in vision ENT: No difficulty swallowing CV: No chest pain Pulm: No SOB or wheezing GI: No nausea or vomiting GU: No urgency or inability to hold urine Skin: No poor wound healing  Neurologic: No numbness or tingling Psychiatric: No depression or anxiety Heme: No bruising Allergic: No reaction to medications or food   Exam: Blood pressure 129/87, pulse 66, temperature (!) 97.5 F (36.4 C), temperature source Oral, resp. rate 20, height $RemoveBe'5\' 7"'LdmAkYrdD$  (1.702 m), weight 74.8 kg, SpO2 100 %. General: Sitting up in bed, NAD Orientation: Alert and oriented x4 Mood and Affect: Mood and affect appropriate, pleasant and cooperative Gait: Within normal limits Coordination and balance: Within normal limits  Left upper extremity: Skin without lesions.  Mild elbow effusion noted.  No significant tenderness with palpation about the elbow.  Able to move the elbow on his but motion slightly limited secondary to pain.  Able to get about 95-100 degrees flexion is about 5 to 10 degrees shy of full elbow extension.  Endorses sensation throughout extremity.  He is neurovascularly intact.  Right upper extremity: Skin without lesions. No tenderness to palpation. Full painless ROM, full  strength in each muscle group without evidence of instability.   Medical Decision Making: Data: Imaging: AP and lateral view of the left elbow shows presence of joint effusion.  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    Labs:  Results for orders placed or performed during the hospital encounter of 08/20/20 (from the past 24 hour(s))  Glucose, capillary     Status: Abnormal   Collection Time: 08/29/20 11:24 AM  Result Value Ref Range   Glucose-Capillary 143 (H) 70 - 99 mg/dL  Uric acid     Status: None   Collection Time: 08/29/20 12:02 PM  Result Value Ref Range   Uric Acid, Serum 4.6 3.7 - 8.6 mg/dL  Glucose, capillary     Status: None   Collection Time: 08/29/20  4:57 PM  Result Value Ref Range   Glucose-Capillary 79 70 - 99 mg/dL   Comment 1 Notify RN   Glucose, capillary     Status: Abnormal   Collection Time: 08/29/20  9:04 PM  Result Value Ref Range   Glucose-Capillary 158 (H) 70 - 99 mg/dL   Comment 1 Notify RN    Comment 2 Document in Chart   .Cooxemetry Panel (carboxy, met, total hgb, O2 sat)     Status: Abnormal   Collection Time: 08/30/20  5:17 AM  Result Value Ref Range   Total hemoglobin 11.6 (L) 12.0 - 16.0 g/dL   O2 Saturation 52.5 %   Carboxyhemoglobin 0.9 0.5 - 1.5 %   Methemoglobin 0.8 0.0 - 1.5 %  Basic metabolic panel     Status: Abnormal   Collection Time: 08/30/20  5:25 AM  Result Value Ref Range   Sodium 135 135 - 145 mmol/L   Potassium 3.3 (L) 3.5 - 5.1 mmol/L   Chloride 103 98 - 111 mmol/L   CO2 26 22 - 32 mmol/L   Glucose, Bld 95 70 - 99 mg/dL   BUN 17 8 - 23 mg/dL   Creatinine, Ser 1.88 (H) 0.61 - 1.24 mg/dL   Calcium 8.3 (L) 8.9 - 10.3 mg/dL   GFR, Estimated 37 (L) >60 mL/min   Anion gap 6 5 - 15  Glucose, capillary     Status: None   Collection Time: 08/30/20  6:26 AM  Result Value Ref Range   Glucose-Capillary 91 70 - 99 mg/dL   Comment 1 Notify RN    Comment 2 Document in Chart      Assessment/Plan: 70 year old male with PMH significant for  CAD, cardiomyopathy,  HTN, and COPD being seen for left elbow swelling/pain.  Patient notes improvement in pain and motion over the last 2 days.  Recommend continue treating empirically for gout.  Will defer elbow aspiration at this time.  Plan to follow-up with patient within next 2 to 3 days to ensure he continues to improve.  Patient is agreeable to this plan.  Please call with any questions or concerns.   Keyondra Lagrand A. [facility] [phone] (office) [email or web address]

## 2020-08-30 NOTE — Progress Notes (Signed)
Patient ID: Douglas Villa, male   DOB: [birth year], 70 y.o.   MRN: [id number]     Advanced Heart Failure Rounding Note  PCP-Cardiologist: Dr. Aundra Dubin   Subjective:    Still having a lot of pain in left elbow, thinks that colchicine helped a little.   Now off milrinone, co-ox 53%.  CVP 8 this morning. Creatinine 1.88, mildly higher.    Maintaining NSR  Cardiac Studies  - Echo 08/21/2020: EF 25%, apical akinesis, GIIDD, RV okay,   RVSP 26 mmHg, mild MR, mild AI - TEE (7/22): EF 20%, diffuse hypokinesis, mildly decreased RV systolic function.   [facility] Procedural Findings on milrinone 0.25: Hemodynamics (mmHg) RA mean 1 RV 25/1 PA 28/9, mean 16 PCWP mean 3 Oxygen saturations: PA 65% AO 99% Cardiac Output (Fick) 4.14  Cardiac Index (Fick) 2.3 Cardiac Output (Thermo) 3.72  Cardiac Index (Thermo) 2.07  Objective:   Weight Range: 74.8 kg Body mass index is 25.81 kg/m.   Vital Signs:   Temp:  [97.5 F (36.4 C)-98.7 F (37.1 C)] 98.1 F (36.7 C) (07/30 1106) Pulse Rate:  [66] 66 (07/30 1106) Resp:  [18-20] 18 (07/30 1106) BP: (94-134)/(67-87) 121/81 (07/30 1106) SpO2:  [98 %-100 %] 100 % (07/30 1106) Weight:  [74.8 kg] 74.8 kg (07/30 0524) Last BM Date: 08/28/20  Weight change: Filed Weights   08/28/20 0026 08/29/20 0000 08/30/20 0524  Weight: 76.2 kg 76.4 kg 74.8 kg    Intake/Output:   Intake/Output Summary (Last 24 hours) at 08/30/2020 1134 Last data filed at 08/30/2020 0843 Gross per 24 hour  Intake 720 ml  Output 2700 ml  Net -1980 ml      Physical Exam  CVP 8  General: NAD Neck: No JVD, no thyromegaly or thyroid nodule.  Lungs: Clear to auscultation bilaterally with normal respiratory effort. CV: Nondisplaced PMI.  Heart regular S1/S2, no S3/S4, no murmur.  No peripheral edema.   Abdomen: Soft, nontender, no hepatosplenomegaly, no distention.  Skin: Intact without lesions or rashes.  Neurologic: Alert and oriented x 3.  Psych: Normal  affect. Extremities: No clubbing or cyanosis.  HEENT: Normal.    Telemetry   NSR 70s (personally reviewed)  Labs    CBC Recent Labs    08/28/20 0440 08/29/20 0525  WBC 4.8 6.3  HGB 10.7* 11.4*  HCT 32.9* 35.0*  MCV 86.8 86.6  PLT 164 244   Basic Metabolic Panel Recent Labs    08/28/20 0440 08/29/20 0525 08/30/20 0525  NA 134* 133* 135  K 4.1 4.0 3.3*  CL 103 104 103  CO2 24 23 26   GLUCOSE 81 106* 95  BUN 19 17 17   CREATININE 1.88* 1.73* 1.88*  CALCIUM 8.6* 8.5* 8.3*  PHOS 3.0  --   --    Liver Function Tests Recent Labs    08/28/20 0440  ALBUMIN 2.5*   No results for input(s): LIPASE, AMYLASE in the last 72 hours. Cardiac Enzymes No results for input(s): CKTOTAL, CKMB, CKMBINDEX, TROPONINI in the last 72 hours.  BNP: BNP (last 3 results) Recent Labs    07/22/20 0109 07/23/20 0436 08/20/20 1002  BNP 2,071.8* 1,879.9* 338.1*    ProBNP (last 3 results) No results for input(s): PROBNP in the last 8760 hours.   D-Dimer No results for input(s): DDIMER in the last 72 hours. Hemoglobin A1C No results for input(s): HGBA1C in the last 72 hours.  Fasting Lipid Panel No results for input(s): CHOL, HDL, LDLCALC, TRIG, CHOLHDL, LDLDIRECT in the last 72  hours. Thyroid Function Tests No results for input(s): TSH, T4TOTAL, T3FREE, THYROIDAB in the last 72 hours.  Invalid input(s): FREET3  Other results:   Imaging    No results found.   Medications:     Scheduled Medications:  amiodarone  200 mg Oral BID   apixaban  5 mg Oral BID   atorvastatin  80 mg Oral Daily   Chlorhexidine Gluconate Cloth  6 each Topical Daily   clopidogrel  75 mg Oral Daily   colchicine  0.6 mg Oral Daily   dapagliflozin propanediol  10 mg Oral Daily   insulin aspart  0-9 Units Subcutaneous TID WC   midodrine  2.5 mg Oral TID WC   pantoprazole  40 mg Oral Q1200   sodium chloride flush  3 mL Intravenous Q12H   sodium chloride flush  3 mL Intravenous Q12H    torsemide  10 mg Oral Daily    Infusions:  sodium chloride 5 mL/hr at 08/27/20 1600    PRN Medications: sodium chloride, acetaminophen, diclofenac Sodium, ondansetron (ZOFRAN) IV, sodium chloride flush    Assessment/Plan   1. CAD: H/o DES to LCX in 3/19, had occluded PLV at that time. Admitted with anterior STEMI 5/22 and occluded mLAD, had DES to mLAD, known CTO PLV with collaterals and patent LCx stent.  No chest pain now.  - Continue Plavix, no ASA with apixaban use.  - Continue atorvastatin 80. 2. AKI on CKD IIIb: Creatinine 3.93>4.34>3.70>2.58>2.28>1.76>2.14>1.88>1.88>1.73>1.88.  Suspect combination of low output and low volume status, improved with milrinone and gentle rehydration.  CVP now 8, back on low dose torsemide.   3. 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  TEE 05/22 showing EF 20-25%, moderately decreased RV function with mild RVE, moderate biatrial enlargement, PASP 34, mild MR, mild TR.  Cardiogenic shock during 5/22 admission, was eventually able to titrate off IABP and milrinone.  He has struggled since discharge, admitted this time with with AKI and prominent fatigue, cool extremities, low output HF. Echo this admit with EF 25%, RV okay, mild MR, mild AI. TEE with EF 20%, mild RV dysfunction.  Milrinone discontinued on 7/28, co-ox 53% today.  CVP 8.   - Continue torsemide 10 mg daily.   - Can continue Farxiga 10 mg daily.  - Stay off spironolactone, hydralazine/imdur for now.  - Continue midodrine 2.5 mg TID for now.  - Off digoxin with recent AKI and high level/toxicity. - Concerning clinical picture with recurrent low output HF.  With improvement in creatinine on milrinone, we considered LVAD.  Social situation is an issue. He has no insurance coverage for home milrinone or LVAD and has no way of getting it in the near term.  He does not have a caregiver for  post-LVAD care either.  Therefore, we do not have good options here.   4. COPD: Prior smoker 5. Atrial flutter: Atypical, had DCCV in 5/22 but now back in atrial flutter.  TEE-guided DCCV on 7/25.  Remains in NSR since DCCV.  - Continue amiodarone 200 mg bid.  - Continue apixaban.   6. Left elbow pain: Patient has history of gout, which typically occurs in his right foot.  Elbow swollen, warm to touch and extremely tender to touch.  Plain film with effusion.  Uric acid normal, ?pseudogout. - Some improvement with colchicine but still very painful. - Orthopedics to see today.   Does not think  he can manage at home alone with elbow pain right now.  Will follow for 1 more day here and try to discharge tomorrow if pain is improved.  Unfortunately, though co-ox is marginal, there is no way for [place] to get him on home milrinone and despite the best efforts of social work, looks like there is no way to get coverage for LVAD.   Loralie Champagne 08/30/2020 11:34 AM

## 2020-08-30 NOTE — Progress Notes (Signed)
Encouraged patient in the morning and afternoon to walk in the hallway but pt refused. I will continue to monitor and educate the patient.

## 2020-08-31 LAB — GLUCOSE, CAPILLARY
Glucose-Capillary: 66 mg/dL — ABNORMAL LOW (ref 70–99)
Glucose-Capillary: 96 mg/dL (ref 70–99)
Glucose-Capillary: 169 mg/dL — ABNORMAL HIGH (ref 70–99)
Glucose-Capillary: 106 mg/dL — ABNORMAL HIGH (ref 70–99)
Glucose-Capillary: 225 mg/dL — ABNORMAL HIGH (ref 70–99)

## 2020-08-31 LAB — BASIC METABOLIC PANEL
Anion gap: 9 (ref 5–15)
BUN: 20 mg/dL (ref 8–23)
CO2: 26 mmol/L (ref 22–32)
Calcium: 8.3 mg/dL — ABNORMAL LOW (ref 8.9–10.3)
Chloride: 102 mmol/L (ref 98–111)
Creatinine, Ser: 1.85 mg/dL — ABNORMAL HIGH (ref 0.61–1.24)
GFR, Estimated: 38 mL/min — ABNORMAL LOW (ref >60)
Glucose, Bld: 68 mg/dL — ABNORMAL LOW (ref 70–99)
Potassium: 3.3 mmol/L — ABNORMAL LOW (ref 3.5–5.1)
Sodium: 137 mmol/L (ref 135–145)

## 2020-08-31 LAB — COOXEMETRY PANEL
Carboxyhemoglobin: 0.8 % (ref 0.5–1.5)
Methemoglobin: 1 % (ref 0.0–1.5)
O2 Saturation: 57.1 %
Total hemoglobin: 11.9 g/dL — ABNORMAL LOW (ref 12.0–16.0)

## 2020-08-31 LAB — MAGNESIUM: Magnesium: 1.5 mg/dL — ABNORMAL LOW (ref 1.7–2.4)

## 2020-08-31 MED ORDER — POTASSIUM CHLORIDE CRYS ER 20 MEQ PO TBCR: 20.0000 meq | EXTENDED_RELEASE_TABLET | Freq: Every day | ORAL | Status: DC

## 2020-08-31 MED ORDER — POTASSIUM CHLORIDE CRYS ER 20 MEQ PO TBCR
40.0000 meq | EXTENDED_RELEASE_TABLET | Freq: Once | ORAL | Status: AC
  Administered 2020-08-31: 40 meq via ORAL
  Filled 2020-08-31: qty 2

## 2020-08-31 MED ORDER — MAGNESIUM SULFATE 4 GM/100ML IV SOLN
4.0000 g | Freq: Once | INTRAVENOUS | Status: AC
  Administered 2020-08-31: 4 g via INTRAVENOUS
  Filled 2020-08-31: qty 100

## 2020-08-31 NOTE — Plan of Care (Signed)

## 2020-08-31 NOTE — Progress Notes (Signed)
Patient ID: Douglas Villa, male   DOB: 23-May-1950, 70 y.o.   MRN: [id number]      Advanced Heart Failure Rounding Note  PCP-Cardiologist: Dr. Aundra Dubin   Subjective:    Left elbow improved, orthopedics thinks it is gout.  Colchicine has helped though it upsets his stomach.   Now off milrinone, co-ox 57%.  CVP 7 this morning. Creatinine 1.88 => 1.85.   Maintaining NSR  Cardiac Studies  - Echo 08/21/2020: EF 25%, apical akinesis, GIIDD, RV okay,   RVSP 26 mmHg, mild MR, mild AI - TEE (7/22): EF 20%, diffuse hypokinesis, mildly decreased RV systolic function.   [facility] Procedural Findings on milrinone 0.25: Hemodynamics (mmHg) RA mean 1 RV 25/1 PA 28/9, mean 16 PCWP mean 3 Oxygen saturations: PA 65% AO 99% Cardiac Output (Fick) 4.14  Cardiac Index (Fick) 2.3 Cardiac Output (Thermo) 3.72  Cardiac Index (Thermo) 2.07  Objective:   Weight Range: 73.8 kg Body mass index is 25.5 kg/m.   Vital Signs:   Temp:  [97.9 F (36.6 C)-98.2 F (36.8 C)] 97.9 F (36.6 C) (07/31 0728) Pulse Rate:  [66-67] 67 (07/30 1645) Resp:  [15-20] 15 (07/31 0600) BP: (121-128)/(76-82) 123/78 (07/31 0600) SpO2:  [96 %-100 %] 100 % (07/31 0600) Weight:  [73.8 kg] 73.8 kg (07/31 0600) Last BM Date: 08/30/20  Weight change: Filed Weights   08/29/20 0000 08/30/20 0524 08/31/20 0600  Weight: 76.4 kg 74.8 kg 73.8 kg    Intake/Output:   Intake/Output Summary (Last 24 hours) at 08/31/2020 1034 Last data filed at 08/31/2020 0829 Gross per 24 hour  Intake 780 ml  Output 1885 ml  Net -1105 ml      Physical Exam  CVP 7  General: NAD Neck: No JVD, no thyromegaly or thyroid nodule.  Lungs: Clear to auscultation bilaterally with normal respiratory effort. CV: Nondisplaced PMI.  Heart regular S1/S2, no S3/S4, no murmur.  No peripheral edema.   Abdomen: Soft, nontender, no hepatosplenomegaly, no distention.  Skin: Intact without lesions or rashes.  Neurologic: Alert and oriented x 3.  Psych:  Normal affect. Extremities: No clubbing or cyanosis.  HEENT: Normal.   Telemetry   NSR 70s (personally reviewed)  Labs    CBC Recent Labs    08/29/20 0525  WBC 6.3  HGB 11.4*  HCT 35.0*  MCV 86.6  PLT 852   Basic Metabolic Panel Recent Labs    08/30/20 0525 08/31/20 0500  NA 135 137  K 3.3* 3.3*  CL 103 102  CO2 26 26  GLUCOSE 95 68*  BUN 17 20  CREATININE 1.88* 1.85*  CALCIUM 8.3* 8.3*  MG  --  1.5*   Liver Function Tests No results for input(s): AST, ALT, ALKPHOS, BILITOT, PROT, ALBUMIN in the last 72 hours.  No results for input(s): LIPASE, AMYLASE in the last 72 hours. Cardiac Enzymes No results for input(s): CKTOTAL, CKMB, CKMBINDEX, TROPONINI in the last 72 hours.  BNP: BNP (last 3 results) Recent Labs    07/22/20 0109 07/23/20 0436 08/20/20 1002  BNP 2,071.8* 1,879.9* 338.1*    ProBNP (last 3 results) No results for input(s): PROBNP in the last 8760 hours.   D-Dimer No results for input(s): DDIMER in the last 72 hours. Hemoglobin A1C No results for input(s): HGBA1C in the last 72 hours.  Fasting Lipid Panel No results for input(s): CHOL, HDL, LDLCALC, TRIG, CHOLHDL, LDLDIRECT in the last 72 hours. Thyroid Function Tests No results for input(s): TSH, T4TOTAL, T3FREE, THYROIDAB in the last  72 hours.  Invalid input(s): FREET3  Other results:   Imaging    No results found.   Medications:     Scheduled Medications:  amiodarone  200 mg Oral BID   apixaban  5 mg Oral BID   atorvastatin  80 mg Oral Daily   Chlorhexidine Gluconate Cloth  6 each Topical Daily   clopidogrel  75 mg Oral Daily   colchicine  0.6 mg Oral Daily   dapagliflozin propanediol  10 mg Oral Daily   insulin aspart  0-9 Units Subcutaneous TID WC   midodrine  2.5 mg Oral TID WC   pantoprazole  40 mg Oral Q1200   sodium chloride flush  3 mL Intravenous Q12H   sodium chloride flush  3 mL Intravenous Q12H   torsemide  10 mg Oral Daily    Infusions:  sodium  chloride 5 mL/hr at 08/27/20 1600   magnesium sulfate bolus IVPB 4 g (08/31/20 0846)    PRN Medications: sodium chloride, acetaminophen, diclofenac Sodium, loperamide, ondansetron (ZOFRAN) IV, sodium chloride flush    Assessment/Plan   1. CAD: H/o DES to LCX in 3/19, had occluded PLV at that time. Admitted with anterior STEMI 5/22 and occluded mLAD, had DES to mLAD, known CTO PLV with collaterals and patent LCx stent.  No chest pain now.  - Continue Plavix, no ASA with apixaban use.  - Continue atorvastatin 80. 2. AKI on CKD IIIb: Creatinine 3.93>4.34>3.70>2.58>2.28>1.76>2.14>1.88>1.88>1.73>1.88>1.85.  Suspect combination of low output and low volume status, improved with milrinone and gentle rehydration.  CVP now 7, back on low dose torsemide.   3. 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  TEE 05/22 showing EF 20-25%, moderately decreased RV function with mild RVE, moderate biatrial enlargement, PASP 34, mild MR, mild TR.  Cardiogenic shock during 5/22 admission, was eventually able to titrate off IABP and milrinone.  He has struggled since discharge, admitted this time with with AKI and prominent fatigue, cool extremities, low output HF. Echo this admit with EF 25%, RV okay, mild MR, mild AI. TEE with EF 20%, mild RV dysfunction.  Milrinone discontinued on 7/28, co-ox 57% today.  CVP 7.  Creatinine stable.  - Continue torsemide 10 mg daily.   - Can continue Farxiga 10 mg daily.  - Stay off spironolactone, hydralazine/imdur for now.  - Continue midodrine 2.5 mg TID for now, BP improved.  - Off digoxin with recent AKI and high level/toxicity. - Concerning clinical picture with recurrent low output HF.  With improvement in creatinine on milrinone, we considered LVAD.  Social situation is an issue. He has no insurance coverage for home milrinone or LVAD and has no way of getting it in the  near term.  He does not have a caregiver for post-LVAD care either.  Therefore, we do not have good options here.   4. COPD: Prior smoker 5. Atrial flutter: Atypical, had DCCV in 5/22 but now back in atrial flutter.  TEE-guided DCCV on 7/25.  Remains in NSR since DCCV.  - Continue amiodarone 200 mg bid, decrease to daily at discharge.  - Continue apixaban.   6. Left elbow pain: Patient has history of gout, which typically occurs in his right foot.  Elbow swollen, warm to touch and extremely tender to touch.  Plain film with effusion.  Uric acid normal, ?pseudogout.  Seen by ortho, recommend treat for gout.  - Improving with colchicine, continue  once daily.  Upsets his stomach so will need to stop when symptoms resolve.   Unfortunately, though co-ox is marginal, there is no way for [place] to get him on home milrinone and despite the best efforts of social work, looks like there is no way to get coverage for LVAD.  He is ready for home today but lives in [place] and has no ride.  His ex-wife can pick him up tomorrow morning, so will plan on discharge tomorrow.   Loralie Champagne 08/31/2020 10:34 AM

## 2020-09-01 ENCOUNTER — Telehealth (HOSPITAL_COMMUNITY): Payer: Self-pay

## 2020-09-01 ENCOUNTER — Other Ambulatory Visit (HOSPITAL_COMMUNITY): Payer: Self-pay

## 2020-09-01 DIAGNOSIS — Z66 Do not resuscitate: Secondary | ICD-10-CM

## 2020-09-01 DIAGNOSIS — I5023 Acute on chronic systolic (congestive) heart failure: Secondary | ICD-10-CM

## 2020-09-01 LAB — BASIC METABOLIC PANEL
Anion gap: 9 (ref 5–15)
Anion gap: 8 (ref 5–15)
BUN: 23 mg/dL (ref 8–23)
BUN: 21 mg/dL (ref 8–23)
CO2: 26 mmol/L (ref 22–32)
CO2: 27 mmol/L (ref 22–32)
Calcium: 8.2 mg/dL — ABNORMAL LOW (ref 8.9–10.3)
Calcium: 8.3 mg/dL — ABNORMAL LOW (ref 8.9–10.3)
Chloride: 102 mmol/L (ref 98–111)
Chloride: 102 mmol/L (ref 98–111)
Creatinine, Ser: 1.99 mg/dL — ABNORMAL HIGH (ref 0.61–1.24)
Creatinine, Ser: 1.98 mg/dL — ABNORMAL HIGH (ref 0.61–1.24)
GFR, Estimated: 35 mL/min — ABNORMAL LOW (ref >60)
GFR, Estimated: 35 mL/min — ABNORMAL LOW (ref >60)
Glucose, Bld: 110 mg/dL — ABNORMAL HIGH (ref 70–99)
Glucose, Bld: 109 mg/dL — ABNORMAL HIGH (ref 70–99)
Potassium: 3.2 mmol/L — ABNORMAL LOW (ref 3.5–5.1)
Potassium: 3.9 mmol/L (ref 3.5–5.1)
Sodium: 137 mmol/L (ref 135–145)
Sodium: 137 mmol/L (ref 135–145)

## 2020-09-01 LAB — COOXEMETRY PANEL
Carboxyhemoglobin: 0.8 % (ref 0.5–1.5)
Methemoglobin: 0.9 % (ref 0.0–1.5)
O2 Saturation: 62.3 %
Total hemoglobin: 12 g/dL (ref 12.0–16.0)

## 2020-09-01 LAB — GLUCOSE, CAPILLARY
Glucose-Capillary: 111 mg/dL — ABNORMAL HIGH (ref 70–99)
Glucose-Capillary: 141 mg/dL — ABNORMAL HIGH (ref 70–99)

## 2020-09-01 LAB — MAGNESIUM: Magnesium: 2.2 mg/dL (ref 1.7–2.4)

## 2020-09-01 MED ORDER — MIDODRINE HCL 5 MG PO TABS
5.0000 mg | ORAL_TABLET | Freq: Three times a day (TID) | ORAL | 1 refills | Status: AC
  Filled 2020-09-01: qty 90, 30d supply, fill #0

## 2020-09-01 MED ORDER — CLOPIDOGREL BISULFATE 75 MG PO TABS
75.0000 mg | ORAL_TABLET | Freq: Every day | ORAL | 1 refills | Status: AC
  Filled 2020-09-01: qty 30, 30d supply, fill #0

## 2020-09-01 MED ORDER — DAPAGLIFLOZIN PROPANEDIOL 10 MG PO TABS
10.0000 mg | ORAL_TABLET | Freq: Every day | ORAL | 1 refills | Status: AC
  Filled 2020-09-01: qty 30, 30d supply, fill #0

## 2020-09-01 MED ORDER — AMIODARONE HCL 200 MG PO TABS
200.0000 mg | ORAL_TABLET | Freq: Every day | ORAL | 1 refills | Status: AC
  Filled 2020-09-01: qty 30, 30d supply, fill #0

## 2020-09-01 MED ORDER — TORSEMIDE 20 MG PO TABS: 10.0000 mg | ORAL_TABLET | ORAL | Status: DC

## 2020-09-01 MED ORDER — COLCHICINE 0.6 MG PO TABS
0.6000 mg | ORAL_TABLET | Freq: Every day | ORAL | 0 refills | Status: AC
  Filled 2020-09-01: qty 7, 7d supply, fill #0

## 2020-09-01 MED ORDER — POTASSIUM CHLORIDE CRYS ER 20 MEQ PO TBCR
20.0000 meq | EXTENDED_RELEASE_TABLET | Freq: Every day | ORAL | 1 refills | Status: AC
  Filled 2020-09-01: qty 30, 30d supply, fill #0

## 2020-09-01 MED ORDER — ATORVASTATIN CALCIUM 80 MG PO TABS
80.0000 mg | ORAL_TABLET | Freq: Every day | ORAL | 1 refills | Status: AC
  Filled 2020-09-01: qty 30, 30d supply, fill #0

## 2020-09-01 MED ORDER — CAMPHOR-MENTHOL 0.5-0.5 % EX LOTN
TOPICAL_LOTION | CUTANEOUS | Status: DC | PRN
  Filled 2020-09-01: qty 222

## 2020-09-01 MED ORDER — MIDODRINE HCL 5 MG PO TABS
5.0000 mg | ORAL_TABLET | Freq: Three times a day (TID) | ORAL | Status: DC
  Administered 2020-09-01: 5 mg via ORAL
  Filled 2020-09-01: qty 1

## 2020-09-01 MED ORDER — APIXABAN 5 MG PO TABS
5.0000 mg | ORAL_TABLET | Freq: Two times a day (BID) | ORAL | 1 refills | Status: AC
  Filled 2020-09-01: qty 60, 30d supply, fill #0

## 2020-09-01 MED ORDER — POTASSIUM CHLORIDE CRYS ER 20 MEQ PO TBCR
40.0000 meq | EXTENDED_RELEASE_TABLET | Freq: Once | ORAL | Status: AC
  Administered 2020-09-01: 40 meq via ORAL
  Filled 2020-09-01: qty 2

## 2020-09-01 MED ORDER — NITROGLYCERIN 0.4 MG SL SUBL
0.4000 mg | SUBLINGUAL_TABLET | SUBLINGUAL | 0 refills | Status: AC | PRN
  Filled 2020-09-01: qty 25, 8d supply, fill #0

## 2020-09-01 MED ORDER — TORSEMIDE 10 MG PO TABS
10.0000 mg | ORAL_TABLET | ORAL | 0 refills | Status: AC
  Filled 2020-09-01: qty 16, 32d supply, fill #0

## 2020-09-01 NOTE — Progress Notes (Signed)
HOB less than 45*. Instructed patient on procedure. Pt held breath on line removal. Pressure held, with no s/sx of bleeding from site. Pressure drsg applied and instructed patient to remain in bed for 90min. Report any bleeding noted to nurse. Instructed to keep drsg CDI for 24 hours. VU. Fran Lowes RN VAST

## 2020-09-01 NOTE — Plan of Care (Signed)

## 2020-09-01 NOTE — Care Management Important Message (Signed)
Important Message  Patient Details  Name: Douglas Villa MRN: [id number] Date of Birth: [birth year]   Medicare Important Message Given:  Yes     Shelda Altes 09/01/2020, 9:57 AM

## 2020-09-01 NOTE — Telephone Encounter (Signed)
Heart Failure Patient Advocate Encounter  [place] sent in an effort to reduce the patient's out of pocket expense for Farxiga to $0.    Application completed and faxed to [phone]   [place] patient [place] phone number for follow up is [phone].   Amani Marseille Boothby, PharmD, BCPS Heart Failure Stewardship Pharmacist Phone [phone]   

## 2020-09-01 NOTE — TOC Transition Note (Addendum)
Transition of Care [facility]) - CM/SW Discharge Note   Patient Details  Name: Douglas Villa MRN: [id number] Date of Birth: 1950/08/22  Transition of Care [facility] CM/SW Contact:  Erenest Rasher, RN Phone Number: [phone] 09/01/2020, 12:43 PM   Clinical Narrative:     TOC CM spoke to pt at bedside. [facility] will be following for Palliative Services. Spoke to Amy, [facility] HH rep. Pt will followed by [place] branch for [facility] PT. Will updated [facility] agency on address. Pt has the contact information to follow up with Medicare about Part B and D. Explained he may have a penalty. [place] and his meds will sent up from pharmacy prior to dc. Explained to pt that once [employer] has been approved with HF CSW, it will be shipped to his address. Pt states he can locate a cane or purchase.   [place], [place] [address], [facility]  Final next level of care: [place] Barriers to Discharge: No Barriers Identified   Patient Goals and CMS Choice Patient states their goals for this hospitalization and ongoing recovery are:: states he wants permanent housing CMS Medicare.gov Compare Post Acute Care list provided to:: Patient Choice offered to / list presented to : Patient  Discharge Placement                       Discharge Plan and Services In-house Referral: Clinical Social Work Discharge Planning Services: CM Consult Post Acute Care Choice: Hospice (Palliative)            DME Agency: [employer] Date DME Agency Contacted: 08/26/20 Time DME Agency Contacted: [phone] Representative spoke with at DME Agency: [place]: PT [place]: [place] Date [place]: 09/01/20 Time [place]: 1242 Representative spoke with at [place]: [place]  Social Determinants of Health ([place]) Interventions Food Insecurity Interventions: Assist with [employer] Strain Interventions:  Development worker, community Housing Interventions: Other (Comment) (Referral to HF outpatient to see about options) Transportation Interventions: Intervention Not Indicated   Readmission Risk Interventions No flowsheet data found.

## 2020-09-01 NOTE — Discharge Summary (Addendum)
Advanced Heart Failure Team  Discharge Summary   Patient ID: ZAMIR STAPLES MRN: [id number], DOB/AGE: 1950/12/26 70 y.o. Admit date: 08/20/2020 D/C date:     09/01/2020   Primary Discharge Diagnoses:  Acute on chronic systolic HF Cardiogenic shock Coronary artery disease AKI on CKD IIIb COPD Atrial flutter with RVR Acute gout flare Hypokalemia   Hospital Course:   Patient is a 70 year old male with history of CAD s/p DES to pLCx, chronic systolic HF, cardiomyopathy, HTN, prior tobacco use with COPD, CKD stage III, paroxysmal atrial fibrillation.      He was sent to the ED from PCP office 5/22 for volume overload. Had not been seen in the clinic for about 2 years. He was transferred to [facility] due to anterior MI and cardiogenic shock. LHC 06/20/2020 showed distally occluded LAD s/p PCI. IABP was placed and required milrinone. Echo with LVEF 15% RV severely decreased with severe TR and moderate to severe MR. No VSD. He was noted to be in new onset atrial fibrillation s/p TEE/DCCV 06/27/20. He declined LifeVest.   Admitted from AHF clinic 07/14/20 for dehydration. Also found to have bed bug infestation. Hospitalization c/b digoxin toxicity, AKI and hyperkalemia. Diuretics/ BP active meds + digoxin held. Noted to have atrial flutter that admission.  He was seen in the clinic for f/u on 07/20 and complained of nausea, vomiting and dizziness. He was admitted for concern of low output a/c systolic HF, AKI on CKD. He was initiated on milrinone. EF 25% on echo. RV okay. Creatinine trended up to 4.3. Initially HD catheter placed with initial concern for needing dialysis. Creatinine improved with milrinone and hydration. GDMT and diuretics initially held. He was in atrial flutter and underwent TEE/DCCV on 07/25. Remained in sinus rhythm throughout rest of hospitalization. Had considered LVAD but combination of social situation and insurance were prohibitive. He was seen by palliative care to discuss goals of  care and decided to switch code status to DNR prior to discharge.   Hospital Course by Problem: 1. 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  TEE 05/22 showing EF 20-25%, moderately decreased RV function with mild RVE, moderate biatrial enlargement, PASP 34, mild MR, mild TR.  Cardiogenic shock during 5/22 admission, was eventually able to titrate off IABP and milrinone.  He has struggled since discharge, admitted this time with with AKI and prominent fatigue, cool extremities, low output HF. Echo this admit with EF 25%, RV okay, mild MR, mild AI. TEE with EF 20%, mild RV dysfunction.  [place] 07/22 on milrinone with RA 1, RCWP mean 3, Fick CI 2.3/Thermo CI 2.07. -Milrinone discontinued on 7/28, co-ox 62% today.  CVP 2.  Does not appear volume overloaded. Creatinine up slightly from yesterday. Notes orthostatic dizziness. -Decrease torsemide to 10 mg every other day. -Can continue Farxiga 10 mg daily. -Stay off spironolactone, hydralazine/imdur, entresto for now. -Increase midodrine to 5 mg TID d/t dizziness. BP okay this am. -Off digoxin with recent AKI and high level/toxicity. -Concerning clinical picture with recurrent low output HF.  With improvement in creatinine on milrinone, we considered LVAD.  Social situation is an issue. He has no insurance coverage for home milrinone or LVAD and has no way of getting it in the near term.  He does not have a caregiver for post-LVAD care either.  Therefore, we do not have good options here.  2. CAD:  -H/o DES to LCX in 3/19, had occluded PLV at that time. Admitted with anterior STEMI 5/22 and occluded mLAD, had DES to mLAD, known CTO PLV with collaterals and patent LCx stent.  No chest pain now. - Continue Plavix, no ASA with apixaban use.  - Continue atorvastatin 80.   3. AKI on CKD IIIb: Creatinine  -3.93>4.34>3.70>2.58>2.28>1.76>2.14>1.88>1.88>1.73>1.88>1.85>1.99  -Nephrology consulted. Initially concerned may need HD and HD catheter placed. Not needed, removed prior to D/C. -Suspect combination of low output and low volume status, improved with milrinone and gentle rehydration.  CVP now 2, decrease torsemide to every other day.   4. COPD:  -Prior smoker   5. Atrial flutter:  -Atypical, had DCCV in 5/22 but now back in atrial flutter.  TEE-guided DCCV on 7/25.  Remains in NSR since DCCV. -Continue amiodarone 200 mg bid, decreased to daily at discharge.  -Continue apixaban.     6. Left elbow pain:  -Patient has history of gout.  Elbow swollen, warm to touch and extremely tender to touch.  Plain film with effusion.  Uric acid normal, ?pseudogout.  Seen by ortho, recommend treat for gout. - Improving with colchicine, continue once daily (short supply prescribed).  Upsets his stomach so will need to stop when symptoms resolve.   7. Hypokalemia: -K 3.2 today. Replaced.  -K 3.9 this afternoon -Will go home on K supplement   Discharging home this afternoon with home health/PT.   GOC: -Seen by palliative care. Now a DNR    SDOH: -Lives in hotel. Consulted TOC CSW - assisted with SNAP and CAFA applications -Medications through [employer] - 30 day free cards for farxiga and eliquis, patient assistance applications processing   Discharge Weight Range: 153 lb on admit, up to 168 lb, 163 lb at discharge  Discharge Vitals: Blood pressure 117/78, pulse 65, temperature 98.1 F (36.7 C), temperature source Oral, resp. rate 17, height $RemoveBe'5\' 7"'kTEVuGcIg$  (1.702 m), weight 74 kg, SpO2 100 %.  Labs: Lab Results  Component Value Date   WBC 6.3 08/29/2020   HGB 11.4 (L) 08/29/2020   HCT 35.0 (L) 08/29/2020   MCV 86.6 08/29/2020   PLT 179 08/29/2020    Recent Labs  Lab 09/01/20 1257  NA 137  K 3.9  CL 102  CO2 27  BUN 21  CREATININE 1.98*  CALCIUM 8.3*  GLUCOSE 109*   Lab Results   Component Value Date   CHOL 110 08/19/2017   HDL 44 08/19/2017   LDLCALC 52 08/19/2017   TRIG 68 08/19/2017   BNP (last 3 results) Recent Labs    07/22/20 0109 07/23/20 0436 08/20/20 1002  BNP 2,071.8* 1,879.9* 338.1*    ProBNP (last 3 results) No results for input(s): PROBNP in the last 8760 hours.   Diagnostic Studies/Procedures  TEE/cardioversion, 08/25/2020  RHC, 08/22/2020: 1. Low filling pressures. 2. Marginal thermodilution cardiac output, preserved Fick cardiac output on 0.25 mcg/kg/min milrinone.  Echo, 08/21/2020: IMPRESSIONS   1. Left ventricular ejection fraction, by estimation, is 25%. The left  ventricle has severely decreased function. The left ventricle demonstrates  regional wall motion abnormalities (apical akinesis without evidence of LV  thrombus). There is mild concentric left ventricular hypertrophy. Left ventricular diastolic parameters are consistent with Grade III diastolic dysfunction (restrictive).   2. Right ventricular systolic function is normal. The right ventricular  size is normal. There is normal pulmonary artery systolic pressure. The  estimated right ventricular systolic pressure is 59.9 mmHg.   3. Left atrial size  was moderately dilated.   4. The mitral valve is abnormal. Mild mitral valve regurgitation. No  evidence of mitral stenosis.   5. The aortic valve is tricuspid. There is mild calcification of the  aortic valve. Aortic valve regurgitation is mild. Aortic valve sclerosis  is present, with no evidence of aortic valve stenosis. Aortic  regurgitation PHT measures 626 msec.   6. The inferior vena cava is normal in size with greater than 50%  respiratory variability, suggesting right atrial pressure of 3 mmHg.   Discharge Medications   Allergies as of 09/01/2020       Reactions   Penicillins Other (See Comments)   Sweating        Medication List     STOP taking these medications    Accu-Chek Aviva Plus w/Device Kit    Accu-Chek FastClix Lancets Misc   docusate sodium 100 MG capsule Commonly known as: COLACE   glucose blood test strip   hydrALAZINE 25 MG tablet Commonly known as: APRESOLINE   isosorbide mononitrate 30 MG 24 hr tablet Commonly known as: IMDUR   ondansetron 4 MG tablet Commonly known as: Zofran   spironolactone 25 MG tablet Commonly known as: ALDACTONE       TAKE these medications    albuterol 108 (90 Base) MCG/ACT inhaler Commonly known as: VENTOLIN HFA Inhale 2 puffs into the lungs every 6 (six) hours as needed for wheezing or shortness of breath.   amiodarone 200 MG tablet Commonly known as: PACERONE Take 1 tablet (200 mg total) by mouth daily. What changed: when to take this   apixaban 5 MG Tabs tablet Commonly known as: ELIQUIS Take 1 tablet (5 mg total) by mouth 2 (two) times daily.   atorvastatin 80 MG tablet Commonly known as: LIPITOR Take 1 tablet (80 mg total) by mouth daily. Needs appt for future refills   clopidogrel 75 MG tablet Commonly known as: PLAVIX Take 1 tablet (75 mg total) by mouth daily. Start taking on: September 02, 2020 What changed: See the new instructions.   colchicine 0.6 MG tablet Take 1 tablet (0.6 mg total) by mouth daily. Stop once elbow pain resolved. Start taking on: September 02, 2020   dapagliflozin propanediol 10 MG Tabs tablet Commonly known as: FARXIGA Take 1 tablet (10 mg total) by mouth daily.   midodrine 5 MG tablet Commonly known as: PROAMATINE Take 1 tablet (5 mg total) by mouth 3 (three) times daily with meals.   nitroGLYCERIN 0.4 MG SL tablet Commonly known as: NITROSTAT Place 1 tablet (0.4 mg total) under the tongue every 5 (five) minutes as needed for chest pain.   pantoprazole 40 MG tablet Commonly known as: PROTONIX Take 1 tablet (40 mg total) by mouth daily at 12 noon.   potassium chloride SA 20 MEQ tablet Commonly known as: KLOR-CON Take 1 tablet (20 mEq total) by mouth daily.   torsemide 10 MG  tablet Commonly known as: DEMADEX Take 1 tablet (10 mg total) by mouth every other day. Start taking on: September 02, 2020 What changed:  medication strength how much to take when to take this               Durable Medical Equipment  (From admission, onward)           Start     Ordered   09/01/20 0000  Heart failure home health orders  (Heart failure home health orders / Face to face)       Comments: Heart Failure  Follow-up Care:  Verify follow-up appointments per Patient Discharge Instructions. Confirm transportation arranged. Reconcile home medications with discharge medication list. Remove discontinued medications from use. Assist patient/caregiver to manage medications using pill box. Reinforce low sodium food selection Assessments: Vital signs and oxygen saturation at each visit. Assess home environment for safety concerns, caregiver support and availability of low-sodium foods. Consult Education officer, museum, PT/OT, Dietitian, and CNA based on assessments. Perform comprehensive cardiopulmonary assessment. Notify MD for any change in condition or weight gain of 3 pounds in one day or 5 pounds in one week with symptoms. Daily Weights and Symptom Monitoring: Ensure patient has access to scales. Teach patient/caregiver to weigh daily before breakfast and after voiding using same scale and record.    Teach patient/caregiver to track weight and symptoms and when to notify Provider. Activity: Develop individualized activity plan with patient/caregiver.   Question Answer Comment  Heart Failure Follow-up Care Advanced Heart Failure (AHF) Clinic at [phone]   Obtain the following labs Basic Metabolic Panel   Lab frequency Weekly   Fax lab results to AHF Clinic at [phone]   Diet Low Sodium Heart Healthy   Fluid restrictions: 1500 mL Fluid   Skilled Nurse to notify MD of weight trends weekly for first 2 weeks. May fax or call: AHF Clinic at [phone] (fax) or [phone]       09/01/20 1345   08/26/20 1654  For home use only DME 4 wheeled rolling walker with seat  Once       Question:  Patient needs a walker to treat with the following condition  Answer:  CHF (congestive heart failure) ([place])   08/26/20 1654   08/26/20 1620  For home use only DME 4 wheeled rolling walker with seat  Once       Question:  Patient needs a walker to treat with the following condition  Answer:  CHF (congestive heart failure) ([place])   08/26/20 1620            Disposition   The patient will be discharged in stable condition to home. Discharge Instructions     Face-to-face encounter (required for Medicare/Medicaid patients)   Complete by: As directed    I [facility], LINDSAY N certify that this patient is under my care and that I, or a nurse practitioner or physician's assistant working with me, had a face-to-face encounter that meets the physician face-to-face encounter requirements with this patient on 09/01/2020. The encounter with the patient was in whole, or in part for the following medical condition(s) which is the primary reason for home health care (List medical condition): Acute on chronic systolic HF, deconditioning   The encounter with the patient was in whole, or in part, for the following medical condition, which is the primary reason for home health care: Acute on chronic systolic HF, deconditioning   I certify that, based on my findings, the following services are medically necessary home health services:  Physical therapy Nursing     Reason for Medically Necessary Home Health Services: Skilled Nursing- Skilled Assessment/Observation   My clinical findings support the need for the above services:  Unsafe ambulation due to balance issues Shortness of breath with activity     Further, I certify that my clinical findings support that this patient is homebound due to:  Unable to leave home safely without assistance Shortness of Breath with activity     Heart failure home health  orders   Complete by: As directed    Heart Failure Follow-up Care:  Verify follow-up appointments per Patient Discharge Instructions. Confirm transportation arranged. Reconcile home medications with discharge medication list. Remove discontinued medications from use. Assist patient/caregiver to manage medications using pill box. Reinforce low sodium food selection Assessments: Vital signs and oxygen saturation at each visit. Assess home environment for safety concerns, caregiver support and availability of low-sodium foods. Consult Education officer, museum, PT/OT, Dietitian, and CNA based on assessments. Perform comprehensive cardiopulmonary assessment. Notify MD for any change in condition or weight gain of 3 pounds in one day or 5 pounds in one week with symptoms. Daily Weights and Symptom Monitoring: Ensure patient has access to scales. Teach patient/caregiver to weigh daily before breakfast and after voiding using same scale and record.    Teach patient/caregiver to track weight and symptoms and when to notify Provider. Activity: Develop individualized activity plan with patient/caregiver.    Heart Failure Follow-up Care: Advanced Heart Failure (AHF) Clinic at [phone]   Obtain the following labs: Basic Metabolic Panel   Lab frequency: Weekly   Fax lab results to: AHF Clinic at [phone]   Diet: Low Sodium Heart Healthy   Fluid restrictions: 1500 mL Fluid   Skilled Nurse to notify MD of weight trends weekly for first 2 weeks. May fax or call: AHF Clinic at [phone] (fax) or [phone]       Follow-up [place] Follow up on 09/08/2020.   Specialty: Cardiology Why: Advanced Heart Failure Clinic at [facility] at 3:30 pm Entrance C, Garage Code 9449 Contact information: [address] [id number] [place], [facility] Follow up.   Specialty: [place] Why: [facility] will call for appt for [place] information: [place] [address] [phone]                   Duration of Discharge Encounter: Greater than 35 minutes   Signed, [facility], LINDSAY N  09/01/2020, 2:57 PM \  Patient seen by PA, agree with the above note.    He is stable to go home today, send out on torsemide 10 mg every other day and increase midodrine to 5 mg tid.  Close followup.   Loralie Champagne 09/01/2020 3:23 PM

## 2020-09-01 NOTE — Progress Notes (Signed)
Palliative:  Consult Reason: Goals of care and LVAD evaluation/discussion   HPI: 70 y.o. male  with past medical history of CHF EF 25-30%, CAD (s/p stenting 2019), cardiomyopathy, HTN, and smoking (quit smoking in 2/19, but now back to 1 PPD), COPD, CKD stage 4 admitted on 08/20/2020 with CHF and cardiogenic shock with renal failure for treatment with initiation of milrinone. Renal function has improved. Now consideration of LVAD - he has decided to not pursue LVAD.  I met today with Douglas Villa. We discuss the information and meetings he has had regarding LVAD so far and he shares confidently that this is not something he is interested in pursuing. He talks of the care needs and changes to daily life that he feels he would struggle with and I reassure him that LVAD is supposed to add improvement to life not more burden. He feels at peace with this decision.   We further discussed goals of care and MOST form. We talked through all the decisions and his wishes. He decides to pursue DNR at this time and shares with me about witnessing a close friend go through these measures at the end of her life just earlier this year. He notes the suffering that she experienced compared to the peaceful deaths of his mother, sister, and niece not going through these measures. He understands with his advanced heart disease that he would be unlikely to survive resuscitation attempts and even if so it would not be a quality that he would want for himself.   He further decides on MOST: DNR, limited interventions (return to hospital but avoid ICU/invasive interventions), determine use/limitation of antibiotics at time of infection, IVF and feeding tube for temporary trial period (time to see if these measures will assist him to improve). Douglas Villa reports comfort with these decisions and no questions or concerns. Most important to him is not to be a burden and documenting these decisions so his family is not faced with  decisions for his care.   He is preparing to go home today. He does complain of some vertigo type symptoms that has lead to nausea. We discussed taking time for change of position which seems to be the trigger. He also complains of a small spot on posterior right shoulder of some burning and nerve type pain - question if HD cath is contributing by making him hold his head or sleep in certain positions. Will trial sarna lotion to see if this helps.   All questions/concerns addressed. Emotional support provided.   Exam: Alert, oriented. No distress. Good spirits. Breathing regular, unlabored. Abd soft, flat. Moves all extremities. Generalizes fatigue and weakness.    Plan: - DNR decided.  - MOST completed: DNR, limited interventions, determine limitations/benefits of antibiotic at time of infection, temporary trial of IVF/feeding tube.  - Home with palliative to follow.   [id number] 90 min  Vinie Sill, NP Palliative Medicine Team Pager [phone] (Please see [email or web address] for schedule) Team Phone [phone]    Greater than 50%  of this time was spent counseling and coordinating care related to the above assessment and plan

## 2020-09-01 NOTE — Progress Notes (Addendum)
Patient ID: Douglas Villa, male   DOB: [birth year], 70 y.o.   MRN: [id number]      Advanced Heart Failure Rounding Note  PCP-Cardiologist: Dr. Aundra Dubin   Subjective:    Left elbow improved, orthopedics thinks it is gout.  Colchicine has helped though it causes diarrhea.  Off milrinone, co-ox 62%.  CVP 2 this morning. Creatinine 1.88 => 1.85 =>1.99.  No dyspnea. Complaining of dizziness with positional changes.   Maintaining NSR  Cardiac Studies  - Echo 08/21/2020: EF 25%, apical akinesis, GIIDD, RV okay,   RVSP 26 mmHg, mild MR, mild AI - TEE (7/22): EF 20%, diffuse hypokinesis, mildly decreased RV systolic function.   [facility] Procedural Findings on milrinone 0.25: Hemodynamics (mmHg) RA mean 1 RV 25/1 PA 28/9, mean 16 PCWP mean 3 Oxygen saturations: PA 65% AO 99% Cardiac Output (Fick) 4.14  Cardiac Index (Fick) 2.3 Cardiac Output (Thermo) 3.72  Cardiac Index (Thermo) 2.07  Objective:   Weight Range: 74 kg Body mass index is 25.55 kg/m.   Vital Signs:   Temp:  [97.7 F (36.5 C)-98.2 F (36.8 C)] 97.7 F (36.5 C) (08/01 0720) Pulse Rate:  [61-68] 63 (08/01 0720) Resp:  [17-21] 20 (08/01 0720) BP: (115-128)/(71-83) 122/82 (08/01 0720) SpO2:  [90 %-100 %] 100 % (08/01 0720) Weight:  [74 kg] 74 kg (08/01 0454) Last BM Date: 08/30/20  Weight change: Filed Weights   08/30/20 0524 08/31/20 0600 09/01/20 0454  Weight: 74.8 kg 73.8 kg 74 kg    Intake/Output:   Intake/Output Summary (Last 24 hours) at 09/01/2020 0823 Last data filed at 08/31/2020 2359 Gross per 24 hour  Intake 1080 ml  Output 1600 ml  Net -520 ml      Physical Exam  CVP 2  General: NAD Neck: No JVD, no thyromegaly or thyroid nodule.  Lungs: Clear to auscultation bilaterally with normal respiratory effort. CV: Nondisplaced PMI.  Rhythm is regular. S1/S2, no S3/S4, no murmur.  Abdomen: Soft, nontender, no hepatosplenomegaly, no distention.  Skin: Intact without lesions or rashes.  Neurologic:  Alert and oriented x 3.  Psych: Normal affect. Extremities: No clubbing or cyanosis. No edema. HEENT: Normal.   Telemetry   NSR 70s (personally reviewed)  Labs    CBC No results for input(s): WBC, NEUTROABS, HGB, HCT, MCV, PLT in the last 72 hours.  Basic Metabolic Panel Recent Labs    08/31/20 0500 09/01/20 0548  NA 137 137  K 3.3* 3.2*  CL 102 102  CO2 26 26  GLUCOSE 68* 110*  BUN 20 23  CREATININE 1.85* 1.99*  CALCIUM 8.3* 8.2*  MG 1.5* 2.2   Liver Function Tests No results for input(s): AST, ALT, ALKPHOS, BILITOT, PROT, ALBUMIN in the last 72 hours.  No results for input(s): LIPASE, AMYLASE in the last 72 hours. Cardiac Enzymes No results for input(s): CKTOTAL, CKMB, CKMBINDEX, TROPONINI in the last 72 hours.  BNP: BNP (last 3 results) Recent Labs    07/22/20 0109 07/23/20 0436 08/20/20 1002  BNP 2,071.8* 1,879.9* 338.1*    ProBNP (last 3 results) No results for input(s): PROBNP in the last 8760 hours.   D-Dimer No results for input(s): DDIMER in the last 72 hours. Hemoglobin A1C No results for input(s): HGBA1C in the last 72 hours.  Fasting Lipid Panel No results for input(s): CHOL, HDL, LDLCALC, TRIG, CHOLHDL, LDLDIRECT in the last 72 hours. Thyroid Function Tests No results for input(s): TSH, T4TOTAL, T3FREE, THYROIDAB in the last 72 hours.  Invalid input(s): FREET3  Other results:   Imaging    No results found.   Medications:     Scheduled Medications:  amiodarone  200 mg Oral BID   apixaban  5 mg Oral BID   atorvastatin  80 mg Oral Daily   Chlorhexidine Gluconate Cloth  6 each Topical Daily   clopidogrel  75 mg Oral Daily   colchicine  0.6 mg Oral Daily   dapagliflozin propanediol  10 mg Oral Daily   insulin aspart  0-9 Units Subcutaneous TID WC   midodrine  2.5 mg Oral TID WC   pantoprazole  40 mg Oral Q1200   potassium chloride  20 mEq Oral Daily   sodium chloride flush  3 mL Intravenous Q12H   sodium chloride flush  3  mL Intravenous Q12H   torsemide  10 mg Oral Daily    Infusions:  sodium chloride 5 mL/hr at 08/27/20 1600    PRN Medications: sodium chloride, acetaminophen, diclofenac Sodium, loperamide, ondansetron (ZOFRAN) IV, sodium chloride flush    Assessment/Plan  1. 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  TEE 05/22 showing EF 20-25%, moderately decreased RV function with mild RVE, moderate biatrial enlargement, PASP 34, mild MR, mild TR.  Cardiogenic shock during 5/22 admission, was eventually able to titrate off IABP and milrinone.  He has struggled since discharge, admitted this time with with AKI and prominent fatigue, cool extremities, low output HF. Echo this admit with EF 25%, RV okay, mild MR, mild AI. TEE with EF 20%, mild RV dysfunction.   -Milrinone discontinued on 7/28, co-ox 62% today.  CVP 2.  Does not appear volume overloaded. Creatinine up slightly from yesterday. -Decrease torsemide to 10 mg every other day. -Can continue Farxiga 10 mg daily.  -Stay off spironolactone, hydralazine/imdur for now.  -Increase midodrine to 5 mg TID d/t dizziness. BP okay this am. -Off digoxin with recent AKI and high level/toxicity. -Concerning clinical picture with recurrent low output HF.  With improvement in creatinine on milrinone, we considered LVAD.  Social situation is an issue. He has no insurance coverage for home milrinone or LVAD and has no way of getting it in the near term.  He does not have a caregiver for post-LVAD care either.  Therefore, we do not have good options here.    2. CAD:  -H/o DES to LCX in 3/19, had occluded PLV at that time. Admitted with anterior STEMI 5/22 and occluded mLAD, had DES to mLAD, known CTO PLV with collaterals and patent LCx stent.  No chest pain now.  - Continue Plavix, no ASA with apixaban use.  - Continue atorvastatin 80.  3. AKI on CKD IIIb:  Creatinine -3.93>4.34>3.70>2.58>2.28>1.76>2.14>1.88>1.88>1.73>1.88>1.85>1.99 -Suspect combination of low output and low volume status, improved with milrinone and gentle rehydration.  CVP now 2, decrease torsemide to every other day.  4. COPD:  -Prior smoker  5. Atrial flutter:  -Atypical, had DCCV in 5/22 but now back in atrial flutter.  TEE-guided DCCV on 7/25.  Remains in NSR since DCCV.  -Continue amiodarone 200 mg bid, decrease to daily at discharge.  -Continue apixaban.    6. Left elbow pain:  -Patient has history of gout, which typically occurs in his right foot.  Elbow swollen, warm to touch and extremely tender to touch.  Plain film with effusion.  Uric acid normal, ?pseudogout.  Seen by ortho, recommend treat for gout.  - Improving with  colchicine, continue once daily.  Upsets his stomach so will need to stop when symptoms resolve.   7. Hypokalemia: -K 3.2 today. Replacing. -Recheck K this afternoon prior to discharge   SDOH: -Consulted TOC CSW - assisted with SNAP and CAFA applications  Disposition: Anticipate discharge today Will arrange f/u in HF clinic  [facility], [place] 09/01/2020 8:23 AM  Patient seen with PA, agree with the above note.   Stable BP and mildly higher creatinine, co-ox 62%.  Mild LH with standing. CVP low at 2.   General: NAD Neck: No JVD, no thyromegaly or thyroid nodule.  Lungs: Clear to auscultation bilaterally with normal respiratory effort. CV: Nondisplaced PMI.  Heart regular S1/S2, no S3/S4, no murmur.  No peripheral edema.   Abdomen: Soft, nontender, no hepatosplenomegaly, no distention.  Skin: Intact without lesions or rashes.  Neurologic: Alert and oriented x 3.  Psych: Normal affect. Extremities: No clubbing or cyanosis.  HEENT: Normal.   Think he will need some diuretic but not much.  Decrease torsemide to 10 mg every other day (hold today).  With orthostatic symptoms, can increase midodrine back to 5 mg tid.    Home today.  Needs  close followup in CHF clinic.  Meds for home: midodrine 5 mg tid, torsemide 10 mg qod, Lasix 20 mEq qod, colchicine 0.6 mg daily (can stop when gout pain resolved), atorvastastin 80, apixaban 5 bid, Plavix 75 daily, amiodarone 200 daily, dapagliflozin 10 daily.   Douglas Villa 09/01/2020 9:26 AM

## 2020-09-02 ENCOUNTER — Telehealth: Payer: Self-pay | Admitting: Student

## 2020-09-02 NOTE — Telephone Encounter (Signed)
Attempted to reach patient on his cell to offer to schedule a Palliative Consult, no answer - left message with reason for call along with my name and call back number.

## 2020-09-03 ENCOUNTER — Other Ambulatory Visit (HOSPITAL_COMMUNITY): Payer: Self-pay

## 2020-09-04 ENCOUNTER — Telehealth (HOSPITAL_COMMUNITY): Payer: Self-pay | Admitting: Pharmacist

## 2020-09-04 ENCOUNTER — Telehealth (HOSPITAL_COMMUNITY): Payer: Self-pay | Admitting: Pharmacy Technician

## 2020-09-04 ENCOUNTER — Other Ambulatory Visit (HOSPITAL_COMMUNITY): Payer: Self-pay

## 2020-09-04 NOTE — Telephone Encounter (Signed)
Advanced Heart Failure Patient Advocate Encounter  Received a message that patient was concerned about Eliquis co-pay. He has a BMS application in progress, he needs to provide POI for the company to finish reviewing the application. Called and left the patient a message. Hopefully he can bring this to his 8/8 appointment.   Farxiga assistance application is pending with [employer]. Application was sent 8/1. Not time to follow up yet, but will keep an eye out for communication.

## 2020-09-04 NOTE — Telephone Encounter (Signed)
Pharmacy Transitions of Care Follow-up Telephone Call  Date of discharge: 09/01/20  Discharge Diagnosis: HF/cardiogenic shock  Medication changes made at discharge:  - START: colchicine, midodrine  - STOPPED:  Accu-Chek Aviva Plus w/Device Kit  Accu-Chek FastClix Lancets Misc  docusate sodium 100 MG capsule (COLACE)  glucose blood test strip  hydrALAZINE 25 MG tablet (APRESOLINE)  isosorbide mononitrate 30 MG 24 hr tablet (IMDUR)  ondansetron 4 MG tablet (Zofran)  spironolactone 25 MG tablet (ALDACTONE)    - CHANGED: amiodarone (PACERONE)  clopidogrel (PLAVIX)  torsemide (DEMADEX)   Medication changes verified by the patient? yes    Medication Accessibility:  [facility]acy: [facility]   Was the patient provided with refills on discharged medications? Yes   Have all prescriptions been transferred from [facility] to home pharmacy?  yes  Is the patient able to afford medications? No insurance Notable copays: Wilder Glade, Eliquis Eligible patient assistance: MAP, ask office to assist    Medication Review:   APIXABAN (ELIQUIS)  Apixaban 5 mg BID, started prior to admission, this was a previous home med, continued after discharge.  - Discussed importance of taking medication around the same time everyday  - Reviewed potential DDIs with patient  - Advised patient of medications to avoid (NSAIDs, ASA)  - Educated that Tylenol (acetaminophen) will be the preferred analgesic to prevent risk of bleeding  - Emphasized importance of monitoring for signs and symptoms of bleeding (abnormal bruising, prolonged bleeding, nose bleeds, bleeding from gums, discolored urine, black tarry stools)  - Advised patient to alert all providers of anticoagulation therapy prior to starting a new medication or having a procedure   CLOPIDOGREL (PLAVIX) Clopidogrel 75 mg once daily for CAD, h/o DES - Pt not on ASA at this time due to eliquis use - Reviewed potential DDIs with patient  - Advised  patient of medications to avoid (NSAIDs, ASA)  - Educated that Tylenol (acetaminophen) will be the preferred analgesic to prevent risk of bleeding  - Emphasized importance of monitoring for signs and symptoms of bleeding (abnormal bruising, prolonged bleeding, nose bleeds, bleeding from gums, discolored urine, black tarry stools)  - Advised patient to alert all providers of anticoagulation therapy prior to starting a new medication or having a procedure   Follow-up Appointments:  PCP Hospital f/u appt confirmed?  None scheduled  Specialist Hospital f/u appt confirmed?  Scheduled to see Cardiology, [facility] on 09/08/20 @ 3:30pm.   If their condition worsens, is the pt aware to call PCP or go to the Emergency Dept.? yes  Final Patient Assessment:  Pt was doing well but was somewhat unfamiliar with his medication list.  I reviewed where to find his most up-to-date med list and confirmed that he made the appropriate changes.  He was concerned about how to pay for eliquis and farxiga so I will reach out to cardiology office to ask them to assist with MAP or prescribe an alternative.  It was a pleasure speaking with this patient.

## 2020-09-05 ENCOUNTER — Telehealth: Payer: Self-pay | Admitting: Student

## 2020-09-06 NOTE — Progress Notes (Signed)
ADVANCED HEART FAILURE CLINIC NOTE     ID:  Douglas, Villa 1950/04/12, MRN [id number]  Location: Home Provider location: [place] Advanced Heart Failure Type of Visit: Established patient   PCP:  Lillard Anes, [place]   Cardiologist:  Dr. Aundra Dubin   History of Present Illness:   Douglas Villa is a 70 y.o. with a history of CAD, cardiomyopathy, HTN, and smoking (quit smoking in 2/19) with COPD.  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  He had had one prior episode of CHF in 2016 where he was admitted at [facility] (no records available).  At the hospital in [place], he had LHC showed occluded PLV and 80% stenosis proximal LCx.  EF was 25-35% by LV-gram.  He had DES placed to pLCx.     Echo was done in 6/19.  This showed EF 25-30% with moderate-severe MR, moderate-severe TR. He saw Dr. Caryl Comes for ICD consideration, given frequent PVCs a holter was placed in 9/19 => 2% PVCs.  CPX was done in 9/19 showing moderate functional impairment from heart failure. Repeat echo in 1/20 showed EF 20-25% with severe LVH, mild MR, normal RV.   He had been followed by Dr Aundra Dubin and had a virtual visit back to 2020. He has not been seen since that time because he was concerned about COVID. He has been of meds for some time.   He was sent to the ED from PCP office 5/22 for volume overload. Echo showed an EF at 30%. Started on IV Lasix. EKG showed anterior lead STE and he was transferred to [facility] for emergent LHC and shock team evaluation.   LHC 06/20/2020 showed distally occluded LAD with PCI placement. IABP was placed due to cardiogenic shock. Swan numbers suggestive of severe biventricular failure/shock Echo with LVEF 15% RV severely decreased with severe TR and moderate to severe MR. No VSD. Placed on milrinone, NE, IV Lasix. He was noted to be in new onset atrial fibrillation. TEE/DCCV performed 06/27/20 to NSR with no  complication.   Given anterior MI and LVEF <35%, Dr. Aundra Dubin recommended pt be discharged home w/ LifeVest for primary prevention, however pt declined, citing he wore a LifeVest several years ago when he lived in [place] and could not tolerate wearing it. He was informed of indication and risk of being discharged w/o protection. He understood the risk and still declined LifeVest. He was discharged on GDMT, weight 171 lbs.   Admitted from AHF clinic 07/14/20 for dehydration. Also found to have bed bug infestation. Hospitalization c/b digoxin toxicity and AKI. Diuretics/ BP active meds + digoxin held. Insulin, Lokelma + calcium gluconate given for hyperkalemia, K 6.0>>5.3>>4.3. Hydrated w/ IVFs. SCr improving, down 5.5>>5.0>>4.5. Tele shows AFL w/ SVR 40s-50s. BP remains soft but overall improved, 50K systolic. AHF team consulted to assist w/ medication management/ elevated dig level. He was discharged to SNF, weight 176 lbs.   He was admitted from AHF clinic 08/20/20 for concern of low output a/c systolic HF, AKI on CKD. He was initiated on milrinone. Echo with EF 25%, RV ok. Creatinine up to 4.3 and HD catheter placed with concern for needing dialysis. Creatinine improved with milrinone and hydration. GDMT and diuretics initially held. He underwent TEE/DCCV on 08/25/20 for atrial flutter, remaining in SR throughout rest of hospitalization. LVAD considered but combination of social situation and lack of insurance were prohibitive. He was seen by  palliative care to discuss goals of care and decided to switch code status to DNR prior to discharge.   Today he returns for post hospitalization HF follow up. Feeling better. Had one episode of dizziness, no falls. Able to get around the motel he is living at ok. Denies increasing SOB, CP, dizziness, edema, or PND/Orthopnea. Appetite ok. No fever or chills. He does not weigh. Taking all medications.   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 Labs (9/19): K 4.5, creatinine 2.5 Labs (10/19): K 3.7, creatinine 1.92 Labs (1/20): K 3.7, creatinine 2.35 Labs (5/22): K 4.0, creatinine 1.94 Labs (8/22): K 3.9, creatinine 1.98   PMH: 1. HTN 2. CKD stage 3 3. COPD: Still smoking.   4. OSA 5. Hyperlipidemia 6. 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   - LHC (3/19): Done in [place].  Totally occluded PLV, 80% pLCx stenosis => DES to LCx.  LV-gram with EF 25-35%. - Echo (6/19): EF 25-30%, grade III diastolic dysfunction, moderate-severe MR, moderate-severe TR, PASP 57 mmHg. - CPX (9/19): with peak VO2 17.8, VE/VCO2 slope 38, RER 1.11 => moderate functional impairment. - Echo (1/20): EF 20-25%, severe LVH, mild MR, normal RV size and systolic function, PASP 33 mmHg. - Echo (5/22): EF <20%, severe biV failure - Echo (7/22): EF 25%, RV ok - RHC (7/22): Marland Kitchen Low filling pressures, marginal thermodilution cardiac output, preserved Fick cardiac output on 0.25 mcg/kg/min milrinone. 7. CAD: LHC (3/19): Done in [place].  Totally occluded PLV, 80% pLCx stenosis => DES to LCx. - LHC (5/22): Occluded distal LAD-->PCI.   8. PVCs: Holter 9/19 with 2% PVCs. 9. Gout 10. Atrial flutter: Atypical. - DCCV in 5/22  - s/p TEE-guided DCCV on 08/25/20.  ROS: All systems negative except as listed in HPI, PMH and Problem List.  SH:  Social History   Socioeconomic History   Marital status: Married    Spouse name: Not on file   Number of children: Not on file   Years of education: Not on file   Highest education level: Not on file  Occupational History   Not on file  Tobacco Use   Smoking status: Former    Packs/day: 1.00    Years: 45.00    Pack years: 45.00    Types: Cigarettes    Quit date: 2020    Years since quitting: 2.6   Smokeless tobacco: Never  Vaping Use    Vaping Use: Never used  Substance and Sexual Activity   Alcohol use: No   Drug use: No   Sexual activity: Not Currently  Other Topics Concern   Not on file  Social History Narrative   Not on file   Social Determinants of Health   Financial Resource Strain: High Risk   Difficulty of Paying Living Expenses: Hard  Food Insecurity: Food Insecurity Present   Worried About Running Out of Food in the Last Year: Sometimes true   Ran Out of Food in the Last Year: Sometimes true  Transportation Needs: No Transportation Needs   Lack of Transportation (Medical): No   Lack of Transportation (Non-Medical): No  Physical Activity: Not on file  Stress: Not on file  Social Connections: Not on file  Intimate Partner Violence: Not on file   FH:  Family History  Problem Relation Age of Onset  Cancer Mother 58   Stroke Father 81   Past Medical History:  Diagnosis Date   Abnormal EKG 03/2009   CAD (coronary artery disease)    CKD (chronic kidney disease)    Heart failure with preserved left ventricular function (HFpEF) (HCC)    Hypertension    OSA (obstructive sleep apnea)    Sexual dysfunction    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 1 Inhaler 2   amiodarone (PACERONE) 200 MG tablet Take 1 tablet (200 mg total) by mouth daily. 30 tablet 1   apixaban (ELIQUIS) 5 MG TABS tablet Take 1 tablet (5 mg total) by mouth 2 (two) times daily. 60 tablet 1   atorvastatin (LIPITOR) 80 MG tablet Take 1 tablet (80 mg total) by mouth daily. Needs appt for future refills 30 tablet 1   clopidogrel (PLAVIX) 75 MG tablet Take 1 tablet (75 mg total) by mouth daily. 30 tablet 1   colchicine 0.6 MG tablet Take 1 tablet (0.6 mg total) by mouth daily. Stop  once elbow pain resolved. 7 tablet 0   dapagliflozin propanediol (FARXIGA) 10 MG TABS tablet Take 1 tablet (10 mg total) by mouth daily. 30 tablet 1   midodrine (PROAMATINE) 5 MG tablet Take 1 tablet (5 mg total) by mouth 3 (three) times daily with meals. 90 tablet 1   nitroGLYCERIN (NITROSTAT) 0.4 MG SL tablet Place 1 tablet (0.4 mg total) under the tongue every 5 (five) minutes as needed for chest pain. 25 tablet 0   pantoprazole (PROTONIX) 40 MG tablet Take 1 tablet (40 mg total) by mouth daily at 12 noon.     potassium chloride SA (KLOR-CON) 20 MEQ tablet Take 1 tablet (20 mEq total) by mouth daily. 30 tablet 1   torsemide (DEMADEX) 10 MG tablet Take 1 tablet (10 mg total) by mouth every other day. 16 tablet 0   No current facility-administered medications for this encounter.   BP 104/76   Pulse 72   Wt 74.4 kg (164 lb)   SpO2 98%   BMI 25.69 kg/m   Wt Readings from Last 3 Encounters:  09/08/20 74.4 kg (164 lb)  09/01/20 74 kg (163 lb 1.6 oz)  08/20/20 69.7 kg (153 lb 9.6 oz)   PHYSICAL EXAM: General:  NAD. No resp difficulty, thin HEENT: Normal Neck: Supple. No JVD. Carotids 2+ bilat; no bruits. No lymphadenopathy or thryomegaly appreciated. Cor: PMI nondisplaced. Regular rate & rhythm. No rubs, gallops or murmurs. Lungs: Clear Abdomen: Soft, nontender, nondistended. No hepatosplenomegaly. No bruits or masses. Good bowel sounds. Extremities: No cyanosis, clubbing, rash, edema Neuro: Alert & oriented x 3, cranial nerves grossly intact. Moves all 4 extremities w/o difficulty. Affect pleasant.  ASSESSMENT & PLAN:  1. Chronic systolic CHF: History of mixed ischemic/nonischemic CMP (?HTN playing a role) with EF 20-25% with normal RV on 2020 echo.  TEE 05/22 showing EF 20-25%, moderately decreased RV function with mild RVE, moderate biatrial enlargement, PASP 34, mild MR, mild TR.  Cardiogenic shock during 5/22 admission, was eventually able to titrate off IABP and milrinone.  He has  struggled since discharge, admitted 7/22 w/ AKI and prominent fatigue, cool extremities, low output HF requiring milrinone. Echo this admit 7/22 with EF 25%, RV ok, mild MR, mild  AI. TEE with EF 20%, mild RV dysfunction.   - NYHA II-early III, volume ok today. - Continue torsemide 10 mg every other day. BMET today. - Continue Farxiga 10 mg daily. - Continue midodrine 5 mg tid d/t dizziness. Will hold off on decreasing today with marginal BP. - No spironolactone, hydralazine/imdur for now. - Off digoxin with recent AKI and high level/toxicity. - Concerning clinical picture with recurrent low output HF.  With improvement in creatinine on milrinone, we considered LVAD.  Social situation is an issue. He has no insurance coverage for home milrinone or LVAD and has no way of getting it in the near term.  He does not have a caregiver for post-LVAD care either.  Therefore, we do not have good options here.   2. CAD: H/o DES to LCX in 3/19, had occluded PLV at that time. Admitted with anterior STEMI 5/22 and occluded mLAD, had DES to mLAD, known CTO PLV with collaterals and patent LCx stent.  No chest pain now. - Continue Plavix, no ASA with apixaban use.  - Continue atorvastatin 80 mg daily. 3. CKD IIIb: Baseline creatinine 1.8. - Continue torsemide every other day. 4. COPD: Prior smoker. 5. Atrial flutter: Atypical, had DCCV in 5/22 but back in atrial flutter.  TEE-guided DCCV on 08/25/20.  Remains in NSR on ECG today. - Continue amiodarone 200 mg daily. - Continue apixaban.   6. Gout: Improved with colchicine.   Follow  up in 3-4 weeks with APP & 2-3 months with Dr. Newman Nip, FNP-BC 09/08/20

## 2020-09-08 ENCOUNTER — Other Ambulatory Visit: Payer: Self-pay

## 2020-09-08 ENCOUNTER — Ambulatory Visit (HOSPITAL_COMMUNITY)
Admission: RE | Admit: 2020-09-08 | Discharge: 2020-09-08 | Disposition: A | Discharge status: Home / Self Care | Payer: Self-pay | Source: Ambulatory Visit | Attending: Family Medicine | Admitting: Family Medicine

## 2020-09-08 ENCOUNTER — Encounter (HOSPITAL_COMMUNITY): Payer: Self-pay

## 2020-09-08 VITALS — BP 104/76 | HR 72 | Wt 164.0 lb

## 2020-09-08 DIAGNOSIS — Z87891 Personal history of nicotine dependence: Secondary | ICD-10-CM | POA: Insufficient documentation

## 2020-09-08 DIAGNOSIS — I130 Hypertensive heart and chronic kidney disease with heart failure and stage 1 through stage 4 chronic kidney disease, or unspecified chronic kidney disease: Secondary | ICD-10-CM | POA: Insufficient documentation

## 2020-09-08 DIAGNOSIS — M1A9XX0 Chronic gout, unspecified, without tophus (tophi): Secondary | ICD-10-CM

## 2020-09-08 DIAGNOSIS — Z79899 Other long term (current) drug therapy: Secondary | ICD-10-CM | POA: Insufficient documentation

## 2020-09-08 DIAGNOSIS — I4892 Unspecified atrial flutter: Secondary | ICD-10-CM

## 2020-09-08 DIAGNOSIS — N1832 Chronic kidney disease, stage 3b: Secondary | ICD-10-CM | POA: Insufficient documentation

## 2020-09-08 DIAGNOSIS — I429 Cardiomyopathy, unspecified: Secondary | ICD-10-CM | POA: Insufficient documentation

## 2020-09-08 DIAGNOSIS — N183 Chronic kidney disease, stage 3 unspecified: Secondary | ICD-10-CM

## 2020-09-08 DIAGNOSIS — M109 Gout, unspecified: Secondary | ICD-10-CM | POA: Insufficient documentation

## 2020-09-08 DIAGNOSIS — Z7984 Long term (current) use of oral hypoglycemic drugs: Secondary | ICD-10-CM | POA: Insufficient documentation

## 2020-09-08 DIAGNOSIS — Z7902 Long term (current) use of antithrombotics/antiplatelets: Secondary | ICD-10-CM | POA: Insufficient documentation

## 2020-09-08 DIAGNOSIS — E1122 Type 2 diabetes mellitus with diabetic chronic kidney disease: Secondary | ICD-10-CM | POA: Insufficient documentation

## 2020-09-08 DIAGNOSIS — Z09 Encounter for follow-up examination after completed treatment for conditions other than malignant neoplasm: Secondary | ICD-10-CM | POA: Insufficient documentation

## 2020-09-08 DIAGNOSIS — R42 Dizziness and giddiness: Secondary | ICD-10-CM | POA: Insufficient documentation

## 2020-09-08 DIAGNOSIS — I2510 Atherosclerotic heart disease of native coronary artery without angina pectoris: Secondary | ICD-10-CM

## 2020-09-08 DIAGNOSIS — Z7901 Long term (current) use of anticoagulants: Secondary | ICD-10-CM | POA: Insufficient documentation

## 2020-09-08 DIAGNOSIS — J449 Chronic obstructive pulmonary disease, unspecified: Secondary | ICD-10-CM

## 2020-09-08 DIAGNOSIS — I5022 Chronic systolic (congestive) heart failure: Secondary | ICD-10-CM

## 2020-09-08 DIAGNOSIS — I252 Old myocardial infarction: Secondary | ICD-10-CM | POA: Insufficient documentation

## 2020-09-08 LAB — CBC
HCT: 38.5 % — ABNORMAL LOW (ref 39.0–52.0)
Hemoglobin: 12.2 g/dL — ABNORMAL LOW (ref 13.0–17.0)
MCH: 28.4 pg (ref 26.0–34.0)
MCHC: 31.7 g/dL (ref 30.0–36.0)
MCV: 89.5 fL (ref 80.0–100.0)
Platelets: 234 10*3/uL (ref 150–400)
RBC: 4.3 MIL/uL (ref 4.22–5.81)
RDW: 17.6 % — ABNORMAL HIGH (ref 11.5–15.5)
WBC: 6.8 10*3/uL (ref 4.0–10.5)
nRBC: 0 % (ref 0.0–0.2)

## 2020-09-08 LAB — BASIC METABOLIC PANEL
Anion gap: 11 (ref 5–15)
BUN: 18 mg/dL (ref 8–23)
CO2: 24 mmol/L (ref 22–32)
Calcium: 8.8 mg/dL — ABNORMAL LOW (ref 8.9–10.3)
Chloride: 105 mmol/L (ref 98–111)
Creatinine, Ser: 2.49 mg/dL — ABNORMAL HIGH (ref 0.61–1.24)
GFR, Estimated: 27 mL/min — ABNORMAL LOW (ref >60)
Glucose, Bld: 117 mg/dL — ABNORMAL HIGH (ref 70–99)
Potassium: 4.2 mmol/L (ref 3.5–5.1)
Sodium: 140 mmol/L (ref 135–145)

## 2020-09-08 NOTE — Patient Instructions (Addendum)
It was great to see you today! No medication changes are needed at this time.   Labs today We will only contact you if something comes back abnormal or we need to make some changes. Otherwise no news is good news!  Your physician recommends that you schedule a follow-up appointment in: 3-4 months  in the [facility] and in 2-3 months with Dr Aundra Dubin  Do the following things EVERYDAY: Weigh yourself in the morning before breakfast. Write it down and keep it in a log. Take your medicines as prescribed Eat low salt foods--Limit salt (sodium) to 2000 mg per day.  Stay as active as you can everyday Limit all fluids for the day to less than 2 liters  milAt the [facility], you and your health needs are our priority. As part of our continuing mission to provide you with exceptional heart care, we have created designated Provider Care Teams. These Care Teams include your primary Cardiologist (physician) and Advanced Practice Providers (APPs- Physician Assistants and Nurse Practitioners) who all work together to provide you with the care you need, when you need it.   You may see any of the following providers on your designated Care Team at your next follow up: Dr Glori Bickers Dr Loralie Champagne Dr Patrice Paradise, NP Lyda Jester, PA Allena Katz, NP Audry Riles, PharmD   Please be sure to bring in all your medications bottles to every appointment.

## 2020-09-10 ENCOUNTER — Telehealth: Payer: Self-pay | Admitting: Student

## 2020-09-10 NOTE — Telephone Encounter (Signed)
Attempted to contact patient to offer to schedule a Palliative Consult, with no answer.  Left voicemail requesting a return call to let us know if he wishes to pursue Palliative services or not, left my name and call back number.

## 2020-09-10 NOTE — Telephone Encounter (Signed)
Attempted to contact patient to schedule a Palliative Consult, no answer - left message with my name and call back number, requesting a return call.

## 2020-09-16 NOTE — Telephone Encounter (Signed)
Called [employer] to check the status of the patient's application. Representative stated that the patient needs to provide POI. Was not able to pull up documentation upon soft credit check.

## 2020-09-18 ENCOUNTER — Encounter (HOSPITAL_COMMUNITY): Payer: Self-pay | Admitting: Cardiology

## 2020-09-22 ENCOUNTER — Telehealth: Payer: Self-pay | Admitting: *Deleted

## 2020-09-24 ENCOUNTER — Telehealth: Payer: Self-pay | Admitting: Student

## 2020-09-24 NOTE — Telephone Encounter (Signed)
Attempted to contact patient and patient's significant other, to schedule a Palliative Consult, no answer - left message at both numbers requesting a return call.

## 2020-09-25 NOTE — Telephone Encounter (Signed)
[facility] Patient Advocate Encounter  Called and left the patient message regarding POI requirement for further processing of both assistance applications. Encouraged the patient to bring POI to the office or call back and I can provide fax number or mailing address.

## 2020-10-01 ENCOUNTER — Telehealth: Payer: Self-pay | Admitting: *Deleted

## 2020-10-03 ENCOUNTER — Telehealth: Payer: Self-pay | Admitting: Student

## 2020-10-03 NOTE — Telephone Encounter (Signed)
Spoke with patient regarding the Palliative referral/services and all questions were answered and he was in agreement with scheduling visit.  I have scheduled an In-home Consult for 10/14/20 @ 9 AM.

## 2020-10-13 ENCOUNTER — Telehealth: Payer: Self-pay | Admitting: Student

## 2020-10-13 ENCOUNTER — Inpatient Hospital Stay (HOSPITAL_COMMUNITY): Admission: RE | Admit: 2020-10-13 | Payer: Self-pay | Source: Ambulatory Visit

## 2020-10-13 NOTE — Telephone Encounter (Signed)
NP had called regarding palliative appointment that was scheduled for tomorrow. Appointment had been scheduled on 10/03/20. Karena Addison confirms that patient has been transported to [facility].

## 2020-10-14 ENCOUNTER — Other Ambulatory Visit: Payer: Self-pay | Admitting: Student

## 2020-12-02 DEATH — deceased

## 2020-12-11 ENCOUNTER — Encounter (HOSPITAL_COMMUNITY): Payer: Self-pay | Admitting: Cardiology

## 2021-02-03 ENCOUNTER — Inpatient Hospital Stay: Payer: Self-pay | Admitting: Legal Medicine

## 2021-08-18 ENCOUNTER — Other Ambulatory Visit (HOSPITAL_BASED_OUTPATIENT_CLINIC_OR_DEPARTMENT_OTHER): Payer: Self-pay

## 2021-11-30 IMAGING — US US RENAL
1 series · 14 of 25 positions shown · non-contrast
Comparison: None.

CLINICAL DATA: Acute kidney injury

EXAM:
RENAL / URINARY TRACT ULTRASOUND COMPLETE

[Series 1: us renal · 14 of 49 slices shown]
[im 1/49]
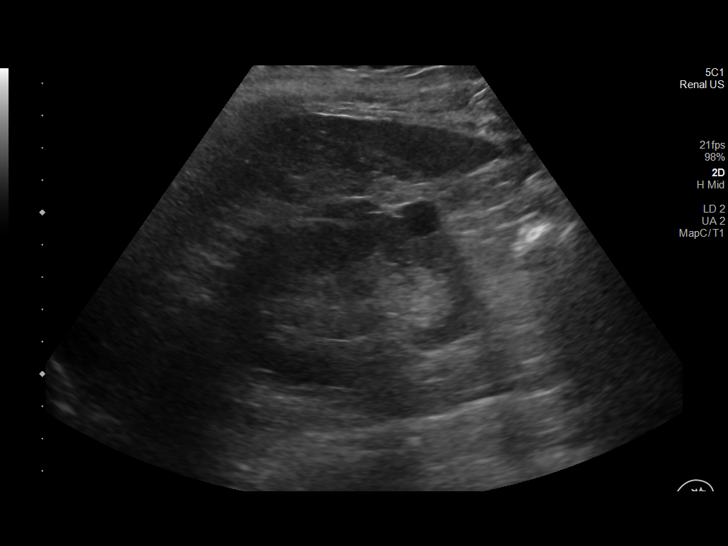
[im 5/49]
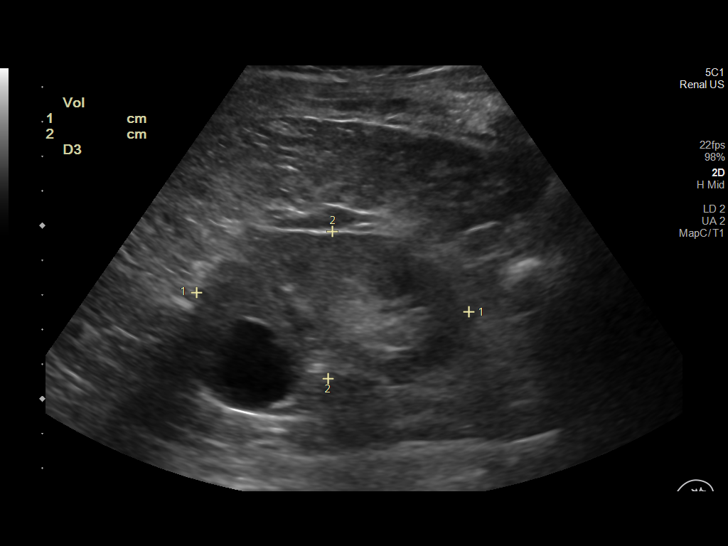
[im 9/49]
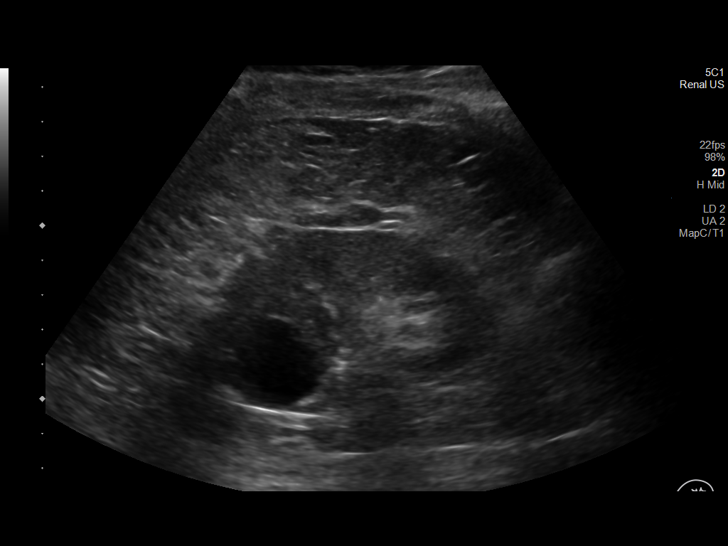
[im 13/49]
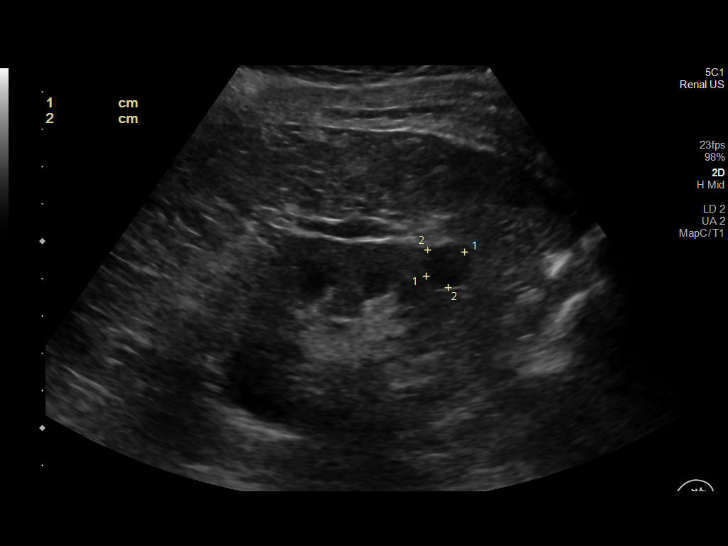
[im 17/49]
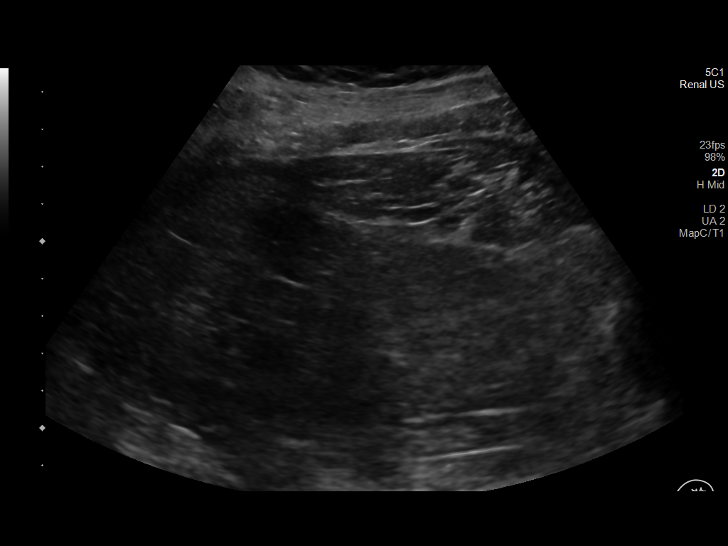
[im 19/49]
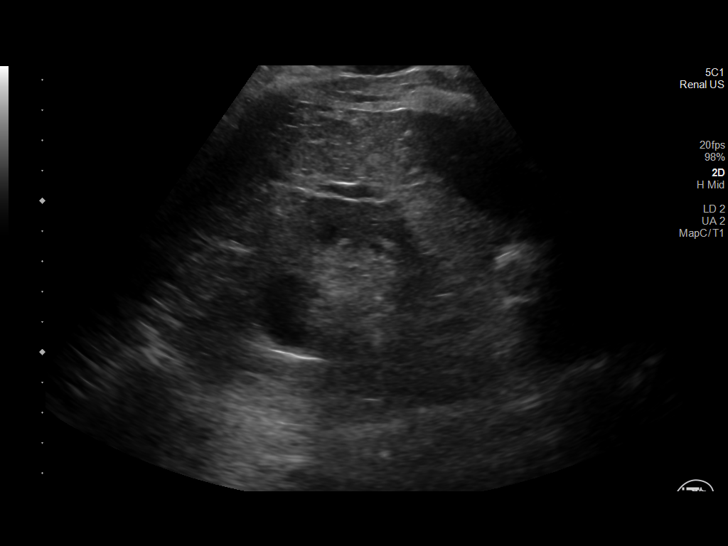
[im 23/49]
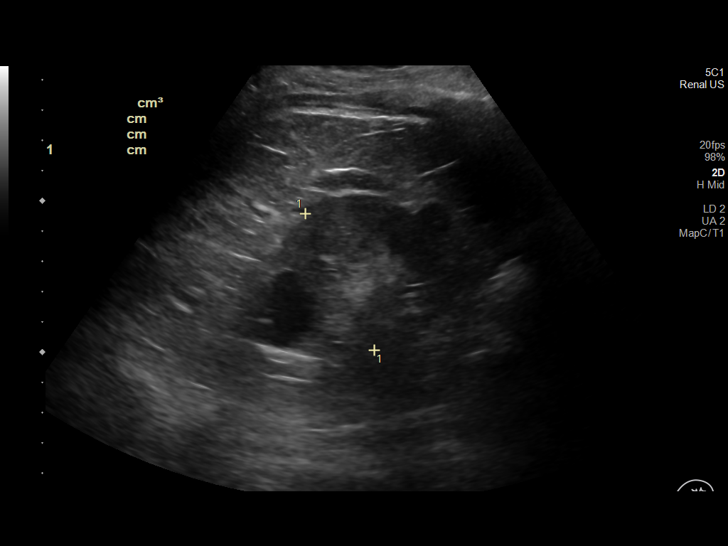
[im 27/49]
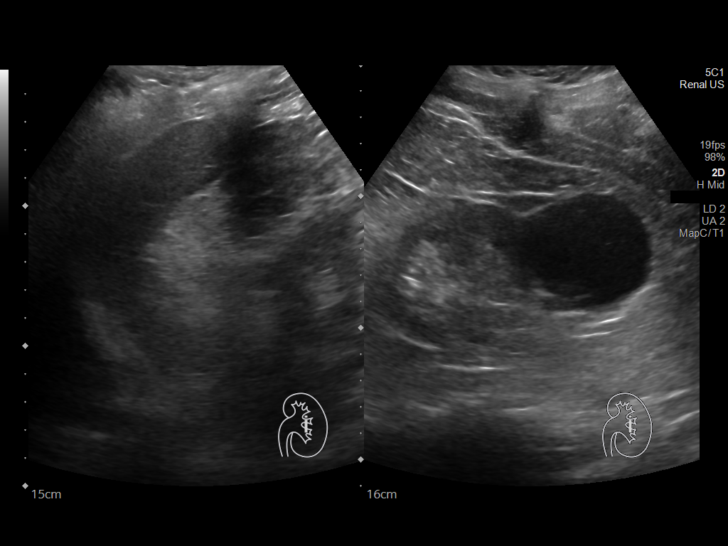
[im 31/49]
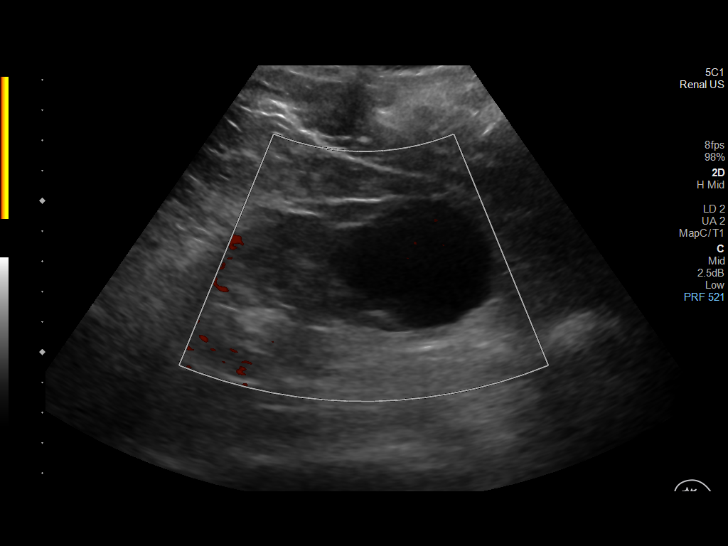
[im 33/49]
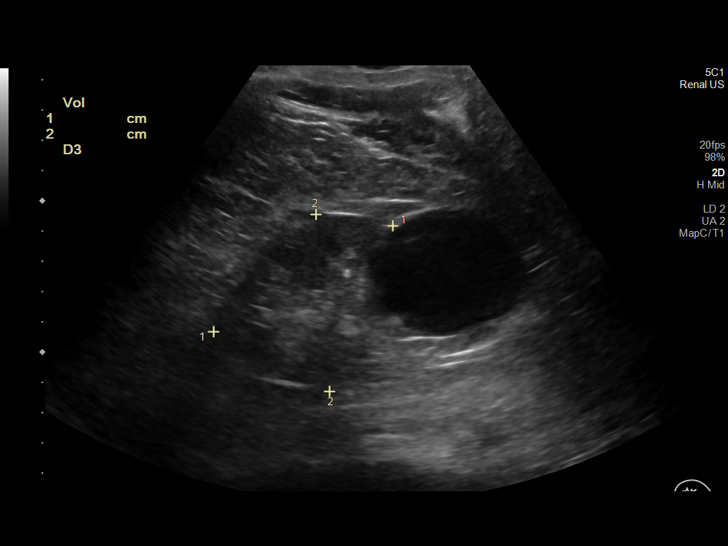
[im 37/49]
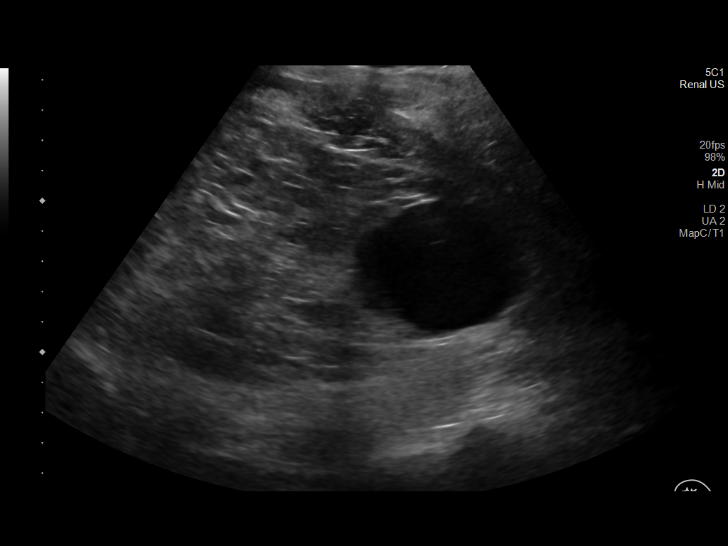
[im 41/49]
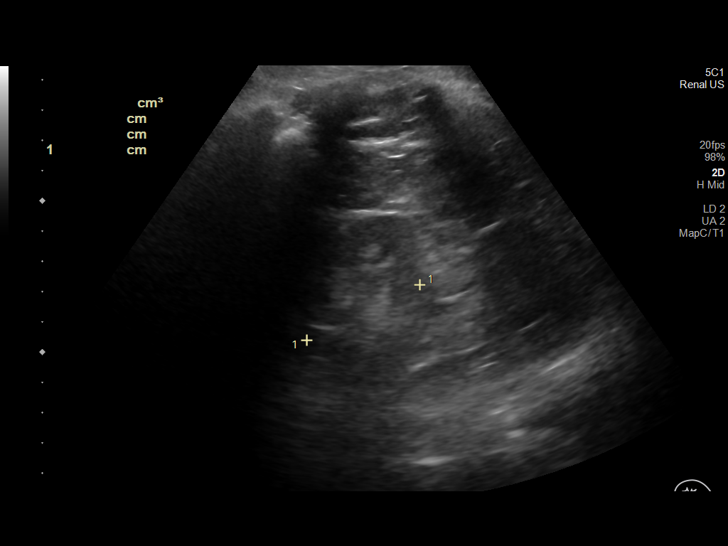
[im 45/49]
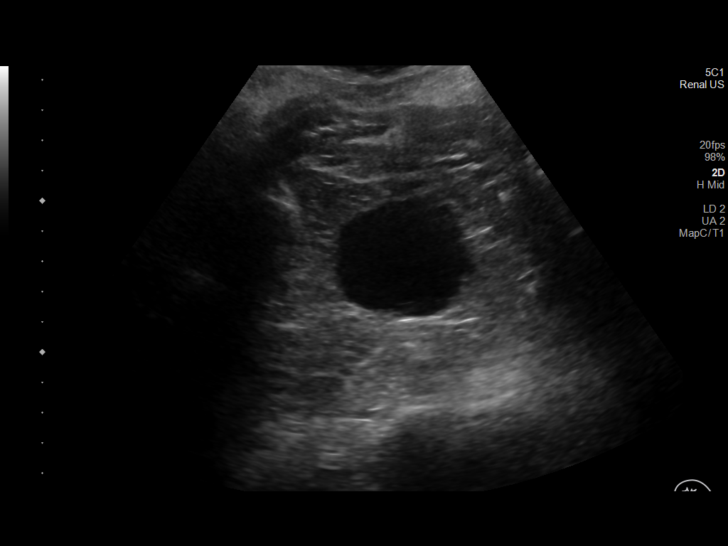
[im 49/49]
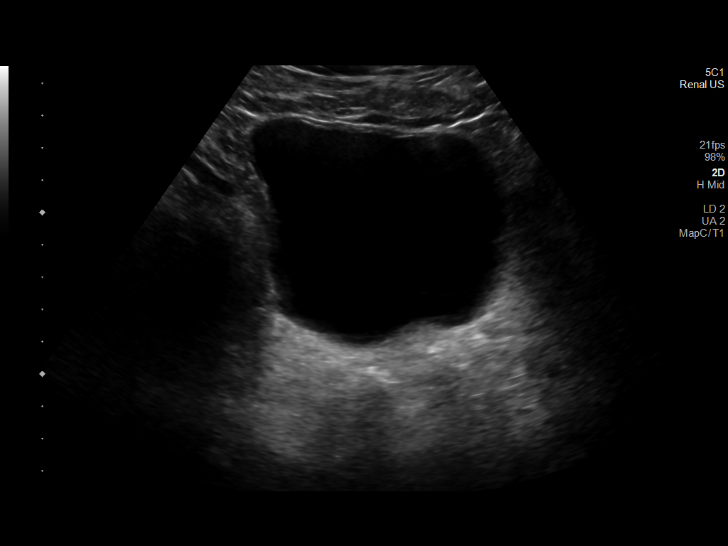

[14 of 25 positions shown; findings below may reference images not displayed]

FINDINGS: Right Kidney:

Renal measurements: 7.9 x 4.3 x 5.1 cm = volume: 88.6 mL.
Echogenicity within normal limits. No hydronephrosis. Cyst in the
upper pole measuring 2.8 x 2.5 x 2.2 cm. Cyst in the lower pole
measuring 1.2 x 1.5 x 1 cm.

Left Kidney:

Renal measurements: 6.9 x 5.9 x 4.2 cm = volume: 88 mL. Suspect
length measurement under estimated due to presence of lower pole
cyst. Echogenicity within normal limits. No hydronephrosis. Cyst at
the lower pole measuring 4.3 x 4.3 x 4.8 cm.

Bladder:

Appears normal for degree of bladder distention.

Other:

None.
IMPRESSION: 1. Negative for hydronephrosis.
2. Simple appearing bilateral renal cysts
